# Patient Record
Sex: Female | Born: 1975 | Race: White | Hispanic: No | Marital: Single | Smoking: Current every day smoker
Health system: Southern US, Community
[De-identification: ages and names within clinical notes are randomized; demographics above are authoritative.]

## PROBLEM LIST (undated history)

## (undated) ENCOUNTER — Emergency Department (HOSPITAL_COMMUNITY): Payer: 59

## (undated) DIAGNOSIS — E079 Disorder of thyroid, unspecified: Secondary | ICD-10-CM

## (undated) DIAGNOSIS — B009 Herpesviral infection, unspecified: Secondary | ICD-10-CM

## (undated) DIAGNOSIS — E039 Hypothyroidism, unspecified: Secondary | ICD-10-CM

## (undated) DIAGNOSIS — F319 Bipolar disorder, unspecified: Secondary | ICD-10-CM

## (undated) DIAGNOSIS — F32A Depression, unspecified: Secondary | ICD-10-CM

## (undated) DIAGNOSIS — Z87891 Personal history of nicotine dependence: Secondary | ICD-10-CM

## (undated) DIAGNOSIS — F329 Major depressive disorder, single episode, unspecified: Secondary | ICD-10-CM

## (undated) DIAGNOSIS — F410 Panic disorder [episodic paroxysmal anxiety] without agoraphobia: Secondary | ICD-10-CM

## (undated) HISTORY — PX: OVARIAN CYST SURGERY: SHX726

## (undated) HISTORY — PX: ADENOIDECTOMY: SUR15

## (undated) HISTORY — PX: TONSILLECTOMY: SUR1361

## (undated) HISTORY — DX: Herpesviral infection, unspecified: B00.9

---

## 1997-10-19 ENCOUNTER — Other Ambulatory Visit: Admission: RE | Admit: 1997-10-19 | Discharge: 1997-10-19 | Payer: Self-pay | Admitting: Gynecology

## 1998-01-08 ENCOUNTER — Encounter: Payer: Self-pay | Admitting: Emergency Medicine

## 1998-01-08 ENCOUNTER — Emergency Department (HOSPITAL_COMMUNITY): Admission: EM | Admit: 1998-01-08 | Discharge: 1998-01-08 | Payer: Self-pay | Admitting: Emergency Medicine

## 1998-06-04 ENCOUNTER — Encounter: Admission: RE | Admit: 1998-06-04 | Discharge: 1998-06-04 | Payer: Self-pay | Admitting: Family Medicine

## 1998-09-15 ENCOUNTER — Emergency Department (HOSPITAL_COMMUNITY): Admission: EM | Admit: 1998-09-15 | Discharge: 1998-09-15 | Payer: Self-pay | Admitting: Emergency Medicine

## 1998-11-07 ENCOUNTER — Other Ambulatory Visit: Admission: RE | Admit: 1998-11-07 | Discharge: 1998-11-07 | Payer: Self-pay | Admitting: Gynecology

## 1999-02-22 ENCOUNTER — Encounter: Admission: RE | Admit: 1999-02-22 | Discharge: 1999-02-22 | Payer: Self-pay | Admitting: Family Medicine

## 1999-04-02 ENCOUNTER — Encounter: Admission: RE | Admit: 1999-04-02 | Discharge: 1999-04-02 | Payer: Self-pay | Admitting: Sports Medicine

## 1999-04-09 ENCOUNTER — Encounter: Admission: RE | Admit: 1999-04-09 | Discharge: 1999-04-09 | Payer: Self-pay | Admitting: Sports Medicine

## 1999-04-22 ENCOUNTER — Encounter: Admission: RE | Admit: 1999-04-22 | Discharge: 1999-04-22 | Payer: Self-pay | Admitting: Family Medicine

## 2000-03-11 ENCOUNTER — Encounter: Admission: RE | Admit: 2000-03-11 | Discharge: 2000-03-11 | Payer: Self-pay | Admitting: Family Medicine

## 2000-09-04 ENCOUNTER — Encounter: Admission: RE | Admit: 2000-09-04 | Discharge: 2000-09-04 | Payer: Self-pay | Admitting: Family Medicine

## 2001-04-09 ENCOUNTER — Encounter: Admission: RE | Admit: 2001-04-09 | Discharge: 2001-04-09 | Payer: Self-pay | Admitting: Family Medicine

## 2001-05-06 ENCOUNTER — Encounter: Admission: RE | Admit: 2001-05-06 | Discharge: 2001-05-06 | Payer: Self-pay | Admitting: Family Medicine

## 2001-05-14 ENCOUNTER — Ambulatory Visit (HOSPITAL_COMMUNITY): Admission: RE | Admit: 2001-05-14 | Discharge: 2001-05-14 | Payer: Self-pay | Admitting: Family Medicine

## 2001-05-27 ENCOUNTER — Encounter: Admission: RE | Admit: 2001-05-27 | Discharge: 2001-05-27 | Payer: Self-pay | Admitting: Family Medicine

## 2001-06-08 ENCOUNTER — Encounter: Admission: RE | Admit: 2001-06-08 | Discharge: 2001-06-08 | Payer: Self-pay | Admitting: Sports Medicine

## 2001-06-08 ENCOUNTER — Emergency Department (HOSPITAL_COMMUNITY): Admission: EM | Admit: 2001-06-08 | Discharge: 2001-06-08 | Payer: Self-pay | Admitting: Emergency Medicine

## 2001-06-28 ENCOUNTER — Inpatient Hospital Stay (HOSPITAL_COMMUNITY): Admission: AD | Admit: 2001-06-28 | Discharge: 2001-06-28 | Payer: Self-pay | Admitting: *Deleted

## 2001-06-29 ENCOUNTER — Ambulatory Visit (HOSPITAL_COMMUNITY): Admission: RE | Admit: 2001-06-29 | Discharge: 2001-06-29 | Payer: Self-pay | Admitting: Sports Medicine

## 2001-07-27 ENCOUNTER — Encounter: Admission: RE | Admit: 2001-07-27 | Discharge: 2001-07-27 | Payer: Self-pay | Admitting: Family Medicine

## 2001-08-11 ENCOUNTER — Encounter: Admission: RE | Admit: 2001-08-11 | Discharge: 2001-08-11 | Payer: Self-pay | Admitting: Sports Medicine

## 2001-08-18 ENCOUNTER — Encounter: Admission: RE | Admit: 2001-08-18 | Discharge: 2001-08-18 | Payer: Self-pay | Admitting: Family Medicine

## 2001-09-01 ENCOUNTER — Encounter: Admission: RE | Admit: 2001-09-01 | Discharge: 2001-09-01 | Payer: Self-pay | Admitting: Family Medicine

## 2001-09-17 ENCOUNTER — Encounter: Admission: RE | Admit: 2001-09-17 | Discharge: 2001-09-17 | Payer: Self-pay | Admitting: Family Medicine

## 2001-09-29 ENCOUNTER — Inpatient Hospital Stay (HOSPITAL_COMMUNITY): Admission: AD | Admit: 2001-09-29 | Discharge: 2001-09-29 | Payer: Self-pay | Admitting: Obstetrics and Gynecology

## 2001-09-29 ENCOUNTER — Encounter: Admission: RE | Admit: 2001-09-29 | Discharge: 2001-09-29 | Payer: Self-pay | Admitting: Family Medicine

## 2001-10-14 ENCOUNTER — Encounter: Admission: RE | Admit: 2001-10-14 | Discharge: 2001-10-14 | Payer: Self-pay | Admitting: Family Medicine

## 2001-10-19 ENCOUNTER — Encounter: Admission: RE | Admit: 2001-10-19 | Discharge: 2001-10-19 | Payer: Self-pay | Admitting: Family Medicine

## 2001-10-29 ENCOUNTER — Encounter: Admission: RE | Admit: 2001-10-29 | Discharge: 2001-10-29 | Payer: Self-pay | Admitting: Family Medicine

## 2001-11-08 ENCOUNTER — Encounter: Admission: RE | Admit: 2001-11-08 | Discharge: 2001-11-08 | Payer: Self-pay | Admitting: Family Medicine

## 2001-11-18 ENCOUNTER — Inpatient Hospital Stay (HOSPITAL_COMMUNITY): Admission: AD | Admit: 2001-11-18 | Discharge: 2001-11-18 | Payer: Self-pay | Admitting: *Deleted

## 2001-11-18 ENCOUNTER — Encounter: Admission: RE | Admit: 2001-11-18 | Discharge: 2001-11-18 | Payer: Self-pay | Admitting: Sports Medicine

## 2001-11-22 ENCOUNTER — Inpatient Hospital Stay (HOSPITAL_COMMUNITY): Admission: RE | Admit: 2001-11-22 | Discharge: 2001-11-24 | Payer: Self-pay | Admitting: *Deleted

## 2001-11-29 ENCOUNTER — Encounter: Admission: RE | Admit: 2001-11-29 | Discharge: 2001-11-29 | Payer: Self-pay | Admitting: Sports Medicine

## 2001-12-28 ENCOUNTER — Encounter: Admission: RE | Admit: 2001-12-28 | Discharge: 2001-12-28 | Payer: Self-pay | Admitting: Family Medicine

## 2002-04-04 ENCOUNTER — Encounter: Admission: RE | Admit: 2002-04-04 | Discharge: 2002-04-04 | Payer: Self-pay | Admitting: Family Medicine

## 2002-04-08 ENCOUNTER — Encounter: Payer: Self-pay | Admitting: Sports Medicine

## 2002-04-08 ENCOUNTER — Ambulatory Visit (HOSPITAL_COMMUNITY): Admission: RE | Admit: 2002-04-08 | Discharge: 2002-04-08 | Payer: Self-pay | Admitting: Sports Medicine

## 2002-04-26 ENCOUNTER — Encounter: Admission: RE | Admit: 2002-04-26 | Discharge: 2002-04-26 | Payer: Self-pay | Admitting: Obstetrics and Gynecology

## 2002-04-28 ENCOUNTER — Ambulatory Visit (HOSPITAL_COMMUNITY): Admission: RE | Admit: 2002-04-28 | Discharge: 2002-04-28 | Payer: Self-pay | Admitting: *Deleted

## 2002-06-02 ENCOUNTER — Encounter (INDEPENDENT_AMBULATORY_CARE_PROVIDER_SITE_OTHER): Payer: Self-pay | Admitting: *Deleted

## 2002-06-02 ENCOUNTER — Inpatient Hospital Stay (HOSPITAL_COMMUNITY): Admission: RE | Admit: 2002-06-02 | Discharge: 2002-06-04 | Payer: Self-pay | Admitting: Obstetrics and Gynecology

## 2002-06-06 ENCOUNTER — Inpatient Hospital Stay (HOSPITAL_COMMUNITY): Admission: AD | Admit: 2002-06-06 | Discharge: 2002-06-06 | Payer: Self-pay | Admitting: Obstetrics and Gynecology

## 2002-07-07 ENCOUNTER — Encounter: Admission: RE | Admit: 2002-07-07 | Discharge: 2002-07-07 | Payer: Self-pay | Admitting: Obstetrics and Gynecology

## 2003-05-04 ENCOUNTER — Encounter: Admission: RE | Admit: 2003-05-04 | Discharge: 2003-05-04 | Payer: Self-pay | Admitting: Sports Medicine

## 2003-05-15 ENCOUNTER — Ambulatory Visit (HOSPITAL_COMMUNITY): Admission: RE | Admit: 2003-05-15 | Discharge: 2003-05-15 | Payer: Self-pay | Admitting: *Deleted

## 2003-05-15 ENCOUNTER — Ambulatory Visit (HOSPITAL_BASED_OUTPATIENT_CLINIC_OR_DEPARTMENT_OTHER): Admission: RE | Admit: 2003-05-15 | Discharge: 2003-05-15 | Payer: Self-pay | Admitting: *Deleted

## 2003-05-15 ENCOUNTER — Encounter (INDEPENDENT_AMBULATORY_CARE_PROVIDER_SITE_OTHER): Payer: Self-pay | Admitting: *Deleted

## 2003-05-19 ENCOUNTER — Emergency Department (HOSPITAL_COMMUNITY): Admission: EM | Admit: 2003-05-19 | Discharge: 2003-05-20 | Payer: Self-pay

## 2003-05-23 ENCOUNTER — Emergency Department (HOSPITAL_COMMUNITY): Admission: EM | Admit: 2003-05-23 | Discharge: 2003-05-24 | Payer: Self-pay | Admitting: Emergency Medicine

## 2003-05-24 ENCOUNTER — Encounter: Admission: RE | Admit: 2003-05-24 | Discharge: 2003-05-24 | Payer: Self-pay | Admitting: Family Medicine

## 2003-06-12 ENCOUNTER — Encounter: Admission: RE | Admit: 2003-06-12 | Discharge: 2003-06-12 | Payer: Self-pay | Admitting: Family Medicine

## 2003-06-14 ENCOUNTER — Encounter: Admission: RE | Admit: 2003-06-14 | Discharge: 2003-06-14 | Payer: Self-pay | Admitting: Sports Medicine

## 2003-07-06 ENCOUNTER — Encounter: Admission: RE | Admit: 2003-07-06 | Discharge: 2003-07-06 | Payer: Self-pay | Admitting: Family Medicine

## 2003-07-31 ENCOUNTER — Encounter: Admission: RE | Admit: 2003-07-31 | Discharge: 2003-07-31 | Payer: Self-pay | Admitting: Sports Medicine

## 2003-08-02 ENCOUNTER — Encounter: Admission: RE | Admit: 2003-08-02 | Discharge: 2003-08-02 | Payer: Self-pay | Admitting: Sports Medicine

## 2003-08-16 ENCOUNTER — Encounter: Admission: RE | Admit: 2003-08-16 | Discharge: 2003-08-16 | Payer: Self-pay | Admitting: Family Medicine

## 2003-09-19 ENCOUNTER — Encounter: Admission: RE | Admit: 2003-09-19 | Discharge: 2003-09-19 | Payer: Self-pay | Admitting: Family Medicine

## 2004-02-08 ENCOUNTER — Ambulatory Visit: Payer: Self-pay | Admitting: Family Medicine

## 2004-07-03 ENCOUNTER — Emergency Department (HOSPITAL_COMMUNITY): Admission: EM | Admit: 2004-07-03 | Discharge: 2004-07-03 | Payer: Self-pay | Admitting: Emergency Medicine

## 2004-07-04 ENCOUNTER — Ambulatory Visit (HOSPITAL_COMMUNITY): Admission: RE | Admit: 2004-07-04 | Discharge: 2004-07-04 | Payer: Self-pay | Admitting: Emergency Medicine

## 2004-07-09 ENCOUNTER — Encounter (INDEPENDENT_AMBULATORY_CARE_PROVIDER_SITE_OTHER): Payer: Self-pay | Admitting: *Deleted

## 2004-07-09 ENCOUNTER — Ambulatory Visit: Payer: Self-pay | Admitting: Family Medicine

## 2004-07-09 LAB — CONVERTED CEMR LAB

## 2004-07-18 ENCOUNTER — Ambulatory Visit: Payer: Self-pay | Admitting: Obstetrics & Gynecology

## 2004-08-02 ENCOUNTER — Ambulatory Visit: Payer: Self-pay | Admitting: Sports Medicine

## 2005-02-07 ENCOUNTER — Ambulatory Visit: Payer: Self-pay | Admitting: Family Medicine

## 2005-04-14 ENCOUNTER — Ambulatory Visit: Payer: Self-pay | Admitting: Sports Medicine

## 2005-10-15 ENCOUNTER — Ambulatory Visit: Payer: Self-pay | Admitting: Family Medicine

## 2005-10-21 ENCOUNTER — Encounter: Admission: RE | Admit: 2005-10-21 | Discharge: 2005-10-21 | Payer: Self-pay | Admitting: Sports Medicine

## 2005-10-22 ENCOUNTER — Ambulatory Visit: Payer: Self-pay | Admitting: Family Medicine

## 2005-11-06 ENCOUNTER — Ambulatory Visit: Payer: Self-pay | Admitting: Obstetrics & Gynecology

## 2005-11-06 ENCOUNTER — Encounter (INDEPENDENT_AMBULATORY_CARE_PROVIDER_SITE_OTHER): Payer: Self-pay | Admitting: Obstetrics & Gynecology

## 2005-11-22 ENCOUNTER — Emergency Department (HOSPITAL_COMMUNITY): Admission: EM | Admit: 2005-11-22 | Discharge: 2005-11-22 | Payer: Self-pay | Admitting: Emergency Medicine

## 2005-11-28 ENCOUNTER — Emergency Department (HOSPITAL_COMMUNITY): Admission: EM | Admit: 2005-11-28 | Discharge: 2005-11-28 | Payer: Self-pay | Admitting: Emergency Medicine

## 2006-04-02 DIAGNOSIS — J309 Allergic rhinitis, unspecified: Secondary | ICD-10-CM | POA: Insufficient documentation

## 2006-04-02 DIAGNOSIS — F17200 Nicotine dependence, unspecified, uncomplicated: Secondary | ICD-10-CM | POA: Insufficient documentation

## 2006-04-02 DIAGNOSIS — F319 Bipolar disorder, unspecified: Secondary | ICD-10-CM | POA: Insufficient documentation

## 2006-04-02 DIAGNOSIS — F312 Bipolar disorder, current episode manic severe with psychotic features: Secondary | ICD-10-CM | POA: Diagnosis present

## 2006-04-02 DIAGNOSIS — K219 Gastro-esophageal reflux disease without esophagitis: Secondary | ICD-10-CM | POA: Insufficient documentation

## 2006-04-02 DIAGNOSIS — N83209 Unspecified ovarian cyst, unspecified side: Secondary | ICD-10-CM | POA: Insufficient documentation

## 2006-04-02 DIAGNOSIS — F410 Panic disorder [episodic paroxysmal anxiety] without agoraphobia: Secondary | ICD-10-CM | POA: Insufficient documentation

## 2006-04-02 HISTORY — DX: Nicotine dependence, unspecified, uncomplicated: F17.200

## 2006-04-03 ENCOUNTER — Encounter (INDEPENDENT_AMBULATORY_CARE_PROVIDER_SITE_OTHER): Payer: Self-pay | Admitting: *Deleted

## 2006-08-05 ENCOUNTER — Encounter: Payer: Self-pay | Admitting: *Deleted

## 2006-11-11 ENCOUNTER — Encounter (INDEPENDENT_AMBULATORY_CARE_PROVIDER_SITE_OTHER): Payer: Self-pay | Admitting: *Deleted

## 2006-11-13 ENCOUNTER — Ambulatory Visit: Payer: Self-pay | Admitting: Family Medicine

## 2006-11-14 ENCOUNTER — Telehealth (INDEPENDENT_AMBULATORY_CARE_PROVIDER_SITE_OTHER): Payer: Self-pay | Admitting: Family Medicine

## 2006-12-25 ENCOUNTER — Emergency Department (HOSPITAL_COMMUNITY): Admission: EM | Admit: 2006-12-25 | Discharge: 2006-12-25 | Payer: Self-pay | Admitting: Family Medicine

## 2006-12-30 ENCOUNTER — Encounter (INDEPENDENT_AMBULATORY_CARE_PROVIDER_SITE_OTHER): Payer: Self-pay | Admitting: Family Medicine

## 2006-12-30 ENCOUNTER — Ambulatory Visit: Payer: Self-pay | Admitting: Family Medicine

## 2006-12-30 LAB — CONVERTED CEMR LAB
Chlamydia, DNA Probe: NEGATIVE
GC Probe Amp, Genital: NEGATIVE
Whiff Test: NEGATIVE

## 2007-01-04 ENCOUNTER — Emergency Department (HOSPITAL_COMMUNITY): Admission: EM | Admit: 2007-01-04 | Discharge: 2007-01-04 | Payer: Self-pay | Admitting: Family Medicine

## 2007-01-07 ENCOUNTER — Encounter (INDEPENDENT_AMBULATORY_CARE_PROVIDER_SITE_OTHER): Payer: Self-pay | Admitting: Family Medicine

## 2007-01-07 LAB — CONVERTED CEMR LAB: Pap Smear: NEGATIVE

## 2007-11-23 ENCOUNTER — Ambulatory Visit: Payer: Self-pay | Admitting: Family Medicine

## 2007-11-26 ENCOUNTER — Telehealth (INDEPENDENT_AMBULATORY_CARE_PROVIDER_SITE_OTHER): Payer: Self-pay | Admitting: *Deleted

## 2007-12-17 ENCOUNTER — Inpatient Hospital Stay (HOSPITAL_COMMUNITY): Admission: AD | Admit: 2007-12-17 | Discharge: 2007-12-22 | Payer: Self-pay | Admitting: Psychiatry

## 2007-12-17 ENCOUNTER — Ambulatory Visit: Payer: Self-pay | Admitting: Psychiatry

## 2008-01-11 ENCOUNTER — Emergency Department (HOSPITAL_COMMUNITY): Admission: EM | Admit: 2008-01-11 | Discharge: 2008-01-11 | Payer: Self-pay | Admitting: Emergency Medicine

## 2008-10-17 ENCOUNTER — Ambulatory Visit: Payer: Self-pay | Admitting: Family Medicine

## 2008-10-17 ENCOUNTER — Encounter: Payer: Self-pay | Admitting: Sports Medicine

## 2008-10-17 ENCOUNTER — Other Ambulatory Visit: Admission: RE | Admit: 2008-10-17 | Discharge: 2008-10-17 | Payer: Self-pay | Admitting: Sports Medicine

## 2008-10-17 LAB — CONVERTED CEMR LAB
ALT: 26 units/L (ref 0–35)
AST: 18 units/L (ref 0–37)
Albumin: 4.5 g/dL (ref 3.5–5.2)
Alkaline Phosphatase: 54 units/L (ref 39–117)
Amylase: 43 U/L (ref 0–105)
BUN: 9 mg/dL (ref 6–23)
Basophils Absolute: 0 10*3/uL (ref 0.0–0.1)
Basophils Relative: 0 % (ref 0–1)
Bilirubin Urine: NEGATIVE
Blood in Urine, dipstick: NEGATIVE
CO2: 21 meq/L (ref 19–32)
Calcium: 9.3 mg/dL (ref 8.4–10.5)
Chlamydia, DNA Probe: NEGATIVE
Chloride: 107 meq/L (ref 96–112)
Creatinine, Ser: 1 mg/dL (ref 0.40–1.20)
Eosinophils Absolute: 0.3 10*3/uL (ref 0.0–0.7)
Eosinophils Relative: 3 % (ref 0–5)
GC Probe Amp, Genital: NEGATIVE
Glucose, Bld: 83 mg/dL (ref 70–99)
Glucose, Urine, Semiquant: NEGATIVE
HCT: 39.3 % (ref 36.0–46.0)
Hemoglobin: 13.1 g/dL (ref 12.0–15.0)
Lipase: 25 U/L (ref 0–75)
Lymphocytes Relative: 41 % (ref 12–46)
Lymphs Abs: 4.2 10*3/uL — ABNORMAL HIGH (ref 0.7–4.0)
MCHC: 33.3 g/dL (ref 30.0–36.0)
MCV: 84.5 fL (ref 78.0–100.0)
Monocytes Absolute: 0.8 K/uL (ref 0.1–1.0)
Monocytes Relative: 8 % (ref 3–12)
Neutro Abs: 4.9 K/uL (ref 1.7–7.7)
Neutrophils Relative %: 48 % (ref 43–77)
Nitrite: NEGATIVE
Platelets: 302 10*3/uL (ref 150–400)
Potassium: 3.9 meq/L (ref 3.5–5.3)
Protein, U semiquant: 100
RBC: 4.65 M/uL (ref 3.87–5.11)
RDW: 12.8 % (ref 11.5–15.5)
Sodium: 142 meq/L (ref 135–145)
Specific Gravity, Urine: >=1.03
Total Bilirubin: 0.4 mg/dL (ref 0.3–1.2)
Total Protein: 7.5 g/dL (ref 6.0–8.3)
Urobilinogen, UA: 0.2
WBC: 10.2 10*3/uL (ref 4.0–10.5)
Whiff Test: NEGATIVE
pH: 6

## 2008-10-26 ENCOUNTER — Telehealth: Payer: Self-pay | Admitting: Sports Medicine

## 2008-11-21 ENCOUNTER — Ambulatory Visit: Payer: Self-pay | Admitting: Family Medicine

## 2008-12-06 ENCOUNTER — Emergency Department (HOSPITAL_COMMUNITY): Admission: EM | Admit: 2008-12-06 | Discharge: 2008-12-06 | Payer: Self-pay | Admitting: Family Medicine

## 2009-03-13 ENCOUNTER — Telehealth: Payer: Self-pay | Admitting: Sports Medicine

## 2009-05-31 ENCOUNTER — Ambulatory Visit: Payer: Self-pay | Admitting: Family Medicine

## 2009-11-29 ENCOUNTER — Ambulatory Visit: Payer: Self-pay | Admitting: Family Medicine

## 2009-11-29 ENCOUNTER — Other Ambulatory Visit: Admission: RE | Admit: 2009-11-29 | Discharge: 2009-11-29 | Payer: Self-pay | Admitting: Family Medicine

## 2009-11-29 DIAGNOSIS — D2330 Other benign neoplasm of skin of unspecified part of face: Secondary | ICD-10-CM | POA: Insufficient documentation

## 2009-11-29 LAB — CONVERTED CEMR LAB: Pap Smear: NEGATIVE

## 2009-12-10 ENCOUNTER — Encounter: Payer: Self-pay | Admitting: Sports Medicine

## 2009-12-17 ENCOUNTER — Ambulatory Visit: Payer: Self-pay | Admitting: Family Medicine

## 2009-12-17 ENCOUNTER — Encounter: Payer: Self-pay | Admitting: Sports Medicine

## 2009-12-17 LAB — CONVERTED CEMR LAB: Glucose, Bld: 82 mg/dL (ref 70–99)

## 2009-12-31 ENCOUNTER — Ambulatory Visit: Payer: Self-pay | Admitting: Family Medicine

## 2010-01-08 ENCOUNTER — Ambulatory Visit: Payer: Self-pay | Admitting: Family Medicine

## 2010-02-10 ENCOUNTER — Emergency Department (HOSPITAL_COMMUNITY)
Admission: EM | Admit: 2010-02-10 | Discharge: 2010-02-10 | Payer: Medicare Other | Source: Home / Self Care | Admitting: Emergency Medicine

## 2010-02-10 ENCOUNTER — Emergency Department (HOSPITAL_COMMUNITY)
Admission: EM | Admit: 2010-02-10 | Discharge: 2010-02-10 | Disposition: A | Discharge status: Other Acute Inpatient Hospital | Payer: Self-pay | Source: Home / Self Care | Admitting: Family Medicine

## 2010-02-18 LAB — COMPREHENSIVE METABOLIC PANEL
ALT: 15 U/L (ref 0–35)
AST: 16 U/L (ref 0–37)
Albumin: 3.7 g/dL (ref 3.5–5.2)
Alkaline Phosphatase: 51 U/L (ref 39–117)
BUN: 7 mg/dL (ref 6–23)
CO2: 24 mEq/L (ref 19–32)
Calcium: 8.6 mg/dL (ref 8.4–10.5)
Chloride: 104 mEq/L (ref 96–112)
Creatinine, Ser: 0.8 mg/dL (ref 0.4–1.2)
GFR calc Af Amer: >60 mL/min (ref >60)
GFR calc non Af Amer: >60 mL/min (ref >60)
Glucose, Bld: 85 mg/dL (ref 70–99)
Potassium: 3.7 mEq/L (ref 3.5–5.1)
Sodium: 136 mEq/L (ref 135–145)
Total Bilirubin: 0.4 mg/dL (ref 0.3–1.2)
Total Protein: 7 g/dL (ref 6.0–8.3)

## 2010-02-18 LAB — POCT URINALYSIS DIPSTICK
Bilirubin Urine: NEGATIVE
Nitrite: NEGATIVE
Protein, ur: NEGATIVE mg/dL
Specific Gravity, Urine: >=1.03 (ref 1.005–1.030)
Urine Glucose, Fasting: NEGATIVE mg/dL
Urobilinogen, UA: 0.2 mg/dL (ref 0.0–1.0)
pH: 5.5 (ref 5.0–8.0)

## 2010-02-18 LAB — DIFFERENTIAL
Basophils Absolute: 0 10*3/uL (ref 0.0–0.1)
Basophils Relative: 0 % (ref 0–1)
Eosinophils Absolute: 0.3 10*3/uL (ref 0.0–0.7)
Eosinophils Relative: 4 % (ref 0–5)
Lymphocytes Relative: 37 % (ref 12–46)
Lymphs Abs: 3.6 10*3/uL (ref 0.7–4.0)
Monocytes Absolute: 0.5 10*3/uL (ref 0.1–1.0)
Monocytes Relative: 5 % (ref 3–12)
Neutro Abs: 5.2 10*3/uL (ref 1.7–7.7)
Neutrophils Relative %: 54 % (ref 43–77)

## 2010-02-18 LAB — GC/CHLAMYDIA PROBE AMP, GENITAL
Chlamydia, DNA Probe: NEGATIVE
GC Probe Amp, Genital: NEGATIVE

## 2010-02-18 LAB — CBC
HCT: 37.3 % (ref 36.0–46.0)
Hemoglobin: 12.3 g/dL (ref 12.0–15.0)
MCH: 28.4 pg (ref 26.0–34.0)
MCHC: 33 g/dL (ref 30.0–36.0)
MCV: 86.1 fL (ref 78.0–100.0)
Platelets: 304 10*3/uL (ref 150–400)
RBC: 4.33 MIL/uL (ref 3.87–5.11)
RDW: 12.4 % (ref 11.5–15.5)
WBC: 9.7 10*3/uL (ref 4.0–10.5)

## 2010-02-18 LAB — LIPASE, BLOOD: Lipase: 25 U/L (ref 11–59)

## 2010-02-18 LAB — WET PREP, GENITAL
Clue Cells Wet Prep HPF POC: NONE SEEN
Trich, Wet Prep: NONE SEEN
Yeast Wet Prep HPF POC: NONE SEEN

## 2010-02-18 LAB — POCT PREGNANCY, URINE: Preg Test, Ur: NEGATIVE

## 2010-02-24 ENCOUNTER — Encounter: Payer: Self-pay | Admitting: *Deleted

## 2010-03-01 ENCOUNTER — Encounter: Payer: Self-pay | Admitting: Sports Medicine

## 2010-03-01 ENCOUNTER — Ambulatory Visit: Admission: RE | Admit: 2010-03-01 | Discharge: 2010-03-01 | Payer: Self-pay | Source: Home / Self Care

## 2010-03-01 LAB — CONVERTED CEMR LAB
Bilirubin Urine: NEGATIVE
Glucose, Urine, Semiquant: NEGATIVE
Ketones, urine, test strip: NEGATIVE
Nitrite: NEGATIVE
Protein, U semiquant: NEGATIVE
Specific Gravity, Urine: 1.025
Urobilinogen, UA: 0.2
WBC Urine, dipstick: NEGATIVE
pH: 6

## 2010-03-02 ENCOUNTER — Encounter: Payer: Self-pay | Admitting: Sports Medicine

## 2010-03-04 ENCOUNTER — Encounter
Admission: RE | Admit: 2010-03-04 | Discharge: 2010-03-04 | Payer: Self-pay | Source: Home / Self Care | Attending: Family Medicine | Admitting: Family Medicine

## 2010-03-05 NOTE — Assessment & Plan Note (Signed)
Summary: allergic rhinitis   Vital Signs:  Patient profile:   35 year old female Height:      64.6 inches Weight:      212.9 pounds BMI:     36.00 Temp:     98.3 degrees F oral Pulse rate:   92 / minute BP sitting:   144 / 82  (left arm) Cuff size:   large  Vitals Entered By: Gladstone Pih (May 31, 2009 1:39 PM) CC: C/O fever,diarrhea X 4 days Is Patient Diabetic? No Pain Assessment Patient in pain? no        Primary Care Provider:  Rodney Langton MD  CC:  C/O fever and diarrhea X 4 days.  History of Present Illness: 35yo F c/o fevers  Fever: x 4 days.  Highest temp 100.5.  Associated symptoms include seasonal allergy symptoms (e.g. itchy and watery eyes, sneezing and rhinorrhea).    Habits & Providers  Alcohol-Tobacco-Diet     Tobacco Status: current     Tobacco Counseling: to quit use of tobacco products     Cigarette Packs/Day: 0.5  Current Medications (verified): 1)  Caltrate 600+d Plus 600-400 Mg-Unit Tabs (Calcium Carbonate-Vit D-Min) .... Take 1 Tablet By Mouth Twice A Day  Allergies (verified): 1)  Penicillin  Review of Systems       No present N/V/diarrhea or rashes  Physical Exam  General:  VS Reviewed. Obese, non illl appearing, NAD.  Head:  no sinus ttp Eyes:  no injected conjuntiva or dischage Nose:  no discharge Mouth:  Oral mucosa and oropharynx without lesions or exudates.  Teeth in good repair. Neck:  supple, full ROM, no goiter or mass  Lungs:  Normal respiratory effort, chest expands symmetrically. Lungs are clear to auscultation, no crackles or wheezes. Heart:  Normal rate and regular rhythm. S1 and S2 normal without gallop, murmur, click, rub or other extra sounds. Skin:  no rashes or lesions nl color and turgor Psych:  pt very anxious and tangential during our concersation perseverating on other matters   Impression & Recommendations:  Problem # 1:  RHINITIS, ALLERGIC (ICD-477.9) Assessment Deteriorated  Pt afebrile  today and non ill appearing. Hx and exam more c/w seasonal allergies.  She is not currently on any medications. Advised to try OTC zyrtec and f/u with Dr. Karie Schwalbe as needed.  Orders: [facility]- Est Level  3 (16109)  Problem # 2:  BIPOLAR DISORDER (ICD-296.7) Assessment: Comment Only She has a very strange demeanor and affect.  She was not able to recall all the medications she is taking.  Her meds are being prescribed by a psychiatrist which she could not recall as well.  She has trouble focusing but does not seem manic at this time. Advised to f/u with Dr. Karie Schwalbe and bring all of her medications.  Complete Medication List: 1)  Caltrate 600+d Plus 600-400 Mg-unit Tabs (Calcium carbonate-vit d-min) .... Take 1 tablet by mouth twice a day  Patient Instructions: 1)  Go pick up some over the counter zyrtec and take it daily and set up an appt with Dr. Karie Schwalbe in the next few weeks and bring all of your medications.

## 2010-03-05 NOTE — Progress Notes (Signed)
Summary: triage  Phone Note Call from Patient Call back at Home Phone [phone]   Caller: Patient Summary of Call: Pt has really bad cough and coughing up stuff.  Asking to see if she can get some cough medicine called in.  Pt uses [facility] on [address]. Initial call taken by: Clydell Hakim,  March 13, 2009 2:26 PM  Follow-up for Phone Call        states she cannot get here as she has no ride. told her md would have to see here before rx meds. she can get a ride after we close & will go to UC Follow-up by: Golden Circle RN,  March 13, 2009 2:38 PM  Additional Follow-up for Phone Call Additional follow up Details #1::        Noted, to [facility] Additional Follow-up by: Rodney Langton MD,  March 13, 2009 3:06 PM

## 2010-03-05 NOTE — Assessment & Plan Note (Signed)
 Summary: cpe/pap,tcb   Vital Signs:  Patient profile:   35 year old female Height:      64.6 inches Weight:      230 pounds BMI:     38.89 Temp:     98.1 degrees F oral Pulse rate:   66 / minute BP sitting:   109 / 74  (right arm)  Vitals Entered By: Arland Morel (October 17, 2008 4:21 PM) CC: cpe/pap  ?uti Is Patient Diabetic? No Pain Assessment Patient in pain? yes     Location: lower back Intensity: 10 Type: throbbing   Primary Care Provider:  DEBBY PETTIES MD  CC:  cpe/pap  ?uti.  History of Present Illness: Veronica Perez here for CPE/PAP also with c/o dysuria  Dysuria:  several days now, associated with dysuria, frequency, urgency, minimal clear vaginal discharge.  She does complain of pelvic pain and nausea.  No fevers/chills, flank pain, diarrhea, constipation.  Thinks she may be close to menstruation.  Pt states recently separated from husband who she found to be cheating on her.  Very distraught.  No SI/HI.  Habits & Providers  Alcohol-Tobacco-Diet     Tobacco Status: current     Cigarette Packs/Day: 0.5  Past History:  Past Medical History: Last updated: 11/13/2006 07/2003 resolved L ovarian complex cyst,  possible, Bilateral large ovarian cysts 3/04 - s/p resection,  hydrosalpinix, new 2cm multiseptat.cyst on R ovary, Regular menses (5 days),  G1P1001 NSVD female infant 10/03,  GC/Chlam neg 6/06,  hospitalized [facility] for MANIA schizoaffective disorder  Past Surgical History: Last updated: 11/13/2006 and lower uterine segment - 06/19/2003, and small calcification junction cervix - 06/19/2003,  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 ETOH on weekends. Occasional THC. No other drugs. 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 No apparent alopecia or balding. Eyes:  No corneal or conjunctival inflammation noted. EOMI. Perrla. Ears:  External ear exam shows no significant lesions or deformities.   Nose:  External nasal examination shows no deformity or inflammation. Mouth:  Oral mucosa and oropharynx without lesions or exudates.  Teeth in good repair. Neck:  No deformities, masses, or tenderness noted. Chest Wall:  No deformities, masses, or tenderness noted. Breasts:  No mass, nodules, thickening, tenderness, bulging, retraction, inflamation, nipple discharge or skin changes noted.   Lungs:  Normal respiratory effort, chest expands symmetrically. Lungs are clear to auscultation, no crackles or wheezes. Heart:  Normal rate and regular rhythm. S1 and S2 normal without gallop, murmur, click, rub or other extra sounds. Abdomen:  Bowel sounds positive,abdomen soft and without masses, organomegaly or hernias noted.  TTP lower quadrants, no rebound. Genitalia:  Normal introitus for age, several genital warts noted, no vaginal discharge, mucosa pink and moist, no vaginal or cervical lesions, no vaginal  atrophy, no friaility or hemorrhage, normal uterus size and position.  Tender suprapubic, also tender in LLQ/RLQ.  Extremities:  No clubbing, cyanosis, edema, or deformity noted with normal full range of motion of all joints.   Skin:  Intact without suspicious  lesions or rashes Cervical Nodes:  No lymphadenopathy noted   Impression & Recommendations:  Problem # 1:  DYSURIA (ICD-788.1) Assessment New UA suggestive of UTI.  Will tx with 7d course keflex .  Awaiting UCx.  Had only a drop left to send for Cx.  Her updated medication list for this problem includes:    Keflex  500 Mg Caps (Cephalexin ) ..... One tab by mouth two times a day x 7 days  Orders: Urinalysis-[facility] (00000) CBC w/Diff-[facility] ([id number]) [facility]- Est Level  3 (00786) Urine Culture-[facility] ([id number])  Problem # 2:  NAUSEA (ICD-787.02) Assessment: New Dysuria also associated with nausea.  With large amt bili in urine will need to also check LFTs, amylase, lipase to r/o other intraabdominal pathology.   Orders: Comp Met-[facility] [id number]) CBC w/Diff-[facility] [phone]) Amylase-[facility] [phone]) Lipase-[facility] [id number]) [facility]- Est Level  3 (00786)  Problem # 3:  PELVIC PAIN 625.9 (ICD-625.9) Assessment: Deteriorated Seems she has had this before but no diagnosis.  Dysuria with pelvic pain also associated with nausea.  With large amt bili in urine will need to also check LFTs, amylase, lipase to r/o other intraabdominal pathology.  If all negative and still with pain after bx and TOC UA then will consider US  pelvis.  Orders: Comp Met-[facility] [id number]) CBC w/Diff-[facility] [id number]) Amylase-[facility] [id number]) Lipase-[facility] [id number]) [facility]- Est Level  3 (00786)  Complete Medication List: 1)  Caltrate 600+d Plus 600-400 Mg-unit Tabs (Calcium carbonate-vit d-min) .... Take 1 tablet by mouth twice a day 2)  Hydrocortisone  2.5 % Oint (Hydrocortisone ) .... Apply to affected area on hand 3 times a day 3)  Keflex  500 Mg Caps (Cephalexin ) .... One tab by mouth two times a day x 7 days  Other Orders: GC/Chlamydia-[facility] ([id number]) [facility] Pap Smear-[facility] ([id number])  Patient Instructions: 1)  Great to see you today, 2)  I will check some labwork. 3)  Some of the tests will take a few days  to come back. 4)  In the meantime, it looks like you have a UTI so I will put you on a course of antibiotics. 5)  I want you to come back to see me if you haven't had any improvement in 2 weeks, or if you start to have fevers/chills/vomiting/side pain. 6)  -Dr. ONEIDA. Prescriptions: KEFLEX  500 MG CAPS (CEPHALEXIN ) One tab by mouth two times a day x 7 days  #14 x 0   Entered and Authorized by:   Debby Petties MD   Signed by:   Debby Petties MD on 10/17/2008   Method used:   Electronically to        [facility]  [address] [facility] [id number] (retail)       [address]       [place], [place]  [address]       Ph: [phone] or [phone]       Fax: [phone]   RxID:   [id number]     Laboratory Results   Urine Tests  Date/Time Received: October 17, 2008 4:24 PM  Date/Time Reported: October 17, 2008 5:44 PM   Routine Urinalysis   Color: yellow Appearance: QNS to determine clarity Glucose: negative   (Normal Range: Negative) Bilirubin:  moderate;  reflex to  ictotest = negative   (Normal Range: Negative) Ketone: small (15)   (Normal Range: Negative) Spec. Gravity: >=1.030   (Normal Range: 1.003-1.035) Blood: negative   (Normal Range: Negative) pH: 6.0   (Normal Range: 5.0-8.0) Protein: 100   (Normal Range: Negative) Urobilinogen: 0.2   (Normal Range: 0-1) Nitrite: negative   (Normal Range: Negative) Leukocyte Esterace: small   (Normal Range: Negative)    Comments: QNS for microscopic,  urine cultured ...............test performed by......SABRABonnie A. Jordan, MT (ASCP)  Date/Time Received: October 17, 2008 4:24 PM  Date/Time Reported: October 17, 2008 5:42 PM   [place]et [place] Source: vag WBC/hpf: >20 Bacteria/hpf: 2+  Rods Clue cells/hpf: none  Negative whiff Yeast/hpf: none Trichomonas/hpf: none Comments: RBC's present ...............test performed by......SABRABonnie A. Jordan, MT (ASCP)

## 2010-03-05 NOTE — Miscellaneous (Signed)
  Clinical Lists Changes  Problems: Removed problem of SCREENING FOR MALIGNANT NEOPLASM OF THE CERVIX (ICD-V76.2) Removed problem of NEED PROPHYLACTIC VACCINATION&INOCULATION FLU (ICD-V04.81) Removed problem of ROUTINE GYNECOLOGICAL EXAMINATION (ICD-V72.31) Removed problem of VAGINAL DISCHARGE (ICD-623.5) Removed problem of SCREENING FOR MALIGNANT NEOPLASM OF THE CERVIX (ICD-V76.2) Removed problem of PELVIC PAIN 625.9 (ICD-625.9) Removed problem of MENSTRUATION, PAINFUL (ICD-625.3) Removed problem of DSORD BIPOLAR I SNGL MANIC EPISODE, UNSPC (ICD-296.00)

## 2010-03-05 NOTE — Assessment & Plan Note (Signed)
Summary: cpe,df   Vital Signs:  Patient profile:   35 year old female Height:      64.6 inches Weight:      222.5 pounds BMI:     37.62 Temp:     98.3 degrees F oral Pulse rate:   74 / minute BP sitting:   123 / 82  (left arm) Cuff size:   regular  Vitals Entered By: Garen Grams LPN (November 29, 2009 4:08 PM) CC: CPE Is Patient Diabetic? No Pain Assessment Patient in pain? no        Primary Care Bobak Oguinn:  Rodney Langton MD  CC:  CPE.  History of Present Illness: Here to CPE and PAP.  Noted mole on face she would like removed.  Habits & Providers  Alcohol-Tobacco-Diet     Tobacco Status: current     Tobacco Counseling: to quit use of tobacco products     Cigarette Packs/Day: 0.5  Current Medications (verified): 1)  Caltrate 600+d Plus 600-400 Mg-Unit Tabs (Calcium Carbonate-Vit D-Min) .... Take 1 Tablet By Mouth Twice A Day  Allergies (verified): 1)  Penicillin  Review of Systems       See HPI  Physical Exam  General:  Well-developed,well-nourished,in no acute distress; alert,appropriate and cooperative throughout examination Head:  Normocephalic and atraumatic without obvious abnormalities. No apparent alopecia or balding. Eyes:  No corneal or conjunctival inflammation noted. EOMI. Perrl Ears:  External ear exam shows no significant lesions or deformities.  Otoscopic examination reveals clear canals, tympanic membranes are intact bilaterally without bulging, retraction, inflammation or discharge. Hearing is grossly normal bilaterally. Nose:  External nasal examination shows no deformity or inflammation. Nasal mucosa are pink and moist without lesions or exudates. Mouth:  Oral mucosa and oropharynx without lesions or exudates.  Teeth in good repair. Neck:  No deformities, masses, or tenderness noted. Lungs:  Normal respiratory effort, chest expands symmetrically. Lungs are clear to auscultation, no crackles or wheezes. Heart:  Normal rate and regular  rhythm. S1 and S2 normal without gallop, murmur, click, rub or other extra sounds. Abdomen:  Bowel sounds positive,abdomen soft and non-tender without masses, organomegaly or hernias noted. Genitalia:  Normal introitus for age, no external lesions, no vaginal discharge, mucosa pink and moist, no vaginal or cervical lesions, no vaginal atrophy, no friaility or hemorrhage, normal uterus size and position, no adnexal masses or tenderness.  Starting menstruation. Msk:  No deformity or scoliosis noted of thoracic or lumbar spine.   Pulses:  R and L carotid,radial,femoral,dorsalis pedis and posterior tibial pulses are full and equal bilaterally Extremities:  No clubbing, cyanosis, edema, or deformity noted with normal full range of motion of all joints.   Neurologic:  No cranial nerve deficits noted. Station and gait are normal. Plantar reflexes are down-going bilaterally. DTRs are symmetrical throughout. Sensory, motor and coordinative functions appear intact. Skin:  0.5 cm nodule present on L cheek just lateral to nasolabial fold.   Impression & Recommendations:  Problem # 1:  ROUTINE GYNECOLOGICAL EXAMINATION (ICD-V72.31) Assessment Unchanged Normal exam. PAP done.  Orders: [facility] - Est  18-39 yrs (51884)  Problem # 2:  NEED PROPHYLACTIC VACCINATION&INOCULATION FLU (ICD-V04.81) Assessment: Unchanged Flu shot given.  Orders: [facility] - Est  18-39 yrs (16606)  Problem # 3:  SCREENING FOR MALIGNANT NEOPLASM OF THE CERVIX (ICD-V76.2) Assessment: Unchanged Pap done.  Orders: [facility] - Est  18-39 yrs (30160) Pap Smear-[facility] ([id number])  Problem # 4:  BENIGN NEOPLASM SKIN OTHER&UNSPEC PARTS FACE (ICD-216.3) Assessment: New Can remove  mole at next visit with shave and hyfrecator.  Complete Medication List: 1)  Caltrate 600+d Plus 600-400 Mg-unit Tabs (Calcium carbonate-vit d-min) .... Take 1 tablet by mouth twice a day  Other Orders: Flu Vaccine 56yrs + (60454) Admin 1st Vaccine  (09811)  Patient Instructions: 1)  Good to see you, 2)  Come back to discuss removal of moles on face. 3)  -Dr. Karie Schwalbe.   Orders Added: 1)  [facility] - Est  18-39 yrs [99395] 2)  Pap Smear-[facility] [[id number]] 3)  Flu Vaccine 3yrs + [90658] 4)  Admin 1st Vaccine [90471]   Immunizations Administered:  Influenza Vaccine # 1:    Vaccine Type: Fluvax 3+    Site: right deltoid    Mfr: GlaxoSmithKline    Dose: 0.5 ml    Route: IM    Given by: Garen Grams LPN    Exp. Date: 07/31/2010    Lot #: [id number]    VIS given: 08/28/09 version given November 29, 2009.  Flu Vaccine Consent Questions:    Do you have a history of severe allergic reactions to this vaccine? no    Any prior history of allergic reactions to egg and/or gelatin? no    Do you have a sensitivity to the preservative Thimersol? no    Do you have a past history of Guillan-Barre Syndrome? no    Do you currently have an acute febrile illness? no    Have you ever had a severe reaction to latex? no    Vaccine information given and explained to patient? yes    Are you currently pregnant? no   Immunizations Administered:  Influenza Vaccine # 1:    Vaccine Type: Fluvax 3+    Site: right deltoid    Mfr: GlaxoSmithKline    Dose: 0.5 ml    Route: IM    Given by: Garen Grams LPN    Exp. Date: 07/31/2010    Lot #: [id number]    VIS given: 08/28/09 version given November 29, 2009.

## 2010-03-05 NOTE — Progress Notes (Signed)
 Summary: test results  Phone Note Call from Patient Call back at [phone]   Caller: Patient Summary of Call: is wanting test results Initial call taken by: Karna Seminole,  October 26, 2008 4:00 PM  Follow-up for Phone Call        All results were negative including GC/Chlam, CBC, CMET, amylase, lipase, PAP, Urine culture showed UTI.  Finish course of Keflex .  Come back to see me 2 weeks after last visit if still symptomatic otherwise can RTC in a year. Follow-up by: Debby Petties MD,  October 26, 2008 5:33 PM     Appended Document: test results her number has been d/c. called other number & left message with her dad to call me back  Appended Document: test results told her what md notes said about results of tests. states her uti is better & she took all the med

## 2010-03-05 NOTE — Assessment & Plan Note (Signed)
Summary: F/U  KH   Vital Signs:  Patient profile:   35 year old female Weight:      215 pounds Temp:     98 degrees F oral Pulse rate:   98 / minute Pulse rhythm:   regular BP sitting:   131 / 86  (left arm) Cuff size:   large  Vitals Entered By: Loralee Pacas CMA (January 08, 2010 9:09 AM)  Primary Care Hartwell Vandiver:  Rodney Langton MD   History of Present Illness: Pt here to fu removal of mole from face.  She is pleased with the result and has not had any pain.  Scab came off but otherwise no erythema, no pain, no drainage.  Current Medications (verified): 1)  Caltrate 600+d Plus 600-400 Mg-Unit Tabs (Calcium Carbonate-Vit D-Min) .... Take 1 Tablet By Mouth Twice A Day  Allergies (verified): 1)  Penicillin  Review of Systems       See HPI  Physical Exam  General:  Well-developed,well-nourished,in no acute distress; alert,appropriate and cooperative throughout examination Skin:  Area where mole removed healing well, mildly hyperemic but no warmth, induration, drainage.  Excellent cosmetic result.   Impression & Recommendations:  Problem # 1:  BENIGN NEOPLASM SKIN OTHER&UNSPEC PARTS FACE (ICD-216.3) Assessment Improved  Excellent cosmesis, pt very satisfied. Pt to RTC as needed.  Orders: [facility]- Est Level  3 (99213)  Complete Medication List: 1)  Caltrate 600+d Plus 600-400 Mg-unit Tabs (Calcium carbonate-vit d-min) .... Take 1 tablet by mouth twice a day   Orders Added: 1)  [facility]- Est Level  3 [11914]

## 2010-03-05 NOTE — Assessment & Plan Note (Signed)
Summary: MOLE REMOVAL/KH   Vital Signs:  Patient profile:   35 year old female Height:      64.6 inches Weight:      226 pounds BMI:     38.21 Temp:     98.6 degrees F oral Pulse rate:   73 / minute BP sitting:   118 / 76  (right arm) Cuff size:   large  Vitals Entered By: Jimmy Footman, CMA (December 17, 2009 11:13 AM) CC: mole removal/ face Is Patient Diabetic? No Pain Assessment Patient in pain? no        Primary Care Knight Oelkers:  Rodney Langton MD  CC:  mole removal/ face.  History of Present Illness: 35 yo female comes in fot removal of mole on face.  Also would like to have blood glucose checked. Endorses fatigue.  Habits & Providers  Alcohol-Tobacco-Diet     Tobacco Status: current  Current Medications (verified): 1)  Caltrate 600+d Plus 600-400 Mg-Unit Tabs (Calcium Carbonate-Vit D-Min) .... Take 1 Tablet By Mouth Twice A Day  Allergies (verified): 1)  Penicillin  Review of Systems       See HPI  Physical Exam  General:  Well-developed,well-nourished,in no acute distress; alert,appropriate and cooperative throughout examination Additional Exam:  Procedure: removal of mole on left cheek, just lateral to nasolabial fold. Consent obtained Timeout conducted. 1cc of lidocaine with epi infiltrated under mole. Adequate anesthesia ensured. Area cleaned with alcohol.  Under magnification with loupes, forceps used to lift lesion and #11 blade used to shave off lesion at skin level. Hyfrecator used on setting 4 to cauterize wound and smooth edges.  Hemostasis achieved Antibiotic ointment applied and bandaid placed . Red flags advised.   Impression & Recommendations:  Problem # 1:  BENIGN NEOPLASM SKIN OTHER&UNSPEC PARTS FACE (ICD-216.3) Assessment Improved Removed. lesion. Smoothed edges with hyfrecator. Pt to RTC 1 week to recheck wound.  Orders: Cryotherapy/Destruction benign or premalignant lesion (1st lesion)  (17000)  Problem # 2:  FATIGUE  (ICD-780.79) Assessment: New Checking CBG as requested by pt.  Orders: [facility]- Est Level  3 (99213) Glucose-[facility] ([id number])  Complete Medication List: 1)  Caltrate 600+d Plus 600-400 Mg-unit Tabs (Calcium carbonate-vit d-min) .... Take 1 tablet by mouth twice a day   Orders Added: 1)  Cryotherapy/Destruction benign or premalignant lesion (1st lesion)  [17000] 2)  [facility]- Est Level  3 [99213] 3)  Glucose-[facility] [[id number]]  Appended Document: MOLE REMOVAL/KH    Clinical Lists Changes  Orders: Added new Service order of Cryo/Destruc ben lesion, not skin tag or vascular; <14 lesions (17110) - Signed

## 2010-03-05 NOTE — Assessment & Plan Note (Signed)
Summary: fu remov/mj   Vitals Entered By: Loralee Pacas CMA (December 31, 2009 8:56 AM)  Primary Care Provider:  Rodney Langton MD   History of Present Illness: Pt here to fu removal of mole from face.  She is pleased with the result and has not had any pain.  She would like for me to remove some more.  Current Medications (verified): 1)  Caltrate 600+d Plus 600-400 Mg-Unit Tabs (Calcium Carbonate-Vit D-Min) .... Take 1 Tablet By Mouth Twice A Day  Allergies (verified): 1)  Penicillin  Review of Systems       See HPI  Physical Exam  General:  Well-developed,well-nourished,in no acute distress; alert,appropriate and cooperative throughout examination Skin:  0.5 cm nodule, flatter than previous visit,  present on L cheek just lateral to nasolabial fold. Additional Exam:  Procedure: repear removal of remnants of mole on left cheek, just lateral to nasolabial fold. Verbal consent obtained Timeout conducted. 1cc of lidocaine with epi infiltrated under mole. Adequate anesthesia ensured. Area cleaned with alcohol.  Under magnification with loupes, forceps used to lift lesion and #11 blade used to shave off lesion at skin level. Hyfrecator used on setting 4 to cauterize wound and smooth edges.  Hemostasis achieved Antibiotic ointment applied and bandaid placed . Red flags advised.   Impression & Recommendations:  Problem # 1:  BENIGN NEOPLASM SKIN OTHER&UNSPEC PARTS FACE (ICD-216.3) Assessment Improved Re-excised remnants of lesion. Smoothed edges with hyfrecator. Pt to RTC 1 week to recheck wound.  Complete Medication List: 1)  Caltrate 600+d Plus 600-400 Mg-unit Tabs (Calcium carbonate-vit d-min) .... Take 1 tablet by mouth twice a day

## 2010-03-05 NOTE — Assessment & Plan Note (Signed)
 Summary: cough, shortness of breath/ACM   Vital Signs:  Patient Profile:   35 Years Old Female Height:     64 inches Weight:      203 pounds O2 Sat:      98 % O2 treatment:    Room Air Temp:     98.1 degrees F Pulse rate:   91 / minute BP sitting:   116 / 77  Vitals Entered By: HARLENE CARTE CMA (November 23, 2007 2:15 PM)                  PCP:  DEBBY PETTIES MD  Chief Complaint:  cough and SOB.  History of Present Illness: 3 day history of non-productive cough, SOB, runny nose, sore throat, pleuritic pain in ribs, temperature to 100 deg F at home.  Also has some body aches, and muscle pains.  No HA, sinus pressure, N/V/D/C, dizziness, lightheadedness, syncope.     Past Medical History:    Reviewed history from 11/13/2006 and no changes required:       07/2003 resolved L ovarian complex cyst,        possible, Bilateral large ovarian cysts 3/04 - s/p resection,  hydrosalpinix, new 2cm multiseptat.cyst on R ovary, Regular menses (5 days),        G1P1001 NSVD female infant 10/03,        GC/Chlam neg 6/06,        hospitalized 2000 [facility] for MANIA       schizoaffective disorder  Past Surgical History:    Reviewed history from 11/13/2006 and no changes required:       and lower uterine segment - 06/19/2003, and small calcification junction cervix - 06/19/2003,        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 ETOH on weekends. Occasional THC. No  other drugs. 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 No apparent alopecia or balding. Eyes:     No corneal or conjunctival inflammation noted. EOMI. Perrla. Ears:     External ear exam shows no significant lesions or deformities.  Nose:     External nasal examination shows no deformity or inflammation. Nasal mucosa are pink and moist without lesions or exudates. Mouth:     Oral mucosa and oropharynx without lesions or exudates.  Teeth in good repair. Neck:     No deformities, masses,  or tenderness noted. Chest Wall:     TTP bilateral costal margins Lungs:     Normal respiratory effort, chest expands symmetrically. Lungs are clear to auscultation, no crackles or wheezes. Heart:     Normal rate and regular rhythm. S1 and S2 normal without gallop, murmur, click, rub or other extra sounds. Cervical Nodes:     No lymphadenopathy noted    Impression & Recommendations:  Problem # 1:  VIRAL URI (ICD-465.9) Symptoms consistent with Influenza-like illness.  Beyond 3 day window so no Tamiflu, will treat symptomatically with antitussives, antihistamines, anti-inflammatories for what appears to be costochondritis.  Pt to follow up as needed.  Her updated medication list for this problem includes:    Day-time Cold/flu Relief 10-5-325 Mg/15ml Liqd (Dm-phenylephrine-acetaminophen ) .[name]... Take as directed    All Day Pain Relief 220 Mg Tabs (Naproxen sodium) ..... One tab by mouth daily for rib pain  Orders: [facility]- Est Level  3 (99213)   Complete Medication List: 1)   Caltrate 600+d Plus 600-400 Mg-unit Tabs (Calcium carbonate-vit d-min) .... Take 1 tablet by mouth twice a day 2)  Hydrocortisone  2.5 % Oint (Hydrocortisone ) .... Apply to affected area on hand 3 times a day 3)  Day-time Cold/flu Relief 10-5-325 Mg/15ml Liqd (Dm-phenylephrine-acetaminophen ) .... Take as directed 4)  All Day Pain Relief 220 Mg Tabs (Naproxen sodium) .... One tab by mouth daily for rib pain   Patient Instructions: 1)  Good to see you again, take the meds as prescribed, it seems as though you have a viral upper respiratory infection.  This should clear up in 2 weeks.  Be sure to drink plenty of fluids! 2)  Come back in 2 weeks if there is no improvement in your symptoms, otherwise I will see you in 6 months. 3)  -Dr. ONEIDA.   Prescriptions: DAY-TIME COLD/FLU RELIEF 10-5-325 MG/15ML LIQD (DM-PHENYLEPHRINE-ACETAMINOPHEN ) take as directed  #31 x 6   Entered and Authorized by:   DEBBY PETTIES MD   Signed by:   DEBBY PETTIES MD on 11/23/2007   Method used:   Electronically to        [facility]  [address]* (retail)       [address]       [place], [place]  [address]       Ph: [phone]       Fax: [phone]   RxID:   [id number] ALL DAY PAIN RELIEF 220 MG TABS (NAPROXEN SODIUM) one tab by mouth daily for rib pain  #31 x 6   Entered and Authorized by:   DEBBY PETTIES MD   Signed by:   DEBBY PETTIES MD on 11/23/2007   Method used:   Electronically to        [facility]  [address] (retail)       [address]       [place], [place]  [address]       Ph: [phone]       Fax: [phone]   RxID:   [id number]  ]

## 2010-03-05 NOTE — Miscellaneous (Signed)
Summary: Procedures Consent  Procedures Consent   Imported By: De Nurse 12/21/2009 16:25:07  _____________________________________________________________________  External Attachment:    Type:   Image     Comment:   External Document

## 2010-03-07 NOTE — Letter (Signed)
Summary: Handout Printed  Printed Handout:  - Interstitial Cystitis

## 2010-03-07 NOTE — Assessment & Plan Note (Addendum)
Summary: SUPRAPUBIC PAIN/[facility]   Vital Signs:  Patient profile:   35 year old female Height:      64.6 inches Weight:      216 pounds Temp:     99.4 degrees F oral Pulse rate:   92 / minute Pulse rhythm:   regular BP sitting:   139 / 77  (left arm) Cuff size:   regular  Vitals Entered By: Loralee Pacas CMA (March 01, 2010 10:27 AM) CC: back pain Is Patient Diabetic? No Pain Assessment Patient in pain? yes     Location: back Intensity: 5 Type: numbness, tingling Onset of pain  Constant Comments feels like her kidneys are enlarged. she gets relief sometimes when she lifts up the loose skin that sits on her bladder. pt went to UC on 1/8 and was given an rx for pain but has not gotten it filled because she said its only temporary and she wants the pain to go away   Primary Care Provider:  Rodney Langton MD  CC:  back pain.  History of Present Illness: 35 yo female with pain.  Suprapubic pain:  Pt says she has been dealing with this for decades, pain subrapubic, present 24/7, worsened on the 8th of jan so went to ED.  CBC, BMET, Lipase, GC/chlam, all neg.  UA with high sp grav and small blood.  Pain also in R CVA, no radiation.  She gets sudden urges to void, frequency, inability to fully empty bladder.  No association with periods.  + nausea, no V/D/C.  She does have some incontinence with sneezing.  This causes her great distress however workups with other physicians have centered around her ovarian cysts and gynecologic imaging.  No fevers/chills.  Habits & Providers  Alcohol-Tobacco-Diet     Tobacco Status: current     Tobacco Counseling: to quit use of tobacco products     Cigarette Packs/Day: 0.5  Current Medications (verified): 1)  Caltrate 600+d Plus 600-400 Mg-Unit Tabs (Calcium Carbonate-Vit D-Min) .... Take 1 Tablet By Mouth Twice A Day 2)  Phenazopyridine Hcl 200 Mg Tabs (Phenazopyridine Hcl) .... One Tab By Mouth Two Times A Day X 3d  Allergies  (verified): 1)  Penicillin  Review of Systems       See HPI  Physical Exam  General:  Well-developed,well-nourished,in no acute distress; alert,appropriate and cooperative throughout examination Abdomen:  TTP suprapubic.  Mild R CVAT. Otherwise soft, no masses, good BS.   Impression & Recommendations:  Problem # 1:  SUPRAPUBIC PAIN (ICD-789.09) Assessment New Checking UA, UCx. Blood suggestive of nephrolith, no imaging done for this in the past, checking KUB and Renal US with post void residuals as she cannot completely empty bladder. Urge incontinence could be at play here with sudden urges. I am also concerned about interstitial cystitis with severe pain after voiding today for her UA.   Will give phenazopyridine today. If initial w/u neg can refer to urology for urodynamic studies to assess to dysfunctional voiding and possible cystoscopy to assess for punctate hemorrhages associate with interstitial cystisis.  Orders: Urinalysis-[facility] (00000) Urine Culture-[facility] ([id number]) Diagnostic X-Ray/Fluoroscopy (Diagnostic X-Ray/Flu) [facility]- Est  Level 4 (09811) Ultrasound (Ultrasound)  Complete Medication List: 1)  Caltrate 600+d Plus 600-400 Mg-unit Tabs (Calcium carbonate-vit d-min) .... Take 1 tablet by mouth twice a day 2)  Phenazopyridine Hcl 200 Mg Tabs (Phenazopyridine hcl) .... One tab by mouth two times a day x 3d  Patient Instructions: 1)  Checking urine, kidney ultrasound, xray of abdomen. 2)  Try phenazopyridine for now to numb up the urinary tract. 3)  If all workup negative, we can have you see a urologist to assess for interstitial cystitis. 4)  -Dr. Karie Schwalbe. Prescriptions: PHENAZOPYRIDINE HCL 200 MG TABS (PHENAZOPYRIDINE HCL) One tab by mouth two times a day x 3d  #6 x 0   Entered and Authorized by:   Rodney Langton MD   Signed by:   Rodney Langton MD on 03/01/2010   Method used:   Electronically to        [facility] (retail)       [address]       [place], [place]  [id number]       Ph: [phone]       Fax: [phone]   RxID:   [id number]    Orders Added: 1)  Urinalysis-[facility] [00000] 2)  Urine Culture-[facility] [[id number]] 3)  Diagnostic X-Ray/Fluoroscopy [Diagnostic X-Ray/Flu] 4)  [facility]- Est  Level 4 [36644] 5)  Ultrasound [Ultrasound]    Laboratory Results   Urine Tests  Date/Time Received: March 01, 2010 10:55 AM  Date/Time Reported: March 01, 2010 11:21 AM   Routine Urinalysis   Color: yellow Appearance: Clear Glucose: negative   (Normal Range: Negative) Bilirubin: negative   (Normal Range: Negative) Ketone: negative   (Normal Range: Negative) Spec. Gravity: 1.025   (Normal Range: 1.003-1.035) Blood: trace-intact   (Normal Range: Negative) pH: 6.0   (Normal Range: 5.0-8.0) Protein: negative   (Normal Range: Negative) Urobilinogen: 0.2   (Normal Range: 0-1) Nitrite: negative   (Normal Range: Negative) Leukocyte Esterace: negative   (Normal Range: Negative)  Urine Microscopic WBC/HPF: rare RBC/HPF: 0-3 Bacteria/HPF: 1+ Mucous/HPF: 1+ Epithelial/HPF: 1-5    Comments: urine sent for culture ...............test performed by......Marland KitchenBonnie A. [place], MLS (ASCP)cm     Appended Document: SUPRAPUBIC PAIN/[facility] appt made for 02.16.2012 @ 300pm with [facility].Loralee Pacas CMA  March 05, 2010 10:16 AM    Clinical Lists Changes  Orders: Added new Referral order of Urology Referral (Urology) - Signed

## 2010-06-18 NOTE — Discharge Summary (Signed)
Veronica Perez, POLLINO NO.:  [id number]   MEDICAL RECORD NO.:  [id number]          PATIENT TYPE:  IPS   LOCATION:  0407                          FACILITY:  [facility]   PHYSICIAN:  Anselm Jungling, MD  DATE OF BIRTH:  [birth year]   DATE OF ADMISSION:  12/17/2007  DATE OF DISCHARGE:  12/22/2007                               DISCHARGE SUMMARY   IDENTIFYING DATA AND REASON FOR ADMISSION:  This was an inpatient  psychiatric admission for Veronica Perez, a 35 year old female who was  admitted on a voluntary basis after presenting agitated and angry with  her husband.  She accused her husband of trying to get rid of me.   She came to [place] as a patient of Dr. Betti Cruz,  a psychiatrist in our  community.  This was her first ever behavioral health center admission.  She came to [place] on a regimen of Klonopin and Cymbalta.  Please refer to  the admission note for further details pertaining to the symptoms,  circumstances and history that led to her hospitalization.  She was  given an initial Axis I diagnosis of rule out psychotic disorder NOS.   MEDICAL AND LABORATORY:  The patient was in good health without any  active or chronic medical problems.  She was medically and physically  assessed by the psychiatric nurse practitioner.  There were no  significant medical issues.   HOSPITAL COURSE:  The patient was initially evaluated by Dr. Lolly Mustache.  The patient presented as a well-nourished, normally-developed adult  female who did not appear to be showing any clear signs of psychosis,  although her thought processes appeared to be mildly disorganized, and  overall her appearance was disheveled.   The patient was initially seen by the undersigned on the fourth hospital  day.  On that date I met with the patient, reviewed the chart, and  discussed her status with the nurse practitioner.  I found her in her  bed during the morning therapy group, doubled in pain in a fetal  position.  She  indicated that she was having a lot of pain coming from  her bladder.  She was too uncomfortable to be able to engage in  discussion about other matters, including the issues that brought her to  the hospital.   The patient gave [place] permission to contact her husband, and a family  session occurred with the husband on the final hospital day.  Husband  was supportive of the patient and was agreeable to her return home.  Aftercare arrangements were discussed as follows.   AFTERCARE:  The patient was to follow up with Dr. Betti Cruz with an  appointment on January 19, 2008 at 4:30 p.m., and with Binnie Kand on  December 27, 2007 at 1 p.m.   DISCHARGE MEDICATIONS:  Klonopin 0.5 mg b.i.d.   DISCHARGE DIAGNOSES:  AXIS I:  Adjustment disorder with depressed mood.  AXIS II:  Deferred.  AXIS III:  No acute or chronic illnesses.  AXIS IV:  Stressors severe.  AXIS V:  Global assessment of functioning on discharge 55.  Anselm Jungling, MD  Electronically Signed     SPB/MEDQ  D:  12/30/2007  T:  12/30/2007  Job:  [id number]

## 2010-06-21 NOTE — Group Therapy Note (Signed)
[name], Perez NO.:  [id number]   MEDICAL RECORD NO.:  [id number]          PATIENT TYPE:  WOC   LOCATION:  [facility]                   FACILITY:  [facility]   PHYSICIAN:  Elsie Lincoln, MD      DATE OF BIRTH:  1975/12/02   DATE OF SERVICE:  07/18/2004                                    CLINIC NOTE   REASON FOR VISIT:  The patient is a 35 year old female who presents to me  from the [facility] for recurrent ovarian cyst. She had  undergone exploratory laparotomy by Dr. Argentina Donovan on June 02, 2002 and  had bilateral dermoid cysts removed via exploratory laparotomy. She had an  uncomplicated postoperative course and the pathology did come back benign  dermoids. Since then the patient has had several transvaginal ultrasounds  showing mostly hemorrhagic cysts. They go from ovary to ovary so this is not  the same persistent cyst. The last ultrasound was done July 04, 2004 which  showed a 4.3 cm complex ovarian cyst. I explained to the patient that it is  now important to keep her from ovulating. We had a long discussion about  birth control pills versus Depo and she chooses Depo. She understands she  will have irregular bleeding for 3-6 months and also possibility of a 10-  pound weight gain.   PAST MEDICAL HISTORY:  Question bipolar disorder, although the patient  thinks she does not have bipolar disorder but is only an anxiety disorder.  Also has a history of recurrent UTIs when she was pregnant.   PAST SURGICAL HISTORY:  Tonsillectomy, adenoidectomy, wisdom tooth  extraction, and the exploratory laparotomy for dermoids as described above.   GYNECOLOGICAL HISTORY:  NSVD x1. No history of sexually-transmitted  diseases. No history of abnormal Pap smears. Uses condoms for birth control.   SOCIAL HISTORY:  Smokes 10 cigarettes a day, has occasional drink, and  denies drug use.   REVIEW OF SYMPTOMS:  No chest pain, no shortness of breath. No nausea,  vomiting, diarrhea, dizziness, or change in urinary habits. The patient has  bloating at the time of her period.   PHYSICAL EXAMINATION:  VITAL SIGNS:  Temperature 99.3, pulse 66, blood  pressure 126/78, weight 201.9, height 5 feet 3.5 inches.  GENERAL: Well-nourished, well-developed, no apparent distress.  ABDOMEN:  Soft, obese, nontender. Incision with a small amount of thickening  at the Pfannenstiel scar but well healed.  PELVIC:  Genitalia:  Tanner V. Vagina:  Pink, no lesions. Uterus small,  normal size, nontender. Lower quadrant diffusely tender, left greater than  right.   ASSESSMENT AND PLAN:  A 35 year old female with recurrent ovarian cysts.   1.  We will keep the patient from ovulating with Depo-PRovera. See the above      dictation for further notes on counseling.  2.  Repeat ultrasound in 6 weeks to make sure the hemorrhagic cyst has      resolved.  3.  Return to clinic in 8 weeks to see how she is doing.  4.  The patient needs to be on calcium.  KL/MEDQ  D:  07/18/2004  T:  07/18/2004  Job:  [id number]

## 2010-06-21 NOTE — Op Note (Signed)
NAME:  Veronica Perez, Veronica Perez                        ACCOUNT NO.:  [id number]   MEDICAL RECORD NO.:  [id number]                   PATIENT TYPE:  INP   LOCATION:  9311                                 [facility]   PHYSICIAN:  Phil D. Okey Dupre, M.D.                  DATE OF BIRTH:  1975/09/30   DATE OF PROCEDURE:  06/02/2002  DATE OF DISCHARGE:                                 OPERATIVE REPORT   PREOPERATIVE DIAGNOSIS:  Bilateral benign teratomas.   POSTOPERATIVE DIAGNOSIS:  Bilateral benign teratomas.   PROCEDURES:  1. Exploratory laparotomy.  2. Bilateral ovarian cystectomy.   SURGEON:  Javier Glazier. Okey Dupre, M.D.   ASSISTANT:  Debbrah Alar, M.D.   ESTIMATED BLOOD LOSS:  200 mL.   OPERATIVE FINDINGS:  On entering the peritoneal cavity, attention was  directed to the pelvis.  The uterus was of normal size, shape, and  consistency, with one tiny little fundal 1 cm subserosal leiomyoma uteri.  The right ovary measured about 6 x 5 x 5 cm and was markedly irregular with  multiple clear, translucent areas that appeared to be dermoid cysts.  The  left ovary was about 4.5 cm in diameter, it was more regular in  configuration but also gave the appearance of ovarian cysts.  The right  ovary was done first, followed by the left.  A circumferential incision was  made very shallowly around the entire circumference of the ovary, and the  outer shell was separated by sharp dissection from the intertumor, thus  leaving the cortex, and the cysts were removed with this method.  This was  done with both ovaries, which were closed then with multiple layers within  the ovarian cavity so that there were no external sutures from the base up  with continuous running 3-0 Vicryl sutures on an atraumatic needle.  Once we  reached the opening part, the bivalve portion of the ovary was closed with  in-and-out baseball stitch at the top of the ovary.  Both areas were  observed for bleeding.  Several small bleeders  were controlled with hot  cautery.  No others were noted.  There was a small bleeder in the area of  the rectus muscle on the left, which was coagulated with hot cautery.  Once  we were certain there was no further bleeding, the area was irrigated, the  fascia closed from either incision meeting in the midpoint with a continuous  running 0 Vicryl on an atraumatic needle.  Subcutaneous approximation was  carried out with 2-0 plain catgut suture, and skin edge approximation with  skin staple.  A sterile dressing was applied.  The Foley catheter was  draining clear amber urine at the end of the procedure.  The abdomen had  been entered through a Pfannenstiel incision situated 3 cm above the  symphysis pubis and extending for a total length of 14 cm and was  opened by  layers.  On entering the peritoneal cavity, the above was found.  Tape,  instrument, sponge, and needle count were reported as correct.  Both ovarian  cysts were sent for frozen section and before closure of the peritoneal  cavity, report of benign teratoma was received.                                                Phil D. Okey Dupre, M.D.   PDR/MEDQ  D:  06/02/2002  T:  06/02/2002  Job:  [id number]

## 2010-06-21 NOTE — H&P (Signed)
NAME:  Veronica Perez, Veronica Perez                        ACCOUNT NO.:  [id number]   MEDICAL RECORD NO.:  [id number]                   PATIENT TYPE:  INP   LOCATION:  NA                                   [facility]   PHYSICIAN:  Phil D. Okey Dupre, M.D.                  DATE OF BIRTH:  1975/04/21   DATE OF ADMISSION:  06/02/2002  DATE OF DISCHARGE:                                HISTORY & PHYSICAL   CHIEF COMPLAINT:  She was told she has cysts on the ovary.   HISTORY OF PRESENT ILLNESS:  This is a 35 year old white femaleemale gravida 1  para 1-0-0-1 who delivered a baby six months ago by normal vaginal delivery,  who is seen because of sharp pains in her abdomen in February.  She was sent  for a sonogram which found bilateral adnexal masses consistent with ovarian  dermoids.  The only other symptoms of significance is the patient complains  of left adnexal pain when she is in the left lateral position.  The patient  is being admitted for exploratory laparotomy and ovarian cystectomy with  possible oophorectomy.   ALLERGIES:  PENICILLIN.   MEDICATIONS:  Only prenatal vitamins.   PAST MEDICAL HISTORY:  She has never had any surgery, never had any serious  illnesses, delivered one baby.  She has had a long history of sore throats  and ear infections since childhood.   SOCIAL HISTORY:  She does not smoke or drink, or take illicit drugs.   FAMILY HISTORY:  Her father has high blood pressure, narcolepsy, and  diabetes.  She has a sister with high blood pressure and narcolepsy.  Her  mother has insomnia.   REVIEW OF SYSTEMS:  The patient has frequent ear infections and tonsillitis.  She denies any chest pains, any problem with the urinary tract or bowel.  She denies muscular problems and headaches.   PHYSICAL EXAMINATION:  VITAL SIGNS:  Temperature 98.6, pulse 68 per minute,  respirations 16 per minute, blood pressure 110/70.  GENERAL:  This is a well-developed, slightly obese white female in no  acute  distress.  HEENT:  Normal.  No nodules or masses in the neck.  The thyroid is  symmetrical and normal.  LUNGS:  Clear to auscultation and percussion.  HEART:  No murmur, normal sinus rhythm.  BREASTS:  Symmetrical with no dominant masses and no nipple discharge.  ABDOMEN:  Soft, flat, nontender.  No masses, no organomegaly.  PELVIC:  The external genitalia is normal.  The introitus is marital.  BUS  is within normal limits.  The anterior wall of the vagina shows a first  degree cystocele, well rugated.  Cervix is well palpated and the uterus is  normal size, shape, and consistency.  The ovarian cysts could not be  outlined because of the habitus of the patient, but the one on the right  gave the impression it  was just above the uterus by pressing there, which  seemed to move the uterus down.  EXTREMITIES:  Deep tendon reflexes within normal limits, no edema, and no  varicosities.  SKIN:  Normal turgor and pallor.   IMPRESSION:  Bilateral ovarian masses, probable dermoid cysts.   PLAN:  Exploratory laparotomy, ovarian cystectomy, possible oophorectomy.  The patient has been appraised of the possibility of what could be done if  these are not purely benign cysts.  We have also talked about the  possibility of complications including injury to bowel, urinary tract,  hemorrhage, infection, and anesthetic complications.  We have decided with  the patient to do a bowel prep and she is using the GoLYTELY with  neomycin/erythromycin base preparation.                                               Phil D. Okey Dupre, M.D.    PDR/MEDQ  D:  05/30/2002  T:  05/30/2002  Job:  [id number]

## 2010-06-21 NOTE — Discharge Summary (Signed)
   NAME:  Veronica Perez, Veronica Perez                        ACCOUNT NO.:  [id number]   MEDICAL RECORD NO.:  [id number]                   PATIENT TYPE:  INP   LOCATION:  9311                                 FACILITY:  [facility]   PHYSICIAN:  Phil D. Rose, M.D.                  DATE OF BIRTH:  01-Jan-1976   DATE OF ADMISSION:  06/02/2002  DATE OF DISCHARGE:  06/04/2002                                 DISCHARGE SUMMARY   HOSPITAL COURSE:  The patient is a 35 year old white female with bilateral  ovarian cysts, underwent bilateral ovarian cystectomy on the day of  admission, had a completely unremarkable postoperative course, and was  discharged in the a.m. of postoperative day #2 to return in five days for a  staple removal.  All laboratory testing was normal within her visit except  for pathology which was bilateral cystic benign teratomas.  The patient was  given discharge instructions as to follow-up, activity, and diet.   DISPOSITION:  She was discharged on Percocet and will be seen in five days  for staple removal.                                               Phil D. Okey Dupre, M.D.    PDR/MEDQ  D:  07/01/2002  T:  07/01/2002  Job:  [id number]

## 2010-06-21 NOTE — Group Therapy Note (Signed)
Veronica Perez, WINKLES NO.:  [id number]   MEDICAL RECORD NO.:  [id number]          PATIENT TYPE:  WOC   LOCATION:  [facility]                   FACILITY:  [facility]   PHYSICIAN:  Dorthula Perfect, MD     DATE OF BIRTH:  03-20-1975   DATE OF SERVICE:  11/06/2005                                    CLINIC NOTE   A 35 year old white female, para 1, returned for yearly exam. In 2004 she  had laparotomy with removal of bilateral dermoid cysts. She has tried Depo-  Provera but it caused her to gain an awful lot of weight. She has been on  Yaz to control her periods but has developed some skin side effects from the  Yaz. She stopped Yaz yesterday. Apparently she had an ultrasound at [facility] a few weeks ago but she does not know the result as far as the  ovaries.   She is continuing to try to lose weight. However she is only eating 1 meal  per day and drinks hardly anything. She is not physically active.   PAST MEDICAL AND SURGICAL HISTORY:  See her record from last year.   PHYSICAL EXAMINATION:  VITAL SIGNS:  Blood pressure 124/79. Weight 199  pounds. Heart is 5 feet 3 inches.  BREASTS:  Exam is normal.  ABDOMEN:  Soft and obese. She has a large panniculus which hangs down over a  well-healed transverse scar. This large panniculus apparently gives her a  lot of discomfort when she tries to be physically active.  PELVIC:  External genitalia and BUS glands are normal. Vaginal vault was  normal. Cervix is epithelialized. Uterus is of normal size and shape and is  nontender. The adnexal areas are negative, nontender and ovaries are not  felt.   DIAGNOSES:  1. Normal gynecological exam.  2. Exogenous obesity.   DISPOSITION:  1. Pap smear.  2. Patient has been asked to contact Family Practice and have them send [place]      a copy of the ultrasound so we can see what it says about her ovaries.  3. I have discussed in some detail what she should be doing to lose weight    including eating more meals, eating the right foods and being much more      physically active.           ______________________________  Dorthula Perfect, MD     ER/MEDQ  D:  11/06/2005  T:  11/07/2005  Job:  [id number]

## 2010-06-21 NOTE — Op Note (Signed)
NAME:  Veronica Perez, Veronica Perez                        ACCOUNT NO.:  [id number]   MEDICAL RECORD NO.:  [id number]                   PATIENT TYPE:  AMB   LOCATION:  [facility]SC                                  FACILITY:  [facility]   PHYSICIAN:  Kathy Breach, M.D.                   DATE OF BIRTH:  14-May-1975   DATE OF PROCEDURE:  05/15/2003  DATE OF DISCHARGE:                                 OPERATIVE REPORT   PREOPERATIVE DIAGNOSIS:  Chronic tonsillitis.   POSTOPERATIVE DIAGNOSIS:  Chronic tonsillitis.   OPERATION PERFORMED:  Adenotonsillectomy.   SURGEON:  Kathy Breach, M.D.   ANESTHESIA:  General orotracheal anesthesia.   DESCRIPTION OF PROCEDURE:  Under general orotracheal anesthesia, a Crowe-  Davis mouth gag inserted and the patient put in [place] position.  Inspection  of the oral cavity revealed a normal-appearing soft palate, hard palate  intact to palpation.  Tonsils 1 to 2+ enlarged, deeply cryptic in appearance  of chronic tonsillitis that were nonpulsatile on palpation.  Nasopharyngeal  mirror examination revealed moderate midline adenoid tissue to be present.  Left tonsil was grasped at the superior pole and removed by electrical  dissection maintaining complete hemostasis with electrocautery. The right  tonsil was removed in similar fashion.  The red rubber catheter was then  passed through the left nasal chamber and used to elevate the soft palate.  Under mirror visualization with suction cautery, the adenoid tissue was  completely ablated by coagulation.  Hemostasis was complete with  electrocautery.  Blood loss for the procedure was less than 10 mL.  The  patient tolerated the procedure well and was taken to the recovery room in  stable general condition.                                               Kathy Breach, M.D.    Venia Minks  D:  05/15/2003  T:  05/15/2003  Job:  [id number]

## 2010-11-05 LAB — URINALYSIS, ROUTINE W REFLEX MICROSCOPIC
Bilirubin Urine: NEGATIVE
Leukocytes, UA: NEGATIVE
Nitrite: NEGATIVE
Specific Gravity, Urine: 1.026
Urobilinogen, UA: 1
pH: 7

## 2010-11-05 LAB — DRUGS OF ABUSE SCREEN W/O ALC, ROUTINE URINE
Amphetamine Screen, Ur: NEGATIVE
Benzodiazepines.: NEGATIVE
Cocaine Metabolites: NEGATIVE
Phencyclidine (PCP): NEGATIVE
Propoxyphene: NEGATIVE

## 2010-11-05 LAB — COMPREHENSIVE METABOLIC PANEL
ALT: 15
AST: 11
Alkaline Phosphatase: 45
CO2: 26
Calcium: 8.4
Chloride: 109
GFR calc Af Amer: >60
GFR calc non Af Amer: >60
Glucose, Bld: 89
Potassium: 3.2 — ABNORMAL LOW
Sodium: 141

## 2010-11-05 LAB — CBC
Hemoglobin: 12
MCHC: 34
RBC: 4.11
WBC: 9.5

## 2010-11-05 LAB — URINE MICROSCOPIC-ADD ON

## 2010-11-08 LAB — DIFFERENTIAL
Basophils Relative: 1 % (ref 0–1)
Eosinophils Absolute: 0.2 10*3/uL (ref 0.0–0.7)
Eosinophils Relative: 1 % (ref 0–5)
Lymphs Abs: 2 10*3/uL (ref 0.7–4.0)
Monocytes Relative: 5 % (ref 3–12)

## 2010-11-08 LAB — URINE MICROSCOPIC-ADD ON

## 2010-11-08 LAB — URINALYSIS, ROUTINE W REFLEX MICROSCOPIC
Bilirubin Urine: NEGATIVE
Glucose, UA: NEGATIVE mg/dL
Hgb urine dipstick: NEGATIVE
Ketones, ur: 15 mg/dL — AB
Protein, ur: NEGATIVE mg/dL
Urobilinogen, UA: 0.2 mg/dL (ref 0.0–1.0)

## 2010-11-08 LAB — CBC
HCT: 38.5 % (ref 36.0–46.0)
MCHC: 34.3 g/dL (ref 30.0–36.0)
MCV: 85.6 fL (ref 78.0–100.0)
RBC: 4.49 MIL/uL (ref 3.87–5.11)
WBC: 11.6 10*3/uL — ABNORMAL HIGH (ref 4.0–10.5)

## 2010-11-08 LAB — PREGNANCY, URINE: Preg Test, Ur: NEGATIVE

## 2010-11-08 LAB — RAPID URINE DRUG SCREEN, HOSP PERFORMED
Barbiturates: NOT DETECTED
Opiates: NOT DETECTED
Tetrahydrocannabinol: NOT DETECTED

## 2010-11-08 LAB — BASIC METABOLIC PANEL
BUN: 6 mg/dL (ref 6–23)
CO2: 26 mEq/L (ref 19–32)
Chloride: 107 mEq/L (ref 96–112)
Creatinine, Ser: 0.74 mg/dL (ref 0.4–1.2)
Potassium: 3.5 mEq/L (ref 3.5–5.1)

## 2010-11-08 LAB — ETHANOL: Alcohol, Ethyl (B): <5 mg/dL (ref 0–10)

## 2010-12-30 ENCOUNTER — Ambulatory Visit: Payer: Self-pay | Admitting: Family Medicine

## 2011-08-13 ENCOUNTER — Emergency Department (INDEPENDENT_AMBULATORY_CARE_PROVIDER_SITE_OTHER): Payer: 59

## 2011-08-13 ENCOUNTER — Emergency Department (INDEPENDENT_AMBULATORY_CARE_PROVIDER_SITE_OTHER)
Admission: EM | Admit: 2011-08-13 | Discharge: 2011-08-13 | Disposition: A | Discharge status: Home / Self Care | Payer: 59 | Source: Home / Self Care | Attending: Family Medicine | Admitting: Family Medicine

## 2011-08-13 ENCOUNTER — Encounter (HOSPITAL_COMMUNITY): Payer: Self-pay | Admitting: Emergency Medicine

## 2011-08-13 DIAGNOSIS — M62838 Other muscle spasm: Secondary | ICD-10-CM

## 2011-08-13 DIAGNOSIS — F411 Generalized anxiety disorder: Secondary | ICD-10-CM

## 2011-08-13 DIAGNOSIS — F419 Anxiety disorder, unspecified: Secondary | ICD-10-CM

## 2011-08-13 HISTORY — DX: Major depressive disorder, single episode, unspecified: F32.9

## 2011-08-13 HISTORY — DX: Depression, unspecified: F32.A

## 2011-08-13 HISTORY — DX: Bipolar disorder, unspecified: F31.9

## 2011-08-13 HISTORY — DX: Panic disorder (episodic paroxysmal anxiety): F41.0

## 2011-08-13 MED ORDER — CYCLOBENZAPRINE HCL 10 MG PO TABS: 10.0000 mg | ORAL_TABLET | Freq: Two times a day (BID) | ORAL | Status: AC | PRN

## 2011-08-13 NOTE — ED Notes (Signed)
Reports onset August 09 2011.  Reports neck pain and knot at base of neck.  Reports pulling sensation and pressure.  Reports squeezing neck relieves pressure.  Reports dizziness, room spinning.  Reports unable to focus.  Reports similar episode 11/20/2010.  Saw a physician, but no diagnosis

## 2011-08-13 NOTE — ED Notes (Signed)
Spoke to patient's mother privately.  Reports patient has a psychiatrist -dr reddy.  Mother is poor historian.  Reports anxiety and bipolar.
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
## 2011-08-13 NOTE — ED Notes (Signed)
Patient not ready for discharge.  Patient going to xray with stasha, radiology/phlebotomy.

## 2011-08-13 NOTE — ED Notes (Signed)
Patient in bathroom
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
## 2011-08-21 ENCOUNTER — Telehealth (HOSPITAL_COMMUNITY): Payer: Self-pay | Admitting: *Deleted

## 2011-08-21 NOTE — ED Notes (Signed)
Pt. Called on VM and said she was returning a call from the [facility]. I did not call pt. I asked Clyda Hurdle and she did not call the pt. I called back and told her I did not know who called her. She said she had called wanting her xray results. I told her they would have been back while she was here. I verified DOB and read her the radiology report. She said the muscle relaxer was helping her so there was definitely some muscle tightness there from tension and stress. Vassie Moselle 08/21/2011

## 2011-09-03 ENCOUNTER — Encounter: Payer: Self-pay | Admitting: Family Medicine

## 2011-09-11 ENCOUNTER — Encounter: Payer: Self-pay | Admitting: Family Medicine

## 2012-04-15 ENCOUNTER — Encounter: Payer: 59 | Admitting: Family Medicine

## 2012-04-17 ENCOUNTER — Encounter (HOSPITAL_COMMUNITY): Payer: Self-pay | Admitting: Emergency Medicine

## 2012-04-17 ENCOUNTER — Emergency Department (HOSPITAL_COMMUNITY)
Admission: EM | Admit: 2012-04-17 | Discharge: 2012-04-18 | Disposition: A | Discharge status: Home / Self Care | Payer: PRIVATE HEALTH INSURANCE | Attending: Emergency Medicine | Admitting: Emergency Medicine

## 2012-04-17 DIAGNOSIS — F410 Panic disorder [episodic paroxysmal anxiety] without agoraphobia: Secondary | ICD-10-CM | POA: Insufficient documentation

## 2012-04-17 DIAGNOSIS — F411 Generalized anxiety disorder: Secondary | ICD-10-CM | POA: Insufficient documentation

## 2012-04-17 DIAGNOSIS — Z87891 Personal history of nicotine dependence: Secondary | ICD-10-CM | POA: Insufficient documentation

## 2012-04-17 DIAGNOSIS — R002 Palpitations: Secondary | ICD-10-CM | POA: Insufficient documentation

## 2012-04-17 DIAGNOSIS — F319 Bipolar disorder, unspecified: Secondary | ICD-10-CM | POA: Insufficient documentation

## 2012-04-17 DIAGNOSIS — F3289 Other specified depressive episodes: Secondary | ICD-10-CM | POA: Insufficient documentation

## 2012-04-17 DIAGNOSIS — F329 Major depressive disorder, single episode, unspecified: Secondary | ICD-10-CM | POA: Insufficient documentation

## 2012-04-17 DIAGNOSIS — F419 Anxiety disorder, unspecified: Secondary | ICD-10-CM

## 2012-04-17 NOTE — ED Notes (Signed)
Pt states she has a hx of anxiety but today she is feeling more "strange" than usual. Pt is c/o pressure and pain in her face and head.

## 2012-04-18 NOTE — ED Notes (Signed)
Patient is alert and oriented x3.  She was given DC instructions and follow up visit instructions.  Patient gave verbal understanding. She was DC ambulatory under her own power to home.  V/S stable.  He was not showing any signs of distress on DC 

## 2012-04-18 NOTE — ED Provider Notes (Signed)
History     CSN: [id number]  Arrival date & time 04/17/12  2335   First MD Initiated Contact with Patient 04/17/12 2359      Chief Complaint  Patient presents with  . Anxiety     The history is provided by the patient.   patient reports developing a sense of palpitations it occurs intermittently.  She also gets a sense of anxiety and pain in her head.  This evening she felt strange and that she came to the emergency department.  She denies chest pain shortness of breath at this time.  The palpitations have resolved.  She has a long-standing history of panic attacks and anxiety.  She's talked to her psychiatrist about these symptoms and no medication changes have been made.  This evening she had some pressure in her face and her head.  None of these symptoms are exertional.  Nothing changes or improves her symptoms.  Past Medical History  Diagnosis Date  . Depression   . Bipolar disorder   . Panic attacks     Past Surgical History  Procedure Laterality Date  . Ovarian cyst surgery    . Tonsillectomy    . Adenoidectomy      No family history on file.  History  Substance Use Topics  . Smoking status: Former Smoker    Quit date: 01/18/2012  . Smokeless tobacco: Never Used  . Alcohol Use: No    OB History   Grav Para Term Preterm Abortions TAB SAB Ect Mult Living                  Review of Systems  All other systems reviewed and are negative.    Allergies  Penicillins  Home Medications   Current Outpatient Rx  Name  Route  Sig  Dispense  Refill  . clonazePAM (KLONOPIN) 0.5 MG tablet   Oral   Take 0.5 mg by mouth 3 (three) times daily. scheduled         . pentosan polysulfate (ELMIRON) 100 MG capsule   Oral   Take 100 mg by mouth daily.            BP 130/78  Pulse 85  Temp(Src) 98.6 F (37 C) (Oral)  Resp 14  SpO2 100%  LMP 04/02/2012  Physical Exam  Nursing note and vitals reviewed. Constitutional: She is oriented to person, place, and  time. She appears well-developed and well-nourished. No distress.  HENT:  Head: Normocephalic and atraumatic.  Eyes: EOM are normal.  Neck: Normal range of motion.  Cardiovascular: Normal rate, regular rhythm and normal heart sounds.   Pulmonary/Chest: Effort normal and breath sounds normal.  Abdominal: Soft. She exhibits no distension. There is no tenderness.  Musculoskeletal: Normal range of motion.  Neurological: She is alert and oriented to person, place, and time.  Skin: Skin is warm and dry.  Psychiatric: She has a normal mood and affect. Judgment normal.    ED Course  Procedures (including critical care time)   Date: 04/18/2012  Rate: 86  Rhythm: normal sinus rhythm  QRS Axis: normal  Intervals: normal  ST/T Wave abnormalities: normal  Conduction Disutrbances: none  Narrative Interpretation:   Old EKG Reviewed: no prior ecg     Labs Reviewed - No data to display No results found.   1. Palpitations   2. Anxiety       MDM  There's been anxiety issue.  She is having some palpitations though.  Cardiology followup for possible outpatient  Holter.  She'll followup with her psychiatrist.        Lyanne Co, MD 04/18/12 [phone]

## 2012-04-18 NOTE — ED Notes (Signed)
Patient is alert and oriented x3.  She is complaining of palpitations with pain  In her face with head aches.  EKG is normal.  Patient states that she does have a  History of anxiety.  She denies any pain or nausea currently

## 2012-04-27 ENCOUNTER — Encounter: Payer: 59 | Admitting: Family Medicine

## 2012-06-04 ENCOUNTER — Ambulatory Visit (INDEPENDENT_AMBULATORY_CARE_PROVIDER_SITE_OTHER): Payer: 59 | Admitting: Family Medicine

## 2012-06-04 ENCOUNTER — Encounter: Payer: Self-pay | Admitting: Family Medicine

## 2012-06-04 ENCOUNTER — Ambulatory Visit: Payer: 59

## 2012-06-04 VITALS — BP 140/103 | HR 98 | Temp 97.7°F | Ht 64.5 in | Wt 192.7 lb

## 2012-06-04 DIAGNOSIS — M542 Cervicalgia: Secondary | ICD-10-CM | POA: Insufficient documentation

## 2012-06-04 MED ORDER — CYCLOBENZAPRINE HCL 10 MG PO TABS: 10.0000 mg | ORAL_TABLET | Freq: Two times a day (BID) | ORAL | Status: DC | PRN
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
## 2012-06-04 NOTE — Patient Instructions (Signed)
Please try taking the muscle relaxer I sent in one or two times per day for your neck. If you change your mind about an injection in your neck, let me know. If you change your mind about talking with our social worker about housing options, let me know. Plan on coming back to see me in 2-3 weeks.

## 2012-06-04 NOTE — Assessment & Plan Note (Signed)
MSK pain with stress component.  Review of x-ray from last year shows no significant bony abnormality.  Discussed the role of pharmacotherapy and manual therapy with patient.  Offered trigger point injections and referral to social work for help with housing/stress/safety.  Pt not interested in either and elects to try muscle relaxer.

## 2012-06-07 ENCOUNTER — Telehealth: Payer: Self-pay | Admitting: *Deleted

## 2012-06-07 NOTE — Telephone Encounter (Signed)
Prior authorization request received from [employer] and [id number] called for approval - will fax form Jermani Pund, Harold Hedge, RN

## 2012-06-11 NOTE — Telephone Encounter (Signed)
Prior authorization received for cyclobenzaprine from [facility]. -called [facility] to inform. PA good until 02/02/13 Wyatt Haste, RN-BSN

## 2012-06-25 ENCOUNTER — Ambulatory Visit (INDEPENDENT_AMBULATORY_CARE_PROVIDER_SITE_OTHER): Payer: 59 | Admitting: Family Medicine

## 2012-06-25 ENCOUNTER — Encounter: Payer: Self-pay | Admitting: Family Medicine

## 2012-06-25 VITALS — BP 122/72 | HR 79 | Temp 99.6°F | Ht 64.5 in | Wt 191.0 lb

## 2012-06-25 DIAGNOSIS — M542 Cervicalgia: Secondary | ICD-10-CM

## 2012-06-25 MED ORDER — PROPRANOLOL HCL ER 60 MG PO CP24: 60.0000 mg | ORAL_CAPSULE | Freq: Every day | ORAL | Status: DC

## 2012-06-25 NOTE — Patient Instructions (Signed)
Start taking propranolol every day (once per day) to see if it helps with your headaches. Please continue your neck exercises and your muscle relaxer as needed.
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
## 2012-07-08 NOTE — Assessment & Plan Note (Signed)
Cont muscle relaxer.  Discussed, again, with patient that this is likely stress related and that anything she can do to reduece stress would be helpful.  Discussed, again, how home situation is likely playing into this.  Pt remains resistant to any changes in home/living situation.

## 2012-07-26 ENCOUNTER — Ambulatory Visit (INDEPENDENT_AMBULATORY_CARE_PROVIDER_SITE_OTHER): Payer: 59 | Admitting: Family Medicine

## 2012-07-26 ENCOUNTER — Encounter: Payer: Self-pay | Admitting: Family Medicine

## 2012-07-26 VITALS — BP 126/80 | HR 89 | Temp 99.1°F | Ht 64.5 in | Wt 194.0 lb

## 2012-07-26 DIAGNOSIS — M542 Cervicalgia: Secondary | ICD-10-CM

## 2012-07-26 DIAGNOSIS — IMO0002 Reserved for concepts with insufficient information to code with codable children: Secondary | ICD-10-CM

## 2012-07-26 DIAGNOSIS — T7491XA Unspecified adult maltreatment, confirmed, initial encounter: Secondary | ICD-10-CM

## 2012-07-26 MED ORDER — CYCLOBENZAPRINE HCL 10 MG PO TABS: 10.0000 mg | ORAL_TABLET | Freq: Two times a day (BID) | ORAL | Status: DC | PRN
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
## 2012-07-26 NOTE — Patient Instructions (Signed)
It was good to see you today! I have sent in some refills on your flexeril. I would recommend that you get in to see your new doctor in a month or two.

## 2012-07-31 DIAGNOSIS — IMO0002 Reserved for concepts with insufficient information to code with codable children: Secondary | ICD-10-CM | POA: Insufficient documentation

## 2012-07-31 NOTE — Assessment & Plan Note (Signed)
Cont flexeril PRN, neck ROM exercises
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
## 2012-10-29 ENCOUNTER — Encounter (HOSPITAL_COMMUNITY): Payer: Self-pay | Admitting: Emergency Medicine

## 2012-10-29 ENCOUNTER — Emergency Department (INDEPENDENT_AMBULATORY_CARE_PROVIDER_SITE_OTHER)
Admission: EM | Admit: 2012-10-29 | Discharge: 2012-10-29 | Disposition: A | Discharge status: Home / Self Care | Payer: 59 | Source: Home / Self Care | Attending: Family Medicine | Admitting: Family Medicine

## 2012-10-29 ENCOUNTER — Ambulatory Visit (INDEPENDENT_AMBULATORY_CARE_PROVIDER_SITE_OTHER): Payer: 59 | Admitting: Family Medicine

## 2012-10-29 ENCOUNTER — Encounter: Payer: Self-pay | Admitting: Family Medicine

## 2012-10-29 ENCOUNTER — Encounter: Payer: Self-pay | Admitting: Clinical

## 2012-10-29 VITALS — BP 96/69 | HR 107 | Temp 98.4°F | Wt 181.0 lb

## 2012-10-29 DIAGNOSIS — M542 Cervicalgia: Secondary | ICD-10-CM

## 2012-10-29 DIAGNOSIS — T7491XA Unspecified adult maltreatment, confirmed, initial encounter: Secondary | ICD-10-CM

## 2012-10-29 DIAGNOSIS — IMO0002 Reserved for concepts with insufficient information to code with codable children: Secondary | ICD-10-CM

## 2012-10-29 MED ORDER — CYCLOBENZAPRINE HCL 10 MG PO TABS: 10.0000 mg | ORAL_TABLET | Freq: Three times a day (TID) | ORAL | Status: DC | PRN

## 2012-10-29 MED ORDER — IBUPROFEN 600 MG PO TABS: 600.0000 mg | ORAL_TABLET | Freq: Three times a day (TID) | ORAL | Status: DC | PRN
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
## 2012-10-29 NOTE — Patient Instructions (Addendum)
It was nice seeing you today.  Social work will help provide you with resources to get out of your situation.  I have refilled Flexeril and given Ibuprofen for you neck pain.   Follow up in 3 months.

## 2012-10-29 NOTE — Assessment & Plan Note (Signed)
Patient treated with OMT today.  I performed suboccipital and cervical paraspinal soft tissue OMT today. Patient tolerated well and had significant reduction in her pain. Flexeril and Ibuprofen given for pain.
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
## 2012-10-29 NOTE — ED Provider Notes (Signed)
Medical screening examination/treatment/procedure(s) were performed by resident physician or non-physician practitioner and as supervising physician I was immediately available for consultation/collaboration.   Barkley Bruns MD.   Linna Hoff, MD 10/29/12 [id number]
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
## 2012-10-29 NOTE — ED Provider Notes (Signed)
CSN: [id number]     Arrival date & time 10/29/12  1241 History   First MD Initiated Contact with Patient 10/29/12 1255     Chief Complaint  Patient presents with  . Neck Pain   (Consider location/radiation/quality/duration/timing/severity/associated sxs/prior Treatment) HPI Comments: 37 year old female in acute emotional distress presents complaining of exacerbation of her chronic back and neck pain. She has pain that shoots from her mid back up to her neck. The area is very tight, feels swollen, it is tender to touch. She also feels a pressure in her forehead. This pain began in October of 2012. It is only slightly worse than usual today. She also is requesting that we make sure she is not having many seizures or mini strokes. When asked why she thinks this could be having, she replies "I don't know you tell me, you're the doctor" and cannot answer the question any better than that. She denies any recent injury. She does have increased life stresses.  Patient is a 37 y.o. female presenting with neck pain.  Neck Pain Associated symptoms: headaches   Associated symptoms: no chest pain, no fever and no weakness     Past Medical History  Diagnosis Date  . Depression   . Bipolar disorder   . Panic attacks    Past Surgical History  Procedure Laterality Date  . Ovarian cyst surgery    . Tonsillectomy    . Adenoidectomy     No family history on file. History  Substance Use Topics  . Smoking status: Former Smoker    Quit date: 01/18/2012  . Smokeless tobacco: Never Used  . Alcohol Use: No   OB History   Grav Para Term Preterm Abortions TAB SAB Ect Mult Living                 Review of Systems  Constitutional: Negative for fever and chills.  HENT: Positive for neck pain.   Eyes: Negative for visual disturbance.  Respiratory: Negative for cough and shortness of breath.   Cardiovascular: Negative for chest pain, palpitations and leg swelling.  Gastrointestinal: Negative for  nausea, vomiting and abdominal pain.  Endocrine: Negative for polydipsia and polyuria.  Genitourinary: Negative for dysuria, urgency and frequency.  Musculoskeletal: Negative for myalgias and arthralgias.  Skin: Negative for rash.  Neurological: Positive for tremors and headaches. Negative for dizziness, weakness and light-headedness.    Allergies  Penicillins  Home Medications   Current Outpatient Rx  Name  Route  Sig  Dispense  Refill  . clonazePAM (KLONOPIN) 0.5 MG tablet   Oral   Take 0.5 mg by mouth 4 (four) times daily.         . cyclobenzaprine (FLEXERIL) 10 MG tablet   Oral   Take 1 tablet (10 mg total) by mouth 2 (two) times daily as needed for muscle spasms.   60 tablet   1    BP 135/69  Pulse 90  Temp(Src) 98.5 F (36.9 C) (Oral)  Resp 18  SpO2 99%  LMP 10/29/2012 Physical Exam  Nursing note and vitals reviewed. Constitutional: She is oriented to person, place, and time. Vital signs are normal. She appears well-developed and well-nourished. She appears distressed.  HENT:  Head: Atraumatic.  Eyes: EOM are normal. Pupils are equal, round, and reactive to light.  Cardiovascular: Normal rate, regular rhythm and normal heart sounds.  Exam reveals no gallop and no friction rub.   No murmur heard. Pulmonary/Chest: Effort normal and breath sounds normal. No respiratory distress.  She has no wheezes. She has no rales.  Abdominal: Soft. There is no tenderness.  Musculoskeletal:       Cervical back: She exhibits tenderness and bony tenderness. She exhibits normal range of motion, no swelling, no edema, no deformity and no spasm.  Neurological: She is alert and oriented to person, place, and time. She has normal strength.  Skin: Skin is warm and dry. She is not diaphoretic.  Psychiatric: Judgment normal. Her mood appears anxious. She is agitated. She exhibits a depressed mood.    ED Course  Procedures (including critical care time) Labs Review Labs Reviewed - No  data to display Imaging Review No results found.  MDM   1. Neck pain, musculoskeletal    With family practice office on the phone, they can see her at 2:30 today. Patient is agreeable with this plan    Graylon Good, PA-C 10/29/12 1356

## 2012-12-18 ENCOUNTER — Encounter (HOSPITAL_COMMUNITY): Payer: Self-pay | Admitting: Emergency Medicine

## 2012-12-18 ENCOUNTER — Emergency Department (HOSPITAL_COMMUNITY)
Admission: EM | Admit: 2012-12-18 | Discharge: 2012-12-21 | Disposition: A | Discharge status: Home / Self Care | Payer: PRIVATE HEALTH INSURANCE | Attending: Emergency Medicine | Admitting: Emergency Medicine

## 2012-12-18 ENCOUNTER — Emergency Department (HOSPITAL_COMMUNITY): Payer: PRIVATE HEALTH INSURANCE

## 2012-12-18 DIAGNOSIS — F410 Panic disorder [episodic paroxysmal anxiety] without agoraphobia: Secondary | ICD-10-CM | POA: Diagnosis present

## 2012-12-18 DIAGNOSIS — F319 Bipolar disorder, unspecified: Secondary | ICD-10-CM | POA: Diagnosis present

## 2012-12-18 DIAGNOSIS — F329 Major depressive disorder, single episode, unspecified: Secondary | ICD-10-CM

## 2012-12-18 DIAGNOSIS — J309 Allergic rhinitis, unspecified: Secondary | ICD-10-CM

## 2012-12-18 DIAGNOSIS — F3289 Other specified depressive episodes: Secondary | ICD-10-CM | POA: Insufficient documentation

## 2012-12-18 DIAGNOSIS — F3110 Bipolar disorder, current episode manic without psychotic features, unspecified: Secondary | ICD-10-CM | POA: Diagnosis present

## 2012-12-18 DIAGNOSIS — F309 Manic episode, unspecified: Secondary | ICD-10-CM

## 2012-12-18 DIAGNOSIS — N390 Urinary tract infection, site not specified: Secondary | ICD-10-CM

## 2012-12-18 DIAGNOSIS — F32A Depression, unspecified: Secondary | ICD-10-CM

## 2012-12-18 DIAGNOSIS — K219 Gastro-esophageal reflux disease without esophagitis: Secondary | ICD-10-CM

## 2012-12-18 LAB — BASIC METABOLIC PANEL
BUN: 9 mg/dL (ref 6–23)
CO2: 21 mEq/L (ref 19–32)
Chloride: 102 mEq/L (ref 96–112)
Creatinine, Ser: 0.75 mg/dL (ref 0.50–1.10)
Glucose, Bld: 112 mg/dL — ABNORMAL HIGH (ref 70–99)
Potassium: 4 mEq/L (ref 3.5–5.1)

## 2012-12-18 LAB — CBC
HCT: 37.7 % (ref 36.0–46.0)
MCV: 82.3 fL (ref 78.0–100.0)
RBC: 4.58 MIL/uL (ref 3.87–5.11)
RDW: 12.3 % (ref 11.5–15.5)
WBC: 13.3 10*3/uL — ABNORMAL HIGH (ref 4.0–10.5)

## 2012-12-18 LAB — RAPID URINE DRUG SCREEN, HOSP PERFORMED
Barbiturates: NOT DETECTED
Tetrahydrocannabinol: NOT DETECTED

## 2012-12-18 LAB — HEPATIC FUNCTION PANEL
ALT: 17 U/L (ref 0–35)
Alkaline Phosphatase: 51 U/L (ref 39–117)
Bilirubin, Direct: <0.2 mg/dL (ref 0.0–0.3)
Total Bilirubin: 0.5 mg/dL (ref 0.3–1.2)

## 2012-12-18 LAB — URINALYSIS, ROUTINE W REFLEX MICROSCOPIC
Hgb urine dipstick: NEGATIVE
Protein, ur: NEGATIVE mg/dL
Urobilinogen, UA: 0.2 mg/dL (ref 0.0–1.0)

## 2012-12-18 LAB — SALICYLATE LEVEL: Salicylate Lvl: 2 mg/dL — ABNORMAL LOW (ref 2.8–20.0)

## 2012-12-18 LAB — URINE MICROSCOPIC-ADD ON

## 2012-12-18 MED ORDER — LORAZEPAM 2 MG/ML IJ SOLN
1.0000 mg | Freq: Once | INTRAMUSCULAR | Status: AC
  Administered 2012-12-18: 1 mg via INTRAMUSCULAR
  Filled 2012-12-18: qty 1

## 2012-12-18 MED ORDER — NICOTINE 21 MG/24HR TD PT24
21.0000 mg | MEDICATED_PATCH | Freq: Once | TRANSDERMAL | Status: AC
  Administered 2012-12-19: 21 mg via TRANSDERMAL
  Filled 2012-12-18: qty 1

## 2012-12-18 MED ORDER — STERILE WATER FOR INJECTION IJ SOLN
INTRAMUSCULAR | Status: AC
  Administered 2012-12-18: 1 mL
  Filled 2012-12-18: qty 10

## 2012-12-18 MED ORDER — ZIPRASIDONE MESYLATE 20 MG IM SOLR
10.0000 mg | Freq: Once | INTRAMUSCULAR | Status: AC
  Administered 2012-12-18: 10 mg via INTRAMUSCULAR
  Filled 2012-12-18: qty 20

## 2012-12-18 NOTE — ED Provider Notes (Signed)
CSN: [id number]     Arrival date & time 12/18/12  1903 History   First MD Initiated Contact with Patient 12/18/12 1908     No chief complaint on file.  (Consider location/radiation/quality/duration/timing/severity/associated sxs/prior Treatment) Patient is a 37 y.o. female presenting with mental health disorder. The history is provided by the patient and a parent. The history is limited by the absence of a caregiver and the condition of the patient.  Mental Health Problem Presenting symptoms: agitation, disorganized speech and disorganized thought process   Patient accompanied by:  Family member Degree of incapacity (severity):  Severe Onset quality:  Sudden Duration:  1 week Timing:  Constant Progression:  Worsening Chronicity:  Recurrent Context: stressful life event     Past Medical History  Diagnosis Date  . Depression   . Bipolar disorder   . Panic attacks    Past Surgical History  Procedure Laterality Date  . Ovarian cyst surgery    . Tonsillectomy    . Adenoidectomy     No family history on file. History  Substance Use Topics  . Smoking status: Former Smoker    Quit date: 01/18/2012  . Smokeless tobacco: Never Used  . Alcohol Use: No   OB History   Grav Para Term Preterm Abortions TAB SAB Ect Mult Living                 Review of Systems  Unable to perform ROS: Psychiatric disorder  Psychiatric/Behavioral: Positive for agitation.    Allergies  Penicillins  Home Medications   Current Outpatient Rx  Name  Route  Sig  Dispense  Refill  . clonazePAM (KLONOPIN) 0.5 MG tablet   Oral   Take 0.5 mg by mouth 4 (four) times daily.         . cyclobenzaprine (FLEXERIL) 10 MG tablet   Oral   Take 1 tablet (10 mg total) by mouth 3 (three) times daily as needed for muscle spasms.   60 tablet   1   . ibuprofen (ADVIL,MOTRIN) 600 MG tablet   Oral   Take 1 tablet (600 mg total) by mouth every 8 (eight) hours as needed for pain.   30 tablet   0    Pulse  143  Temp(Src) 100.8 F (38.2 C) (Oral)  Resp 22  SpO2 96% Physical Exam  Nursing note and vitals reviewed. Constitutional: She is oriented to person, place, and time. She appears well-developed and well-nourished. She appears distressed.  HENT:  Head: Normocephalic and atraumatic.  Eyes: EOM are normal. Pupils are equal, round, and reactive to light.  Neck: Normal range of motion. Neck supple.  Cardiovascular: Normal rate and regular rhythm.  Exam reveals no friction rub.   No murmur heard. Pulmonary/Chest: Effort normal and breath sounds normal. No respiratory distress. She has no wheezes. She has no rales.  Abdominal: Soft. She exhibits no distension. There is no tenderness. There is no rebound.  Musculoskeletal: Normal range of motion. She exhibits no edema.  Neurological: She is oriented to person, place, and time.  Skin: She is not diaphoretic.  Psychiatric: Her affect is inappropriate. Her speech is rapid and/or pressured and tangential. She is agitated and hyperactive. She is not actively hallucinating and not combative. She is inattentive.    ED Course  Procedures (including critical care time) Labs Review Labs Reviewed  CBC - Abnormal; Notable for the following:    WBC 13.3 (*)    MCHC 36.3 (*)    All other components within  normal limits  BASIC METABOLIC PANEL - Abnormal; Notable for the following:    Glucose, Bld 112 (*)    All other components within normal limits  HEPATIC FUNCTION PANEL - Abnormal; Notable for the following:    AST 42 (*)    All other components within normal limits  SALICYLATE LEVEL - Abnormal; Notable for the following:    Salicylate Lvl 2.0 (*)    All other components within normal limits  URINALYSIS, ROUTINE W REFLEX MICROSCOPIC - Abnormal; Notable for the following:    APPearance CLOUDY (*)    Leukocytes, UA SMALL (*)    All other components within normal limits  URINE MICROSCOPIC-ADD ON - Abnormal; Notable for the following:    Squamous  Epithelial / LPF MANY (*)    Bacteria, UA MANY (*)    All other components within normal limits  URINE CULTURE  ACETAMINOPHEN LEVEL  ETHANOL  URINE RAPID DRUG SCREEN (HOSP PERFORMED)   he denies it was a Imaging Review No results found.  EKG Interpretation   None      Level 5 Caveat due to Psychiatric Disorder  MDM   1. Mania   2. Manic episode   3. UTI (lower urinary tract infection)    35F with hx of depression, bipolar disorder presenting with mania. Patient has history of this per the mother. Patient mother reports a lot of stress lately as her she is and her daughter had just one of my structures are making her overwhelmed. The patient is here with tangential thinking. Should not make eye contact at all. She keeps saying "wrote to all the doctors and lawyers". She also keep shaking her hands and to say "my nerves." At one point patient about her hair and began to eat it. Patient given Geodon and Ativan to calm her down. Psych consulted. Labs show UTI. Cipro ordered.  Dagmar Hait, MD 12/19/12 [phone]

## 2012-12-18 NOTE — BH Assessment (Signed)
Received call for tele-assessment. Spoke with Dr. Dagmar Hait who said Pt has been very manic, tangential, nervous and yelling. She was given Geodon. Tele-assessment will be initiated.  Harlin Rain Ria Comment, [facility] Triage Specialist

## 2012-12-18 NOTE — ED Notes (Signed)
Dr. Gwendolyn Grant Involuntary Commits patient

## 2012-12-18 NOTE — ED Notes (Signed)
Ford from [facility] called for tele. Psyche; pt. Not ready r/t manic behavior. Will call back.

## 2012-12-18 NOTE — ED Notes (Signed)
Family aware of plan of care; family went home this evening; pt. Verbally understands.

## 2012-12-18 NOTE — BH Assessment (Signed)
Spoke to [employer] RN to initiate tele-assessment and he said Pt is not ready for assessment. He will speak with Dr. Gwendolyn Grant and called TTS at [phone] when Pt is ready.  Harlin Rain Ria Comment, [facility] Triage Specialist
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
## 2012-12-19 ENCOUNTER — Encounter (HOSPITAL_COMMUNITY): Payer: Self-pay | Admitting: Psychiatry

## 2012-12-19 DIAGNOSIS — F3130 Bipolar disorder, current episode depressed, mild or moderate severity, unspecified: Secondary | ICD-10-CM

## 2012-12-19 DIAGNOSIS — F411 Generalized anxiety disorder: Secondary | ICD-10-CM

## 2012-12-19 DIAGNOSIS — R45851 Suicidal ideations: Secondary | ICD-10-CM

## 2012-12-19 DIAGNOSIS — F3110 Bipolar disorder, current episode manic without psychotic features, unspecified: Secondary | ICD-10-CM | POA: Diagnosis not present

## 2012-12-19 MED ORDER — LORAZEPAM 1 MG PO TABS
1.0000 mg | ORAL_TABLET | Freq: Four times a day (QID) | ORAL | Status: DC | PRN
  Administered 2012-12-19 – 2012-12-20 (×5): 1 mg via ORAL
  Filled 2012-12-19 (×6): qty 1

## 2012-12-19 MED ORDER — ACETAMINOPHEN 325 MG PO TABS
650.0000 mg | ORAL_TABLET | ORAL | Status: DC | PRN
  Administered 2012-12-19 – 2012-12-20 (×2): 650 mg via ORAL
  Filled 2012-12-19 (×2): qty 2

## 2012-12-19 MED ORDER — IBUPROFEN 200 MG PO TABS
600.0000 mg | ORAL_TABLET | Freq: Three times a day (TID) | ORAL | Status: DC | PRN
  Administered 2012-12-20: 600 mg via ORAL
  Filled 2012-12-19 (×2): qty 1

## 2012-12-19 MED ORDER — TRAMADOL HCL 50 MG PO TABS
50.0000 mg | ORAL_TABLET | Freq: Once | ORAL | Status: AC
  Administered 2012-12-19: 50 mg via ORAL
  Filled 2012-12-19: qty 1

## 2012-12-19 MED ORDER — CIPROFLOXACIN HCL 500 MG PO TABS
500.0000 mg | ORAL_TABLET | Freq: Two times a day (BID) | ORAL | Status: DC
  Administered 2012-12-19 – 2012-12-21 (×6): 500 mg via ORAL
  Filled 2012-12-19 (×6): qty 1

## 2012-12-19 NOTE — ED Notes (Addendum)
Pt less agreeable to TTS. Less vocal/verbal. Nods head in agreement to give TTS a try. TTS initiated and in progress. Soft spoken, poor eye contact. Answering questions. Sitter at [employer].

## 2012-12-19 NOTE — Progress Notes (Signed)
Tele-psych scheduled with Nanine Means, NP @1615  per TTS.  Tomi Bamberger, MHT
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
## 2012-12-19 NOTE — ED Notes (Signed)
Patient acting out very anxious and paranoid feels people are out to hurt her.She spoke with her mother via telephone and ask her mother how would it feel to loose another child. Call was ended  PRN Ativan given security notified patient does not want to go back in the room

## 2012-12-19 NOTE — ED Notes (Signed)
dispo to admit noted. Placed at 0048 by previous EDP. Current EDP notified.

## 2012-12-19 NOTE — ED Notes (Signed)
Pt. At desk requesting to use the phone to call her Mother and Father so she can tell let them know which clothes they need to bring tomorrow when they come to get her. Explained to her that phone calls were only allowed during the hours of 0900-2100. I did state that if she was to be discharged home prior to then I would let her call so they could bring her some clothes but otherwise she would have to wait til 0900 to talk to them. I showed her the rules written on the laminated sheet also. She looked tearful but went back to her room.

## 2012-12-19 NOTE — Progress Notes (Addendum)
Received phone call from [facility] at [facility], referral has been reviewed and the MD (Dr. Tiburcio Pea) wants to know what meds pt is being given currently for psychosis before being considered for admission.  Will follow up. 1658- followed up with Cathey from [facility] and faxed psych tele-assessment completed by NP at [facility]. 1850- per Lynden Ang at [facility], awaiting an updated UA/UDS and fax results 11/17 am  Pt denied at [facility] d/t pt and unit acuity per Cassie.  Tomi Bamberger, MHT

## 2012-12-19 NOTE — ED Notes (Signed)
Pt awake watching TV, alert, NAD, calm, interactive, sitter at [facility].

## 2012-12-19 NOTE — Progress Notes (Addendum)
The following facilities have been contacted regarding placement:  [facility]: spoke with Larita Fife, beds available and referral has been faxed [facility]: per Trey Paula, they have beds available and referral has been faxed [facility]: beds available per [facility] and referral has been faxed [facility]: per Olegario Messier they female beds available, referral faxed [facility]: per Belenda Cruise they have 1 female bed available, referral faxed [facility]: spoke with Mckinley Jewel, stated fax referral and MD's would review after rounds today and would call back with update [facility]: per Elita Quick, pt is too acute and they have no acute beds available [facility]: per Judd Lien, asked that referral be faxed and would review  Tomi Bamberger, MHT

## 2012-12-19 NOTE — ED Notes (Signed)
telepsych complete, pt rolled back over and went to sleep, no changes, sitter at [facility].

## 2012-12-19 NOTE — ED Notes (Signed)
Pt verbalized agreement to telepsych interview, sitter at [facility], monitor set up at [facility], (happy meal and snack finished at [facility]), pt rocking back and forth while sitting cross-legged in bed, less vocal.

## 2012-12-19 NOTE — ED Notes (Signed)
Pt. Woke up from sleep crying loudly. Stating she missed her sister. Wished she could see her again. Asked me if I had opened her door. Sitter stated door opened by itself. When questioned further Pt. States she was having a dream about her sister who was deceased. She felt like her sister had come to the hospital to see her there and was upset when she saw the door open by itself.

## 2012-12-19 NOTE — ED Notes (Signed)
[facility] notified of pt agreeable to telepsych.
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
## 2012-12-19 NOTE — ED Notes (Signed)
Sitter at [employer]. Pt observed rocking back and forth, eyes closed, hands over ears, semi-crying. Behavior interupted with the question of: "is there anything i can do for you". Pt alert, interactive, calmer, NAD. Needs did not develop. Pt agreeable to telepsych at this time. Will notify [facility].

## 2012-12-20 DIAGNOSIS — F3110 Bipolar disorder, current episode manic without psychotic features, unspecified: Secondary | ICD-10-CM | POA: Diagnosis not present

## 2012-12-20 LAB — URINE CULTURE

## 2012-12-20 MED ORDER — ZIPRASIDONE MESYLATE 20 MG IM SOLR
10.0000 mg | Freq: Once | INTRAMUSCULAR | Status: AC
  Administered 2012-12-20: 10 mg via INTRAMUSCULAR
  Filled 2012-12-20: qty 20

## 2012-12-20 MED ORDER — STERILE WATER FOR INJECTION IJ SOLN
INTRAMUSCULAR | Status: AC
  Administered 2012-12-20: 10 mL
  Filled 2012-12-20: qty 10

## 2012-12-20 NOTE — Progress Notes (Signed)
Veronica Perez, MHT completed follow up with previous referrals Patient has been declined at [facility] [place] Reg Writer resubmitted referral to [facility] [employer] intake reports not receiving. Additional referrals have been sent to [facility] for review. Due to patients level of acuity while at [facility] request has been to Juanna Cao, RN at [facility] to transfer patient to [facility] ED locked unit. Attending nurse to coordinate transfer.
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
## 2012-12-20 NOTE — ED Notes (Signed)
Pt refusing to have vital signs taken.

## 2012-12-20 NOTE — ED Notes (Signed)
When i did 715 round patient wa sin her room sitting on the bed rocking back and fourth crying hysterically saying they took my blood

## 2012-12-20 NOTE — Progress Notes (Signed)
Underwriter spoke with [facility] about medications taken in the last 24 hrs and labs.  [facility] to callback around 800 to let hospital know if going to accept her into [facility].  Blain Pais, MHT/NS

## 2012-12-20 NOTE — ED Notes (Signed)
Spoke to Pt. About her sister and grief being normal. Talked about how she felt like she had to be strong for everyone and how the family has changed she her sister's death. Discussed the need for her to heal herself first before she can more forward.
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
## 2012-12-20 NOTE — ED Notes (Addendum)
Pt. Awake. She went to bathroom then back to her room. She then came back to desk. This RN asked her if there was anything she needed. She looked tearful. Sitter stated that  she had woke with a startle and asked her if she had a bad dream again. I asked the same question to which she shook her head yes. She was reluctant to return to room. Asked her if changing rooms would help she shrugged her shoulders. Pt. Moved to rm. 21 assisted to bed. Warm blankets applied.

## 2012-12-20 NOTE — ED Provider Notes (Signed)
Pt with increasing agitation.  Staff discussed pt with [facility] psych ED and pt is suitable to be transferred to [facility].  I discussed with Dr. Rubin Payor who is accepting pt to the [facility] psych ED.  Pt is deemed by Dr. Pam Drown to require intpt treatment, but is still waiting for a bed at [facility].    Gavin Pound. Yanixan Mellinger, MD 12/20/12 1416

## 2012-12-20 NOTE — ED Notes (Addendum)
Pt. Assisted back to her room and placed on bed. Pt. Given Geodon IM at this time.

## 2012-12-20 NOTE — ED Notes (Addendum)
Pt. Did not like new room. She went back to room 25. Pt. Still not talking only doing hand motions.

## 2012-12-20 NOTE — Progress Notes (Signed)
Veronica Perez, MHT patient continues to be non compliant with directives, refusing medication, non responsive with nurses request, she does not want a sitter in her room, sitter is outside of door. Patient appears to be psychotic, nurses have notified [employer] and security to heighten watch

## 2012-12-20 NOTE — ED Notes (Signed)
Marcelino Duster, Case Manager transferred a call from [facility] to this RN. Updated them on Pt's plan of care.

## 2012-12-20 NOTE — Progress Notes (Signed)
Underwriter confirmed with Claudine, at [facility] faxed received for UA and UDS for Md to review for placement in the AM.  Blain Pais. MHT/NS

## 2012-12-20 NOTE — ED Notes (Signed)
Pt. Finally admits what upset her earlier tonight was waking up and seeing another Sitter sitting in her room so close to her that she did not know. She states that it really scared her and made her heart pound. Apologized to her again about changing the sitters but explained to her that the one was sitting while the other was eating. Acknowledged her feelings of fear as being a valid emotion for the situation and promised to do better. Explained to her that while we understood why she might be upset others might see her defensive stance towards the sitter who scared her in the wrong context and she might incur negative consequences towards her. She finally admitted she understood. Sitter at the desk switched with another one so she could not see her. Pt lying in bed.

## 2012-12-20 NOTE — ED Notes (Signed)
Nurse from [facility] called to say that they could not accept patient after report was given due to the amt of pt's they already have in psych ED. Bruce spoke with Minerva Areola at behavior health and he stated it was ok for pt to be sent to [facility]. [employer] here to pick pt up.

## 2012-12-20 NOTE — ED Notes (Signed)
Patient vistor is on her way back

## 2012-12-20 NOTE — Progress Notes (Signed)
B.Nishawn Rotan, MHT received report from Stacy at [facility] that patient has been declined due to medical acuity.
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
## 2012-12-20 NOTE — ED Notes (Signed)
Per sitter, pt yelling at her to get out of room. When this RN and Tiffany, RN asked pt what is wrong, pt refusing to answer. Pt offered ativan but refuses to come out from under the sheet. Sitter remains at pts bedside.

## 2012-12-20 NOTE — ED Notes (Signed)
Sitter able to get Pt. To return to bed. Pt. Is now resting quietly.

## 2012-12-20 NOTE — ED Notes (Signed)
Spoke with Pts mother who supplied a current med list, but she states she doesn't think she has taken any psych meds in awhile. She states the pt stopped Klonopin because it was making her manic. I called [facility] pharmacy and they report she has a prescription for Latuda 40mg  QHS but she hasnt picked it up since late July. Her mother doesn't think she ever took it.

## 2012-12-20 NOTE — ED Notes (Signed)
Pt initially refused vital signs. Writer discussed expectations on the unit and the need for data to ensure she gets the care she needs. Pt agreed to have her Vital signs taken without issue.

## 2012-12-20 NOTE — ED Notes (Signed)
Pt yelling at sitter and staff to close her door. When explained that her door could not be closed and the sitter needed to at bedside pt refused to speak to staff. [employer] at bedside speaking with pt about inappropriate behavior.

## 2012-12-21 ENCOUNTER — Encounter (HOSPITAL_COMMUNITY): Payer: Self-pay | Admitting: Registered Nurse

## 2012-12-21 ENCOUNTER — Inpatient Hospital Stay (HOSPITAL_COMMUNITY)
Admission: AD | Admit: 2012-12-21 | Discharge: 2012-12-30 | DRG: 885 | Disposition: A | Discharge status: Home / Self Care | Payer: 59 | Source: Intra-hospital | Attending: Psychiatry | Admitting: Psychiatry

## 2012-12-21 ENCOUNTER — Encounter (HOSPITAL_COMMUNITY): Payer: Self-pay | Admitting: Behavioral Health

## 2012-12-21 DIAGNOSIS — F329 Major depressive disorder, single episode, unspecified: Secondary | ICD-10-CM

## 2012-12-21 DIAGNOSIS — F319 Bipolar disorder, unspecified: Secondary | ICD-10-CM | POA: Diagnosis present

## 2012-12-21 DIAGNOSIS — Z79899 Other long term (current) drug therapy: Secondary | ICD-10-CM

## 2012-12-21 DIAGNOSIS — F410 Panic disorder [episodic paroxysmal anxiety] without agoraphobia: Secondary | ICD-10-CM | POA: Diagnosis present

## 2012-12-21 DIAGNOSIS — F3110 Bipolar disorder, current episode manic without psychotic features, unspecified: Secondary | ICD-10-CM | POA: Diagnosis not present

## 2012-12-21 DIAGNOSIS — F3289 Other specified depressive episodes: Secondary | ICD-10-CM

## 2012-12-21 DIAGNOSIS — F312 Bipolar disorder, current episode manic severe with psychotic features: Secondary | ICD-10-CM | POA: Diagnosis present

## 2012-12-21 MED ORDER — CIPROFLOXACIN HCL 500 MG PO TABS
500.0000 mg | ORAL_TABLET | Freq: Two times a day (BID) | ORAL | Status: AC
  Administered 2012-12-22 – 2012-12-28 (×13): 500 mg via ORAL
  Filled 2012-12-21 (×15): qty 1

## 2012-12-21 MED ORDER — LORAZEPAM 2 MG/ML IJ SOLN
2.0000 mg | Freq: Four times a day (QID) | INTRAMUSCULAR | Status: DC | PRN
  Administered 2012-12-21: 2 mg via INTRAMUSCULAR
  Filled 2012-12-21: qty 1

## 2012-12-21 MED ORDER — ZIPRASIDONE MESYLATE 20 MG IM SOLR
20.0000 mg | Freq: Once | INTRAMUSCULAR | Status: AC
  Administered 2012-12-21: 20 mg via INTRAMUSCULAR
  Filled 2012-12-21: qty 20

## 2012-12-21 MED ORDER — DIPHENHYDRAMINE HCL 50 MG PO CAPS
50.0000 mg | ORAL_CAPSULE | Freq: Once | ORAL | Status: AC
  Administered 2012-12-21: 50 mg via ORAL
  Filled 2012-12-21: qty 1
  Filled 2012-12-21: qty 2

## 2012-12-21 MED ORDER — LORAZEPAM 2 MG/ML IJ SOLN: 2.0000 mg | Freq: Once | INTRAMUSCULAR | Status: AC

## 2012-12-21 MED ORDER — LORAZEPAM 1 MG PO TABS
2.0000 mg | ORAL_TABLET | Freq: Once | ORAL | Status: AC
  Administered 2012-12-21: 2 mg via ORAL
  Filled 2012-12-21: qty 2

## 2012-12-21 MED ORDER — OLANZAPINE 10 MG PO TBDP
10.0000 mg | ORAL_TABLET | Freq: Three times a day (TID) | ORAL | Status: DC | PRN
  Administered 2012-12-21 – 2012-12-23 (×3): 10 mg via ORAL
  Filled 2012-12-21 (×3): qty 1

## 2012-12-21 MED ORDER — OLANZAPINE 10 MG PO TABS
ORAL_TABLET | ORAL | Status: AC
  Filled 2012-12-21: qty 1

## 2012-12-21 MED ORDER — DIPHENHYDRAMINE HCL 50 MG/ML IJ SOLN
50.0000 mg | Freq: Once | INTRAMUSCULAR | Status: AC
  Administered 2012-12-21: 50 mg via INTRAMUSCULAR
  Filled 2012-12-21: qty 1

## 2012-12-21 MED ORDER — DIPHENHYDRAMINE HCL 50 MG/ML IJ SOLN
50.0000 mg | Freq: Once | INTRAMUSCULAR | Status: AC
  Filled 2012-12-21: qty 1

## 2012-12-21 MED ORDER — ALUM & MAG HYDROXIDE-SIMETH 200-200-20 MG/5ML PO SUSP: 30.0000 mL | ORAL | Status: DC | PRN

## 2012-12-21 MED ORDER — MAGNESIUM HYDROXIDE 400 MG/5ML PO SUSP: 30.0000 mL | Freq: Every day | ORAL | Status: DC | PRN

## 2012-12-21 MED ORDER — ACETAMINOPHEN 325 MG PO TABS
650.0000 mg | ORAL_TABLET | Freq: Four times a day (QID) | ORAL | Status: DC | PRN
  Administered 2012-12-21 – 2012-12-27 (×2): 650 mg via ORAL
  Filled 2012-12-21 (×3): qty 2
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
## 2012-12-21 NOTE — Progress Notes (Addendum)
Writer consulted with the Psychiatrist (Dr. Ladona Ridgel) and the NP Lysbeth Penner) regarding the patient meeting criteria for inpatient hospitalization.   Writer informed the ER MD and the nurse working with the patient that she been accepted to [facility] Bed 402-2.  Writer faxed the support paperwork to [facility].  The nurse will arrange transportation.

## 2012-12-21 NOTE — ED Notes (Signed)
Patient had a shower with Marcelino Duster, MHT assistance. Encouragement and support provided and safety maintain.

## 2012-12-21 NOTE — ED Notes (Signed)
Patient sitting in bed rocking, writer when in and ask patient did she need anything for anxiety patient stated yes. Writer went to call Donell Sievert, PA for orders as Clinical research associate was calling PA, patient was seen climbing up counter behind bed. Patient assisted down by MHT's and safety maintain. MHT at patient bedside while writer continue to get medication orders.

## 2012-12-21 NOTE — Tx Team (Signed)
Initial Interdisciplinary Treatment Plan  PATIENT STRENGTHS: (choose at least two) Ability for insight Motivation for treatment/growth  PATIENT STRESSORS: Marital or family conflict Medication change or noncompliance   PROBLEM LIST: Problem List/Patient Goals Date to be addressed Date deferred Reason deferred Estimated date of resolution  Manic  12/21/12     Delusions 12/21/12                                                DISCHARGE CRITERIA:  Ability to meet basic life and health needs Adequate post-discharge living arrangements Improved stabilization in mood, thinking, and/or behavior Verbal commitment to aftercare and medication compliance  PRELIMINARY DISCHARGE PLAN: Attend aftercare/continuing care group Attend PHP/IOP  PATIENT/FAMIILY INVOLVEMENT: This treatment plan has been presented to and reviewed with the patient, Veronica Perez, and/or family member.  The patient and family have been given the opportunity to ask questions and make suggestions.  Skyleen Bentley L 12/21/2012, 3:15 PM

## 2012-12-21 NOTE — Progress Notes (Signed)
Adult Psychoeducational Group Note  Date:  12/21/2012 Time:  8:00 pm  Group Topic/Focus:  Wrap-Up Group:   The focus of this group is to help patients review their daily goal of treatment and discuss progress on daily workbooks.  Participation Level:  Did not attend.    Veronica Perez 12/21/2012, 11:19 PM

## 2012-12-21 NOTE — Consult Note (Signed)
Note reviewed and agreed with  

## 2012-12-21 NOTE — Consult Note (Signed)
Face to face interview/consult with Dr. Ladona Ridgel  Subjective:  Patient mumbling and not pulling the covers off of her face.  Asked patient several times but would not participate in interview.    Patient has been accepted to [facility].    Disposition:  Will continue with current treatment to monitor for safety and stabilization until patient is transfer to [facility].  Kazandra Forstrom B. Bita Cartwright FNP-BC Family Nurse Practitioner, Board Certified

## 2012-12-21 NOTE — Progress Notes (Signed)
D   Pt was angry at around 730 pm and demanding to be discharged  She said people here were trying to poison her and were talking about her   She was loud threatening   She refused to eat and will only eat things prepackaged   She was pacing up and down the hall making derogatory blaming and threatening remarks to staff and other pts    Pt was encouraged to take her medication and at first she refused  But did finally take medications after she was told she would get IM's   A  Administered medications  And monitor for effectiveness  Attempted to offer verbal support and attempted to deescalate and set enforceable limits   Q 15 min checks R   Pt safe and finally around 9pm she began to calm and mostly stayed in her room   She did get dressed in tight pants and a lot of makeup

## 2012-12-22 DIAGNOSIS — F410 Panic disorder [episodic paroxysmal anxiety] without agoraphobia: Secondary | ICD-10-CM

## 2012-12-22 DIAGNOSIS — F3110 Bipolar disorder, current episode manic without psychotic features, unspecified: Principal | ICD-10-CM

## 2012-12-22 MED ORDER — NICOTINE 21 MG/24HR TD PT24
21.0000 mg | MEDICATED_PATCH | Freq: Every day | TRANSDERMAL | Status: DC
  Administered 2012-12-22 – 2012-12-25 (×4): 21 mg via TRANSDERMAL
  Filled 2012-12-22 (×11): qty 1

## 2012-12-22 MED ORDER — IBUPROFEN 200 MG PO TABS
600.0000 mg | ORAL_TABLET | Freq: Four times a day (QID) | ORAL | Status: DC | PRN
  Administered 2012-12-22 – 2012-12-27 (×7): 600 mg via ORAL
  Filled 2012-12-22 (×7): qty 3

## 2012-12-22 MED ORDER — CARBAMAZEPINE ER 200 MG PO TB12
200.0000 mg | ORAL_TABLET | Freq: Two times a day (BID) | ORAL | Status: DC
  Administered 2012-12-22 – 2012-12-30 (×17): 200 mg via ORAL
  Filled 2012-12-22 (×4): qty 1
  Filled 2012-12-22: qty 6
  Filled 2012-12-22 (×2): qty 1
  Filled 2012-12-22: qty 6
  Filled 2012-12-22: qty 1
  Filled 2012-12-22: qty 6
  Filled 2012-12-22 (×7): qty 1
  Filled 2012-12-22: qty 6
  Filled 2012-12-22 (×5): qty 1

## 2012-12-22 MED ORDER — NICOTINE 21 MG/24HR TD PT24
MEDICATED_PATCH | TRANSDERMAL | Status: AC
  Administered 2012-12-22: 21 mg via TRANSDERMAL
  Filled 2012-12-22: qty 1

## 2012-12-22 MED ORDER — CITALOPRAM HYDROBROMIDE 10 MG PO TABS
10.0000 mg | ORAL_TABLET | Freq: Every day | ORAL | Status: DC
  Administered 2012-12-22 – 2012-12-27 (×6): 10 mg via ORAL
  Filled 2012-12-22 (×8): qty 1

## 2012-12-22 MED ORDER — LURASIDONE HCL 40 MG PO TABS
40.0000 mg | ORAL_TABLET | Freq: Every day | ORAL | Status: DC
  Administered 2012-12-22 – 2012-12-27 (×6): 40 mg via ORAL
  Filled 2012-12-22 (×7): qty 1

## 2012-12-22 NOTE — BHH Suicide Risk Assessment (Signed)
Suicide Risk Assessment  Admission Assessment     Nursing information obtained from:  Patient Demographic factors:  Caucasian;Living alone Current Mental Status:  NA Loss Factors:  NA Historical Factors:  Family history of mental illness or substance abuse Risk Reduction Factors:  Sense of responsibility to family;Living with another person, especially a relative  CLINICAL FACTORS:   Severe Anxiety and/or Agitation Panic Attacks Bipolar Disorder:   Mixed State Unstable or Poor Therapeutic Relationship Previous Psychiatric Diagnoses and Treatments  COGNITIVE FEATURES THAT CONTRIBUTE TO RISK:  Closed-mindedness Polarized thinking    SUICIDE RISK:   Minimal: No identifiable suicidal ideation.  Patients presenting with no risk factors but with morbid ruminations; may be classified as minimal risk based on the severity of the depressive symptoms  PLAN OF CARE:1. Admit for crisis management and stabilization. 2. Medication management to reduce current symptoms to base line and improve the     patient's overall level of functioning 3. Treat health problems as indicated. 4. Develop treatment plan to decrease risk of relapse upon discharge and the need for     readmission. 5. Psycho-social education regarding relapse prevention and self care. 6. Health care follow up as needed for medical problems. 7. Restart home medications where appropriate.   I certify that inpatient services furnished can reasonably be expected to improve the patient's condition.  Thedore Mins, MD 12/22/2012, 10:35 AM

## 2012-12-22 NOTE — BHH Group Notes (Signed)
[facility] [employer] Group Therapy  12/22/2012  1:05 PM  Type of Therapy:  [employer] Presentation   Participation Level:  Did not attend.    Summary of Progress/Problems:  [name] from [employer] came to present his recovery story and play the guitar.  Veronica Perez   12/22/2012  1:05 PM

## 2012-12-22 NOTE — Progress Notes (Signed)
Patient ID: Veronica Perez, female   DOB: Jul 24, 1975, 37 y.o.   MRN: [id number] D: patient is dressed in tight, revealing clothing with excessive makeup.  She is irritable, angry and has no insight.  Patient stated, "my dad is coming today and he is taking me home.  I want a copy of all my records.  I want to know the date and time.  I know my rights; I want to see the doctor."  Patient is obsessed with a bruise on her hand.  She resisted taking her cipro, but after explanation of her UTI, she took it.  Patient met with MD and declared, "I not taking any meds.  I get moved from doctor to doctor.  I'm not going to tell you what I'm taking.  I'm being weaned off my meds.  All I need is a cigarette."  Patient spoke with rapid, pressured and disorganized speech.  She denies any SI/HI/AVH.  She paces the hallway glaring at staff.  She is suspicious and guarded.  She remains resistant to medication.  A: continue to monitor patient's behavior and medicate as necessary.  Safety checks completed every 15 minutes per protocol.  R: patient is unwilling and unable to participate in her treatment at this time.
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
## 2012-12-22 NOTE — Progress Notes (Signed)
Adult Psychoeducational Group Note  Date:  12/22/2012 Time:  11:00AM Group Topic/Focus:  Personal Development  Participation Level:  Did Not Attend   Additional Comments: Pt didn't attend group.   Bing Plume D 12/22/2012, 3:29 PM

## 2012-12-23 DIAGNOSIS — F39 Unspecified mood [affective] disorder: Secondary | ICD-10-CM

## 2012-12-23 DIAGNOSIS — F22 Delusional disorders: Secondary | ICD-10-CM

## 2012-12-23 DIAGNOSIS — F29 Unspecified psychosis not due to a substance or known physiological condition: Secondary | ICD-10-CM

## 2012-12-23 LAB — PREGNANCY, URINE: Preg Test, Ur: NEGATIVE

## 2012-12-23 MED ORDER — OLANZAPINE 10 MG PO TBDP
10.0000 mg | ORAL_TABLET | Freq: Three times a day (TID) | ORAL | Status: DC | PRN
  Administered 2012-12-23 – 2012-12-24 (×2): 10 mg via ORAL
  Filled 2012-12-23 (×2): qty 1

## 2012-12-23 MED ORDER — PHENAZOPYRIDINE HCL 200 MG PO TABS
200.0000 mg | ORAL_TABLET | Freq: Three times a day (TID) | ORAL | Status: AC
  Administered 2012-12-23 – 2012-12-26 (×6): 200 mg via ORAL
  Filled 2012-12-23 (×6): qty 1
  Filled 2012-12-23: qty 2
  Filled 2012-12-23 (×3): qty 1

## 2012-12-23 NOTE — Progress Notes (Signed)
Recreation Therapy Notes  Date: 11.19.2014 Time: 9:30am Location: [address] Dayroom  Group Topic: Communication  Goal Area(s) Addresses:  Patient will effectively communicate with peers in group.  Patient will verbalize benefit of healthy communication. Patient will identify communication techniques that made activity effective for group.   Behavioral Response: Did not attend    Jearl Klinefelter, LRT/CTRS  Jearl Klinefelter 12/23/2012 9:35 AM

## 2012-12-23 NOTE — Progress Notes (Signed)
D: Patient denies SI/HI/AVH. Patient rates hopelessness as 3,  depression as 3, and anxiety as 10.  Patient affect is anxious. Mood is anxious.  Pt states, "I feel a lot better.  I've gotten rest and I think my meds are working."  Patient did NOT attend evening group. Patient visible on the milieu. No distress noted. A: Support and encouragement offered. Scheduled medications given to pt. Q 15 min checks continued for patient safety. R: Patient receptive. Patient remains safe on the unit.

## 2012-12-23 NOTE — Progress Notes (Signed)
D:  Pt isolates in room, confused to self when asked this am but responds to name, pt has constant look of bewilderment and anxiety, pt in room fidgety, denies SI/HI/AVH, focused on d/c, c/o bladder pain/pressure after urination  A:  NP notified Emotional support provided, Encouraged pt to continue with treatment plan and attend all group activities, q15 min checks maintained for safety.  R:  Pt is not going to groups, isolative, irritable and labile at times

## 2012-12-23 NOTE — [facility] Group Notes (Signed)
[facility] Group Notes:  (Counselor/Nursing/MHT/Case Management/Adjunct)  12/23/2012 1:15PM  Type of Therapy:  Group Therapy  Participation Level:  Invited.  Chose not to attend      Summary of Progress/Problems: The topic for group was balance in life.  Pt participated in the discussion about when their life was in balance and out of balance and how this feels.  Pt discussed ways to get back in balance and short term goals they can work on to get where they want to be.    Veronica Perez 12/23/2012 1:35 PM

## 2012-12-23 NOTE — [facility] Suicide Risk Assessment (Signed)
[facility] INPATIENT:  Family/Significant Other Suicide Prevention Education  Suicide Prevention Education:  Education Completed; Mother, Ashanti Ratti [phone] has been identified by the patient as the family member/significant other with whom the patient will be residing, and identified as the person(s) who will aid the patient in the event of a mental health crisis (suicidal ideations/suicide attempt).  With written consent from the patient, the family member/significant other has been provided the following suicide prevention education, prior to the and/or following the discharge of the patient.  The suicide prevention education provided includes the following:  Suicide risk factors  Suicide prevention and interventions  National Suicide Hotline telephone number  [facility] assessment telephone number  [facility] Emergency Assistance 911  [facility] and/or Residential Mobile Crisis Unit telephone number  Request made of family/significant other to:  Remove weapons (e.g., guns, rifles, knives), all items previously/currently identified as safety concern.    Remove drugs/medications (over-the-counter, prescriptions, illicit drugs), all items previously/currently identified as a safety concern.  The family member/significant other verbalizes understanding of the suicide prevention education information provided.  The family member/significant other agrees to remove the items of safety concern listed above.  Simona Huh 12/23/2012, 10:49 AM

## 2012-12-23 NOTE — Progress Notes (Signed)
[facility] MD Progress Note  12/23/2012 11:18 AM Veronica Perez  MRN:  [id number] Subjective: "I am  Still feeling anxious and having panic attacks." Objective: Patient reports ongoing anxiety, depression and panic attacks. She continues to have difficulty sleeping, racing thoughts, delusional but appears less agitated today. Patient thought process remains bizarre and disorganized. She is now accepting her medications and has not reported any adverse reactions. She is guarded, withdrawn and isolated herself with minimal interaction with staffs and peer. 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   HENT: Negative.   Eyes: Negative.   Respiratory: Negative.   Cardiovascular: Negative.   Gastrointestinal: Negative.   Genitourinary: Negative.   Musculoskeletal: Negative.   Skin: Negative.   Neurological: Negative.   Endo/Heme/Allergies: Negative.   Psychiatric/Behavioral: Positive for depression and hallucinations. The patient is nervous/anxious and has insomnia.     Blood pressure 123/79, pulse 99, temperature 98.1 F (36.7 C), temperature source Oral, resp. rate 16, height 5\' 4"  (1.626 m), weight 79.379 kg (175 lb).Body mass index is 30.02 kg/(m^2).  General Appearance: Fairly Groomed  Patent attorney::  Minimal  Speech:  Garbled and Pressured  Volume:  Increased  Mood:   Anxious and Irritable  Affect:  Labile  Thought Process:  Disorganized  Orientation:  Full (Time, Place, and Person)  Thought Content:  Delusions  Suicidal Thoughts:  No  Homicidal Thoughts:  No  Memory:  Immediate;   Fair Recent;   Fair Remote;   Fair  Judgement:  Poor  Insight:  Lacking  Psychomotor Activity:  Increased  Concentration:  Poor  Recall:  Poor  Akathisia:  No  Handed:  Right  AIMS (if indicated):     Assets:  Financial Resources/Insurance Physical Health Social Support  Sleep:  Number of Hours: 0   Current Medications: Current Facility-Administered Medications  Medication Dose Route Frequency Provider Last Rate Last Dose  . acetaminophen (TYLENOL) tablet 650 mg  650 mg Oral Q6H PRN Shuvon Rankin, NP   650 mg at 12/21/12 1546  . alum & mag hydroxide-simeth (MAALOX/MYLANTA) 200-200-20 MG/5ML suspension 30 mL  30 mL Oral Q4H PRN Shuvon Rankin, NP      . carbamazepine (TEGRETOL XR) 12 hr tablet 200 mg  200 mg Oral BID Shemika Robbs   200 mg at 12/23/12 6578  . ciprofloxacin (CIPRO) tablet 500 mg  500 mg Oral BID Shuvon Rankin, NP   500 mg at 12/23/12 4696  . citalopram (CELEXA) tablet 10 mg  10 mg Oral Daily Mayco Walrond   10 mg at 12/23/12 2952  . ibuprofen (ADVIL,MOTRIN) tablet 600 mg  600 mg Oral Q6H PRN Kerry Hough, PA-C   600 mg at 12/22/12 2209  . lurasidone (LATUDA) tablet 40 mg  40 mg Oral QHS Amilio Zehnder   40 mg at 12/22/12 2204  . magnesium hydroxide (MILK OF MAGNESIA) suspension 30 mL  30 mL Oral Daily PRN  Shuvon Rankin, NP      . nicotine (NICODERM CQ - dosed in mg/24 hours) patch 21 mg  21 mg Transdermal Daily Tarquin Welcher   21 mg at 12/23/12 4782  . OLANZapine zydis (ZYPREXA) disintegrating tablet 10 mg  10 mg Oral Q8H PRN Kristoph Sattler        Lab Results:  Results for orders placed during the hospital encounter of 12/21/12 (from the past 48 hour(s))  PREGNANCY, URINE     Status: None   Collection Time    12/22/12 10:20 PM       Result Value Range   Preg Test, Ur NEGATIVE  NEGATIVE   Comment:            THE SENSITIVITY OF THIS     METHODOLOGY IS >20 mIU/mL.     Performed at [facility]    Physical Findings: AIMS: Facial and Oral Movements Muscles of Facial Expression: None, normal Lips and Perioral Area: None, normal Jaw: None, normal Tongue: None, normal,Extremity Movements Upper (arms, wrists, hands, fingers): None, normal Lower (legs, knees, ankles, toes): None, normal, Trunk Movements Neck, shoulders, hips: None, normal, Overall Severity Severity of abnormal movements (highest score from questions above): None, normal Incapacitation due to abnormal movements: None, normal Patient's awareness of abnormal movements (rate only patient's report): No Awareness, Dental Status Current problems with teeth and/or dentures?: No Does patient usually wear dentures?: No  CIWA:    COWS:     Treatment Plan Summary: Daily contact with patient to assess and evaluate symptoms and progress in treatment Medication management  Plan:1. Admit for crisis management and stabilization. 2. Medication management to reduce current symptoms to base line and improve the     patient's overall level of functioning 3. Treat health problems as indicated. 4. Develop treatment plan to decrease risk of relapse upon discharge and the need for     readmission. 5. Psycho-social education regarding relapse prevention and self care. 6. Health care follow up as needed for medical problems. 7. Restart home medications where appropriate. 8. Continue current medication regimen.   Medical Decision Making Problem Points:  Established problem, worsening (2), Review of last therapy session (1) and Review of psycho-social stressors (1) Data Points:  Order Aims Assessment (2) Review of medication regiment & side effects (2) Review of new medications or change in dosage (2)  I certify that inpatient services furnished can  reasonably be expected to improve the patient's condition.   Thedore Mins, MD 12/23/2012, 11:18 AM

## 2012-12-23 NOTE — Progress Notes (Signed)
D. Pt in room at this time, still awake and has been in bathroom. Pt did receive hs medications without incident and did go to [employer] group. Pt has complained of burning on urinating. Pt also spoke about not being able to sleep well and also has complained of on-going anxiety. A. Support and encouragement provided, medication education provided. R. Pt verbalized understanding, safety maintained, will continue to monitor.

## 2012-12-24 MED ORDER — OLANZAPINE 5 MG PO TBDP
5.0000 mg | ORAL_TABLET | Freq: Three times a day (TID) | ORAL | Status: DC | PRN
  Administered 2012-12-24 – 2012-12-28 (×6): 5 mg via ORAL
  Filled 2012-12-24 (×6): qty 1

## 2012-12-24 MED ORDER — TRAZODONE HCL 50 MG PO TABS
50.0000 mg | ORAL_TABLET | Freq: Every evening | ORAL | Status: DC | PRN
Start: 2012-12-24 — End: 2012-12-30
  Administered 2012-12-24 – 2012-12-28 (×4): 50 mg via ORAL
  Filled 2012-12-24: qty 3
  Filled 2012-12-24 (×4): qty 1

## 2012-12-24 NOTE — Progress Notes (Signed)
Pt has been in bed sleeping most of the day, pt stated this am that she is feeling somewhat better today than yesterday because she is finally able to get some sleep, denies pain, denies SI/HI/AVH, as compared to yesterday pt is drowsy but is more coherent and able to answer questions in full sentences, not going to groups, still fidgety, flat affect, pt states that her thoughts feel more clear today than yesterday.

## 2012-12-24 NOTE — Progress Notes (Signed)
Recreation Therapy Notes  Date: 11.21.2014 Time: 9:30am Location: [address] Dayroom  Group Topic: Emotional Recognition  Goal Area(s) Addresses:  Patient will effectively express themselves through art.  Behavioral Response: Did not attend.   Charnay Nazario L Allsion [place], LRT/CTRS  Erwin Nishiyama L 12/24/2012 1:24 PM 

## 2012-12-24 NOTE — Progress Notes (Signed)
Patient ID: Veronica Perez, female   DOB: 04/27/1975, 37 y.o.   MRN: [id number] [facility] MD Progress Note  12/24/2012 2:12 PM Veronica Perez  MRN:  [id number] Subjective: Patient has been asleep this morning and was unable to respond to writer's questions.   Objective: Veronica Perez has been displaying manic behaviors over the past few days with very poor sleep and disorganized thinking. Patient per [facility] history received a dose of Zyprexa Zydis at 1 am, which may explain her sedation today in addition to several nights of nearly no sleep. Nursing staff report that her thinking has started to be more coherent and logical. Yesterday the patient had trouble responding to even the most basic questions. This writer attempted to interview the patient twice today but she appeared to be sleeping soundly and did not respond.   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   HENT: Negative.   Eyes: Negative.   Respiratory: Negative.   Cardiovascular: Negative.   Gastrointestinal: Negative.   Genitourinary: Negative.   Musculoskeletal: Negative.   Skin: Negative.   Neurological: Negative.   Endo/Heme/Allergies: Negative.   Psychiatric/Behavioral: Positive for depression and hallucinations. The patient is nervous/anxious and has insomnia.     Blood pressure 134/84, pulse 101, temperature  98.2 F (36.8 C), temperature source Oral, resp. rate 19, height 5\' 4"  (1.626 m), weight 79.379 kg (175 lb).Body mass index is 30.02 kg/(m^2).  General Appearance: Fairly Groomed  Patent attorney::  Minimal  Speech:  Garbled and Pressured  Volume:  Increased  Mood:  Anxious and Irritable  Affect:  Labile  Thought Process:  Disorganized  Orientation:  Full (Time, Place, and Person)  Thought Content:  Delusions  Suicidal Thoughts:  No  Homicidal Thoughts:  No  Memory:  Immediate;   Fair Recent;   Fair Remote;   Fair  Judgement:  Poor  Insight:  Lacking  Psychomotor Activity:  Increased  Concentration:  Poor  Recall:  Poor  Akathisia:  No  Handed:  Right  AIMS (if indicated):     Assets:  Financial Resources/Insurance Physical Health Social Support  Sleep:  Number of Hours: 0   Current Medications: Current Facility-Administered Medications  Medication Dose Route Frequency Provider Last Rate Last Dose  . acetaminophen (TYLENOL) tablet 650 mg  650 mg Oral Q6H PRN Shuvon Rankin, NP   650 mg at 12/21/12 1546  . alum & mag hydroxide-simeth (MAALOX/MYLANTA) 200-200-20 MG/5ML suspension 30 mL  30 mL Oral Q4H PRN Shuvon Rankin, NP      . carbamazepine (TEGRETOL XR) 12 hr tablet 200 mg  200 mg Oral BID Mojeed Akintayo   200 mg at 12/24/12 0817  . ciprofloxacin (CIPRO) tablet 500 mg  500 mg Oral BID Shuvon Rankin, NP   500 mg at 12/24/12 0817  . citalopram (CELEXA) tablet 10 mg  10 mg Oral Daily Mojeed Akintayo   10 mg at 12/24/12 0818  .  ibuprofen (ADVIL,MOTRIN) tablet 600 mg  600 mg Oral Q6H PRN Kerry Hough, PA-C   600 mg at 12/24/12 0059  . lurasidone (LATUDA) tablet 40 mg  40 mg Oral QHS Mojeed Akintayo   40 mg at 12/23/12 2144  . magnesium hydroxide (MILK OF MAGNESIA) suspension 30 mL  30 mL Oral Daily PRN Shuvon Rankin, NP      . nicotine (NICODERM CQ - dosed in mg/24 hours) patch 21 mg  21 mg Transdermal Daily Mojeed Akintayo   21 mg at 12/24/12 0819  . OLANZapine zydis (ZYPREXA)  disintegrating tablet 5 mg  5 mg Oral Q8H PRN Fransisca Kaufmann, NP      . phenazopyridine (PYRIDIUM) tablet 200 mg  200 mg Oral TID WC Fransisca Kaufmann, NP   200 mg at 12/23/12 1719    Lab Results:  Results for orders placed during the hospital encounter of 12/21/12 (from the past 48 hour(s))  PREGNANCY, URINE     Status: None   Collection Time    12/22/12 10:20 PM      Result Value Range   Preg Test, Ur NEGATIVE  NEGATIVE   Comment:            THE SENSITIVITY OF THIS     METHODOLOGY IS >20 mIU/mL.     Performed at [facility]    Physical Findings: AIMS: Facial and Oral Movements Muscles of Facial Expression: None, normal Lips and Perioral Area: None, normal Jaw: None, normal Tongue: None, normal,Extremity Movements Upper (arms, wrists, hands, fingers): None, normal Lower (legs, knees, ankles, toes): None, normal, Trunk Movements Neck, shoulders, hips: None, normal, Overall Severity Severity of abnormal movements (highest score from questions above): None, normal Incapacitation due to abnormal movements: None, normal Patient's awareness of abnormal movements (rate only patient's report): No Awareness, Dental Status Current problems with teeth and/or dentures?: No Does patient usually wear dentures?: No  CIWA:    COWS:     Treatment Plan Summary: Daily contact with patient to assess and evaluate symptoms and progress in treatment Medication management  Plan:1. Continue crisis management and stabilization. 2. Medication management to reduce current symptoms to base line and improve the patient's overall level of functioning. Decrease prn dose of Zyprexa Zydis to 5 mg every eight hours prn due to possible sedation.  3. Treat health problems as indicated. 4. Develop treatment plan to decrease risk of relapse upon discharge and the need for readmission. 5. Psycho-social education regarding relapse prevention and self care. 6. Health care follow up as needed for medical  problems. Patient is currently being treated for a UTI with a course of cipro. Pyridium has been ordered due to complaints of dysuria.  7. Restart home medications where appropriate. 8. Continue current medication regimen.  Medical Decision Making Problem Points:  Established problem, stable/improving (1) and Review of psycho-social stressors (1) Data Points:  Order Aims Assessment (2) Review of medication regiment & side effects (2) Review of new medications or change in dosage (2)  I certify that inpatient services furnished can reasonably be expected to improve the patient's condition.   Fransisca Kaufmann, NP-C 12/24/2012, 2:12 PM

## 2012-12-24 NOTE — [facility] Counselor (Signed)
Adult Comprehensive Assessment  Patient ID: Veronica Perez, female   DOB: [birth year], 37 y.o.   MRN: [id number]  Information Source:    Current Stressors:  Educational / Learning stressors: N/A Employment / Job issues: Yes, Occupational  Family Relationships: Yes, DSS issues with Physicist, medical / Lack of resources (include bankruptcy): Yes, Fixed Income  Housing / Lack of housing: N/A Physical health (include injuries & life threatening diseases): N/A Social relationships: Yes, lack of social relationship except for family  Substance abuse: N/A  Bereavement / Loss: Yes, has not dealt with sister's death   Living/Environment/Situation:  Living Arrangements: Parent Living conditions (as described by patient or guardian): Mom - "It's better than at the hospital."    How long has patient lived in current situation?: 10 years  What is atmosphere in current home: Comfortable  Family History:  Marital status: Single Does patient have children?: Yes How many children?: 1 How is patient's relationship with their children?: Daughter  11 years - "We have the best relationship.  I taught her how to cook and clean.  I cry at night for her.  I want to go home and be with her."    Childhood History:  By whom was/is the patient raised?: Both parents Description of patient's relationship with caregiver when they were a child: Did not want disclose.   Patient's description of current relationship with people who raised him/her: "We have a good relationship.  I miss my mother."   Does patient have siblings?: Yes Number of Siblings: 2 Description of patient's current relationship with siblings: 1 brother and 1 sister - "That's one thing I don't want to talk about is my sister.  She's died." Relationship with brother is fine "We look out for one another."   Did patient suffer any verbal/emotional/physical/sexual abuse as a child?: No Did patient suffer from severe childhood neglect?: No Has  patient ever been sexually abused/assaulted/raped as an adolescent or adult?:  (Did not want to disclose.  Pt became very agitated.  ) Was the patient ever a victim of a crime or a disaster?:  (Did not want to disclose.  ) Witnessed domestic violence?:  (Did not want to disclose) Has patient been effected by domestic violence as an adult?:  (Did not want to disclose.  )  Education:  Highest grade of school patient has completed: "I don't remember."   Currently a student?: No Learning disability?: Yes What learning problems does patient have?: "There's something there, but I'm not too sure."    Employment/Work Situation:   Employment situation: On disability Why is patient on disability: Bipolar and depression disorders  How long has patient been on disability: "I can't remember, it's been so long."   Patient's job has been impacted by current illness: No What is the longest time patient has a held a job?: "I don't remember."   Where was the patient employed at that time?: "I don't remember."  Has patient ever been in the [employer]?: No Has patient ever served in combat?: No  Financial Resources:   Surveyor, quantity resources: [employer];Medicare;Food stamps Does patient have a representative payee or guardian?: Yes Name of representative payee or guardian: Mother   Alcohol/Substance Abuse:   What has been your use of drugs/alcohol within the last 12 months?: Pt was not clear about substance use.  Pt became distracted by talking about rubbing alcohol for her bruises.  Pt did indicated "That medication was bad.  I wish I never have talked it."  Pt did not want to disclose further about the medication.     If attempted suicide, did drugs/alcohol play a role in this?: No Alcohol/Substance Abuse Treatment Hx: Denies past history Has alcohol/substance abuse ever caused legal problems?: No  Social Support System:   Patient's Community Support System: Good Describe Community Support  System: Family  Type of faith/religion: Christian  How does patient's faith help to cope with current illness?: Did not want to disclose   Leisure/Recreation:   Leisure and Hobbies: Being with daughter and family   Strengths/Needs:   What things does the patient do well?: Pt became very tearful "I was fine until I got on this medication."   In what areas does patient struggle / problems for patient: Depression, DSS, "withdrawing" from medication, and missing family   Discharge Plan:   Does patient have access to transportation?: Yes Will patient be returning to same living situation after discharge?: Yes Currently receiving community mental health services: Yes (From Whom) ([facility], Dr. Ellamae Sia ) Does patient have financial barriers related to discharge medications?: No  Summary/Recommendations:   Summary and Recommendations (to be completed by the evaluator): Veronica Perez is a 37 YO Caucasian female who presented as tearful, distracted, irritable, guarded with labile mood.   Pt became uncomfortable with answering some of the questions, "I don't want any of my personal stuff to be in here."  Pt had to get up several times and indicated that she has dry mouth.  She became very tearful when discussing her daugther.  She indicated that DSS came to her house and wanted to take her child away.  She is supported by her mother.  She can benefit from crisis stablization, therapetic milieu, medication managment, and referral for services.    Daryel Gerald B. 12/24/2012

## 2012-12-24 NOTE — [facility] Group Notes (Signed)
[facility] LCSW Aftercare Discharge Planning Group Note   12/24/2012 10:24 AM  Participation Quality:  Invited.  Chose not to attend    [place], Thereasa Distance B

## 2012-12-24 NOTE — Progress Notes (Signed)
Psychoeducational Group Note  Date:  12/24/2012 Time:  2000 Group Topic/Focus:  Wrap-Up Group:   The focus of this group is to help patients review their daily goal of treatment and discuss progress on daily workbooks.  Participation Level: Did Not Attend  Participation Quality:  Not Applicable  Affect:  Not Applicable  Cognitive:  Not Applicable  Insight:  Not Applicable  Engagement in Group: Not Applicable  Additional Comments  PT DID NOT ATTEND GROUP  Aundrea Horace A 12/24/2012, 10:55 PM

## 2012-12-24 NOTE — Progress Notes (Signed)
Patient ID: Veronica Perez, female   DOB: [birth year], 37 y.o.   MRN: [id number] D: patient remains isolative to room.  She elects to stay back from the cafeteria and does not attend groups.  Met with patient in her room and she was tearful and anxious. She has been compliant with her medications.  Patient's behavior has been appropriate.  She denies any SI/HI/AVH.  A: continue to monitor medication management and MD orders.  Safety checks completed every 15 minutes per protocol.  R: patient remains isolative and anxious.

## 2012-12-24 NOTE — Progress Notes (Signed)
Patient ID: Veronica Perez, female   DOB: 03/04/1975, 37 y.o.   MRN: [id number] D: MHT observed patient remaining in bathroom for extended period of time.  When this nurse went to check on her, patient was  in shower with part of her clothing off.   She was holding her stomach and crying.  Directed patient out of the bathroom and asked if she was having any pain.  Patient would not answer and started crying. Patient waited until staff left and proceeded to go back to the bathroom and had to be directed back to the room.  She was visibly agitated and zyprexa 5 mg was admin.  Patient kept holding her stomach and stated, "my bladder hurts."  Attempted to give tylenol for pain and she refused.  Patient has been exhibiting this behavior since her mother left at 32. Patient is completely isolative to room.

## 2012-12-24 NOTE — [facility] Group Notes (Signed)
[facility] LCSW Group Therapy  12/24/2012  1:05 PM  Type of Therapy:  Group therapy  Did not attend    Summary of Progress/Problems:  Chaplain was here to lead a group on themes of hope and courage.  Daryel Gerald B 12/24/2012 1:42 PM

## 2012-12-25 NOTE — Progress Notes (Signed)
Patient ID: MARCHETTA NAVRATIL, female   DOB: 1975/08/01, 37 y.o.   MRN: [id number] Psychoeducational Group Note  Date:  12/25/2012 Time:0930am  Group Topic/Focus:  Identifying Needs:   The focus of this group is to help patients identify their personal needs that have been historically problematic and identify healthy behaviors to address their needs.  Participation Level:  Did Not Attend  Participation Quality:    Affect: Cognitive: Insight:   Engagement in Group:  Additional Comments:  Inventory and Psychoeducational group   Valente David 12/25/2012,9:51 AM

## 2012-12-25 NOTE — Progress Notes (Signed)
Patient ID: Veronica Perez, female   DOB: 1975-03-16, 37 y.o.   MRN: [id number] D. The patient is paranoid and delusional. She believes that her dinner was poisoned and through it into her garbage can. She then attempted to dump out in the hall. Pacing back and forth in an agitated state. Attempts to sit in the dark in her shower with fingers in her ears. A. Assessed and made a 1:1 for safety. Needed a lot of coaxing to take her medication. Verbal support provided.  R. 1:1 maintained for safety.

## 2012-12-25 NOTE — Progress Notes (Signed)
Patient ID: Veronica Perez, female   DOB: October 30, 1975, 37 y.o.   MRN: [id number] D. The patient was found sitting on the toilet with her eyes closed.  A. She was awakened and assisted to her bed. Assessed for pain. R. No c/o pain offered. Q15 minute checks maintained for safety.

## 2012-12-25 NOTE — [facility] Group Notes (Signed)
[facility] Group Notes: (Clinical Social Work)   12/25/2012      Type of Therapy:  Group Therapy   Participation Level:  Did Not Attend    Anarie Kalish Grossman-Orr, LCSW 12/25/2012, 1:06 PM     

## 2012-12-25 NOTE — Progress Notes (Signed)
Patient ID: Veronica Perez, female   DOB: 06-01-1975, 37 y.o.   MRN: [id number] [facility] MD Progress Note  12/25/2012 12:45 PM Veronica Perez  MRN:  [id number] Subjective: "Lying in bed. Did not wanted to talk much. Feels down. Objective: Patient reports ongoig depressed mood and withdrawn. Endorses anxiety and still trying to isolate herself. No manic symptoms. has minimal interaction with staffs and peer. 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   HENT: Negative.   Eyes: Negative.   Respiratory: Negative.   Cardiovascular: Negative.   Gastrointestinal: Negative.   Genitourinary: Negative.   Musculoskeletal: Negative.   Skin: Negative.   Neurological: Negative.   Endo/Heme/Allergies: Negative.   Psychiatric/Behavioral: Positive for depression and hallucinations. The patient is nervous/anxious and has insomnia.     Blood pressure 134/84, pulse 101, temperature 98.2 F (36.8 C), temperature source Oral, resp. rate 19, height 5\' 4"  (1.626 m), weight 79.379 kg (175 lb).Body mass index is 30.02 kg/(m^2).  General Appearance: Fairly Groomed  Patent attorney::  Minimal  Speech:  Garbled and Pressured  Volume:  Increased  Mood:  Anxious and Irritable  Affect:  Labile  Thought Process:  Disorganized  Orientation:  Full (Time, Place, and Person)  Thought Content:  Delusions   Suicidal Thoughts:  No  Homicidal Thoughts:  No  Memory:  Immediate;   Fair Recent;   Fair Remote;   Fair  Judgement:  Poor  Insight:  Lacking  Psychomotor Activity:  Increased  Concentration:  Poor  Recall:  Poor  Akathisia:  No  Handed:  Right  AIMS (if indicated):     Assets:  Financial Resources/Insurance Physical Health Social Support  Sleep:  Number of Hours: 2.75   Current Medications: Current Facility-Administered Medications  Medication Dose Route Frequency Provider Last Rate Last Dose  . acetaminophen (TYLENOL) tablet 650 mg  650 mg Oral Q6H PRN Shuvon Rankin, NP   650 mg at 12/21/12 1546  . alum & mag hydroxide-simeth (MAALOX/MYLANTA) 200-200-20 MG/5ML suspension 30 mL  30 mL Oral Q4H PRN Shuvon Rankin, NP      . carbamazepine (TEGRETOL XR) 12 hr tablet 200 mg  200 mg Oral BID Mojeed Akintayo   200 mg at 12/25/12 0826  . ciprofloxacin (CIPRO) tablet 500 mg  500 mg Oral BID Shuvon Rankin, NP   500 mg at 12/25/12 0826  . citalopram (CELEXA) tablet 10 mg  10 mg Oral Daily Mojeed Akintayo   10 mg at 12/25/12 0826  . ibuprofen (ADVIL,MOTRIN) tablet 600 mg  600 mg Oral Q6H PRN Kerry Hough, PA-C   600 mg at 12/24/12 0059  . lurasidone (LATUDA) tablet 40 mg  40 mg Oral QHS Mojeed Akintayo   40 mg at 12/24/12 1944  . magnesium hydroxide (MILK OF MAGNESIA) suspension 30 mL  30 mL Oral Daily PRN Shuvon Rankin, NP      . nicotine (NICODERM CQ - dosed  in mg/24 hours) patch 21 mg  21 mg Transdermal Daily Mojeed Akintayo   21 mg at 12/25/12 0826  . OLANZapine zydis (ZYPREXA) disintegrating tablet 5 mg  5 mg Oral Q8H PRN Fransisca Kaufmann, NP   5 mg at 12/24/12 2038  . phenazopyridine (PYRIDIUM) tablet 200 mg  200 mg Oral TID WC Fransisca Kaufmann, NP   200 mg at 12/24/12 1649  . traZODone (DESYREL) tablet 50 mg  50 mg Oral QHS PRN Kristeen Mans, NP   50 mg at 12/24/12 2230    Lab Results:  No results found for this or any previous visit (from the past 48 hour(s)).  Physical  Findings: AIMS: Facial and Oral Movements Muscles of Facial Expression: None, normal Lips and Perioral Area: None, normal Jaw: None, normal Tongue: None, normal,Extremity Movements Upper (arms, wrists, hands, fingers): None, normal Lower (legs, knees, ankles, toes): None, normal, Trunk Movements Neck, shoulders, hips: None, normal, Overall Severity Severity of abnormal movements (highest score from questions above): None, normal Incapacitation due to abnormal movements: None, normal Patient's awareness of abnormal movements (rate only patient's report): No Awareness, Dental Status Current problems with teeth and/or dentures?: No Does patient usually wear dentures?: No  CIWA:    COWS:     Treatment Plan Summary: Daily contact with patient to assess and evaluate symptoms and progress in treatment Medication management  Plan:1. Admit for crisis management and stabilization. 2. Medication management to reduce current symptoms to base line and improve the     patient's overall level of functioning 3. Treat health problems as indicated. 4. Develop treatment plan to decrease risk of relapse upon discharge and the need for     readmission. 5. Psycho-social education regarding relapse prevention and self care. 6. Health care follow up as needed for medical problems. 7. Restart home medications where appropriate. 8. Continue current medication regimen. May consider increase celexa by tomorrow if still withdrawn.   Medical Decision Making Problem Points:  Established problem, worsening (2), Review of last therapy session (1) and Review of psycho-social stressors (1) Data Points:  Order Aims Assessment (2) Review of medication regiment & side effects (2) Review of new medications or change in dosage (2)  I certify that inpatient services furnished can reasonably be expected to improve the patient's condition.   Thresa Ross, MD 12/25/2012, 12:45 PM

## 2012-12-25 NOTE — Progress Notes (Signed)
Patient ID: Veronica Perez, female   DOB: [birth year], 37 y.o.   MRN: [id number] 12-25-12 nursing shift note: D: pt has not gone to any groups, is not visible in the milieu and is taking her med's with a great deal of encouragement and assistance. Pt seems to be responding to internal stimuli. She has some homemade ear plugs made from paper in her ears. A: RN continues to encourage and praise this patient. RN is giving her medications in her room. She also got a Zyprexa prn at 1547 for agitation/anxiety. R: this pt is not opening her eyes when interacting with the staff. RN will monitor and Q 15 min ck's continue. Staff will continue to encourage her to participate in the milieu.

## 2012-12-26 MED ORDER — LORAZEPAM 1 MG PO TABS
1.0000 mg | ORAL_TABLET | Freq: Three times a day (TID) | ORAL | Status: DC | PRN
  Administered 2012-12-27 – 2012-12-28 (×2): 1 mg via ORAL
  Filled 2012-12-26 (×2): qty 1

## 2012-12-26 MED ORDER — ENSURE COMPLETE PO LIQD
237.0000 mL | Freq: Two times a day (BID) | ORAL | Status: DC
  Administered 2012-12-26 – 2012-12-30 (×7): 237 mL via ORAL

## 2012-12-26 NOTE — Progress Notes (Signed)
Patient ID: Veronica Perez, female   DOB: Sep 05, 1975, 37 y.o.   MRN: [id number] [facility] MD Progress Note  12/26/2012 10:20 AM Veronica Perez  MRN:  [id number] Subjective: "Lying in bed. Did get up and talked after convincing. Says now wants to go home. Endorsed paranoia. Says food was poison. Got somewhat agitated i dont know who did it, but it was " poisoned"  Objective: Patient reports ongoig depressed mood and withdrawn. Endorses anxiety and still trying to isolate herself. No manic symptoms. has minimal interaction with staffs and peer. 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   HENT: Negative.   Eyes: Negative.   Respiratory: Negative.   Cardiovascular: Negative.   Gastrointestinal: Negative.   Genitourinary: Negative.   Musculoskeletal: Negative.   Skin: Negative.   Neurological: Negative.   Endo/Heme/Allergies: Negative.   Psychiatric/Behavioral: Positive for depression and hallucinations. The patient is nervous/anxious and has insomnia.     Blood pressure 142/82, pulse 79, temperature 97.3 F (36.3 C), temperature source Oral, resp. rate 16, height 5\' 4"  (1.626 m), weight 79.379 kg (175 lb).Body mass index is 30.02 kg/(m^2).  General Appearance: Fairly Groomed  Patent attorney::  Minimal  Speech:  Garbled and Pressured  Volume:  Increased  Mood:  Anxious and  Irritable  Affect:  Labile  Thought Process:  Disorganized  Orientation:  Full (Time, Place, and Person)  Thought Content:  Delusions  Suicidal Thoughts:  No  Homicidal Thoughts:  No  Memory:  Immediate;   Fair Recent;   Fair Remote;   Fair  Judgement:  Poor  Insight:  Lacking  Psychomotor Activity:  Increased  Concentration:  Poor  Recall:  Poor  Akathisia:  No  Handed:  Right  AIMS (if indicated):     Assets:  Financial Resources/Insurance Physical Health Social Support  Sleep:  Number of Hours: 4.75   Current Medications: Current Facility-Administered Medications  Medication Dose Route Frequency Provider Last Rate Last Dose  . acetaminophen (TYLENOL) tablet 650 mg  650 mg Oral Q6H PRN Shuvon Rankin, NP   650 mg at 12/21/12 1546  . alum & mag hydroxide-simeth (MAALOX/MYLANTA) 200-200-20 MG/5ML suspension 30 mL  30 mL Oral Q4H PRN Shuvon Rankin, NP      . carbamazepine (TEGRETOL XR) 12 hr tablet 200 mg  200 mg Oral BID Mojeed Akintayo   200 mg at 12/26/12 0757  . ciprofloxacin (CIPRO) tablet 500 mg  500 mg Oral BID Shuvon Rankin, NP   500 mg at 12/26/12 0757  . citalopram (CELEXA) tablet 10 mg  10 mg Oral Daily Mojeed Akintayo   10 mg at 12/26/12 0757  . feeding supplement (ENSURE COMPLETE) (ENSURE COMPLETE) liquid 237 mL  237 mL Oral BID BM Thresa Ross, MD      . ibuprofen (ADVIL,MOTRIN) tablet 600 mg  600 mg Oral Q6H PRN Kerry Hough, PA-C   600 mg at 12/26/12 0757  .  lurasidone (LATUDA) tablet 40 mg  40 mg Oral QHS Mojeed Akintayo   40 mg at 12/25/12 1955  . magnesium hydroxide (MILK OF MAGNESIA) suspension 30 mL  30 mL Oral Daily PRN Shuvon Rankin, NP      . nicotine (NICODERM CQ - dosed in mg/24 hours) patch 21 mg  21 mg Transdermal Daily Mojeed Akintayo   21 mg at 12/25/12 0826  . OLANZapine zydis (ZYPREXA) disintegrating tablet 5 mg  5 mg Oral Q8H PRN Fransisca Kaufmann, NP   5 mg at 12/26/12 0757  . phenazopyridine (PYRIDIUM) tablet 200 mg  200 mg Oral TID WC Fransisca Kaufmann,  NP   200 mg at 12/26/12 1610  . traZODone (DESYREL) tablet 50 mg  50 mg Oral QHS PRN Kristeen Mans, NP   50 mg at 12/24/12 2230    Lab Results:  No results found for this or any previous visit (from the past 48 hour(s)).  Physical Findings: AIMS: Facial and Oral Movements Muscles of Facial Expression: None, normal Lips and Perioral Area: None, normal Jaw: None, normal Tongue: None, normal,Extremity Movements Upper (arms, wrists, hands, fingers): None, normal Lower (legs, knees, ankles, toes): None, normal, Trunk Movements Neck, shoulders, hips: None, normal, Overall Severity Severity of abnormal movements (highest score from questions above): None, normal Incapacitation due to abnormal movements: None, normal Patient's awareness of abnormal movements (rate only patient's report): No Awareness, Dental Status Current problems with teeth and/or dentures?: No Does patient usually wear dentures?: No  CIWA:    COWS:     Treatment Plan Summary: Daily contact with patient to assess and evaluate symptoms and progress in treatment Medication management. Continue olanzapine prn and latuda medications for psychosis. Will  Consider ativan prn for agitation.  Plan:1. Admit for crisis management and stabilization. 2. Medication management to reduce current symptoms to base line and improve the     patient's overall level of functioning 3. Treat health problems as indicated. 4. Develop treatment plan to decrease risk of relapse upon discharge and the need for     readmission. 5. Psycho-social education regarding relapse prevention and self care. 6. Health care follow up as needed for medical problems. 7. Restart home medications where appropriate. 8. Continue current medication regimen. May consider increase celexa by tomorrow if still withdrawn.   Medical Decision Making Problem Points:  Established problem, worsening (2), Review of last therapy session (1) and Review of psycho-social  stressors (1) Data Points:  Order Aims Assessment (2) Review of medication regiment & side effects (2) Review of new medications or change in dosage (2)  I certify that inpatient services furnished can reasonably be expected to improve the patient's condition.   Thresa Ross, MD 12/26/2012, 10:20 AM

## 2012-12-26 NOTE — Progress Notes (Signed)
Patient ID: Veronica Perez, female   DOB: 19-Feb-1975, 37 y.o.   MRN: [id number] 1:1 Nursing Note: The patient is more talkative and interacting appropriately this evening. Her eye contact is better and her thoughts are relevant and logical. She is still complaining of neck and knee pain resulting from sleeping on the hospital mattress and thin pillow. Continues to have bladder discomfort and feels the need to frequently urinate. Encouraged to increase fluids. She came out of her room this evening to get a snack and to take her medications at the medication window. She did not attend evening group. Still experiencing some paranoia around others. 1:1 maintained for safety. Will continue to monitor.

## 2012-12-26 NOTE — Progress Notes (Signed)
Patient ID: Veronica Perez, female   DOB: 1975/07/14, 37 y.o.   MRN: [id number] 1:1 Nursing Note: The patient is resting on her mattress that she placed on the floor. Her sleep is very restless and she awakens frequently. States that she is having bad dreams. Responds to reassurance. 1:1 maintained for safety. Will continue to monitor.

## 2012-12-26 NOTE — Progress Notes (Signed)
Patient ID: Veronica Perez, female   DOB: 1975/02/27, 37 y.o.   MRN: [id number] 1:1 Nursing Note: The patient is resting in bed with eyes closed. No distress noted. 1:1 maintained for safety. Will continue to monitor.

## 2012-12-26 NOTE — Progress Notes (Signed)
Adult Psychoeducational Group Note  Date:  12/26/2012 Time:  8:00 pm  Group Topic/Focus:  Wrap-Up Group:   The focus of this group is to help patients review their daily goal of treatment and discuss progress on daily workbooks.  Participation Level:  Did Not Attend  Modena Nunnery 12/26/2012, 10:30 PM

## 2012-12-26 NOTE — Progress Notes (Signed)
Patient ID: Veronica Perez, female   DOB: 05-Apr-1975, 37 y.o.   MRN: [id number] Psychoeducational Group Note  Date:  12/26/2012 Time:  0930am  Group Topic/Focus:  Making Healthy Choices:   The focus of this group is to help patients identify negative/unhealthy choices they were using prior to admission and identify positive/healthier coping strategies to replace them upon discharge.  Participation Level:  Did Not Attend  Participation Quality:    Affect: Cognitive:  Insight: Engagement in Group:  Additional Comments:  Inventory and Psychoeducational group   Valente David 12/26/2012,10:21 AM

## 2012-12-26 NOTE — [facility] Group Notes (Signed)
[facility] Group Notes: (Clinical Social Work)   12/26/2012      Type of Therapy:  Group Therapy   Participation Level:  Did Not Attend    Tracen Mahler Grossman-Orr, LCSW 12/26/2012, 12:37 PM     

## 2012-12-26 NOTE — Progress Notes (Signed)
Patient ID: CAITLYNN JU, female   DOB: 05-Mar-1975, 37 y.o.   MRN: [id number] 12-26-12 @ 1800 nursing 1:1 note: D: Patient remains acutely psychotic, in am patient had toilet paper stuffed into ears and responding to internal stimuli. She remains with disorganized thought process, and requires frequent redirection. Pt states '' I know that they were trying to poison me. The food has been tampered with. '' She remains moderately confused. A. Support and encouragement provided. Discussed above information with MD. Prn and scheduled medications given. R. Currently patient affect brighter, hygiene improved. Pt allowed to go to dining hall this evening to obtain meal with 1.1 staff to rtn to her room to eat due to anxiety . Pt remains 1/1 for safety. Pt is safe, Will continue to monitor Q 15 minutes for safety.

## 2012-12-26 NOTE — Progress Notes (Signed)
Patient ID: Veronica Perez, female   DOB: 02/22/1975, 37 y.o.   MRN: [id number] 12-26-2012 @ 1400 nursing 1:1 note: D: pt has slept from approximately 1200 noon till 1430 and didn't not eat her lunch. She has remained in her room and did go to the BR x1. A: after she woke up she wanted something to eat, so staff heated up her lunch and gave it to her in her room. R: on her inventory sheet she wrote: slept poor, appetite improving, energy hyper and attention improving. She did not address her depression and hopelessness. W/d symptoms have been agitation and cravings, but she has a negative uds. She states she is having suicidal thoughts, but is able to contract for safety. Physical problems have been blurred vision and pain. The 1:1 continues.

## 2012-12-26 NOTE — Progress Notes (Signed)
Patient ID: Veronica Perez, female   DOB: September 18, 1975, 37 y.o.   MRN: [id number] 12-26-12 @1000  nursing 1:1 note: D: this patient remains confused and stated she is actively suicidal. She continues to have auditory hallucinations and is wearing ear plugs to "drown them out".  She ate 100 % of her breakfast, is taking in fluids (750 cc) this am, urinated x3 and wouldn't take to the MD. A: Staff continues to encourage and support her. An order was obtained for nutritional supplements due to erratic eating. She has a very poor memory and the mht assigned to her helps her with her recall. R: she is receptive to the RN and the 1:1 order was renewed and remains. At times she can have poor boundaries and she was able to contract for safety verbally. She will also go into the BR and stay with her ears covered due to the auditory hallucinations.

## 2012-12-27 MED ORDER — CITALOPRAM HYDROBROMIDE 20 MG PO TABS
20.0000 mg | ORAL_TABLET | Freq: Every day | ORAL | Status: DC
  Administered 2012-12-28: 20 mg via ORAL
  Filled 2012-12-27 (×2): qty 1

## 2012-12-27 NOTE — Progress Notes (Signed)
Patient ID: Veronica Perez, female   DOB: 1975-02-29, 37 y.o.   MRN: [id number] Patient remains isolative to room.  Patient did come out of room at 1700 to take her medications.  She refuses to go to group and participate.  She is currently visiting with her mother in her room.  Patient remains guarded and suspicious.  Continue 1:1 observation for redirection as needed.

## 2012-12-27 NOTE — Progress Notes (Signed)
Patient ID: Veronica Perez, female   DOB: [birth year], 37 y.o.   MRN: [id number] 1:1 Nursing Note: The patient did not sleep well at all last night. She would dose for a few minutes and get up to go to the bathroom. Stated the mattress was causing her pain. Pleaded to be discharge today because she could not bare another sleepless night. 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 She is afraid her daughter will get behind in her studies if she is not there to teach her. Attended to her ADL's and put on make-up and combed her hair. 1:1 maintained for safety. Will continue to monitor.

## 2012-12-27 NOTE — Progress Notes (Signed)
Patient ID: Veronica Perez, female   DOB: 1975-11-14, 37 y.o.   MRN: [id number] 1:1 Nursing Note: The patient is still awake in her room. She is very anxious and tearful. Stated she wants to go home because she can't sleep on the mattress. She has her menses and is up frequently to the bathroom. C/o menstrual cramps and was provided with heat packs and a cup of tea. Medicated for anxiety and menstrual cramps. Verbal support provided. Mattress moved to the floor as per her request. Encouraged to try to sleep. 1:1 maintained for safety. Will continue to monitor.

## 2012-12-27 NOTE — Progress Notes (Signed)
Nutrition Brief Note  Patient identified on the Malnutrition Screening Tool (MST) Report.  Wt Readings from Last 10 Encounters:  12/21/12 175 lb (79.379 kg)  10/29/12 181 lb (82.101 kg)  07/26/12 194 lb (87.998 kg)  06/25/12 191 lb (86.637 kg)  06/04/12 192 lb 11.2 oz (87.408 kg)  03/01/10 216 lb (97.977 kg)  01/08/10 215 lb (97.523 kg)  12/17/09 226 lb (102.513 kg)  05/31/09 212 lb 14.4 oz (96.571 kg)  10/17/08 230 lb (104.327 kg)   Body mass index is 30.02 kg/(m^2). Patient meets criteria for class I obesity based on current BMI.   Discussed intake PTA with patient and compared to intake presently.  Discussed changes in intake, if any, and encouraged adequate intake of meals and snacks. Met with pt who reports her appetite was down for 1 month. Unsure of any changes in weight. Noted earlier in admission pt thought food was poisoned however RN reports pt eating well today. Ate 100% of her lunch and is interested in getting Ensure.    Current diet order is regular and pt is also offered choice of unit snacks mid-morning and mid-afternoon.  Pt is eating as desired.   Labs and medications reviewed. AST elevated.   Nutrition Dx:  Unintended wt change r/t suboptimal oral intake/poor appetite AEB pt report  Interventions:   Discussed the importance of nutrition and encouraged intake of food and beverages.     Supplements: continue Ensure Complete BID  No additional nutrition interventions warranted at this time. If nutrition issues arise, please consult RD.   Levon Hedger MS, RD, LDN [phone] Pager [phone] After Hours Pager

## 2012-12-27 NOTE — Progress Notes (Signed)
Recreation Therapy Notes  Date: 11.24.2014 Time: 9:30AM Location: [address] Dayroom   Group Topic: Wellness  Goal Area(s) Addresses:  Patient will define components of whole wellness. Patient will verbalize benefit of whole wellness.  Behavioral Response: Did not attend.   Marykay Lex Warda Mcqueary, LRT/CTRS  Garrick Midgley L 12/27/2012 1:32 PM

## 2012-12-27 NOTE — [facility] Group Notes (Signed)
[facility] LCSW Aftercare Discharge Planning Group Note   12/27/2012 10:48 AM  Participation Quality:  Did not attend    [place]

## 2012-12-27 NOTE — Progress Notes (Signed)
Patient ID: Veronica Perez, female   DOB: 09-09-1975, 37 y.o.   MRN: [id number] D: Patient continues to be isolative to room.  She refuses to go to group stating, "my clothes are not appropriate.  My mom brought the wrong things."  Patient refuses to go to the cafeteria for meals.  She is compliant with her medications.  She remains on 1:1 observation for redirection. A: continue to monitor patient's behavior and redirect as necessary.  R: patient is cooperative.

## 2012-12-27 NOTE — Progress Notes (Signed)
Patient ID: Veronica Perez, female   DOB: 1975-07-15, 37 y.o.   MRN: [id number]  [facility] MD Progress Note  12/27/2012 11:53 AM Veronica Perez  MRN:  [id number] Subjective:  Patient would not respond to writer's questions after multiple attempts were made to interact with her in patient's room.   Objective:  Patient observed lying in bed with the covers over her head late in the morning. She did not respond to questions but after a while sat up in bed. Patient did not make any eye contact with Clinical research associate. 1:1 staff member reports that patient had been talking clearly in the morning with her. Over the weekend patient was placed on 1:1 due to bizarre behavior. Also expressed a great deal of paranoia accusing staff of poisoning her food. She continues to sleep poorly and to isolate in her room. Appears very withdrawn and depressed.   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   HENT: Negative.   Eyes: Negative.   Respiratory: Negative.   Cardiovascular: Negative.   Gastrointestinal: Negative.   Genitourinary: Negative.   Musculoskeletal: Negative.   Skin: Negative.   Neurological: Negative.   Endo/Heme/Allergies: Negative.   Psychiatric/Behavioral: Positive for depression and hallucinations. The patient is nervous/anxious and has insomnia.     Blood pressure 112/74, pulse 69,  temperature 97.8 F (36.6 C), temperature source Oral, resp. rate 16, height 5\' 4"  (1.626 m), weight 79.379 kg (175 lb).Body mass index is 30.02 kg/(m^2).  General Appearance: Fairly Groomed  Patent attorney::  None  Speech:  Clear and Coherent-per report of staff  Volume:  Unable to assess today   Mood:  Anxious and Irritable  Affect:  Labile  Thought Process:  Disorganized  Orientation:  Full (Time, Place, and Person)  Thought Content:  Delusions, Paranoia  Suicidal Thoughts:  No  Homicidal Thoughts:  No  Memory:  Immediate;   Fair Recent;   Fair Remote;   Fair  Judgement:  Poor  Insight:  Lacking  Psychomotor Activity:  Decreased  Concentration:  Poor  Recall:  Poor  Akathisia:  No  Handed:  Right  AIMS (if indicated):     Assets:  Financial Resources/Insurance Physical Health Social Support  Sleep:  Number of Hours: 2   Current Medications: Current Facility-Administered Medications  Medication Dose Route Frequency Provider Last Rate Last Dose  . acetaminophen (TYLENOL) tablet 650 mg  650 mg Oral Q6H PRN Shuvon Rankin, NP   650 mg at 12/27/12 0056  . alum & mag hydroxide-simeth (MAALOX/MYLANTA) 200-200-20 MG/5ML suspension 30 mL  30 mL Oral Q4H PRN Shuvon Rankin, NP      . carbamazepine (TEGRETOL XR) 12 hr tablet 200 mg  200 mg Oral BID Mojeed Akintayo   200 mg at 12/27/12 0746  . ciprofloxacin (CIPRO) tablet 500 mg  500 mg Oral BID Shuvon Rankin, NP   500 mg at 12/27/12 0745  . citalopram (CELEXA)  tablet 10 mg  10 mg Oral Daily Mojeed Akintayo   10 mg at 12/27/12 0746  . feeding supplement (ENSURE COMPLETE) (ENSURE COMPLETE) liquid 237 mL  237 mL Oral BID BM Thresa Ross, MD   237 mL at 12/26/12 1603  . ibuprofen (ADVIL,MOTRIN) tablet 600 mg  600 mg Oral Q6H PRN Kerry Hough, PA-C   600 mg at 12/26/12 2050  . LORazepam (ATIVAN) tablet 1 mg  1 mg Oral Q8H PRN Thresa Ross, MD   1 mg at 12/27/12 0056  . lurasidone (LATUDA) tablet 40 mg  40 mg Oral QHS Mojeed Akintayo   40  mg at 12/26/12 2052  . magnesium hydroxide (MILK OF MAGNESIA) suspension 30 mL  30 mL Oral Daily PRN Shuvon Rankin, NP      . nicotine (NICODERM CQ - dosed in mg/24 hours) patch 21 mg  21 mg Transdermal Daily Mojeed Akintayo   21 mg at 12/25/12 0826  . OLANZapine zydis (ZYPREXA) disintegrating tablet 5 mg  5 mg Oral Q8H PRN Fransisca Kaufmann, NP   5 mg at 12/26/12 0757  . traZODone (DESYREL) tablet 50 mg  50 mg Oral QHS PRN Kristeen Mans, NP   50 mg at 12/26/12 2052    Lab Results:  No results found for this or any previous visit (from the past 48 hour(s)).  Physical Findings: AIMS: Facial and Oral Movements Muscles of Facial Expression: None, normal Lips and Perioral Area: None, normal Jaw: None, normal Tongue: None, normal,Extremity Movements Upper (arms, wrists, hands, fingers): None, normal Lower (legs, knees, ankles, toes): None, normal, Trunk Movements Neck, shoulders, hips: None, normal, Overall Severity Severity of abnormal movements (highest score from questions above): None, normal Incapacitation due to abnormal movements: None, normal Patient's awareness of abnormal movements (rate only patient's report): No Awareness, Dental Status Current problems with teeth and/or dentures?: No Does patient usually wear dentures?: No  CIWA:    COWS:     Treatment Plan Summary: Daily contact with patient to assess and evaluate symptoms and progress in treatment Medication management.  Plan:1. Continue crisis management and stabilization. 2. Medication management to reduce current symptoms to base line and improve the patient's overall level of functioning. Increase Celexa to 20 mg for ongoing depressive symptoms.  3. Treat health problems as indicated. 4. Develop treatment plan to decrease risk of relapse upon discharge and the need for readmission. 5. Psycho-social education regarding relapse prevention and self care. 6. Health care follow up as needed for medical problems. 7. Continue  1:1 due to bizarre and disorganized behaviors.   Medical Decision Making Problem Points:  Established problem, worsening (2), Review of last therapy session (1) and Review of psycho-social stressors (1) Data Points:  Order Aims Assessment (2) Review of medication regiment & side effects (2) Review of new medications or change in dosage (2)  I certify that inpatient services furnished can reasonably be expected to improve the patient's condition.   Fransisca Kaufmann, NP-C 12/27/2012, 11:53 AM

## 2012-12-27 NOTE — [facility] Group Notes (Signed)
[facility] LCSW Group Therapy  12/27/2012 1:15 pm  Type of Therapy: Process Group Therapy  Participation Level:  Did not attend    Summary of Progress/Problems: Today's group addressed the issue of overcoming obstacles.  Patients were asked to identify their biggest obstacle post d/c that stands in the way of their on-going success, and then problem solve as to how to manage this.  Daryel Gerald B 12/27/2012   4:15 PM

## 2012-12-27 NOTE — Progress Notes (Signed)
Seen and agreed. Abdulloh Ullom, MD 

## 2012-12-27 NOTE — Progress Notes (Signed)
Patient ID: Veronica Perez, female   DOB: 17-Jan-1976, 37 y.o.   MRN: [id number] D: Patient met with nurse this morning and stated, "I want to be discharged today.  I have to go home to my daughter.  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  My mother is going to be here at 1:00 and I'm leaving."  "I can go to my other doctor to get my medications."  Patient informed that she would probably not be discharged today as she was put on 1:1 observation over the weekend.  Patient stated that she could not attend groups because she did not have the right clothes.  She stated her mother brought her "all black shirts and black sweat pants."  She also stated, "my dad needs an assessment.  All he does is watch TV and tell me to take my meds."  Patient has her mattress on the floor, as she states she cannot sleep on the bed due to back pain.  Patient denies any SI/HI/AVH. A: Patient was encouraged to go to group and participate.  Educated on the importance of compliance with her medications. Continue to monitor medication management and MD orders.  R: Patient will remain on 1:1 observation due to constant redirection over the weekend. Monitor visits with mom due patient tends to escalate when mother leave.

## 2012-12-27 NOTE — Tx Team (Signed)
  Interdisciplinary Treatment Plan Update   Date Reviewed:  12/27/2012  Time Reviewed:  11:38 AM  Progress in Treatment:   Attending groups: No Participating in groups: No Taking medication as prescribed: Yes  Tolerating medication: Yes Family/Significant other contact made: Yes  Patient understands diagnosis: Yes  Discussing patient identified problems/goals with staff: Yes Medical problems stabilized or resolved: Yes Denies suicidal/homicidal ideation: No  But contracts for safety Patient has not harmed self or others: Yes  For review of initial/current patient goals, please see plan of care.  Estimated Length of Stay:  4-5 days  Reason for Continuation of Hospitalization: Depression Hallucinations Suicidal ideation Other; describe Agitation  New Problems/Goals identified:  N/A  Discharge Plan or Barriers:   return home, follow up outpt  Additional Comments:  Isolating in room, not attending groups.  Placed on 1:1 over the weekend due to increased agitation, on-going SI.  Grabbed mother in an attempt to keep her here during visit.  Attendees:  Signature: Thedore Mins, MD 12/27/2012 11:38 AM   Signature: Richelle Ito, LCSW 12/27/2012 11:38 AM  Signature: Fransisca Kaufmann, NP 12/27/2012 11:38 AM  Signature: Joslyn Devon, RN 12/27/2012 11:38 AM  Signature: Liborio Nixon, RN 12/27/2012 11:38 AM  Signature:  12/27/2012 11:38 AM  Signature:   12/27/2012 11:38 AM  Signature:    Signature:    Signature:    Signature:    Signature:    Signature:      Scribe for Treatment Team:   Richelle Ito, LCSW  12/27/2012 11:38 AM

## 2012-12-28 DIAGNOSIS — F3164 Bipolar disorder, current episode mixed, severe, with psychotic features: Secondary | ICD-10-CM

## 2012-12-28 MED ORDER — LURASIDONE HCL 40 MG PO TABS
40.0000 mg | ORAL_TABLET | Freq: Every day | ORAL | Status: DC
  Administered 2012-12-28 – 2012-12-29 (×2): 40 mg via ORAL
  Filled 2012-12-28: qty 1
  Filled 2012-12-28: qty 3
  Filled 2012-12-28 (×2): qty 1
  Filled 2012-12-28: qty 3

## 2012-12-28 MED ORDER — CITALOPRAM HYDROBROMIDE 20 MG PO TABS
20.0000 mg | ORAL_TABLET | Freq: Every day | ORAL | Status: DC
  Administered 2012-12-28 – 2012-12-29 (×2): 20 mg via ORAL
  Filled 2012-12-28 (×3): qty 1
  Filled 2012-12-28 (×2): qty 3

## 2012-12-28 NOTE — Progress Notes (Signed)
Patient ID: Veronica Perez, female   DOB: Jun 23, 1975, 37 y.o.   MRN: [id number] D: Patient remains isolative to room.  She got up for morning medications and returned to room.  She promptly went to sleep.  Patient informed staff that she is sleeping poorly at night.  MD is making adjustments to her medications.  Patient is sleeping on/off during the day and not sleeping at night.  She appears lethargic and slow.  Her mood is sad and depressed.  She denies any SI/HI/AVH.  A: continue to monitor medication management and MD orders.  Monitor patient's behavior and redirect as necessary.  R: patient to remain 1:1 due redirection and psychosis.

## 2012-12-28 NOTE — Progress Notes (Signed)
Seen and agreed. Faatima Tench, MD 

## 2012-12-28 NOTE — Progress Notes (Signed)
Pt remains at risk for psychotic behaviors and is on a 1:1 for redirection and safety   She remains safe at present

## 2012-12-28 NOTE — [facility] Group Notes (Signed)
[facility] LCSW Group Therapy  12/28/2012 , 1:27 PM   Type of Therapy:  Group Therapy  Participation Level:  Did not attend    Summary of Progress/Problems: Today's group focused on the term Diagnosis.  Participants were asked to define the term, and then pronounce whether it is a negative, positive or neutral term.  Daryel Gerald B 12/28/2012 , 1:27 PM

## 2012-12-28 NOTE — Progress Notes (Signed)
Patient ID: Veronica Perez, female   DOB: 1975-05-10, 37 y.o.   MRN: [id number] D: patient has been sleeping on and off today.  Patient asked MHT to call nurse into room.  Patient inquired as to when she will be discharged.  She was informed that it was the doctor's decision.  Informed patient that MD had been by in the morning to speak with her, but she wouldn't respond.  Patient states she does not remember talking with MD or seeing him in the room.  Informed patient that she needs to attend group, participate in her treatment, go to meals in the cafeteria and interact with others.  Patient asked for two makeup items out of her locker which were retrieved.  A: continue to monitor patient's behavior.  Remain 1:1 for redirection.

## 2012-12-28 NOTE — Progress Notes (Signed)
Patient ID: Veronica Perez, female   DOB: [birth year], 37 y.o.   MRN: [id number] Patient has been visible on the milieu.  She is in the day room playing cards with her other patients.  She has attended to her grooming.  She applied some makeup that was retrieved from her locker.  Patient went to eat in the cafeteria for dinner.  She has been pleasant and appropriate.

## 2012-12-28 NOTE — Progress Notes (Signed)
Veronica Perez, female   DOB: Aug 30, 1975, 37 y.o.   MRN: [id number]  [facility] MD Progress Note  12/28/2012 10:17 AM Veronica Perez  MRN:  [id number] Subjective: " I am still feeling tired and depressed."  Objective: Veronica behavior remains bizarre and her thought process is disorganized. Veronica remains delusional, guarded, withdrawn, isolating herself from others and showing minimal interaction with staffs and peers. She is partially compliant with her medications and often refused medications for fear of being poisoned. Veronica has refused to attend therapy groups and other unit activities. She stay awake most of the night pacing the hall way and sleeps all day in her room. Diagnosis:   DSM5: Schizophrenia Disorders:  Delusional Disorder (297.1) Obsessive-Compulsive Disorders:  anxiety Trauma-Stressor Disorders:  unkbown Substance/Addictive Disorders:   Depressive Disorders:  Disruptive Mood Dysregulation Disorder (296.99)  Axis I: Bipolar 1 disorder recent episode mixed with psychosis           Panic disorder without agoraphobia by history  ADL's:  Intact  Sleep: fair  Appetite:  Fair  Suicidal Ideation:  Plan:  denies  Intent:  denies Means:  denies Homicidal Ideation:  Plan:  denies Intent:  denies Means:  denies AEB (as evidenced by):  Psychiatric Specialty Exam: Review of Systems  Constitutional: Negative.   HENT: Negative.   Eyes: Negative.   Respiratory: Negative.   Cardiovascular: Negative.   Gastrointestinal: Negative.   Genitourinary: Negative.   Musculoskeletal: Negative.   Skin: Negative.   Neurological: Negative.   Endo/Heme/Allergies: Negative.   Psychiatric/Behavioral: Positive for depression and hallucinations. The Veronica is nervous/anxious and has insomnia.     Blood pressure 116/80, pulse 87, temperature 97.4 F (36.3 C), temperature source Oral, resp. rate 18, height 5\' 4"  (1.626 m), weight 79.379 kg (175 lb).Body mass index is 30.02  kg/(m^2).  General Appearance: Fairly Groomed  Patent attorney::  None  Speech:  Clear and Coherent-per report of staff  Volume:  Unable to assess today   Mood:  Anxious and Irritable  Affect:  Labile  Thought Process:  Disorganized  Orientation:  Full (Time, Place, and Person)  Thought Content:  Delusions, Paranoia  Suicidal Thoughts:  No  Homicidal Thoughts:  No  Memory:  Immediate;   Fair Recent;   Fair Remote;   Fair  Judgement:  Poor  Insight:  Lacking  Psychomotor Activity:  Decreased  Concentration:  Poor  Recall:  Poor  Akathisia:  No  Handed:  Right  AIMS (if indicated):     Assets:  Financial Resources/Insurance Physical Health Social Support  Sleep:  Number of Hours: 4.5   Current Medications: Current Facility-Administered Medications  Medication Dose Route Frequency Provider Last Rate Last Dose  . acetaminophen (TYLENOL) tablet 650 mg  650 mg Oral Q6H PRN Shuvon Rankin, NP   650 mg at 12/27/12 0056  . alum & mag hydroxide-simeth (MAALOX/MYLANTA) 200-200-20 MG/5ML suspension 30 mL  30 mL Oral Q4H PRN Shuvon Rankin, NP      . carbamazepine (TEGRETOL XR) 12 hr tablet 200 mg  200 mg Oral BID Stayce Delancy   200 mg at 12/28/12 0732  . ciprofloxacin (CIPRO) tablet 500 mg  500 mg Oral BID Shuvon Rankin, NP   500 mg at 12/28/12 0732  . citalopram (CELEXA) tablet 20 mg  20 mg Oral QPC supper Devika Dragovich      . feeding supplement (ENSURE COMPLETE) (ENSURE COMPLETE) liquid 237 mL  237 mL Oral BID BM Thresa Ross, MD   237  mL at 12/28/12 1006  . ibuprofen (ADVIL,MOTRIN) tablet 600 mg  600 mg Oral Q6H PRN Kerry Hough, PA-C   600 mg at 12/27/12 2143  . lurasidone (LATUDA) tablet 40 mg  40 mg Oral QPC supper Romari Gasparro      . magnesium hydroxide (MILK OF MAGNESIA) suspension 30 mL  30 mL Oral Daily PRN Shuvon Rankin, NP      . nicotine (NICODERM CQ - dosed in mg/24 hours) patch 21 mg  21 mg Transdermal Daily Susannah Carbin   21 mg at 12/25/12 0826  . OLANZapine  zydis (ZYPREXA) disintegrating tablet 5 mg  5 mg Oral Q8H PRN Fransisca Kaufmann, NP   5 mg at 12/28/12 0050  . traZODone (DESYREL) tablet 50 mg  50 mg Oral QHS PRN Colum Colt   50 mg at 12/27/12 2143    Lab Results:  No results found for this or any previous visit (from the past 48 hour(s)).  Physical Findings: AIMS: Facial and Oral Movements Muscles of Facial Expression: None, normal Lips and Perioral Area: None, normal Jaw: None, normal Tongue: None, normal,Extremity Movements Upper (arms, wrists, hands, fingers): None, normal Lower (legs, knees, ankles, toes): None, normal, Trunk Movements Neck, shoulders, hips: None, normal, Overall Severity Severity of abnormal movements (highest score from questions above): None, normal Incapacitation due to abnormal movements: None, normal Veronica's awareness of abnormal movements (rate only Veronica's report): No Awareness, Dental Status Current problems with teeth and/or dentures?: No Does Veronica usually wear dentures?: No  CIWA:    COWS:     Treatment Plan Summary: Daily contact with Veronica to assess and evaluate symptoms and progress in treatment Medication management.  Plan:1. Continue crisis management and stabilization. 2. Medication management to reduce current symptoms to base line and improve the Veronica's overall level of functioning.  Change  Celexa to 20 mg po after supper, Latuda 40mg  po daily after supper and continue  Tegretol XR to 200mg  po BID for mood lability.  3. Treat health problems as indicated. 4. Develop treatment plan to decrease risk of relapse upon discharge and the need for readmission. 5. Psycho-social education regarding relapse prevention and self care. 6. Health care follow up as needed for medical problems. 7. Continue 1:1 due to bizarre and disorganized behaviors.   Medical Decision Making Problem Points:  Established problem, slight improvement (1), Review of last therapy session (1) and Review of  psycho-social stressors (1) Data Points:  Order Aims Assessment (2) Review of medication regiment & side effects (2) Review of new medications or change in dosage (2)  I certify that inpatient services furnished can reasonably be expected to improve the Veronica's condition.   Thedore Mins, MD 12/28/2012, 10:17 AM

## 2012-12-28 NOTE — Progress Notes (Signed)
D   Pt tends to isolate to her room   She came to the medication window to get medications and was very polite and thankful  But she made poor eye contact and was very soft spoken and timid    She is guarded and remains disorganized with her thoughts    A   Pt is on a 1:1 for psychotic behaviors   Verbal support given  Medications administered and effectiveness monitored  Q 15 min checks R   Pt safe at present

## 2012-12-28 NOTE — Progress Notes (Signed)
Pt got up around 0050 and said she could not sleep   She received medication and is resting in bed with eyes closed  She remains on a 1:1 for behavioral and psychotic symptoms   She is presently safe

## 2012-12-28 NOTE — [facility] Group Notes (Signed)
Adult Psychoeducational Group Note  Date:  12/28/2012 Time:  8:52 PM  Group Topic/Focus:  Wrap-Up Group:   The focus of this group is to help patients review their daily goal of treatment and discuss progress on daily workbooks.  Participation Level:  Active  Participation Quality:  Appropriate  Affect:  Appropriate  Cognitive:  Appropriate  Insight: Appropriate  Engagement in Group:  Engaged  Modes of Intervention:  Discussion  Additional Comments:  Kenniyah stated her day was awesome.  She was able to go to the gym and play volleyball.  She also stated she ate a lot.   Caroll Rancher A 12/28/2012, 8:52 PM

## 2012-12-28 NOTE — Progress Notes (Signed)
D   Pt is more social and is going to groups   She said she wants to be discharged so she can see her daughter and that when she first came in she would not have been able to control her emotions and reaction to not being able to see her daughter  She said she feels more in control of her emotions now   She is pleasant and smiles A   Verbal support given   Medications administered and effectiveness monitored   Q 15 min checks   Pt on 1:1  R   Pt is safe at present

## 2012-12-29 MED ORDER — TRAZODONE HCL 50 MG PO TABS
50.0000 mg | ORAL_TABLET | Freq: Every evening | ORAL | Status: DC | PRN
  Filled 2012-12-29: qty 7

## 2012-12-29 MED ORDER — CITALOPRAM HYDROBROMIDE 20 MG PO TABS
20.0000 mg | ORAL_TABLET | Freq: Every day | ORAL | Status: DC
  Filled 2012-12-29: qty 7

## 2012-12-29 MED ORDER — LURASIDONE HCL 40 MG PO TABS
40.0000 mg | ORAL_TABLET | Freq: Every day | ORAL | Status: DC
  Filled 2012-12-29: qty 7

## 2012-12-29 NOTE — Progress Notes (Signed)
Pt has been asleep in her bed  No distress noted   She remains with 1:1 sitter for potential psychotic behaviors  Pt is presently safe

## 2012-12-29 NOTE — Progress Notes (Signed)
[facility] Post 1:1 Observation Documentation  For the first (8) hours following discontinuation of 1:1 precautions, a progress note entry by nursing staff should be documented at least every 2 hours, reflecting the patient's behavior, condition, mood, and conversation.  Use the progress notes for additional entries.  Time 1:1 discontinued:  1100  Patient's Behavior:  Patient's behavior has been appropriate.  She has been interacting well with her peers on the milieu.  She is pleasant to staff.  Patient will eat dinner in the cafeteria this evening.  Patient's Condition:  Patient's physical condition is stable.  Her mental status has improved.  She is less agitated.  Patient's Conversation: Patient is conversing well with staff and her peers on the unit.     Cranford Mon 12/29/2012, 5:03 PM

## 2012-12-29 NOTE — Progress Notes (Signed)
Seen and agreed. Alverna Fawley, MD 

## 2012-12-29 NOTE — Tx Team (Signed)
  Interdisciplinary Treatment Plan Update   Date Reviewed:  12/29/2012  Time Reviewed:  11:48 AM  Progress in Treatment:   Attending groups: Yes Participating in groups: Yes Taking medication as prescribed: Yes  Tolerating medication: Yes Family/Significant other contact made: Yes  Patient understands diagnosis: Yes  Discussing patient identified problems/goals with staff: Yes Medical problems stabilized or resolved: Yes Denies suicidal/homicidal ideation: Yes Patient has not harmed self or others: Yes  For review of initial/current patient goals, please see plan of care.  Estimated Length of Stay:  Likely d/c tomorrow  Reason for Continuation of Hospitalization:   New Problems/Goals identified:  N/A  Discharge Plan or Barriers:   return home, follow up outpt  Additional Comments:  Attendees:  Signature: Thedore Mins, MD 12/29/2012 11:48 AM   Signature: Richelle Ito, LCSW 12/29/2012 11:48 AM  Signature: Fransisca Kaufmann, NP 12/29/2012 11:48 AM  Signature: Joslyn Devon, RN 12/29/2012 11:48 AM  Signature: Liborio Nixon, RN 12/29/2012 11:48 AM  Signature:  12/29/2012 11:48 AM  Signature:   12/29/2012 11:48 AM  Signature:    Signature:    Signature:    Signature:    Signature:    Signature:      Scribe for Treatment Team:   Richelle Ito, LCSW  12/29/2012 11:48 AM

## 2012-12-29 NOTE — [facility] Group Notes (Signed)
[facility] LCSW Aftercare Discharge Planning Group Note   12/29/2012 9:33 AM  Participation Quality:  Minimal  Mood/Affect:  Irritable  Depression Rating:  5 "because I want to go home."  Anxiety Rating:  3 "because I want to go home"  Thoughts of Suicide:  No Will you contract for safety?   NA  Current AVH:  No  Plan for Discharge/Comments:  Initially refused to come to group, but also stated she wants to get out of here, so I advised her to attend all groups, and she came.  Grumpy, irritable.  Asked when she would be discharged.  Told her she needed to talk to Dr A.  "He's not my Dr.  He doesn't know me.  My Dr is Dr Betti Cruz!"  Was agreeable to sign release for [facility].  Transportation Means:  family  Supports: family  [place], Baldo Daub

## 2012-12-29 NOTE — Progress Notes (Signed)
[facility] Adult Case Management Discharge Plan :  Will you be returning to the same living situation after discharge: Yes,  home' At discharge, do you have transportation home?:Yes,  mother Do you have the ability to pay for your medications:Yes,  insurance  Release of information consent forms completed and in the chart;  Patient's signature needed at discharge.  Patient to Follow up at: Follow-up Information   Follow up with [facility] On 01/06/2013. (Thursday at 2:00 with Dr Betti Cruz)    Contact information:   [address] [place]  [336] [phone]      Patient denies SI/HI:   Yes,  yes    Safety Planning and Suicide Prevention discussed:  Yes,  yes  Ida Rogue 12/29/2012, 11:51 AM

## 2012-12-29 NOTE — Progress Notes (Signed)
Pt is calm and cooperative   She was allowed to go to the cafeteria with her 1:1 to see if she is able to handle the stimulation   She slept through the night and did not require medication in the middle of the night to help her go back to sleep   Pt on 1:1  And is safe presently

## 2012-12-29 NOTE — Progress Notes (Signed)
Patient ID: Veronica Perez, female   DOB: 01-16-1976, 37 y.o.   MRN: [id number]  [facility] MD Progress Note  12/29/2012 10:56 AM Veronica Perez  MRN:  [id number] Subjective: Patient states "I am doing fine. I don't know why the Doctor is calling my mother telling her I am sleeping all day. I don't like people playing games." Patient immediately became more pleasant when informed her discharge date is tomorrow and began to compliment writer's clipboard.   Objective:  Patient observed interacting with others on the unit. She is responding to questions today but reports not remembering the last two days when she refused to speak to Provider's stating "I must have been sleepy. My medications are better now." Patient is much improved but remains with labile and irritable mood. She is focused on going home and displays limited insight into her mental illness stating "I just need to take care of my daughter. I'm fine myself."   Diagnosis:   DSM5: Schizophrenia Disorders:  Delusional Disorder (297.1) Obsessive-Compulsive Disorders:  anxiety Trauma-Stressor Disorders:  unkbown Substance/Addictive Disorders:   Depressive Disorders:  Disruptive Mood Dysregulation Disorder (296.99)  Axis I: Bipolar 1 disorder recent episode mixed with psychosis           Panic disorder without agoraphobia by history  ADL's:  Intact  Sleep: Fair  Appetite:  Fair  Suicidal Ideation:  Plan:  denies  Intent:  denies Means:  denies Homicidal Ideation:  Plan:  denies Intent:  denies Means:  denies AEB (as evidenced by):  Psychiatric Specialty Exam: Review of Systems  Constitutional: Negative.   HENT: Negative.   Eyes: Negative.   Respiratory: Negative.   Cardiovascular: Negative.   Gastrointestinal: Negative.   Genitourinary: Negative.   Musculoskeletal: Negative.   Skin: Negative.   Neurological: Negative.   Endo/Heme/Allergies: Negative.   Psychiatric/Behavioral: Positive for depression and  hallucinations. The patient is nervous/anxious and has insomnia.     Blood pressure 131/79, pulse 80, temperature 98.2 F (36.8 C), temperature source Oral, resp. rate 20, height 5\' 4"  (1.626 m), weight 79.379 kg (175 lb).Body mass index is 30.02 kg/(m^2).  General Appearance: Well Groomed  Patent attorney::  None  Speech:  Clear and Coherent  Volume:  Normal  Mood:  Anxious and Irritable  Affect:  Labile  Thought Process:  Circumstantial  Orientation:  Full (Time, Place, and Person)  Thought Content:  Rumination  Suicidal Thoughts:  No  Homicidal Thoughts:  No  Memory:  Immediate;   Fair Recent;   Fair Remote;   Fair  Judgement:  Poor  Insight:  Lacking  Psychomotor Activity:  Increased  Concentration:  Poor  Recall:  Poor  Akathisia:  No  Handed:  Right  AIMS (if indicated):     Assets:  Financial Resources/Insurance Physical Health Social Support  Sleep:  Number of Hours: 4.5   Current Medications: Current Facility-Administered Medications  Medication Dose Route Frequency Provider Last Rate Last Dose  . acetaminophen (TYLENOL) tablet 650 mg  650 mg Oral Q6H PRN Shuvon Rankin, NP   650 mg at 12/27/12 0056  . alum & mag hydroxide-simeth (MAALOX/MYLANTA) 200-200-20 MG/5ML suspension 30 mL  30 mL Oral Q4H PRN Shuvon Rankin, NP      . carbamazepine (TEGRETOL XR) 12 hr tablet 200 mg  200 mg Oral BID Mojeed Akintayo   200 mg at 12/29/12 0735  . citalopram (CELEXA) tablet 20 mg  20 mg Oral QPC supper Mojeed Akintayo   20 mg at 12/28/12 1659  .  feeding supplement (ENSURE COMPLETE) (ENSURE COMPLETE) liquid 237 mL  237 mL Oral BID BM Thresa Ross, MD   237 mL at 12/28/12 1531  . ibuprofen (ADVIL,MOTRIN) tablet 600 mg  600 mg Oral Q6H PRN Kerry Hough, PA-C   600 mg at 12/27/12 2143  . lurasidone (LATUDA) tablet 40 mg  40 mg Oral QPC supper Mojeed Akintayo   40 mg at 12/28/12 1659  . magnesium hydroxide (MILK OF MAGNESIA) suspension 30 mL  30 mL Oral Daily PRN Shuvon Rankin, NP       . nicotine (NICODERM CQ - dosed in mg/24 hours) patch 21 mg  21 mg Transdermal Daily Mojeed Akintayo   21 mg at 12/25/12 0826  . OLANZapine zydis (ZYPREXA) disintegrating tablet 5 mg  5 mg Oral Q8H PRN Fransisca Kaufmann, NP   5 mg at 12/28/12 2111  . traZODone (DESYREL) tablet 50 mg  50 mg Oral QHS PRN Mojeed Akintayo   50 mg at 12/28/12 2111    Lab Results:  No results found for this or any previous visit (from the past 48 hour(s)).  Physical Findings: AIMS: Facial and Oral Movements Muscles of Facial Expression: None, normal Lips and Perioral Area: None, normal Jaw: None, normal Tongue: None, normal,Extremity Movements Upper (arms, wrists, hands, fingers): None, normal Lower (legs, knees, ankles, toes): None, normal, Trunk Movements Neck, shoulders, hips: None, normal, Overall Severity Severity of abnormal movements (highest score from questions above): None, normal Incapacitation due to abnormal movements: None, normal Patient's awareness of abnormal movements (rate only patient's report): No Awareness, Dental Status Current problems with teeth and/or dentures?: No Does patient usually wear dentures?: No  CIWA:    COWS:     Treatment Plan Summary: Daily contact with patient to assess and evaluate symptoms and progress in treatment Medication management.  Plan:1. Continue crisis management and stabilization. 2. Medication management to reduce current symptoms to base line and improve the patient's overall level of functioning. Continue Celexa to 20 mg po after supper, Latuda 40mg  po daily after supper and continue  Tegretol XR to 200mg  po BID for mood lability.  3. Tegretol level in am of 12/30/12.  4. Develop treatment plan to decrease risk of relapse upon discharge and the need for readmission. 5. Psycho-social education regarding relapse prevention and self care. 6. Health care follow up as needed for medical problems. 7. Discontinue 1:1. Anticipate d/c tomorrow.   Medical  Decision Making Problem Points:  Established problem, slight improvement (1), Review of last therapy session (1) and Review of psycho-social stressors (1) Data Points:  Review of medication regiment & side effects (2) Review of new medications or change in dosage (2)  I certify that inpatient services furnished can reasonably be expected to improve the patient's condition.   Fransisca Kaufmann, NP-C 12/29/2012, 10:56 AM

## 2012-12-29 NOTE — Progress Notes (Signed)
[facility] Post 1:1 Observation Documentation  For the first (8) hours following discontinuation of 1:1 precautions, a progress note entry by nursing staff should be documented at least every 2 hours, reflecting the patient's behavior, condition, mood, and conversation.  Use the progress notes for additional entries.  Time 1:1 discontinued:  1100   Patient's Behavior: Patient's behavior is appropriate.  She went to cafeteria for lunch and was observed interacting well with her peers.   Patient's Condition:  Patient's physical condition is stable.  Her mood is stable; affect brighter.  Patient's Conversation:  Patient requested review of medications.  Reviewed medications with patient and indications of same.    Cranford Mon 12/29/2012, 4:29 PM

## 2012-12-29 NOTE — Progress Notes (Signed)
[facility] Post 1:1 Observation Documentation For the first (8) hours following discontinuation of 1:1 precautions, a progress note entry by nursing staff should be documented at least every 2 hours, reflecting the patient's behavior, condition, mood, and conversation.  Use the progress notes for additional entries.  Time 1:1 discontinued:  1100    Patient's Behavior:  Patient's behavior is appropriate.  She is visible in the milieu.  She is attending groups and participating.  Patient's Condition: Patient's physical condition is stable.  Her mood is stable.   Patient's Conversation:  Patient asked about her discharge.  She states she is happy about going home and seeing her daughter tomorrow.  Cranford Mon 12/29/2012, 4:27 PM

## 2012-12-29 NOTE — Progress Notes (Addendum)
Patient ID: Veronica Perez, female   DOB: 1975-07-18, 37 y.o.   MRN: [id number] D: patient remains 1:1 due to lability and redirection.  Patient presents with irritable, angry mood this morning.  She is demanding to be released today.  When she was praised for her visibility on the unit yesterday, she replied, "yeah, I'm having to do things I don't wanna do!"  "Tomorrow is Thanksgiving. I need to be with my daughter. She also stated, "I have my own doctor that I go to.  This man don't know me!"  Patient demanded to speak with the doctor this morning.  Explained to patient that the doctor or NP would see her and she could express her concerns. NP spoke with patient and informed her that her discharge will be tomorrow.  A: encouraged patient to continue to be visible on the milieu and attend groups.  Monitor medication management and review indications of each to patient.  Safety checks completed every 15 minutes.  1:1 observation discontinued due patient's appropriate behavior.  R: patient receptive to staff.

## 2012-12-29 NOTE — [facility] Group Notes (Signed)
[facility] [employer] Group Therapy  12/29/2012 , 2:56 PM    Type of Therapy:  M[employer] Presentation  Participation Level:  Active  Participation Quality:  Attentive  Affect:  Blunted  Cognitive:  Oriented  Insight:  Limited  Engagement in Therapy:  Engaged  Modes of Intervention:  Discussion, Education and Socialization  Summary of Progress/Problems:  [name] from [employer] came to present his recovery story and play the guitar.  Veronica Perez willingly attended group.  Her mood was good throughout.  Laughing and interacting with others.  Daryel Gerald B 12/29/2012 , 2:56 PM

## 2012-12-29 NOTE — Progress Notes (Signed)
Adult Psychoeducational Group Note  Date:  12/29/2012 Time:  9:33 PM  Group Topic/Focus:  Wrap-Up Group:   The focus of this group is to help patients review their daily goal of treatment and discuss progress on daily workbooks.  Participation Level:  Minimal  Participation Quality:  Appropriate  Affect:  Appropriate  Cognitive:  Lacking  Insight: Limited  Engagement in Group:  Engaged  Modes of Intervention:  Support  Additional Comments:  Patient attended and participated in group tonight. She reports having a good day today. She socialized and talked with peers, she went down for her meals (which was good), she took her medications and attended her groups. She advised that she missed her daughter and looking forward to spending time with her.  Lita Mains [facility] 12/29/2012, 9:33 PM

## 2012-12-29 NOTE — Tx Team (Signed)
  Interdisciplinary Treatment Plan Update   Date Reviewed:  12/29/2012  Time Reviewed:  3:50 PM  Progress in Treatment:   Attending groups: Yes Participating in groups: Yes Taking medication as prescribed: Yes  Tolerating medication: Yes Family/Significant other contact made: Yes  Patient understands diagnosis: Yes  Discussing patient identified problems/goals with staff: Yes Medical problems stabilized or resolved: Yes Denies suicidal/homicidal ideation: Yes Patient has not harmed self or others: Yes  For review of initial/current patient goals, please see plan of care.  Estimated Length of Stay:  Likely d/c tomorrow  Reason for Continuation of Hospitalization:   New Problems/Goals identified:  N/A  Discharge Plan or Barriers:   return home, follow up outpt  Additional Comments:  Attendees:  Signature: Thedore Mins, MD 12/29/2012 3:50 PM   Signature: Richelle Ito, LCSW 12/29/2012 3:50 PM  Signature: Fransisca Kaufmann, NP 12/29/2012 3:50 PM  Signature: Joslyn Devon, RN 12/29/2012 3:50 PM  Signature: Liborio Nixon, RN 12/29/2012 3:50 PM  Signature:  12/29/2012 3:50 PM  Signature:   12/29/2012 3:50 PM  Signature:    Signature:    Signature:    Signature:    Signature:    Signature:      Scribe for Treatment Team:   Richelle Ito, LCSW  12/29/2012 3:50 PM

## 2012-12-29 NOTE — Progress Notes (Signed)
[facility] Post 1:1 Observation Documentation  For the first (8) hours following discontinuation of 1:1 precautions, a progress note entry by nursing staff should be documented at least every 2 hours, reflecting the patient's behavior, condition, mood, and conversation.  Use the progress notes for additional entries.  Time 1:1 discontinued: 1100    Patient's Behavior:  Patient's behavior has been appropriate.  She is observed in the day room interacting with her peers.  Her mother is visiting and they are discussing their plans tomorrow for Thanksgiving.  Patient's Condition:  Patient's physical condition is stable.  Her mood is cheerful.  Patient's Conversation:  Patient thanked the staff for all their assistance.  She is very happy about seeing her daughter tomorrow.  Cranford Mon 12/29/2012, 6:33 PM

## 2012-12-29 NOTE — Progress Notes (Signed)
Recreation Therapy Notes  Date: 11.26.2014 Time: 9:30pm Location: [address] Dayroom   Group Topic: Coping Skills  Goal Area(s) Addresses:  Patient will complete grateful mandala. Patient will identify benefit of identifying things he/she is grateful for.    Behavioral Response: Appropriate, Engaged.   Intervention: Mandala  Activity: I am Grateful Mandala. Patient was asked to identify things they are grateful for to fit in various categories such as Happiness, Laughter; Mind, Body, Spirit; Knowledge, Education  Education: Pharmacologist, Discharge Planning  Education Outcome: Needs additional education.    Clinical Observations/Feedback: Patient actively participated in group activity, coloring worksheet and identifying the one thing she is grateful for is her admission so she does not have to be around her family.    Veronica Perez Veronica Perez, LRT/CTRS  Aimar Borghi L 12/29/2012 1:02 PM

## 2012-12-29 NOTE — Progress Notes (Signed)
[facility] Post 1:1 Observation Documentation  For the first (8) hours following discontinuation of 1:1 precautions, a progress note entry by nursing staff should be documented at least every 2 hours, reflecting the patient's behavior, condition, mood, and conversation.  Use the progress notes for additional entries.  Time 1:1 discontinued:  1100            Patient's Behavior:  Patient's behavior is appropriate.  She was observed in the day room conversing with others.     Patient's Condition:  Patient's physical condition is stable; her mood has improved from this morning.    Patient's Conversation:  Patient is interacting well with staff and others.  Asked nurse about discharge tomorrow.  The process of discharge was reviewed with patient.  Cranford Mon 12/29/2012, 5:01 PM

## 2012-12-30 LAB — CARBAMAZEPINE LEVEL, TOTAL: Carbamazepine Lvl: 6.5 ug/mL (ref 4.0–12.0)

## 2012-12-30 MED ORDER — CITALOPRAM HYDROBROMIDE 20 MG PO TABS: 20.0000 mg | ORAL_TABLET | Freq: Every day | ORAL | Status: DC

## 2012-12-30 MED ORDER — ADULT MULTIVITAMIN W/MINERALS CH: 1.0000 | ORAL_TABLET | Freq: Every day | ORAL | Status: DC

## 2012-12-30 MED ORDER — CARBAMAZEPINE ER 200 MG PO TB12: 200.0000 mg | ORAL_TABLET | Freq: Two times a day (BID) | ORAL | Status: DC

## 2012-12-30 MED ORDER — LURASIDONE HCL 40 MG PO TABS: 40.0000 mg | ORAL_TABLET | Freq: Every day | ORAL | Status: DC

## 2012-12-30 MED ORDER — PENTOSAN POLYSULFATE SODIUM 100 MG PO CAPS: 200.0000 mg | ORAL_CAPSULE | Freq: Two times a day (BID) | ORAL | Status: DC

## 2012-12-30 MED ORDER — TRAZODONE HCL 50 MG PO TABS: 50.0000 mg | ORAL_TABLET | Freq: Every evening | ORAL | Status: DC | PRN

## 2012-12-30 NOTE — [facility] Suicide Risk Assessment (Signed)
Suicide Risk Assessment  Discharge Assessment     Demographic Factors:  Caucasian, Low socioeconomic status, Unemployed and female  Mental Status Per Nursing Assessment::   On Admission:  NA  Current Mental Status by Physician: patient denies suicidal ideation, intent or plan  Loss Factors: Financial problems/change in socioeconomic status  Historical Factors: patient denies suicidal ideation, intent or plan  Risk Reduction Factors:   Sense of responsibility to family, Living with another person, especially a relative and Positive social support  Continued Clinical Symptoms:  Resolving mood symptoms  Cognitive Features That Contribute To Risk:  Closed-mindedness Polarized thinking    Suicide Risk:  Minimal: No identifiable suicidal ideation.  Patients presenting with no risk factors but with morbid ruminations; may be classified as minimal risk based on the severity of the depressive symptoms  Discharge Diagnoses:   AXIS I:  Bipolar I disorder, most recent episode (or current) manic  AXIS II:  Deferred AXIS III:   Past Medical History  Diagnosis Date  . Depression   . Bipolar disorder   . Panic attacks    AXIS IV:  other psychosocial or environmental problems and problems with access to health care services AXIS V:  61-70 mild symptoms  Plan Of Care/Follow-up recommendations:  Activity:  as tolerated Diet:  healthy Tests:  Tegretol Level: 6.5 Other:  patient to keep her after care appointment  Is patient on multiple antipsychotic therapies at discharge:  No   Has Patient had three or more failed trials of antipsychotic monotherapy by history:  No  Recommended Plan for Multiple Antipsychotic Therapies: NA  Thedore Mins, MD 12/30/2012, 8:58 AM

## 2012-12-30 NOTE — Discharge Summary (Signed)
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   HENT: Negative.   Eyes: Negative.   Respiratory: Negative.   Cardiovascular: Negative.   Gastrointestinal: Negative.   Genitourinary: Negative.   Musculoskeletal: Negative.   Skin: Negative.   Neurological: Negative.   Endo/Heme/Allergies: Negative.   Psychiatric/Behavioral: Negative for depression, suicidal ideas, hallucinations, memory loss and substance abuse. The patient is nervous/anxious. The patient does not have insomnia.    DSM5:  AXIS I: Bipolar I disorder, most recent episode (or current) manic  AXIS II: Deferred  AXIS III:  Past Medical History   Diagnosis  Date   .  Depression    .  Bipolar disorder    .  Panic attacks     AXIS IV: other psychosocial or environmental problems and problems with access to health care services  AXIS V: 61-70 mild symptoms  Level of Care:  OP  Hospital Course:   Veronica Perez is a 37 year old female who presented to the [facility] acutely agitated and manic. The patient was made Involuntary due to bizarre and aggressive behaviors. Per the notes in EPIC patient demonstrated angry and defiant behaviors which resulted in her receiving IM medications. Her parents have noticed bizarre behaviors at home but the patient has no insight into her symptoms. Patient stated today "I had to get off klonopin because it was making me more manic. I don't remember what happened but my father brought me here. He just  tries to control me. I have so much energy, it's like that of an athlete. I smoke to deal with my anxiety." The patient is a poor historian and frequently derails during the conversation. She is more obsessed with showing this Clinical research associate a bruise on her elbow and then blankly stares ahead. When asked about past suicide attempts the patient states "I went to a concert one time and something happened." She was unable to provide further details. The patient's mood has been very labile since arriving on the unit. During meeting with MD this morning the patient refused to answer many of his questions stating "I have already told a Doctor that and I don't repeat myself." Patient has been off any psychiatric medications for an unknown period of time.          Veronica Perez was admitted to the adult unit where she was evaluated and her symptoms were identified. Medication management was discussed and implemented. Patient was started on Tegretol XR for improved mood stability, Celexa for depression and Latuda for psychosis. Patient was placed on 1:1 observation due to bizarre behaviors such as getting in the shower and not responding to staff requests to come out. There were two days in a row when the patient would not speak to Provider but then requested to go home through nursing staff. When asked about her behaviors the patient became defensive stating that "I don't like people playing games with me" and she appeared to be acting paranoid. She was encouraged to participate in unit programming. Medical problems  were identified and treated appropriately. Home medication was restarted as needed. Veronica Perez was evaluated each day by a clinical provider to ascertain the patient's response to treatment.  Improvement was noted by the patient's report of decreasing symptoms, improved sleep and appetite, affect, medication tolerance, behavior, and participation in unit programming.  Veronica Perez was asked each day to complete a self  inventory noting mood, mental status, pain, new symptoms, anxiety and concerns.         She responded well to medication and being in a therapeutic and supportive environment. Positive and appropriate behavior was noted and the patient was motivated for recovery.  Veronica Perez worked closely with the treatment team and case manager to develop a discharge plan with appropriate goals. Coping skills, problem solving as well as relaxation therapies were also part of the unit programming.         By the day of discharge Veronica Perez was in much improved condition than upon admission.  Symptoms were reported as significantly decreased or resolved completely.  The patient denied SI/HI and voiced no AVH. She was motivated to continue taking medication with a goal of continued improvement in mental health.          Veronica Perez was discharged home with a plan to follow up as noted below.  Consults:  None  Significant Diagnostic Studies:  labs: Admission labs completed   Discharge Vitals:   Blood pressure 131/79, pulse 80, temperature 98.2 F (36.8 C), temperature source Oral, resp. rate 20, height 5\' 4"  (1.626 m), weight 79.379 kg (175 lb). Body mass index is 30.02 kg/(m^2). Lab Results:   Results for orders placed during the hospital encounter of 12/21/12 (from the past 72 hour(s))  CARBAMAZEPINE LEVEL, TOTAL     Status: None   Collection Time    12/30/12  6:25 AM      Result Value Range   Carbamazepine Lvl 6.5  4.0 - 12.0 ug/mL   Comment: Performed at [facility]    Physical Findings: AIMS: Facial and Oral Movements Muscles of Facial Expression: None, normal Lips and Perioral Area: None, normal Jaw: None, normal Tongue: None, normal,Extremity Movements Upper (arms, wrists, hands, fingers): None, normal Lower (legs, knees, ankles, toes): None, normal, Trunk Movements Neck, shoulders, hips: None, normal, Overall Severity Severity of abnormal movements (highest score from  questions above): None, normal Incapacitation due to abnormal movements: None, normal Patient's awareness of abnormal movements (rate only patient's report): No Awareness, Dental Status Current problems with teeth and/or dentures?: No Does patient usually wear dentures?: No  CIWA:    COWS:     Psychiatric Specialty Exam: See Psychiatric Specialty Exam and Suicide Risk Assessment completed by Attending Physician prior to discharge.  Discharge destination:  Home  Is patient on multiple antipsychotic therapies at discharge:  No   Has Patient had three or more failed trials of antipsychotic monotherapy by history:  No  Recommended Plan for Multiple Antipsychotic Therapies: NA     Medication List    STOP taking these medications       clonazePAM 0.5 MG tablet  Commonly known as:  KLONOPIN     influenza vac split quadrivalent PF 0.5 ML injection  Commonly known as:  FLUARIX      TAKE these medications     Indication   acetaminophen 500 MG tablet  Commonly known as:  TYLENOL  Take 500 mg by mouth every 6 (six) hours as needed for moderate pain.  carbamazepine 200 MG 12 hr tablet  Commonly known as:  TEGRETOL XR  Take 1 tablet (200 mg total) by mouth 2 (two) times daily.   Indication:  Manic-Depression     citalopram 20 MG tablet  Commonly known as:  CELEXA  Take 1 tablet (20 mg total) by mouth daily after supper.   Indication:  Depression, Panic Disorder     cyclobenzaprine 10 MG tablet  Commonly known as:  FLEXERIL  Take 1 tablet (10 mg total) by mouth 3 (three) times daily as needed for muscle spasms.      ibuprofen 600 MG tablet  Commonly known as:  ADVIL,MOTRIN  Take 1 tablet (600 mg total) by mouth every 8 (eight) hours as needed for pain.      lurasidone 40 MG Tabs tablet  Commonly known as:  LATUDA  Take 1 tablet (40 mg total) by mouth daily after supper.   Indication:  Depressive Phase of Manic-Depression     multivitamin with minerals Tabs tablet   Take 1 tablet by mouth daily.   Indication:  Vitamin Supplementation     pentosan polysulfate 100 MG capsule  Commonly known as:  ELMIRON  Take 2 capsules (200 mg total) by mouth 2 (two) times daily.   Indication:  Bladder Wall Inflammation     traZODone 50 MG tablet  Commonly known as:  DESYREL  Take 1 tablet (50 mg total) by mouth at bedtime as needed for sleep.   Indication:  Trouble Sleeping           Follow-up Information   Follow up with [facility] On 01/06/2013. (Thursday at 2:00 with Dr Betti Cruz)    Contact information:   [address] [place]  [phone]      Follow-up recommendations:  Activity: as tolerated  Diet: healthy  Tests: Tegretol Level: 6.5  Other: patient to keep her after care appointment  Comments:   Take all your medications as prescribed by your mental healthcare provider.  Report any adverse effects and or reactions from your medicines to your outpatient provider promptly.  Patient is instructed and cautioned to not engage in alcohol and or illegal drug use while on prescription medicines.  In the event of worsening symptoms, patient is instructed to call the crisis hotline, 911 and or go to the nearest ED for appropriate evaluation and treatment of symptoms.  Follow-up with your primary care provider for your other medical issues, concerns and or health care needs.   Total Discharge Time:  Greater than 30 minutes.  SignedFransisca Kaufmann NP-C 12/30/2012, 9:18 AM

## 2012-12-30 NOTE — Progress Notes (Signed)
Patient ID: Veronica Perez, female   DOB: 1976-01-01, 37 y.o.   MRN: [id number] D. Resting in bed with eyes closed. No distress note. A. Q 15 minute checks maintained for safety. R. The patient is safe. Will continue to monitor.

## 2012-12-30 NOTE — Progress Notes (Signed)
Pt discharged per MD orders; pt currently denies SI/HI and auditory/visual hallucinations; pt was given education by RN regarding follow-up appointments and medications and pt denied any questions or concerns about these instructions; pt was then escorted to search room to retrieve her belongings by RN before being discharged to hospital lobby. 

## 2013-01-03 NOTE — Discharge Summary (Signed)
Seen and agreed. Denajah Farias, MD 

## 2013-01-04 NOTE — Progress Notes (Signed)
Patient Discharge Instructions:  After Visit Summary (AVS):   Faxed to:  01/04/13 Discharge Summary Note:   Faxed to:  01/04/13 Psychiatric Admission Assessment Note:   Faxed to:  01/04/13 Suicide Risk Assessment - Discharge Assessment:   Faxed to:  01/04/13 Faxed/Sent to the Next Level Care provider:  01/04/13 Faxed to [facility] @ [phone]  Jerelene Redden, 01/04/2013, 3:28 PM

## 2013-07-13 ENCOUNTER — Telehealth: Payer: Self-pay | Admitting: Family Medicine

## 2013-07-27 NOTE — Telephone Encounter (Signed)
Called to schedule IAWV. When pt returns call please schedule appt for 60 minutes.

## 2015-11-27 ENCOUNTER — Ambulatory Visit (HOSPITAL_COMMUNITY)
Admission: EM | Admit: 2015-11-27 | Discharge: 2015-11-27 | Disposition: A | Discharge status: Home / Self Care | Payer: Medicare Other | Attending: Emergency Medicine | Admitting: Emergency Medicine

## 2015-11-27 ENCOUNTER — Encounter (HOSPITAL_COMMUNITY): Payer: Self-pay | Admitting: Family Medicine

## 2015-11-27 DIAGNOSIS — L309 Dermatitis, unspecified: Secondary | ICD-10-CM

## 2015-11-27 MED ORDER — CLOBETASOL PROP EMOLLIENT BASE 0.05 % EX CREA: TOPICAL_CREAM | CUTANEOUS | 0 refills | Status: DC

## 2015-11-27 NOTE — Discharge Instructions (Signed)
Use the cream as directed. Follow with your doctor as needed

## 2015-11-27 NOTE — ED Provider Notes (Signed)
CSN: [id number]     Arrival date & time 11/27/15  1941 History   None    Chief Complaint  Patient presents with  . Rash   (Consider location/radiation/quality/duration/timing/severity/associated sxs/prior Treatment) 40 year old female states she has had dry scaly hands since August. She has a history of eczema or hand or arm otitis. This is a recurrence.      Past Medical History:  Diagnosis Date  . Bipolar disorder ([place])   . Depression   . Panic attacks    Past Surgical History:  Procedure Laterality Date  . ADENOIDECTOMY    . OVARIAN CYST SURGERY    . TONSILLECTOMY     History reviewed. No pertinent family history. Social History  Substance Use Topics  . Smoking status: Former Smoker    Quit date: 01/18/2012  . Smokeless tobacco: Never Used  . Alcohol use No   OB History    No data available     Review of Systems  Constitutional: Negative.   Respiratory: Negative.   Cardiovascular: Positive for palpitations. Negative for chest pain and leg swelling.  Gastrointestinal: Negative.   Musculoskeletal: Negative.   Skin:       As per history of present illness  Neurological: Negative.   All other systems reviewed and are negative.   Allergies  Penicillins  Home Medications   Prior to Admission medications   Medication Sig Start Date End Date Taking? Authorizing Provider  acetaminophen (TYLENOL) 500 MG tablet Take 500 mg by mouth every 6 (six) hours as needed for moderate pain.    Historical Provider, MD  carbamazepine (TEGRETOL XR) 200 MG 12 hr tablet Take 1 tablet (200 mg total) by mouth 2 (two) times daily. 12/30/12   [name], NP  citalopram (CELEXA) 20 MG tablet Take 1 tablet (20 mg total) by mouth daily after supper. 12/30/12   [name], NP  Clobetasol Prop Emollient Base 0.05 % emollient cream Apply to hands twice a day for hand rash 11/27/15   Janne Napoleon, NP  cyclobenzaprine (FLEXERIL) 10 MG tablet Take 1 tablet (10 mg total) by mouth 3  (three) times daily as needed for muscle spasms. 10/29/12   Coral Spikes, DO  ibuprofen (ADVIL,MOTRIN) 600 MG tablet Take 1 tablet (600 mg total) by mouth every 8 (eight) hours as needed for pain. 10/29/12   Coral Spikes, DO  lurasidone (LATUDA) 40 MG TABS tablet Take 1 tablet (40 mg total) by mouth daily after supper. 12/30/12   [name], NP  Multiple Vitamin (MULTIVITAMIN WITH MINERALS) TABS tablet Take 1 tablet by mouth daily. 12/30/12   [name], NP  pentosan polysulfate (ELMIRON) 100 MG capsule Take 2 capsules (200 mg total) by mouth 2 (two) times daily. 12/30/12   [name], NP  traZODone (DESYREL) 50 MG tablet Take 1 tablet (50 mg total) by mouth at bedtime as needed for sleep. 12/30/12   [name], NP   Meds Ordered and Administered this Visit  Medications - No data to display  BP 137/66 (BP Location: Left Arm)   Pulse 76   Temp 98.5 F (36.9 C) (Oral)   Resp 16   SpO2 98%  No data found.   Physical Exam  Constitutional: She appears well-developed and well-nourished. No distress.  HENT:  Head: Normocephalic and atraumatic.  Neck: Normal range of motion.  Cardiovascular: Normal rate, regular rhythm, normal heart sounds and intact distal pulses.   Pulmonary/Chest: Effort normal and breath sounds  normal.  Neurological: She is alert.  Skin: Skin is warm and dry.  Bilateral hand eczematoid rash, mildly erythemic, scaly rough. No signs of infection. Primarily to the palms.  Psychiatric: Her affect is labile. Her speech is rapid and/or pressured.  Nursing note and vitals reviewed.   Urgent Care Course   Clinical Course    Procedures (including critical care time)  Labs Review Labs Reviewed - No data to display  Imaging Review No results found.   Visual Acuity Review  Right Eye Distance:   Left Eye Distance:   Bilateral Distance:    Right Eye Near:   Left Eye Near:    Bilateral Near:         MDM   1. Hand dermatitis    Use the cream as  directed. Follow with your doctor as needed Meds ordered this encounter  Medications  . Clobetasol Prop Emollient Base 0.05 % emollient cream    Sig: Apply to hands twice a day for hand rash    Dispense:  30 g    Refill:  0    Order Specific Question:   Supervising Provider    Answer:   Billy Fischer [[id number]]       Janne Napoleon, NP 11/27/15 2050

## 2015-11-27 NOTE — ED Triage Notes (Signed)
Pt here for chronic rash to hands and body that flares up when she is stressed. sts she has an appointment with dermatologist in November. Pt speaking very fast and having rapid thoughts and flight of ideas

## 2016-05-27 ENCOUNTER — Other Ambulatory Visit (INDEPENDENT_AMBULATORY_CARE_PROVIDER_SITE_OTHER): Payer: Medicare Other

## 2016-05-27 ENCOUNTER — Ambulatory Visit (INDEPENDENT_AMBULATORY_CARE_PROVIDER_SITE_OTHER): Payer: Medicare Other | Admitting: Nurse Practitioner

## 2016-05-27 ENCOUNTER — Encounter: Payer: Self-pay | Admitting: Nurse Practitioner

## 2016-05-27 VITALS — BP 132/80 | HR 89 | Temp 98.1°F | Ht 64.0 in | Wt 194.0 lb

## 2016-05-27 DIAGNOSIS — R002 Palpitations: Secondary | ICD-10-CM

## 2016-05-27 DIAGNOSIS — R7989 Other specified abnormal findings of blood chemistry: Secondary | ICD-10-CM

## 2016-05-27 DIAGNOSIS — R946 Abnormal results of thyroid function studies: Secondary | ICD-10-CM

## 2016-05-27 LAB — CBC WITH DIFFERENTIAL/PLATELET
Basophils Absolute: 0.1 10*3/uL (ref 0.0–0.1)
Basophils Relative: 0.6 % (ref 0.0–3.0)
EOS ABS: 0.1 10*3/uL (ref 0.0–0.7)
Eosinophils Relative: 1 % (ref 0.0–5.0)
HCT: 40.7 % (ref 36.0–46.0)
Hemoglobin: 13.7 g/dL (ref 12.0–15.0)
LYMPHS ABS: 3 10*3/uL (ref 0.7–4.0)
Lymphocytes Relative: 32.4 % (ref 12.0–46.0)
MCHC: 33.7 g/dL (ref 30.0–36.0)
MCV: 77.4 fl — ABNORMAL LOW (ref 78.0–100.0)
MONO ABS: 0.6 10*3/uL (ref 0.1–1.0)
Monocytes Relative: 6.7 % (ref 3.0–12.0)
NEUTROS PCT: 59.3 % (ref 43.0–77.0)
Neutro Abs: 5.5 10*3/uL (ref 1.4–7.7)
Platelets: 327 10*3/uL (ref 150.0–400.0)
RBC: 5.26 Mil/uL — ABNORMAL HIGH (ref 3.87–5.11)
RDW: 13.2 % (ref 11.5–15.5)
WBC: 9.3 10*3/uL (ref 4.0–10.5)

## 2016-05-27 LAB — COMPREHENSIVE METABOLIC PANEL
ALBUMIN: 3.9 g/dL (ref 3.5–5.2)
ALT: 20 U/L (ref 0–35)
AST: 15 U/L (ref 0–37)
Alkaline Phosphatase: 60 U/L (ref 39–117)
BUN: 13 mg/dL (ref 6–23)
CO2: 23 meq/L (ref 19–32)
Calcium: 9.5 mg/dL (ref 8.4–10.5)
Chloride: 108 mEq/L (ref 96–112)
Creatinine, Ser: 0.51 mg/dL (ref 0.40–1.20)
GFR: 142.33 mL/min (ref >60.00)
GLUCOSE: 103 mg/dL — AB (ref 70–99)
POTASSIUM: 4.5 meq/L (ref 3.5–5.1)
SODIUM: 138 meq/L (ref 135–145)
Total Bilirubin: 0.3 mg/dL (ref 0.2–1.2)
Total Protein: 7.3 g/dL (ref 6.0–8.3)

## 2016-05-27 LAB — TSH
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
## 2016-05-27 NOTE — Patient Instructions (Signed)
Go to basement for blood draw. Sign medical release to get records from previous pcp. Will order holter monitor if labs are normal.  Palpitations A palpitation is the feeling that your heartbeat is irregular or is faster than normal. It may feel like your heart is fluttering or skipping a beat. Palpitations are usually not a serious problem. They may be caused by many things, including smoking, caffeine, alcohol, stress, and certain medicines. Although most causes of palpitations are not serious, palpitations can be a sign of a serious medical problem. In some cases, you may need further medical evaluation. Follow these instructions at home: Pay attention to any changes in your symptoms. Take these actions to help with your condition:  Avoid the following:  Caffeinated coffee, tea, soft drinks, diet pills, and energy drinks.  Chocolate.  Alcohol.  Do not use any tobacco products, such as cigarettes, chewing tobacco, and e-cigarettes. If you need help quitting, ask your health care provider.  Try to reduce your stress and anxiety. Things that can help you relax include:  Yoga.  Meditation.  Physical activity, such as swimming, jogging, or walking.  Biofeedback. This is a method that helps you learn to use your mind to control things in your body, such as your heartbeats.  Get plenty of rest and sleep.  Take over-the-counter and prescription medicines only as told by your health care provider.  Keep all follow-up visits as told by your health care provider. This is important. Contact a health care provider if:  You continue to have a fast or irregular heartbeat after 24 hours.  Your palpitations occur more often. Get help right away if:  You have chest pain or shortness of breath.  You have a severe headache.  You feel dizzy or you faint. This information is not intended to replace advice given to you by your health care provider. Make sure you discuss any questions you  have with your health care provider. Document Released: 01/18/2000 Document Revised: 06/25/2015 Document Reviewed: 10/05/2014 Elsevier Interactive Patient Education  2017 [employer].

## 2016-05-27 NOTE — Progress Notes (Signed)
Pre visit review using our clinic review tool, if applicable. No additional management support is needed unless otherwise documented below in the visit note. 

## 2016-05-30 ENCOUNTER — Telehealth: Payer: Self-pay | Admitting: Nurse Practitioner

## 2016-05-30 DIAGNOSIS — R7989 Other specified abnormal findings of blood chemistry: Secondary | ICD-10-CM | POA: Insufficient documentation

## 2016-05-30 DIAGNOSIS — R002 Palpitations: Secondary | ICD-10-CM | POA: Insufficient documentation

## 2016-05-30 HISTORY — DX: Other specified abnormal findings of blood chemistry: R79.89

## 2016-05-30 NOTE — Telephone Encounter (Signed)
-----   Message from Flossie Buffy, NP sent at 05/30/2016  9:11 AM EDT ----- Please have patient return to lab for repeat thyroid panel with TSH.  Thank you.  ----- Message ----- From: Shawnie Pons, LPN Sent: 05/16/8206  10:18 AM To: Flossie Buffy, NP  Hey I added on the thyroid profile for this pt as request, but the lab said they didn't draw her blood in the yellow tube therefore they can not do add on to this (pt has to come in and get her blood draw again).   Please advise.

## 2016-05-30 NOTE — Telephone Encounter (Signed)
Left vm asking to see if pt can come in and get the lab work done again due to add on thyroid profile. Waiting for pt to call back.

## 2016-06-02 NOTE — Telephone Encounter (Signed)
Pt verbalized to understand to come back to get lab work done.

## 2016-06-10 ENCOUNTER — Other Ambulatory Visit: Payer: Medicare Other

## 2016-06-10 DIAGNOSIS — R002 Palpitations: Secondary | ICD-10-CM

## 2016-06-10 DIAGNOSIS — E0590 Thyrotoxicosis, unspecified without thyrotoxic crisis or storm: Secondary | ICD-10-CM

## 2016-06-11 LAB — THYROID PANEL WITH TSH
Free Thyroxine Index: 5.9 — ABNORMAL HIGH (ref 1.4–3.8)
T3 Uptake: 37 % — ABNORMAL HIGH (ref 22–35)
T4, Total: 16 ug/dL — ABNORMAL HIGH (ref 4.5–12.0)
TSH: 0.01 mIU/L — ABNORMAL LOW

## 2016-06-12 MED ORDER — METHIMAZOLE 10 MG PO TABS: 10.0000 mg | ORAL_TABLET | Freq: Two times a day (BID) | ORAL | 0 refills | Status: DC

## 2016-07-15 ENCOUNTER — Ambulatory Visit: Payer: Medicare Other | Admitting: Nurse Practitioner

## 2016-07-25 ENCOUNTER — Ambulatory Visit (INDEPENDENT_AMBULATORY_CARE_PROVIDER_SITE_OTHER): Payer: Medicare Other | Admitting: Nurse Practitioner

## 2016-07-25 ENCOUNTER — Encounter: Payer: Self-pay | Admitting: Nurse Practitioner

## 2016-07-25 ENCOUNTER — Other Ambulatory Visit (INDEPENDENT_AMBULATORY_CARE_PROVIDER_SITE_OTHER): Payer: Medicare Other

## 2016-07-25 VITALS — BP 132/74 | HR 92 | Temp 98.0°F | Ht 64.0 in | Wt 191.0 lb

## 2016-07-25 DIAGNOSIS — E0590 Thyrotoxicosis, unspecified without thyrotoxic crisis or storm: Secondary | ICD-10-CM | POA: Diagnosis not present

## 2016-07-25 LAB — TSH

## 2016-07-25 LAB — T3, FREE: T3, Free: 9.5 pg/mL — ABNORMAL HIGH (ref 2.3–4.2)

## 2016-07-25 LAB — T4, FREE: Free T4: 2.22 ng/dL — ABNORMAL HIGH (ref 0.60–1.60)

## 2016-07-25 NOTE — Progress Notes (Signed)
   Subjective:  Patient ID: Veronica Perez, female    DOB: 1975-12-20  Age: 41 y.o. MRN: [id number]  CC: Follow-up (1 mo fu--diet changed/thyroid med consult--side effect/tdap?)   HPI  Hyperthyroidism: Did not take medication as prescribed. Persistent palpitation and anxiety. No weight loss.  Outpatient Medications Prior to Visit  Medication Sig Dispense Refill  . acetaminophen (TYLENOL) 500 MG tablet Take 500 mg by mouth every 6 (six) hours as needed for moderate pain.    . methimazole (TAPAZOLE) 10 MG tablet Take 1 tablet (10 mg total) by mouth 2 (two) times daily. (Patient not taking: Reported on 07/25/2016) 60 tablet 0   No facility-administered medications prior to visit.     ROS Review of Systems  Constitutional: Negative for diaphoresis, malaise/fatigue and weight loss.  Respiratory: Negative for shortness of breath.   Cardiovascular: Negative for chest pain, palpitations and leg swelling.  Musculoskeletal: Negative for falls, joint pain and myalgias.  Skin: Negative.   Neurological: Negative for dizziness, sensory change, loss of consciousness and weakness.  Psychiatric/Behavioral: Negative for depression and suicidal ideas. The patient is nervous/anxious. The patient does not have insomnia.      Objective:  BP 132/74   Pulse 92   Temp 98 F (36.7 C)   Ht 5\' 4"  (1.626 m)   Wt 191 lb (86.6 kg)   SpO2 99%   BMI 32.79 kg/m   BP Readings from Last 3 Encounters:  07/25/16 132/74  05/27/16 132/80  11/27/15 137/66    Wt Readings from Last 3 Encounters:  07/25/16 191 lb (86.6 kg)  05/27/16 194 lb (88 kg)  10/29/12 181 lb (82.1 kg)    Physical Exam  Constitutional: She is oriented to person, place, and time. No distress.  Neck: Normal range of motion. Neck supple. No thyromegaly present.  Cardiovascular: Normal rate, regular rhythm and normal heart sounds.   Pulmonary/Chest: Effort normal and breath sounds normal.  Lymphadenopathy:    She has no cervical  adenopathy.  Neurological: She is alert and oriented to person, place, and time.  Skin: Skin is warm and dry.  Psychiatric:  She is manic (rapid speech, changing topics, stating all her neighbors are harassing her).  Vitals reviewed.   Lab Results  Component Value Date   WBC 9.3 05/27/2016   HGB 13.7 05/27/2016   HCT 40.7 05/27/2016   PLT 327.0 05/27/2016   GLUCOSE 103 (H) 05/27/2016   ALT 20 05/27/2016   AST 15 05/27/2016   NA 138 05/27/2016   K 4.5 05/27/2016   CL 108 05/27/2016   CREATININE 0.51 05/27/2016   BUN 13 05/27/2016   CO2 23 05/27/2016   TSH <0.01 (L) 07/25/2016    No results found.  Assessment & Plan:   Veronica Perez was seen today for follow-up.  Diagnoses and all orders for this visit:  Hyperthyroidism -     US THYROID; Future -     Ambulatory referral to Endocrinology -     Thyroid Stimulating Immunoglobulin; Future -     TSH; Future -     T4, free; Future -     T3, free; Future   I am having Ms. Veronica Perez maintain her acetaminophen and methimazole.  No orders of the defined types were placed in this encounter.   Follow-up: Return in about 4 weeks (around 08/22/2016) for hyperthyroidism.  Wilfred Lacy, NP

## 2016-07-25 NOTE — Patient Instructions (Addendum)
Please take methimazole as prescribed. Go to basement for blood draw. You will be contacted to schedule appt with endocrinology and for thyroid US.  Hyperthyroidism Hyperthyroidism is when the thyroid is too active (overactive). Your thyroid is a large gland that is located in your neck. The thyroid helps to control how your body uses food (metabolism). When your thyroid is overactive, it produces too much of a hormone called thyroxine. What are the causes? Causes of hyperthyroidism may include:  Graves disease. This is when your immune system attacks the thyroid gland. This is the most common cause.  Inflammation of the thyroid gland.  Tumor in the thyroid gland or somewhere else.  Excessive use of thyroid medicines, including: ? Prescription thyroid supplement. ? Herbal supplements that mimic thyroid hormones.  Solid or fluid-filled lumps within your thyroid gland (thyroid nodules).  Excessive ingestion of iodine.  What increases the risk?  Being female.  Having a family history of thyroid conditions. What are the signs or symptoms? Signs and symptoms of hyperthyroidism may include:  Nervousness.  Inability to tolerate heat.  Unexplained weight loss.  Diarrhea.  Change in the texture of hair or skin.  Heart skipping beats or making extra beats.  Rapid heart rate.  Loss of menstruation.  Shaky hands.  Fatigue.  Restlessness.  Increased appetite.  Sleep problems.  Enlarged thyroid gland or nodules.  How is this diagnosed? Diagnosis of hyperthyroidism may include:  Medical history and physical exam.  Blood tests.  Ultrasound tests.  How is this treated? Treatment may include:  Medicines to control your thyroid.  Surgery to remove your thyroid.  Radiation therapy.  Follow these instructions at home:  Take medicines only as directed by your health care provider.  Do not use any tobacco products, including cigarettes, chewing tobacco, or  electronic cigarettes. If you need help quitting, ask your health care provider.  Do not exercise or do physical activity until your health care provider approves.  Keep all follow-up appointments as directed by your health care provider. This is important. Contact a health care provider if:  Your symptoms do not get better with treatment.  You have fever.  You are taking thyroid replacement medicine and you: ? Have depression. ? Feel mentally and physically slow. ? Have weight gain. Get help right away if:  You have decreased alertness or a change in your awareness.  You have abdominal pain.  You feel dizzy.  You have a rapid heartbeat.  You have an irregular heartbeat. This information is not intended to replace advice given to you by your health care provider. Make sure you discuss any questions you have with your health care provider. Document Released: 01/20/2005 Document Revised: 06/21/2015 Document Reviewed: 06/07/2013 Elsevier Interactive Patient Education  2017 [employer].

## 2016-07-28 LAB — THYROID STIMULATING IMMUNOGLOBULIN: TSI: 451 % baseline — ABNORMAL HIGH (ref <140)

## 2016-08-08 ENCOUNTER — Other Ambulatory Visit: Payer: Self-pay

## 2016-08-12 ENCOUNTER — Encounter (HOSPITAL_COMMUNITY): Payer: Self-pay

## 2016-08-12 ENCOUNTER — Emergency Department (HOSPITAL_COMMUNITY)
Admission: EM | Admit: 2016-08-12 | Discharge: 2016-08-14 | Disposition: A | Discharge status: Other Acute Inpatient Hospital | Payer: Medicare Other | Attending: Emergency Medicine | Admitting: Emergency Medicine

## 2016-08-12 DIAGNOSIS — F3110 Bipolar disorder, current episode manic without psychotic features, unspecified: Secondary | ICD-10-CM | POA: Diagnosis not present

## 2016-08-12 DIAGNOSIS — R1013 Epigastric pain: Secondary | ICD-10-CM | POA: Diagnosis not present

## 2016-08-12 DIAGNOSIS — F22 Delusional disorders: Secondary | ICD-10-CM | POA: Insufficient documentation

## 2016-08-12 DIAGNOSIS — H9203 Otalgia, bilateral: Secondary | ICD-10-CM | POA: Diagnosis not present

## 2016-08-12 DIAGNOSIS — G4700 Insomnia, unspecified: Secondary | ICD-10-CM | POA: Diagnosis not present

## 2016-08-12 DIAGNOSIS — R443 Hallucinations, unspecified: Secondary | ICD-10-CM | POA: Diagnosis not present

## 2016-08-12 DIAGNOSIS — Z79899 Other long term (current) drug therapy: Secondary | ICD-10-CM | POA: Diagnosis not present

## 2016-08-12 DIAGNOSIS — Z87891 Personal history of nicotine dependence: Secondary | ICD-10-CM | POA: Insufficient documentation

## 2016-08-12 DIAGNOSIS — E0590 Thyrotoxicosis, unspecified without thyrotoxic crisis or storm: Secondary | ICD-10-CM | POA: Insufficient documentation

## 2016-08-12 DIAGNOSIS — Z046 Encounter for general psychiatric examination, requested by authority: Secondary | ICD-10-CM | POA: Diagnosis not present

## 2016-08-12 DIAGNOSIS — F319 Bipolar disorder, unspecified: Secondary | ICD-10-CM | POA: Diagnosis present

## 2016-08-12 DIAGNOSIS — F312 Bipolar disorder, current episode manic severe with psychotic features: Secondary | ICD-10-CM | POA: Diagnosis present

## 2016-08-12 LAB — RAPID URINE DRUG SCREEN, HOSP PERFORMED
Amphetamines: NOT DETECTED
BARBITURATES: NOT DETECTED
Benzodiazepines: NOT DETECTED
COCAINE: NOT DETECTED
Opiates: NOT DETECTED
Tetrahydrocannabinol: NOT DETECTED

## 2016-08-12 LAB — CBC
HEMATOCRIT: 37.8 % (ref 36.0–46.0)
Hemoglobin: 13.3 g/dL (ref 12.0–15.0)
MCH: 26.9 pg (ref 26.0–34.0)
MCHC: 35.2 g/dL (ref 30.0–36.0)
MCV: 76.5 fL — ABNORMAL LOW (ref 78.0–100.0)
PLATELETS: 350 10*3/uL (ref 150–400)
RBC: 4.94 MIL/uL (ref 3.87–5.11)
RDW: 13 % (ref 11.5–15.5)
WBC: 13.4 10*3/uL — AB (ref 4.0–10.5)

## 2016-08-12 LAB — COMPREHENSIVE METABOLIC PANEL
ALK PHOS: 78 U/L (ref 38–126)
ALT: 22 U/L (ref 14–54)
ANION GAP: 12 (ref 5–15)
AST: 22 U/L (ref 15–41)
Albumin: 4.2 g/dL (ref 3.5–5.0)
BILIRUBIN TOTAL: 0.4 mg/dL (ref 0.3–1.2)
BUN: 10 mg/dL (ref 6–20)
CALCIUM: 9.5 mg/dL (ref 8.9–10.3)
CO2: 23 mmol/L (ref 22–32)
Chloride: 103 mmol/L (ref 101–111)
Creatinine, Ser: 0.64 mg/dL (ref 0.44–1.00)
GLUCOSE: 131 mg/dL — AB (ref 65–99)
POTASSIUM: 3.8 mmol/L (ref 3.5–5.1)
Sodium: 138 mmol/L (ref 135–145)
TOTAL PROTEIN: 7.5 g/dL (ref 6.5–8.1)

## 2016-08-12 LAB — SALICYLATE LEVEL

## 2016-08-12 LAB — I-STAT BETA HCG BLOOD, ED (MC, WL, AP ONLY)

## 2016-08-12 LAB — ACETAMINOPHEN LEVEL

## 2016-08-12 LAB — ETHANOL

## 2016-08-12 MED ORDER — RISPERIDONE 0.5 MG PO TABS
0.5000 mg | ORAL_TABLET | Freq: Two times a day (BID) | ORAL | Status: DC
  Administered 2016-08-13 – 2016-08-14 (×2): 0.5 mg via ORAL
  Filled 2016-08-12 (×3): qty 1

## 2016-08-12 MED ORDER — ZIPRASIDONE MESYLATE 20 MG IM SOLR
20.0000 mg | INTRAMUSCULAR | Status: AC
  Administered 2016-08-12: 20 mg via INTRAMUSCULAR
  Filled 2016-08-12: qty 20

## 2016-08-12 MED ORDER — ONDANSETRON HCL 4 MG PO TABS: 4.0000 mg | ORAL_TABLET | Freq: Three times a day (TID) | ORAL | Status: DC | PRN

## 2016-08-12 MED ORDER — LORAZEPAM 2 MG/ML IJ SOLN
2.0000 mg | INTRAMUSCULAR | Status: AC
Start: 2016-08-12 — End: 2016-08-12
  Administered 2016-08-12: 2 mg via INTRAMUSCULAR
  Filled 2016-08-12: qty 1

## 2016-08-12 MED ORDER — LORAZEPAM 2 MG/ML IJ SOLN
2.0000 mg | INTRAMUSCULAR | Status: AC
  Administered 2016-08-12: 2 mg via INTRAMUSCULAR
  Filled 2016-08-12: qty 1

## 2016-08-12 MED ORDER — DIPHENHYDRAMINE HCL 50 MG/ML IJ SOLN
50.0000 mg | INTRAMUSCULAR | Status: AC
  Administered 2016-08-12: 50 mg via INTRAMUSCULAR
  Filled 2016-08-12: qty 1

## 2016-08-12 MED ORDER — STERILE WATER FOR INJECTION IJ SOLN
INTRAMUSCULAR | Status: AC
  Administered 2016-08-12: 2 mL
  Filled 2016-08-12: qty 10

## 2016-08-12 MED ORDER — METHIMAZOLE 10 MG PO TABS
10.0000 mg | ORAL_TABLET | Freq: Two times a day (BID) | ORAL | Status: DC
  Administered 2016-08-12 – 2016-08-14 (×4): 10 mg via ORAL
  Filled 2016-08-12 (×6): qty 1

## 2016-08-12 MED ORDER — ACETAMINOPHEN 325 MG PO TABS: 650.0000 mg | ORAL_TABLET | ORAL | Status: DC | PRN

## 2016-08-12 MED ORDER — LAMOTRIGINE 25 MG PO TABS
25.0000 mg | ORAL_TABLET | Freq: Every day | ORAL | Status: DC
  Administered 2016-08-14: 25 mg via ORAL
  Filled 2016-08-12 (×2): qty 1

## 2016-08-12 MED ORDER — ALUM & MAG HYDROXIDE-SIMETH 200-200-20 MG/5ML PO SUSP: 30.0000 mL | Freq: Four times a day (QID) | ORAL | Status: DC | PRN
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
## 2016-08-12 NOTE — ED Notes (Signed)
Patient continually dancing in the hallways, cursing, labile and with paranoia. Patient cursing her family members loudly on the phone and needing frequent redirection. Patient then standing out in the hallway loudly stating "My fucking family is trying to kill me! Those motherfuckers want my kid dead too!". Patient instructed to lower her voice and to stop using profanity. Redirection unsuccessful thus far. Frederico Hamman, [place] notified of behaviors; new orders received.
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
## 2016-08-12 NOTE — ED Notes (Signed)
This Probation officer attempted to convince patient to take medications without having to have officers or security in her room. Patient refusing to get injections after several attempts. Security called for assistance; medication given without incident. No physical distress noted.

## 2016-08-12 NOTE — ED Notes (Signed)
Pt took a shower and she is now walking around the unit, dancing, laughing, and her hair is hanging down over her face.

## 2016-08-12 NOTE — ED Notes (Signed)
Pt made aware that urine sample is needed. Pt states she will work with [place] but she hasn't had much to drink and requested more water. Pt provided with water. Staff will attempt to collect urine specimen shortly.

## 2016-08-12 NOTE — ED Triage Notes (Signed)
Patient arrives by [facility] under IVC for SI, hallucinating.

## 2016-08-12 NOTE — ED Notes (Signed)
Pt asleep during vitals. RN notified that patient was not woken up. Vitals will be taken when patient wakes up.

## 2016-08-12 NOTE — Progress Notes (Signed)
08/12/16 1338:  LRT went to pt room to offer activities, pt was sleep.  Victorino Sparrow, LRT/CTRS

## 2016-08-12 NOTE — ED Notes (Signed)
Bed: MM38 Expected date:  Expected time:  Means of arrival:  Comments: Combative patient

## 2016-08-12 NOTE — ED Notes (Signed)
Pt has been sleeping today except to go to the bathroom.

## 2016-08-12 NOTE — ED Notes (Signed)
Introduced self to patient. Pt oriented to unit expectations.  Assessed pt for:  A) Anxiety &/or agitation: Pt slept most of the day, but woke up this afternoon. She is anxious, crying, blaming her family for being here, for hating her, for "beating me up for 40 years", for favoring her brother, for kicking her out. She cries about this over and over. She will not redirect from this topic to talk about anything else with this Probation officer.   S) Safety: Safety maintained with q-15-minute checks and hourly rounds by staff.  A) ADLs: Pt able to perform ADLs independently.  P) Pick-Up (room cleanliness): Pt's room clean and free of clutter.

## 2016-08-12 NOTE — ED Notes (Signed)
Patient very disorganized and paranoid about police officers and security guards stating "I don't want them around me or to see me! People are out to kill me and my family wants me dead! No officers, I can't. I want to date a cop, but not one that wants me dead!" Patient then twirling around her room chanting to herself and saying "The television tells my family to kill me! Those mother fuckers! They just tell me to take pills and eat shit!". Patient continues to repeatedly refer to police officers and appears to be paranoid of others. Patient needing frequent redirection due to yelling on the unit, and requires reassurance of her safety.  No signs of distress noted. Patient given sandwich and water on request.

## 2016-08-12 NOTE — ED Provider Notes (Addendum)
[facility] DEPT Provider Note: Veronica Spurling, MD, FACEP  CSN: [id number] MRN: [id number] ARRIVAL: 08/12/16 at 0001 ROOM: WA11/WA11   CHIEF COMPLAINT  IVC   HISTORY OF PRESENT ILLNESS  Veronica Perez is a 41 y.o. female with a history of bipolar disorder and hyperthyroidism noncompliant with her methimazole. She was involuntarily committed by a family member because she is reportedly hallucinating, stating her family is trying to kill her and start her. She has not been sleeping. She was combative with transporting officers prior to arrival.  The patient is repeatedly stating that her family (parents and brother) is trying to kill her, to start her, is keeping her held prisoner, will not let her get off food stamps. She is complaining of epigastric pain from lack of food as well as bilateral ear pain. She denies SI or HI. She knows she is at [facility]. She knows the years 2018 but states she does not know the day of the week or date because "I'm a f---ing prisoner".   Past Medical History:  Diagnosis Date  . Bipolar disorder ([place])   . Depression   . Panic attacks     Past Surgical History:  Procedure Laterality Date  . ADENOIDECTOMY    . OVARIAN CYST SURGERY    . TONSILLECTOMY      Family History  Problem Relation Age of Onset  . Diabetes Father   . Heart disease Father   . Cancer Maternal Grandmother        breast cancer    Social History  Substance Use Topics  . Smoking status: Former Smoker    Quit date: 01/18/2012  . Smokeless tobacco: Never Used  . Alcohol use No    Prior to Admission medications   Medication Sig Start Date End Date Taking? Authorizing Provider  acetaminophen (TYLENOL) 500 MG tablet Take 1,000 mg by mouth every 6 (six) hours as needed for mild pain, moderate pain, fever or headache.    Yes [provider]  [name] (EUCRISA) 2 % OINT Apply 1 application topically 2 (two) times daily.   Yes [provider]    methimazole (TAPAZOLE) 10 MG tablet Take 1 tablet (10 mg total) by mouth 2 (two) times daily. 06/12/16  Yes Nche, Charlene Brooke, NP    Allergies Penicillins   REVIEW OF SYSTEMS  Negative except as noted here or in the History of Present Illness.   PHYSICAL EXAMINATION  Initial Vital Signs Blood pressure (!) 149/117, pulse (!) 116, temperature 99.4 F (37.4 C), temperature source Oral, resp. rate (!) 22, height 5\' 4"  (1.626 m), weight 87.1 kg (192 lb), SpO2 98 %.  Examination General: Well-developed, well-nourished female in no acute distress; appearance consistent with age of record HENT: normocephalic; atraumatic; TMs normal Eyes: pupils equal, round and reactive to light; extraocular muscles intact Neck: supple Heart: regular rate and rhythm Lungs: clear to auscultation bilaterally Abdomen: soft; nondistended; nontender; no masses or hepatosplenomegaly; bowel sounds present Extremities: No deformity; full range of motion; pulses normal Neurologic: Awake, alert and oriented x 2; motor function intact in all extremities and symmetric; no facial droop Skin: Warm and dry Psychiatric: Pressured speech; persistently shouting about her family trying to kill her, keeping her prisoner, and keeping her from succeeding in life; frequent use of obscenities   RESULTS  Summary of this visit's results, reviewed by myself:   EKG Interpretation  Date/Time:    Ventricular Rate:    PR Interval:    QRS Duration:  QT Interval:    QTC Calculation:   R Axis:     Text Interpretation:        Laboratory Studies: Results for orders placed or performed during the hospital encounter of 08/12/16 (from the past 24 hour(s))  Comprehensive metabolic panel     Status: Abnormal   Collection Time: 08/12/16 12:27 AM  Result Value Ref Range   Sodium 138 135 - 145 mmol/L   Potassium 3.8 3.5 - 5.1 mmol/L   Chloride 103 101 - 111 mmol/L   CO2 23 22 - 32 mmol/L   Glucose, Bld 131 (H) 65 - 99 mg/dL    BUN 10 6 - 20 mg/dL   Creatinine, Ser 0.64 0.44 - 1.00 mg/dL   Calcium 9.5 8.9 - 10.3 mg/dL   Total Protein 7.5 6.5 - 8.1 g/dL   Albumin 4.2 3.5 - 5.0 g/dL   AST 22 15 - 41 U/L   ALT 22 14 - 54 U/L   Alkaline Phosphatase 78 38 - 126 U/L   Total Bilirubin 0.4 0.3 - 1.2 mg/dL   GFR calc non Af Amer >60 >60 mL/min   GFR calc Af Amer >60 >60 mL/min   Anion gap 12 5 - 15  Ethanol     Status: None   Collection Time: 08/12/16 12:27 AM  Result Value Ref Range   Alcohol, Ethyl (B) <5 <5 mg/dL  Salicylate level     Status: None   Collection Time: 08/12/16 12:27 AM  Result Value Ref Range   Salicylate Lvl <6.7 2.8 - 30.0 mg/dL  Acetaminophen level     Status: Abnormal   Collection Time: 08/12/16 12:27 AM  Result Value Ref Range   Acetaminophen (Tylenol), Serum <10 (L) 10 - 30 ug/mL  cbc     Status: Abnormal   Collection Time: 08/12/16 12:27 AM  Result Value Ref Range   WBC 13.4 (H) 4.0 - 10.5 K/uL   RBC 4.94 3.87 - 5.11 MIL/uL   Hemoglobin 13.3 12.0 - 15.0 g/dL   HCT 37.8 36.0 - 46.0 %   MCV 76.5 (L) 78.0 - 100.0 fL   MCH 26.9 26.0 - 34.0 pg   MCHC 35.2 30.0 - 36.0 g/dL   RDW 13.0 11.5 - 15.5 %   Platelets 350 150 - 400 K/uL  I-Stat beta hCG blood, ED     Status: None   Collection Time: 08/12/16 12:34 AM  Result Value Ref Range   I-stat hCG, quantitative <5.0 <5 mIU/mL   Comment 3           Imaging Studies: No results found.  ED COURSE  Nursing notes and initial vitals signs, including pulse oximetry, reviewed.  Vitals:   08/12/16 0009 08/12/16 0010 08/12/16 0139  BP: (!) 149/117  125/75  Pulse: (!) 116  97  Resp: (!) 22  15  Temp: 99.4 F (37.4 C)    TempSrc: Oral    SpO2: 98%  100%  Weight:  87.1 kg (192 lb)   Height:  5\' 4"  (1.626 m)     PROCEDURES    ED DIAGNOSES     ICD-10-CM   1. Delusional disorder ([place]) F22   2. Hyperthyroidism E05.90        Rhiann Boucher, Jenny Reichmann, MD 08/12/16 0140    Shanon Rosser, MD 08/12/16 [phone]

## 2016-08-12 NOTE — ED Notes (Signed)
Patient is sleeping at this time.

## 2016-08-12 NOTE — ED Notes (Signed)
Patient up and dancing in the halls laughing one minute, then cursing about her family the next. Patient angry about her brother and states "That motherfucker put me in here so he could get the house in the will. He wants me dead". Patient steadily raising her voice and needing frequent redirection. Patient states "I ain't gonna take any more medicine tonight!".  Patient with no signs of distress noted. Pt denies suicidal or homicidal ideations, but continues to display paranoid thoughts and behaviors.

## 2016-08-12 NOTE — ED Notes (Signed)
Bed: TAV69 Expected date:  Expected time:  Means of arrival:  Comments: Hold for bed 11

## 2016-08-13 DIAGNOSIS — G4700 Insomnia, unspecified: Secondary | ICD-10-CM

## 2016-08-13 DIAGNOSIS — R443 Hallucinations, unspecified: Secondary | ICD-10-CM | POA: Diagnosis not present

## 2016-08-13 DIAGNOSIS — Z87891 Personal history of nicotine dependence: Secondary | ICD-10-CM | POA: Diagnosis not present

## 2016-08-13 DIAGNOSIS — F3110 Bipolar disorder, current episode manic without psychotic features, unspecified: Secondary | ICD-10-CM | POA: Diagnosis not present

## 2016-08-13 DIAGNOSIS — F22 Delusional disorders: Secondary | ICD-10-CM | POA: Diagnosis not present

## 2016-08-13 MED ORDER — HALOPERIDOL LACTATE 5 MG/ML IJ SOLN
10.0000 mg | Freq: Once | INTRAMUSCULAR | Status: AC
  Administered 2016-08-13: 10 mg via INTRAMUSCULAR
  Filled 2016-08-13: qty 2

## 2016-08-13 MED ORDER — DIPHENHYDRAMINE HCL 50 MG/ML IJ SOLN
50.0000 mg | Freq: Once | INTRAMUSCULAR | Status: AC
Start: 2016-08-13 — End: 2016-08-13
  Administered 2016-08-13: 50 mg via INTRAMUSCULAR
  Filled 2016-08-13: qty 1

## 2016-08-13 MED ORDER — LORAZEPAM 2 MG/ML IJ SOLN
2.0000 mg | Freq: Once | INTRAMUSCULAR | Status: AC
  Administered 2016-08-13: 2 mg via INTRAMUSCULAR
  Filled 2016-08-13: qty 1

## 2016-08-13 NOTE — ED Notes (Signed)
Patient up to the bathroom. Pt with no inappropriate behaviors noted. Pt assisted back to bed without incident. No signs of distress noted.

## 2016-08-13 NOTE — ED Notes (Addendum)
Pt refused psych medications because she does not want her mind to be clouded. She said, "I have to be able to protect my daughter or they will kill her."  She continues to blame others for her situation and will not entertain the discussion that she is obsessive, repetitive and blaming others. She dances in the hallway, but redirects to her room.

## 2016-08-13 NOTE — Consult Note (Signed)
[facility] Consult   Reason for Consult:  Psychiatric evaluation Referring Physician:  EDP Patient Identification: [name] MRN:  [id number] Principal Diagnosis: Bipolar I disorder, most recent episode (or current) manic ([place]) Diagnosis:   Patient Active Problem List   Diagnosis Date Noted  . Bipolar I disorder, most recent episode (or current) manic ([place]) [F31.10] 12/22/2012    Priority: High  . 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 [F41.0] 12/22/2012    Priority: High  . Low TSH level [R94.6] 05/30/2016  . Heart palpitations [R00.2] 05/30/2016  . BIPOLAR DISORDER [F31.9] 04/02/2006  . PANIC ATTACKS [F41.0] 04/02/2006  . TOBACCO DEPENDENCE [F17.200] 04/02/2006  . RHINITIS, ALLERGIC [J30.9] 04/02/2006  . GASTROESOPHAGEAL REFLUX, NO ESOPHAGITIS [K21.9] 04/02/2006    Total Time spent with patient: 45 minutes  Subjective:   Veronica Perez is a 41 y.o. female patient admitted with worsening delusions and non-compliant with medications.  HPI:  Patient with history of Bipolar disorder and psychosis who was IVC'd by her family due to worsening psychosis and suicidal ideations.. Patient behavior and thought process is very disorganized, she has a fixed delusions that her family and people in general are out to get her. Patient states; " People are out to kill me and my family wants me dead! I want to protect my daughter, my family are going to kill her.'' Patient is easily agitated, defiant, oppositional, belligerent and difficult to re-direct.   Past Psychiatric History: as above  Risk to Self: Suicidal Ideation: No Suicidal Intent: No Is patient at risk for suicide?: No Suicidal Plan?: No Access to Means: No What has been your use of drugs/alcohol within the last 12 months?: Denies use  How many times?:  (unknown) Other Self Harm Risks:  (delusions) Triggers for Past Attempts: None known Intentional Self Injurious Behavior: None Risk  to Others: Homicidal Ideation: No Thoughts of Harm to Others: No Current Homicidal Intent: No Current Homicidal Plan: No Access to Homicidal Means: No Identified Victim: none History of harm to others?: No Assessment of Violence: None Noted Violent Behavior Description: no Does patient have access to weapons?: No Criminal Charges Pending?: No Does patient have a court date: No Prior Inpatient Therapy: Prior Inpatient Therapy: Yes Prior Therapy Dates: multiple Prior Therapy Facilty/Provider(s): muliple Prior Outpatient Therapy: Prior Outpatient Therapy: Yes Prior Therapy Dates: ongoing Prior Therapy Facilty/Provider(s): Dr. Reece Levy  Reason for Treatment: delusions Does patient have an ACCT team?: No Does patient have Intensive In-House Services?  : No Does patient have [facility] services? : No Does patient have P4CC services?: No  Past Medical History:  Past Medical History:  Diagnosis Date  . Bipolar disorder ([place])   . Depression   . Panic attacks     Past Surgical History:  Procedure Laterality Date  . ADENOIDECTOMY    . OVARIAN CYST SURGERY    . TONSILLECTOMY     Family History:  Family History  Problem Relation Age of Onset  . Diabetes Father   . Heart disease Father   . Cancer Maternal Grandmother        breast cancer   Family Psychiatric  History:  Social History:  History  Alcohol Use No     History  Drug Use No    Social History   Social History  . Marital status: Single    Spouse name: N/A  . Number of children: N/A  . Years of education: N/A   Social History Main Topics  . Smoking status:  Former Smoker    Quit date: 01/18/2012  . Smokeless tobacco: Never Used  . Alcohol use No  . Drug use: No  . Sexual activity: Not Asked   Other Topics Concern  . None   Social History Narrative  . None   Additional Social History:    Allergies:   Allergies  Allergen Reactions  . Penicillins Hives, Itching, Nausea And Vomiting and Other (See  Comments)    Has patient had a PCN reaction causing immediate rash, facial/tongue/throat swelling, SOB or lightheadedness with hypotension: No Has patient had a PCN reaction causing severe rash involving mucus membranes or skin necrosis: No Has patient had a PCN reaction that required hospitalization: No Has patient had a PCN reaction occurring within the last 10 years: No If all of the above answers are "NO", then may proceed with Cephalosporin use.    Labs:  Results for orders placed or performed during the hospital encounter of 08/12/16 (from the past 48 hour(s))  Comprehensive metabolic panel     Status: Abnormal   Collection Time: 08/12/16 12:27 AM  Result Value Ref Range   Sodium 138 135 - 145 mmol/L   Potassium 3.8 3.5 - 5.1 mmol/L   Chloride 103 101 - 111 mmol/L   CO2 23 22 - 32 mmol/L   Glucose, Bld 131 (H) 65 - 99 mg/dL   BUN 10 6 - 20 mg/dL   Creatinine, Ser 0.64 0.44 - 1.00 mg/dL   Calcium 9.5 8.9 - 10.3 mg/dL   Total Protein 7.5 6.5 - 8.1 g/dL   Albumin 4.2 3.5 - 5.0 g/dL   AST 22 15 - 41 U/L   ALT 22 14 - 54 U/L   Alkaline Phosphatase 78 38 - 126 U/L   Total Bilirubin 0.4 0.3 - 1.2 mg/dL   GFR calc non Af Amer >60 >60 mL/min   GFR calc Af Amer >60 >60 mL/min    Comment: (NOTE) The eGFR has been calculated using the CKD EPI equation. This calculation has not been validated in all clinical situations. eGFR's persistently <60 mL/min signify possible Chronic Kidney Disease.    Anion gap 12 5 - 15  Ethanol     Status: None   Collection Time: 08/12/16 12:27 AM  Result Value Ref Range   Alcohol, Ethyl (B) <5 <5 mg/dL    Comment:        LOWEST DETECTABLE LIMIT FOR SERUM ALCOHOL IS 5 mg/dL FOR MEDICAL PURPOSES ONLY   Salicylate level     Status: None   Collection Time: 08/12/16 12:27 AM  Result Value Ref Range   Salicylate Lvl <3.5 2.8 - 30.0 mg/dL  Acetaminophen level     Status: Abnormal   Collection Time: 08/12/16 12:27 AM  Result Value Ref Range    Acetaminophen (Tylenol), Serum <10 (L) 10 - 30 ug/mL    Comment:        THERAPEUTIC CONCENTRATIONS VARY SIGNIFICANTLY. A RANGE OF 10-30 ug/mL MAY BE AN EFFECTIVE CONCENTRATION FOR MANY PATIENTS. HOWEVER, SOME ARE BEST TREATED AT CONCENTRATIONS OUTSIDE THIS RANGE. ACETAMINOPHEN CONCENTRATIONS >150 ug/mL AT 4 HOURS AFTER INGESTION AND >50 ug/mL AT 12 HOURS AFTER INGESTION ARE OFTEN ASSOCIATED WITH TOXIC REACTIONS.   cbc     Status: Abnormal   Collection Time: 08/12/16 12:27 AM  Result Value Ref Range   WBC 13.4 (H) 4.0 - 10.5 K/uL   RBC 4.94 3.87 - 5.11 MIL/uL   Hemoglobin 13.3 12.0 - 15.0 g/dL   HCT 37.8 36.0 -  46.0 %   MCV 76.5 (L) 78.0 - 100.0 fL   MCH 26.9 26.0 - 34.0 pg   MCHC 35.2 30.0 - 36.0 g/dL   RDW 13.0 11.5 - 15.5 %   Platelets 350 150 - 400 K/uL  I-Stat beta hCG blood, ED     Status: None   Collection Time: 08/12/16 12:34 AM  Result Value Ref Range   I-stat hCG, quantitative <5.0 <5 mIU/mL   Comment 3            Comment:   GEST. AGE      CONC.  (mIU/mL)   <=1 WEEK        5 - 50     2 WEEKS       50 - 500     3 WEEKS       100 - 10,000     4 WEEKS     1,000 - 30,000        FEMALE AND NON-PREGNANT FEMALE:     LESS THAN 5 mIU/mL   Rapid urine drug screen (hospital performed)     Status: None   Collection Time: 08/12/16  9:40 AM  Result Value Ref Range   Opiates NONE DETECTED NONE DETECTED   Cocaine NONE DETECTED NONE DETECTED   Benzodiazepines NONE DETECTED NONE DETECTED   Amphetamines NONE DETECTED NONE DETECTED   Tetrahydrocannabinol NONE DETECTED NONE DETECTED   Barbiturates NONE DETECTED NONE DETECTED    Comment:        DRUG SCREEN FOR MEDICAL PURPOSES ONLY.  IF CONFIRMATION IS NEEDED FOR ANY PURPOSE, NOTIFY LAB WITHIN 5 DAYS.        LOWEST DETECTABLE LIMITS FOR URINE DRUG SCREEN Drug Class       Cutoff (ng/mL) Amphetamine      1000 Barbiturate      200 Benzodiazepine   916 Tricyclics       384 Opiates          300 Cocaine          300 THC               50     Current Facility-Administered Medications  Medication Dose Route Frequency Provider Last Rate Last Dose  . acetaminophen (TYLENOL) tablet 650 mg  650 mg Oral Q4H PRN Molpus, John, MD      . alum & mag hydroxide-simeth (MAALOX/MYLANTA) 200-200-20 MG/5ML suspension 30 mL  30 mL Oral Q6H PRN Molpus, John, MD      . diphenhydrAMINE (BENADRYL) injection 50 mg  50 mg Intramuscular Once Kacelyn Rowzee, MD      . haloperidol lactate (HALDOL) injection 10 mg  10 mg Intramuscular Once Tranesha Lessner, MD      . lamoTRIgine (LAMICTAL) tablet 25 mg  25 mg Oral Daily Corena Pilgrim, MD   Stopped at 08/12/16 1212  . LORazepam (ATIVAN) injection 2 mg  2 mg Intramuscular Once Deshannon Seide, MD      . methimazole (TAPAZOLE) tablet 10 mg  10 mg Oral BID Molpus, John, MD   10 mg at 08/13/16 0910  . ondansetron (ZOFRAN) tablet 4 mg  4 mg Oral Q8H PRN Molpus, John, MD      . risperiDONE (RISPERDAL) tablet 0.5 mg  0.5 mg Oral BID Corena Pilgrim, MD   Stopped at 08/12/16 1211   Current Outpatient Prescriptions  Medication Sig Dispense Refill  . acetaminophen (TYLENOL) 500 MG tablet Take 1,000 mg by mouth every 6 (six) hours as needed for mild  pain, moderate pain, fever or headache.     Stasia Cavalier (EUCRISA) 2 % OINT Apply 1 application topically 2 (two) times daily.    . methimazole (TAPAZOLE) 10 MG tablet Take 1 tablet (10 mg total) by mouth 2 (two) times daily. 60 tablet 0    Musculoskeletal: Strength & Muscle Tone: within normal limits Gait & Station: normal Patient leans: N/A  Psychiatric Specialty Exam: Physical Exam  Psychiatric: Her affect is angry and labile. Her speech is rapid and/or pressured. She is agitated, aggressive and actively hallucinating. Thought content is paranoid and delusional. Cognition and memory are normal. She expresses impulsivity.    Review of Systems  Constitutional: Negative.   HENT: Negative.   Eyes: Negative.   Respiratory: Negative.    Cardiovascular: Negative.   Gastrointestinal: Negative.   Genitourinary: Negative.   Musculoskeletal: Negative.   Skin: Negative.   Neurological: Negative.   Endo/Heme/Allergies: Negative.   Psychiatric/Behavioral: Positive for hallucinations. The patient is nervous/anxious and has insomnia.     Blood pressure 133/67, pulse 88, temperature 98.6 F (37 C), temperature source Oral, resp. rate 15, height 5' 4"  (1.626 m), weight 87.1 kg (192 lb), SpO2 99 %.Body mass index is 32.96 kg/m.  General Appearance: Bizarre  Eye Contact:  Minimal  Speech:  Pressured  Volume:  Increased  Mood:  Angry and Irritable  Affect:  Labile  Thought Process:  Disorganized  Orientation:  Full (Time, Place, and Person)  Thought Content:  Illogical and Delusions  Suicidal Thoughts:  No  Homicidal Thoughts:  No  Memory:  Immediate;   Fair Recent;   Fair Remote;   Fair  Judgement:  Poor  Insight:  Lacking  Psychomotor Activity:  Increased  Concentration:  Concentration: Fair and Attention Span: Fair  Recall:  [employer] of Knowledge:  Fair  Language:  Good  Akathisia:  No  Handed:  Right  AIMS (if indicated):     Assets:  Communication Skills Social Support  ADL's:  Intact  Cognition:  WNL  Sleep:   poor     Treatment Plan Summary: Daily contact with patient to assess and evaluate symptoms and progress in treatment and Medication management Start Risperdal 0.5 mg bid for delusions and Lamictal 25 mg daily for agitated mood  Disposition: Recommend psychiatric Inpatient admission when medically cleared.  Corena Pilgrim, MD 08/13/2016 11:06 AM

## 2016-08-13 NOTE — Progress Notes (Signed)
08/13/16 1400:  LRT went to pt room to offer activities, pt was sleep.  Victorino Sparrow, LRT/CTRS

## 2016-08-13 NOTE — ED Notes (Addendum)
Greeted pt and assessed pt for:  A) Anxiety &/or agitation: Pt woke up early and is anxious and tearful. She continues to ruminate about her family relations, "My  Dad keeps throwing me in the mental hospital so that my brother can get the house."  She has shown attention to her hygiene and appearance. She used paper towels to curl her hair last night and her hair is curled and clean, but she continues to wear it covering her face. She is not spinning around and cursing.   S) Safety: Safety maintained with q-15-minute checks and hourly rounds by staff.  A) ADLs: Pt able to perform ADLs independently.  P) Pick-Up (room cleanliness): Pt's room clean and free of clutter.

## 2016-08-13 NOTE — [facility] Assessment (Signed)
[facility] Assessment Progress Note  Per Mojeed Akintayo, MD, this pt requires psychiatric hospitalization at this time.  Pt presents under IVC initiated by her mother and upheld by Dr Akintayo.  The following facilities have been contacted to seek placement for this pt, with results as noted:  Beds available, information sent, decision pending:  [facility]   At capacity:  [facility]   Devon Kingdon, MA Triage Specialist [phone]     

## 2016-08-13 NOTE — ED Notes (Signed)
Patient cooperative at this time and is compliant with her HS medications. No distress noted. Sandwich and juice provided upon request.

## 2016-08-13 NOTE — ED Notes (Signed)
Pt became hysterical because she is not going home today, yelling, blaming and delusional. Medications were given IM. Pt resisted the medication, but allowed it with security staff holding her hands to steady and reassure her.

## 2016-08-13 NOTE — ED Notes (Signed)
Patient approaching nursing station and immediately began to yell and curse about her brother and parents. Staff redirecting behaviors and setting limits. Patient angrily walked back to her room. Pt with no signs of physical distress noted.

## 2016-08-14 DIAGNOSIS — F3110 Bipolar disorder, current episode manic without psychotic features, unspecified: Secondary | ICD-10-CM

## 2016-08-14 DIAGNOSIS — F22 Delusional disorders: Secondary | ICD-10-CM | POA: Diagnosis not present

## 2016-08-14 DIAGNOSIS — Z87891 Personal history of nicotine dependence: Secondary | ICD-10-CM

## 2016-08-14 NOTE — BH Assessment (Signed)
[facility] Assessment Progress Note  Per [name], MD, this pt requires psychiatric hospitalization at this time.  Pt presents under IVC initiated by her mother, which Corena Pilgrim, MD, has upheld.  At 10:39 Roderic Palau calls from [facility] to report that pt has been accepted to their facility by Dr Dareen Piano to the [facility]  Pt's nurse, Nicoletta Dress, has been notified, and agrees to call report to [phone].  Pt is to be transported via [facility].  Jalene Mullet, [place] Triage Specialist [phone]

## 2016-08-14 NOTE — Consult Note (Signed)
[place] Psychiatry Consult   Reason for Consult:  Psychiatric evaluation Referring Physician:  EDP Patient Identification: Veronica Perez MRN:  [id number] Principal Diagnosis: Bipolar I disorder, most recent episode (or current) manic ([place]) Diagnosis:   Patient Active Problem List   Diagnosis Date Noted  . Bipolar I disorder, most recent episode (or current) manic ([place]) [F31.10] 12/22/2012    Priority: High  . PANIC ATTACKS [F41.0] 04/02/2006    Priority: High  . Low TSH level [R94.6] 05/30/2016  . Heart palpitations [R00.2] 05/30/2016  . 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 TOBACCO DEPENDENCE [F17.200] 04/02/2006  . RHINITIS, ALLERGIC [J30.9] 04/02/2006  . GASTROESOPHAGEAL REFLUX, NO ESOPHAGITIS [K21.9] 04/02/2006    Total Time spent with patient: 30 minutes  Subjective:   Veronica Perez is a 41 y.o. female patient admitted with worsening delusions and non-compliant with medications.  HPI:  Patient with history of Bipolar disorder and psychosis who was IVC'd by her family due to worsening psychosis and suicidal ideations.. Patient behavior and thought process is very disorganized, she has a fixed delusions that her family and people in general are out to get her. Patient states; " People are out to kill me and my family wants me dead! I want to protect my daughter, my family are going to kill her.'' Patient is easily agitated, defiant, oppositional, belligerent and difficult to re-direct.  Today, she has improved and not angry anymore but still has no insight into her bipolar disorder.  Increase in energy, tangential, pressured speech improved.  Sleep is still an issue. Transfer to [facility].  Past Psychiatric History: Bipolar d/o  Risk to Self: Suicidal Ideation: No Suicidal Intent: No Is patient at risk for suicide?: No Suicidal Plan?: No Access to Means: No What has been your use of drugs/alcohol within the last 12  months?: Denies use  How many times?:  (unknown) Other Self Harm Risks:  (delusions) Triggers for Past Attempts: None known Intentional Self Injurious Behavior: None Risk to Others: Homicidal Ideation: No Thoughts of Harm to Others: No Current Homicidal Intent: No Current Homicidal Plan: No Access to Homicidal Means: No Identified Victim: none History of harm to others?: No Assessment of Violence: None Noted Violent Behavior Description: no Does patient have access to weapons?: No Criminal Charges Pending?: No Does patient have a court date: No Prior Inpatient Therapy: Prior Inpatient Therapy: Yes Prior Therapy Dates: multiple Prior Therapy Facilty/Provider(s): muliple Prior Outpatient Therapy: Prior Outpatient Therapy: Yes Prior Therapy Dates: ongoing Prior Therapy Facilty/Provider(s): Dr. Reece Levy  Reason for Treatment: delusions Does patient have an ACCT team?: No Does patient have Intensive In-House Services?  : No Does patient have [facility] services? : No Does patient have P4CC services?: No  Past Medical History:  Past Medical History:  Diagnosis Date  . Bipolar disorder ([place])   . Depression   . Panic attacks     Past Surgical History:  Procedure Laterality Date  . ADENOIDECTOMY    . OVARIAN CYST SURGERY    . TONSILLECTOMY     Family History:  Family History  Problem Relation Age of Onset  . Diabetes Father   . Heart disease Father   . Cancer Maternal Grandmother        breast cancer   Family Psychiatric  History:  Social History:  History  Alcohol Use No     History  Drug Use No    Social History   Social History  . Marital status:  Single    Spouse name: N/A  . Number of children: N/A  . Years of education: N/A   Social History Main Topics  . Smoking status: Former Smoker    Quit date: 01/18/2012  . Smokeless tobacco: Never Used  . Alcohol use No  . Drug use: No  . Sexual activity: Not Asked   Other Topics Concern  . None   Social  History Narrative  . None   Additional Social History:    Allergies:   Allergies  Allergen Reactions  . Penicillins Hives, Itching, Nausea And Vomiting and Other (See Comments)    Has patient had a PCN reaction causing immediate rash, facial/tongue/throat swelling, SOB or lightheadedness with hypotension: No Has patient had a PCN reaction causing severe rash involving mucus membranes or skin necrosis: No Has patient had a PCN reaction that required hospitalization: No Has patient had a PCN reaction occurring within the last 10 years: No If all of the above answers are "NO", then may proceed with Cephalosporin use.    Labs:  No results found for this or any previous visit (from the past 48 hour(s)).  Current Facility-Administered Medications  Medication Dose Route Frequency Provider Last Rate Last Dose  . acetaminophen (TYLENOL) tablet 650 mg  650 mg Oral Q4H PRN Molpus, John, MD      . alum & mag hydroxide-simeth (MAALOX/MYLANTA) 200-200-20 MG/5ML suspension 30 mL  30 mL Oral Q6H PRN Molpus, John, MD      . lamoTRIgine (LAMICTAL) tablet 25 mg  25 mg Oral Daily Akintayo, Mojeed, MD   25 mg at 08/14/16 0957  . methimazole (TAPAZOLE) tablet 10 mg  10 mg Oral BID Molpus, John, MD   10 mg at 08/14/16 0957  . ondansetron (ZOFRAN) tablet 4 mg  4 mg Oral Q8H PRN Molpus, John, MD      . risperiDONE (RISPERDAL) tablet 0.5 mg  0.5 mg Oral BID Darleene Cleaver, Mojeed, MD   0.5 mg at 08/14/16 7824   Current Outpatient Prescriptions  Medication Sig Dispense Refill  . acetaminophen (TYLENOL) 500 MG tablet Take 1,000 mg by mouth every 6 (six) hours as needed for mild pain, moderate pain, fever or headache.     Stasia Cavalier (EUCRISA) 2 % OINT Apply 1 application topically 2 (two) times daily.    . methimazole (TAPAZOLE) 10 MG tablet Take 1 tablet (10 mg total) by mouth 2 (two) times daily. 60 tablet 0    Musculoskeletal: Strength & Muscle Tone: within normal limits Gait & Station: normal Patient  leans: N/A  Psychiatric Specialty Exam: Physical Exam  Constitutional: She is oriented to person, place, and time. She appears well-developed and well-nourished.  Neck: Normal range of motion.  Respiratory: Effort normal.  Musculoskeletal: Normal range of motion.  Neurological: She is alert and oriented to person, place, and time.  Psychiatric: Her affect is labile. Her speech is tangential. She is actively hallucinating. Thought content is paranoid and delusional. Cognition and memory are normal. She expresses impulsivity.    Review of Systems  Constitutional: Negative.   HENT: Negative.   Eyes: Negative.   Respiratory: Negative.   Cardiovascular: Negative.   Gastrointestinal: Negative.   Genitourinary: Negative.   Musculoskeletal: Negative.   Skin: Negative.   Neurological: Negative.   Endo/Heme/Allergies: Negative.   Psychiatric/Behavioral: Positive for hallucinations. The patient is nervous/anxious and has insomnia.     Blood pressure 120/72, pulse 80, temperature 98.1 F (36.7 C), temperature source Oral, resp. rate 18, height 5\' 4"  (1.626  m), weight 87.1 kg (192 lb), SpO2 100 %.Body mass index is 32.96 kg/m.  General Appearance: Bizarre  Eye Contact:  Minimal  Speech:  Pressured  Volume:  Increased  Mood:  Irritable  Affect:  Labile  Thought Process:  Disorganized  Orientation:  Full (Time, Place, and Person)  Thought Content:  Illogical and Delusions  Suicidal Thoughts:  No  Homicidal Thoughts:  No  Memory:  Immediate;   Fair Recent;   Fair Remote;   Fair  Judgement:  Poor  Insight:  Lacking  Psychomotor Activity:  Increased  Concentration:  Concentration: Fair and Attention Span: Fair  Recall:  [employer] of Knowledge:  Fair  Language:  Good  Akathisia:  No  Handed:  Right  AIMS (if indicated):     Assets:  Communication Skills Social Support  ADL's:  Intact  Cognition:  WNL  Sleep:   poor     Treatment Plan Summary: Daily contact with patient to  assess and evaluate symptoms and progress in treatment and Medication management Bipolar affective disorder, mania, severe with psychosis: -Crisis stabilization -Medication management: Continue Lamictal 25 mg daily for bipolar d/o and Risperdal 0.5 mg BID for psychosis -Individual counseling -Transfer to [facility]  Disposition: Recommend psychiatric Inpatient admission when medically cleared.  Waylan Boga, NP 08/14/2016 10:50 AM

## 2016-08-24 ENCOUNTER — Encounter (HOSPITAL_COMMUNITY): Payer: Self-pay | Admitting: Emergency Medicine

## 2016-08-24 DIAGNOSIS — Z87891 Personal history of nicotine dependence: Secondary | ICD-10-CM | POA: Insufficient documentation

## 2016-08-24 DIAGNOSIS — F319 Bipolar disorder, unspecified: Secondary | ICD-10-CM | POA: Insufficient documentation

## 2016-08-24 DIAGNOSIS — F22 Delusional disorders: Secondary | ICD-10-CM | POA: Insufficient documentation

## 2016-08-24 DIAGNOSIS — Z79899 Other long term (current) drug therapy: Secondary | ICD-10-CM | POA: Diagnosis not present

## 2016-08-24 LAB — CBC
HCT: 37.3 % (ref 36.0–46.0)
Hemoglobin: 12.7 g/dL (ref 12.0–15.0)
MCH: 26.4 pg (ref 26.0–34.0)
MCHC: 34 g/dL (ref 30.0–36.0)
MCV: 77.5 fL — AB (ref 78.0–100.0)
PLATELETS: 324 10*3/uL (ref 150–400)
RBC: 4.81 MIL/uL (ref 3.87–5.11)
RDW: 13.4 % (ref 11.5–15.5)
WBC: 11.4 10*3/uL — ABNORMAL HIGH (ref 4.0–10.5)

## 2016-08-24 LAB — I-STAT BETA HCG BLOOD, ED (MC, WL, AP ONLY)

## 2016-08-24 NOTE — ED Notes (Signed)
Patient continues stream of thought conversation with no on in the room.  Pt given [place] sandwich and water after blood draw.

## 2016-08-24 NOTE — ED Triage Notes (Signed)
Pt presents to ED for assessment with Sherriff Deputy voluntarily for assessment of hallucinations, thoughts of paranoia, and home disturbance.  Pt stating that her parents are trying to kill her.  Pt states she does not want to take her pills because "they are trying to put me asleep forever".  Pt denies SI/HI.  Pt has not taken meds since Friday, recently seen at a psychiatric hospital.

## 2016-08-25 ENCOUNTER — Emergency Department (HOSPITAL_COMMUNITY)
Admission: EM | Admit: 2016-08-25 | Discharge: 2016-08-25 | Disposition: A | Discharge status: Other Acute Inpatient Hospital | Payer: Medicare Other | Attending: Emergency Medicine | Admitting: Emergency Medicine

## 2016-08-25 DIAGNOSIS — F22 Delusional disorders: Secondary | ICD-10-CM

## 2016-08-25 LAB — COMPREHENSIVE METABOLIC PANEL
ALBUMIN: 4.1 g/dL (ref 3.5–5.0)
ALT: 25 U/L (ref 14–54)
ANION GAP: 7 (ref 5–15)
AST: 18 U/L (ref 15–41)
Alkaline Phosphatase: 73 U/L (ref 38–126)
BILIRUBIN TOTAL: 0.7 mg/dL (ref 0.3–1.2)
BUN: 6 mg/dL (ref 6–20)
CHLORIDE: 105 mmol/L (ref 101–111)
CO2: 24 mmol/L (ref 22–32)
Calcium: 9.1 mg/dL (ref 8.9–10.3)
Creatinine, Ser: 0.56 mg/dL (ref 0.44–1.00)
GFR calc Af Amer: >60 mL/min (ref >60)
Glucose, Bld: 103 mg/dL — ABNORMAL HIGH (ref 65–99)
POTASSIUM: 3.7 mmol/L (ref 3.5–5.1)
Sodium: 136 mmol/L (ref 135–145)
TOTAL PROTEIN: 7.1 g/dL (ref 6.5–8.1)

## 2016-08-25 LAB — SALICYLATE LEVEL: Salicylate Lvl: <7 mg/dL (ref 2.8–30.0)

## 2016-08-25 LAB — ACETAMINOPHEN LEVEL

## 2016-08-25 LAB — ETHANOL

## 2016-08-25 NOTE — ED Notes (Signed)
ED Provider at bedside. 
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
## 2016-08-25 NOTE — ED Notes (Signed)
Pt given food tray, pt currently eating with family in room.
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
## 2016-08-25 NOTE — ED Triage Notes (Signed)
IVC paperwork faxed to [facility] 

## 2016-08-25 NOTE — ED Notes (Signed)
TTS in progress 

## 2016-08-25 NOTE — ED Notes (Signed)
IVC paperwork faxed and accepted @ 0132. Originals placed in red folder.

## 2016-08-25 NOTE — ED Triage Notes (Signed)
Report called to [facility] in [place]. Report to [facility].

## 2016-08-25 NOTE — Progress Notes (Signed)
Patient has been accepted to [facility]. Accepting is Dr. Reece Levy. Number for report is [phone]. [place], bed is ready.   IVC paperwork is needed. Fax to [phone].  Luellen Pucker, RN notified.    Radonna Ricker MSW, [place] Disposition [phone]

## 2016-08-25 NOTE — ED Notes (Signed)
Declined W/C at D/C and was escorted to lobby by RN. 

## 2016-08-25 NOTE — Progress Notes (Signed)
Patient meets criteria for inpatient treatment. CSW faxed referrals to the following inpatient facilities for review:  [facility], [facility],  [facility], [facility],  [facility], [facility]      TTS will continue to seek bed placement.  Radonna Ricker MSW, [place] Disposition [phone]

## 2016-08-26 ENCOUNTER — Ambulatory Visit: Payer: Self-pay | Admitting: Nurse Practitioner

## 2016-09-09 ENCOUNTER — Encounter: Payer: Self-pay | Admitting: Nurse Practitioner

## 2016-09-09 ENCOUNTER — Ambulatory Visit (INDEPENDENT_AMBULATORY_CARE_PROVIDER_SITE_OTHER): Payer: Medicare Other | Admitting: Nurse Practitioner

## 2016-09-09 VITALS — BP 154/76 | HR 105 | Ht 64.0 in | Wt 189.0 lb

## 2016-09-09 DIAGNOSIS — F312 Bipolar disorder, current episode manic severe with psychotic features: Secondary | ICD-10-CM

## 2016-09-09 DIAGNOSIS — E0590 Thyrotoxicosis, unspecified without thyrotoxic crisis or storm: Secondary | ICD-10-CM

## 2016-09-09 MED ORDER — METHIMAZOLE 10 MG PO TABS: 10.0000 mg | ORAL_TABLET | Freq: Two times a day (BID) | ORAL | 1 refills | Status: DC

## 2016-09-09 NOTE — Patient Instructions (Addendum)
Call [facility] to schedule thyroid US: [phone]  You will be contacted to schedule appt with endocrinology.  Please maintain appts with psychiatry and take medications as prescribed.
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
## 2016-09-13 ENCOUNTER — Encounter (HOSPITAL_COMMUNITY): Payer: Self-pay | Admitting: Emergency Medicine

## 2016-09-13 ENCOUNTER — Emergency Department (HOSPITAL_COMMUNITY)
Admission: EM | Admit: 2016-09-13 | Discharge: 2016-09-15 | Disposition: A | Discharge status: Other Acute Inpatient Hospital | Payer: Medicare Other | Attending: Emergency Medicine | Admitting: Emergency Medicine

## 2016-09-13 DIAGNOSIS — F319 Bipolar disorder, unspecified: Secondary | ICD-10-CM | POA: Diagnosis present

## 2016-09-13 DIAGNOSIS — F312 Bipolar disorder, current episode manic severe with psychotic features: Secondary | ICD-10-CM | POA: Diagnosis present

## 2016-09-13 DIAGNOSIS — Z79899 Other long term (current) drug therapy: Secondary | ICD-10-CM | POA: Insufficient documentation

## 2016-09-13 DIAGNOSIS — Z87891 Personal history of nicotine dependence: Secondary | ICD-10-CM | POA: Insufficient documentation

## 2016-09-13 DIAGNOSIS — F29 Unspecified psychosis not due to a substance or known physiological condition: Secondary | ICD-10-CM | POA: Insufficient documentation

## 2016-09-13 DIAGNOSIS — Z9114 Patient's other noncompliance with medication regimen: Secondary | ICD-10-CM | POA: Insufficient documentation

## 2016-09-13 DIAGNOSIS — F3110 Bipolar disorder, current episode manic without psychotic features, unspecified: Secondary | ICD-10-CM

## 2016-09-13 DIAGNOSIS — E0590 Thyrotoxicosis, unspecified without thyrotoxic crisis or storm: Secondary | ICD-10-CM | POA: Insufficient documentation

## 2016-09-13 LAB — COMPREHENSIVE METABOLIC PANEL
ALT: 24 U/L (ref 14–54)
ANION GAP: 8 (ref 5–15)
AST: 20 U/L (ref 15–41)
Albumin: 3.9 g/dL (ref 3.5–5.0)
Alkaline Phosphatase: 76 U/L (ref 38–126)
BILIRUBIN TOTAL: 0.6 mg/dL (ref 0.3–1.2)
BUN: 12 mg/dL (ref 6–20)
CO2: 24 mmol/L (ref 22–32)
Calcium: 8.9 mg/dL (ref 8.9–10.3)
Chloride: 105 mmol/L (ref 101–111)
Creatinine, Ser: 0.73 mg/dL (ref 0.44–1.00)
GFR calc Af Amer: >60 mL/min (ref >60)
Glucose, Bld: 93 mg/dL (ref 65–99)
POTASSIUM: 3.6 mmol/L (ref 3.5–5.1)
Sodium: 137 mmol/L (ref 135–145)
TOTAL PROTEIN: 7 g/dL (ref 6.5–8.1)

## 2016-09-13 LAB — CBC WITH DIFFERENTIAL/PLATELET
Basophils Absolute: 0 10*3/uL (ref 0.0–0.1)
Basophils Relative: 0 %
EOS PCT: 0 %
Eosinophils Absolute: 0 10*3/uL (ref 0.0–0.7)
HEMATOCRIT: 36.4 % (ref 36.0–46.0)
Hemoglobin: 12.5 g/dL (ref 12.0–15.0)
LYMPHS ABS: 3.8 10*3/uL (ref 0.7–4.0)
LYMPHS PCT: 32 %
MCH: 26.9 pg (ref 26.0–34.0)
MCHC: 34.3 g/dL (ref 30.0–36.0)
MCV: 78.3 fL (ref 78.0–100.0)
Monocytes Absolute: 0.7 10*3/uL (ref 0.1–1.0)
Monocytes Relative: 6 %
Neutro Abs: 7.4 10*3/uL (ref 1.7–7.7)
Neutrophils Relative %: 62 %
Platelets: 265 10*3/uL (ref 150–400)
RBC: 4.65 MIL/uL (ref 3.87–5.11)
RDW: 13.6 % (ref 11.5–15.5)
WBC: 11.9 10*3/uL — AB (ref 4.0–10.5)

## 2016-09-13 LAB — URINALYSIS, ROUTINE W REFLEX MICROSCOPIC
BILIRUBIN URINE: NEGATIVE
Glucose, UA: NEGATIVE mg/dL
HGB URINE DIPSTICK: NEGATIVE
KETONES UR: 20 mg/dL — AB
NITRITE: NEGATIVE
Protein, ur: NEGATIVE mg/dL
SPECIFIC GRAVITY, URINE: 1.012 (ref 1.005–1.030)
pH: 6 (ref 5.0–8.0)

## 2016-09-13 LAB — RAPID URINE DRUG SCREEN, HOSP PERFORMED
Amphetamines: NOT DETECTED
BENZODIAZEPINES: NOT DETECTED
Barbiturates: NOT DETECTED
Cocaine: NOT DETECTED
Opiates: NOT DETECTED
Tetrahydrocannabinol: NOT DETECTED

## 2016-09-13 LAB — SALICYLATE LEVEL

## 2016-09-13 LAB — I-STAT BETA HCG BLOOD, ED (MC, WL, AP ONLY): I-stat hCG, quantitative: <5 m[IU]/mL (ref <5)

## 2016-09-13 LAB — ETHANOL

## 2016-09-13 LAB — TSH

## 2016-09-13 LAB — ACETAMINOPHEN LEVEL: Acetaminophen (Tylenol), Serum: <10 ug/mL — ABNORMAL LOW (ref 10–30)

## 2016-09-13 MED ORDER — ZIPRASIDONE MESYLATE 20 MG IM SOLR
INTRAMUSCULAR | Status: AC
  Filled 2016-09-13: qty 20

## 2016-09-13 MED ORDER — ALUM & MAG HYDROXIDE-SIMETH 200-200-20 MG/5ML PO SUSP: 30.0000 mL | Freq: Four times a day (QID) | ORAL | Status: DC | PRN

## 2016-09-13 MED ORDER — STERILE WATER FOR INJECTION IJ SOLN
INTRAMUSCULAR | Status: AC
  Administered 2016-09-13: 2 mL
  Filled 2016-09-13: qty 10

## 2016-09-13 MED ORDER — TRAZODONE HCL 50 MG PO TABS: 50.0000 mg | ORAL_TABLET | Freq: Every evening | ORAL | Status: DC | PRN

## 2016-09-13 MED ORDER — ROPINIROLE HCL 1 MG PO TABS
1.0000 mg | ORAL_TABLET | Freq: Every evening | ORAL | Status: DC | PRN
  Filled 2016-09-13: qty 1

## 2016-09-13 MED ORDER — ADULT MULTIVITAMIN W/MINERALS CH
1.0000 | ORAL_TABLET | Freq: Every day | ORAL | Status: DC
Start: 2016-09-14 — End: 2016-09-15
  Filled 2016-09-13: qty 1

## 2016-09-13 MED ORDER — IBUPROFEN 200 MG PO TABS: 600.0000 mg | ORAL_TABLET | Freq: Three times a day (TID) | ORAL | Status: DC | PRN

## 2016-09-13 MED ORDER — LAMOTRIGINE 100 MG PO TABS
100.0000 mg | ORAL_TABLET | Freq: Every day | ORAL | Status: DC
  Filled 2016-09-13: qty 1

## 2016-09-13 MED ORDER — ZIPRASIDONE MESYLATE 20 MG IM SOLR
10.0000 mg | Freq: Once | INTRAMUSCULAR | Status: AC
  Administered 2016-09-13: 10 mg via INTRAMUSCULAR

## 2016-09-13 MED ORDER — METHIMAZOLE 10 MG PO TABS
10.0000 mg | ORAL_TABLET | Freq: Two times a day (BID) | ORAL | Status: DC
  Filled 2016-09-13 (×4): qty 1

## 2016-09-13 MED ORDER — ONDANSETRON HCL 4 MG PO TABS: 4.0000 mg | ORAL_TABLET | Freq: Three times a day (TID) | ORAL | Status: DC | PRN

## 2016-09-13 MED ORDER — NICOTINE 21 MG/24HR TD PT24
21.0000 mg | MEDICATED_PATCH | Freq: Every day | TRANSDERMAL | Status: DC
  Filled 2016-09-13: qty 1

## 2016-09-13 MED ORDER — BENZTROPINE MESYLATE 1 MG PO TABS
2.0000 mg | ORAL_TABLET | Freq: Every day | ORAL | Status: DC
  Filled 2016-09-13: qty 2

## 2016-09-13 MED ORDER — RISPERIDONE 2 MG PO TABS
4.0000 mg | ORAL_TABLET | Freq: Every day | ORAL | Status: DC
  Filled 2016-09-13: qty 2

## 2016-09-13 NOTE — ED Notes (Signed)
Bed: RESA Expected date:  Expected time:  Means of arrival:  Comments: Med clearance
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
## 2016-09-13 NOTE — ED Notes (Signed)
Pt. Transferred to [facility] from ED to room 36 after screening for contraband. Report to include Situation, Background, Assessment and Recommendations from RN. Pt. Oriented to unit including Q15 minute rounds as well as the security cameras for their protection. Patient is alert and oriented, warm and dry in no acute distress. Patient denies SI, HI, and AVH. Pt. Encouraged to let me know if needs arise. 

## 2016-09-14 MED ORDER — DIPHENHYDRAMINE HCL 50 MG/ML IJ SOLN
50.0000 mg | Freq: Once | INTRAMUSCULAR | Status: AC
  Administered 2016-09-14: 50 mg via INTRAMUSCULAR
  Filled 2016-09-14: qty 1

## 2016-09-14 MED ORDER — RISPERIDONE MICROSPHERES 25 MG IM SUSR
25.0000 mg | INTRAMUSCULAR | Status: DC
  Administered 2016-09-14: 25 mg via INTRAMUSCULAR
  Filled 2016-09-14: qty 2

## 2016-09-14 MED ORDER — LORAZEPAM 2 MG/ML IJ SOLN
2.0000 mg | Freq: Once | INTRAMUSCULAR | Status: AC
  Administered 2016-09-14: 2 mg via INTRAMUSCULAR
  Filled 2016-09-14: qty 1

## 2016-09-14 NOTE — ED Notes (Signed)
Hourly rounding reveals patient in hall. Mildly agitated but moderately redirectable . Q15 minute rounds and monitoring via [employer] to continue.

## 2016-09-14 NOTE — ED Notes (Signed)
Pt. Pacing in hall, somewhat agitated, c/o inability to sleep but refused all meds earlier tonight.

## 2016-09-14 NOTE — ED Notes (Signed)
Pt. Up to restroom, irritated with other patients TV noise. Pt. Given ear plugs.

## 2016-09-14 NOTE — ED Notes (Signed)
Hourly rounding reveals patient sleeping in room. No complaints, stable, in no acute distress. Q15 minute rounds and monitoring via Security Cameras to continue. 

## 2016-09-14 NOTE — Progress Notes (Signed)
Per psychiatrist Darleene Cleaver and DNP Reita Cliche, patient meets inpatient criteria. CSW faxed patient's referral to: [facility] Ford, [facility], [place], [place], [place], [place], [place] and [place].   Abundio Miu, [place] [facility]  Clinical Social Worker [phone]

## 2016-09-14 NOTE — ED Notes (Signed)
Veronica Perez refused PO medications this a.m. She said that medications are poison, and that she has studied organic chemistry and she knows. She said she needed to protect her family and then spoke harshly about family members. She paced in front of the nurses' station and demanded to leave. Orders were received for IM Ativan, Benadryl, and Risperdal, which were given with assistance from security and [employer]. Pt paced for more than an hour before lying down and sleeping for a few hours. Pt remains safe with 15-minute checks, camera monitoring, and safety checks in place.

## 2016-09-14 NOTE — ED Notes (Signed)
Report to include Situation, Background, Assessment, and Recommendations received from Karen RN. Patient alert and oriented, warm and dry, in no acute distress. Patient denies SI, HI, AVH and pain. Patient made aware of Q15 minute rounds and security cameras for their safety. Patient instructed to come to me with needs or concerns. 

## 2016-09-14 NOTE — ED Notes (Signed)
Hourly rounding reveals patient in room. No complaints, stable, in no acute distress. Q15 minute rounds and monitoring via Security Cameras to continue. 

## 2016-09-14 NOTE — ED Notes (Signed)
Pt. Pacing in her room. Tearfull, refuses all medications, states "nothing wrong with me" "I don't need fucking medication".

## 2016-09-14 NOTE — ED Notes (Signed)
Hourly rounding reveals patient in hall. No complaints, stable, in no acute distress. Q15 minute rounds and monitoring via [employer] to continue. 

## 2016-09-14 NOTE — ED Notes (Signed)
Pt. Pacing in hall and then sits on floor in front of nurses station.

## 2016-09-14 NOTE — ED Notes (Signed)
Hourly rounding reveals patient in hall. No complaints, stable, in no acute distress. Q15 minute rounds and monitoring via [employer] to continue. Snack and beverage given.

## 2016-09-14 NOTE — ED Notes (Signed)
Pt. Assisted to her room from floor in front of nurses station.

## 2016-09-14 NOTE — ED Notes (Signed)
Pt. In hall pacing and then tapping on nurses station glass. Pt. Responding moderately  to redirection.

## 2016-09-14 NOTE — ED Notes (Signed)
Hourly rounding reveals patient in hall. Somewhat labile, stable, in no acute distress. Q15 minute rounds and monitoring via [employer] to continue.

## 2016-09-15 ENCOUNTER — Encounter (HOSPITAL_COMMUNITY): Payer: Self-pay | Admitting: *Deleted

## 2016-09-15 ENCOUNTER — Inpatient Hospital Stay (HOSPITAL_COMMUNITY)
Admission: AD | Admit: 2016-09-15 | Discharge: 2016-09-27 | DRG: 885 | Disposition: A | Discharge status: Home / Self Care | Payer: Medicare Other | Source: Intra-hospital | Attending: Psychiatry | Admitting: Psychiatry

## 2016-09-15 DIAGNOSIS — F312 Bipolar disorder, current episode manic severe with psychotic features: Principal | ICD-10-CM | POA: Diagnosis present

## 2016-09-15 DIAGNOSIS — F4310 Post-traumatic stress disorder, unspecified: Secondary | ICD-10-CM | POA: Diagnosis present

## 2016-09-15 DIAGNOSIS — Z803 Family history of malignant neoplasm of breast: Secondary | ICD-10-CM | POA: Diagnosis not present

## 2016-09-15 DIAGNOSIS — E0590 Thyrotoxicosis, unspecified without thyrotoxic crisis or storm: Secondary | ICD-10-CM | POA: Diagnosis present

## 2016-09-15 DIAGNOSIS — Z9114 Patient's other noncompliance with medication regimen: Secondary | ICD-10-CM

## 2016-09-15 DIAGNOSIS — F3110 Bipolar disorder, current episode manic without psychotic features, unspecified: Secondary | ICD-10-CM

## 2016-09-15 DIAGNOSIS — G2581 Restless legs syndrome: Secondary | ICD-10-CM | POA: Diagnosis present

## 2016-09-15 DIAGNOSIS — Z62810 Personal history of physical and sexual abuse in childhood: Secondary | ICD-10-CM | POA: Diagnosis not present

## 2016-09-15 DIAGNOSIS — F410 Panic disorder [episodic paroxysmal anxiety] without agoraphobia: Secondary | ICD-10-CM | POA: Diagnosis present

## 2016-09-15 DIAGNOSIS — F419 Anxiety disorder, unspecified: Secondary | ICD-10-CM | POA: Diagnosis not present

## 2016-09-15 DIAGNOSIS — Z8249 Family history of ischemic heart disease and other diseases of the circulatory system: Secondary | ICD-10-CM | POA: Diagnosis not present

## 2016-09-15 DIAGNOSIS — F17200 Nicotine dependence, unspecified, uncomplicated: Secondary | ICD-10-CM | POA: Diagnosis present

## 2016-09-15 DIAGNOSIS — R000 Tachycardia, unspecified: Secondary | ICD-10-CM | POA: Diagnosis present

## 2016-09-15 DIAGNOSIS — F39 Unspecified mood [affective] disorder: Secondary | ICD-10-CM | POA: Diagnosis not present

## 2016-09-15 DIAGNOSIS — R292 Abnormal reflex: Secondary | ICD-10-CM | POA: Diagnosis present

## 2016-09-15 DIAGNOSIS — Z87891 Personal history of nicotine dependence: Secondary | ICD-10-CM

## 2016-09-15 DIAGNOSIS — K219 Gastro-esophageal reflux disease without esophagitis: Secondary | ICD-10-CM | POA: Diagnosis present

## 2016-09-15 DIAGNOSIS — F22 Delusional disorders: Secondary | ICD-10-CM | POA: Diagnosis not present

## 2016-09-15 DIAGNOSIS — Z634 Disappearance and death of family member: Secondary | ICD-10-CM | POA: Diagnosis not present

## 2016-09-15 DIAGNOSIS — G4700 Insomnia, unspecified: Secondary | ICD-10-CM | POA: Diagnosis present

## 2016-09-15 DIAGNOSIS — Z833 Family history of diabetes mellitus: Secondary | ICD-10-CM | POA: Diagnosis not present

## 2016-09-15 DIAGNOSIS — Z818 Family history of other mental and behavioral disorders: Secondary | ICD-10-CM

## 2016-09-15 DIAGNOSIS — I10 Essential (primary) hypertension: Secondary | ICD-10-CM | POA: Diagnosis present

## 2016-09-15 DIAGNOSIS — M79642 Pain in left hand: Secondary | ICD-10-CM

## 2016-09-15 DIAGNOSIS — Z91410 Personal history of adult physical and sexual abuse: Secondary | ICD-10-CM | POA: Diagnosis not present

## 2016-09-15 DIAGNOSIS — E079 Disorder of thyroid, unspecified: Secondary | ICD-10-CM | POA: Diagnosis not present

## 2016-09-15 DIAGNOSIS — F332 Major depressive disorder, recurrent severe without psychotic features: Secondary | ICD-10-CM | POA: Diagnosis present

## 2016-09-15 DIAGNOSIS — F29 Unspecified psychosis not due to a substance or known physiological condition: Secondary | ICD-10-CM | POA: Diagnosis not present

## 2016-09-15 DIAGNOSIS — M79641 Pain in right hand: Secondary | ICD-10-CM

## 2016-09-15 DIAGNOSIS — R451 Restlessness and agitation: Secondary | ICD-10-CM | POA: Diagnosis not present

## 2016-09-15 MED ORDER — ADULT MULTIVITAMIN W/MINERALS CH
1.0000 | ORAL_TABLET | Freq: Every day | ORAL | Status: DC
  Administered 2016-09-16 – 2016-09-27 (×6): 1 via ORAL
  Filled 2016-09-15 (×17): qty 1

## 2016-09-15 MED ORDER — ZIPRASIDONE MESYLATE 20 MG IM SOLR
10.0000 mg | Freq: Once | INTRAMUSCULAR | Status: DC | PRN
Start: 2016-09-15 — End: 2016-09-16

## 2016-09-15 MED ORDER — NICOTINE 21 MG/24HR TD PT24
21.0000 mg | MEDICATED_PATCH | Freq: Every day | TRANSDERMAL | Status: DC
  Filled 2016-09-15 (×10): qty 1

## 2016-09-15 MED ORDER — TRAZODONE HCL 50 MG PO TABS: 50.0000 mg | ORAL_TABLET | Freq: Every evening | ORAL | Status: DC | PRN

## 2016-09-15 MED ORDER — ZIPRASIDONE MESYLATE 20 MG IM SOLR
10.0000 mg | Freq: Once | INTRAMUSCULAR | Status: AC
  Administered 2016-09-15: 10 mg via INTRAMUSCULAR
  Filled 2016-09-15: qty 20

## 2016-09-15 MED ORDER — DIPHENHYDRAMINE HCL 50 MG/ML IJ SOLN
25.0000 mg | Freq: Once | INTRAMUSCULAR | Status: AC | PRN
  Administered 2016-09-16: 25 mg via INTRAMUSCULAR
  Filled 2016-09-15: qty 1

## 2016-09-15 MED ORDER — DIPHENHYDRAMINE HCL 50 MG/ML IJ SOLN: 25.0000 mg | Freq: Once | INTRAMUSCULAR | Status: DC

## 2016-09-15 MED ORDER — METHIMAZOLE 10 MG PO TABS
10.0000 mg | ORAL_TABLET | Freq: Two times a day (BID) | ORAL | Status: DC
  Administered 2016-09-17 – 2016-09-27 (×14): 10 mg via ORAL
  Filled 2016-09-15 (×31): qty 1

## 2016-09-15 MED ORDER — ONDANSETRON HCL 4 MG PO TABS: 4.0000 mg | ORAL_TABLET | Freq: Three times a day (TID) | ORAL | Status: DC | PRN

## 2016-09-15 MED ORDER — DIPHENHYDRAMINE HCL 50 MG/ML IJ SOLN
25.0000 mg | Freq: Once | INTRAMUSCULAR | Status: AC
  Administered 2016-09-15: 25 mg via INTRAMUSCULAR
  Filled 2016-09-15: qty 1

## 2016-09-15 MED ORDER — LORAZEPAM 2 MG/ML IJ SOLN: 1.0000 mg | Freq: Once | INTRAMUSCULAR | Status: DC

## 2016-09-15 MED ORDER — MAGNESIUM HYDROXIDE 400 MG/5ML PO SUSP: 30.0000 mL | Freq: Every day | ORAL | Status: DC | PRN

## 2016-09-15 MED ORDER — LORAZEPAM 2 MG/ML IJ SOLN
1.0000 mg | Freq: Once | INTRAMUSCULAR | Status: AC
  Administered 2016-09-15: 1 mg via INTRAVENOUS
  Filled 2016-09-15: qty 1

## 2016-09-15 MED ORDER — LAMOTRIGINE 100 MG PO TABS
100.0000 mg | ORAL_TABLET | Freq: Every day | ORAL | Status: DC
  Filled 2016-09-15 (×3): qty 1

## 2016-09-15 MED ORDER — ACETAMINOPHEN 325 MG PO TABS
650.0000 mg | ORAL_TABLET | Freq: Four times a day (QID) | ORAL | Status: DC | PRN
  Administered 2016-09-18 – 2016-09-27 (×5): 650 mg via ORAL
  Filled 2016-09-15 (×5): qty 2

## 2016-09-15 MED ORDER — LORAZEPAM 2 MG/ML IJ SOLN
1.0000 mg | Freq: Once | INTRAMUSCULAR | Status: AC | PRN
  Administered 2016-09-16: 1 mg via INTRAMUSCULAR
  Filled 2016-09-15: qty 1

## 2016-09-15 MED ORDER — IBUPROFEN 600 MG PO TABS
600.0000 mg | ORAL_TABLET | Freq: Three times a day (TID) | ORAL | Status: DC | PRN
  Administered 2016-09-22 – 2016-09-26 (×5): 600 mg via ORAL
  Filled 2016-09-15 (×5): qty 1

## 2016-09-15 MED ORDER — BENZTROPINE MESYLATE 0.5 MG PO TABS: 0.5000 mg | ORAL_TABLET | Freq: Every day | ORAL | Status: DC

## 2016-09-15 MED ORDER — RISPERIDONE 2 MG PO TABS
4.0000 mg | ORAL_TABLET | Freq: Every day | ORAL | Status: DC
  Filled 2016-09-15 (×3): qty 2

## 2016-09-15 MED ORDER — ZIPRASIDONE MESYLATE 20 MG IM SOLR: 10.0000 mg | Freq: Once | INTRAMUSCULAR | Status: DC

## 2016-09-15 MED ORDER — ALUM & MAG HYDROXIDE-SIMETH 200-200-20 MG/5ML PO SUSP: 30.0000 mL | Freq: Four times a day (QID) | ORAL | Status: DC | PRN

## 2016-09-15 MED ORDER — BENZTROPINE MESYLATE 0.5 MG PO TABS
0.5000 mg | ORAL_TABLET | Freq: Every day | ORAL | Status: DC
  Filled 2016-09-15 (×2): qty 1

## 2016-09-15 MED ORDER — ROPINIROLE HCL 1 MG PO TABS: 1.0000 mg | ORAL_TABLET | Freq: Every evening | ORAL | Status: DC | PRN

## 2016-09-15 NOTE — BH Assessment (Signed)
[place] Assessment Progress Note  Per Corena Pilgrim, MD, this pt requires psychiatric hospitalization.  Leonia Reader, RN, [facility] has pre-assigned pt to [facility] Rm [id number]; she will call when they are ready to receive pt.  Pt presents under IVC initiated by pt's mother, and upheld by Dr Darleene Cleaver, and IVC documents have been faxed to [facility].  Pt's nurse, Nena Jordan, has been notified, and agrees to call report to [phone].  Pt is to be transported via Event organiser.   Jalene Mullet, [place] Triage Specialist [phone]

## 2016-09-15 NOTE — ED Notes (Signed)
Pt.in hall responding to internal stimuli.
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
## 2016-09-15 NOTE — ED Notes (Signed)
Hourly rounding reveals patient in room. No complaints, stable, in no acute distress. Q15 minute rounds and monitoring via Security Cameras to continue. 
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
## 2016-09-15 NOTE — ED Notes (Signed)
Hourly rounding reveals patient pacing in hall. No complaints, stable, in no acute distress. Q15 minute rounds and monitoring via [employer] to continue.

## 2016-09-15 NOTE — Progress Notes (Signed)
Adult Psychoeducational Group Note  Date:  09/15/2016 Time:  8:35 PM  Group Topic/Focus:  Wrap-Up Group:   The focus of this group is to help patients review their daily goal of treatment and discuss progress on daily workbooks.  Participation Level:  Active  Participation Quality:  Appropriate  Affect:  Appropriate  Cognitive:  Appropriate  Insight: Appropriate  Engagement in Group:  Engaged  Modes of Intervention:  Discussion  Additional Comments:  The patient expressed that she attended group.The patient also said that she rates today a 10  Nash Shearer 09/15/2016, 8:35 PM

## 2016-09-15 NOTE — ED Notes (Signed)
Pt. Pacing in the hall chanting "poison in my veins".

## 2016-09-15 NOTE — ED Notes (Signed)
Hourly rounding reveals patient sleeping in room. No complaints, stable, in no acute distress. Q15 minute rounds and monitoring via Security Cameras to continue. 

## 2016-09-15 NOTE — Progress Notes (Signed)
Received report from [facility].  Reported concerns re: patient's refusal of methimazole and patient's TSH 0.010 to Lindell Spar, NP.  Vitals normal except mildly tachycardic.   Per NP still okay to accept patient to [facility] [id number], [facility] RN contacted and requested to send patient.

## 2016-09-15 NOTE — Progress Notes (Signed)
Pt at this time was very, delusional, intrusive, and loud. Speech is pressured, tangential and disorganized-Pt would not stop talking; states, "I love people and that's make me the richest person in the world; I belong to the red people, I look white but I am a red person; I have setup a meeting with Daisy Floro and I will be reporting everyone here to him." Pt refused all medications; "there is nothing wrong with me; I am a red person, I only take herbs and roots." Support, encouragement, and safe environment provided. 15-minute safety checks continue.

## 2016-09-15 NOTE — Social Work (Signed)
Referred to [facility] Team, is [place] resident.  Lucile Hillmann, LCSW Lead Clinical Social Worker Phone:  [phone]  

## 2016-09-15 NOTE — Tx Team (Signed)
Initial Treatment Plan 09/15/2016 10:48 PM [name] [id number]    PATIENT STRESSORS: Marital or family conflict Medication change or noncompliance Other: Psychosis   PATIENT STRENGTHS: Average or above average intelligence Physical Health Supportive family/friends   PATIENT IDENTIFIED PROBLEMS: Psychosis  "Wait til housing becomes available"  "Cope without drugs"                 DISCHARGE CRITERIA:  Ability to meet basic life and health needs Improved stabilization in mood, thinking, and/or behavior Motivation to continue treatment in a less acute level of care Need for constant or close observation no longer present Verbal commitment to aftercare and medication compliance  PRELIMINARY DISCHARGE PLAN: Outpatient therapy Return to previous living arrangement  PATIENT/FAMILY INVOLVEMENT: This treatment plan has been presented to and reviewed with the patient, Veronica Perez.  The patient and family have been given the opportunity to ask questions and make suggestions.  Dustin Flock, RN 09/15/2016, 10:48 PM

## 2016-09-15 NOTE — ED Notes (Signed)
Pt woke up very irritable .  Pt states her brain is hurting because of the poison we gave her.  She is agitated and refusing any medication.  She is extremely paranoid. She is also extremely tearful.  Pt was reassured of her safety.  15 minute checks and video monitoring in place.

## 2016-09-15 NOTE — ED Notes (Signed)
Pt discharged safely with [facility].  Pt was very reluctant to go and very tearful.  Pt went hesitantly without incident.  All belongings were sent with patient.

## 2016-09-16 ENCOUNTER — Encounter (HOSPITAL_COMMUNITY): Payer: Self-pay | Admitting: Psychiatry

## 2016-09-16 DIAGNOSIS — E079 Disorder of thyroid, unspecified: Secondary | ICD-10-CM

## 2016-09-16 DIAGNOSIS — G4700 Insomnia, unspecified: Secondary | ICD-10-CM

## 2016-09-16 DIAGNOSIS — Z818 Family history of other mental and behavioral disorders: Secondary | ICD-10-CM

## 2016-09-16 DIAGNOSIS — F22 Delusional disorders: Secondary | ICD-10-CM

## 2016-09-16 DIAGNOSIS — F410 Panic disorder [episodic paroxysmal anxiety] without agoraphobia: Secondary | ICD-10-CM | POA: Diagnosis present

## 2016-09-16 DIAGNOSIS — F29 Unspecified psychosis not due to a substance or known physiological condition: Secondary | ICD-10-CM

## 2016-09-16 DIAGNOSIS — F312 Bipolar disorder, current episode manic severe with psychotic features: Secondary | ICD-10-CM | POA: Diagnosis present

## 2016-09-16 DIAGNOSIS — Z634 Disappearance and death of family member: Secondary | ICD-10-CM

## 2016-09-16 DIAGNOSIS — Z62810 Personal history of physical and sexual abuse in childhood: Secondary | ICD-10-CM

## 2016-09-16 DIAGNOSIS — F419 Anxiety disorder, unspecified: Secondary | ICD-10-CM

## 2016-09-16 DIAGNOSIS — F4310 Post-traumatic stress disorder, unspecified: Secondary | ICD-10-CM

## 2016-09-16 MED ORDER — LAMOTRIGINE 25 MG PO TABS
25.0000 mg | ORAL_TABLET | Freq: Every day | ORAL | Status: DC
  Administered 2016-09-22: 25 mg via ORAL
  Filled 2016-09-16 (×10): qty 1

## 2016-09-16 MED ORDER — ROPINIROLE HCL 1 MG PO TABS
1.0000 mg | ORAL_TABLET | Freq: Every day | ORAL | Status: DC
  Administered 2016-09-18 (×2): 1 mg via ORAL
  Filled 2016-09-16 (×11): qty 1

## 2016-09-16 MED ORDER — LORAZEPAM 2 MG/ML IJ SOLN
0.5000 mg | Freq: Four times a day (QID) | INTRAMUSCULAR | Status: DC | PRN
  Administered 2016-09-17: 0.5 mg via INTRAMUSCULAR
  Filled 2016-09-16 (×2): qty 1

## 2016-09-16 MED ORDER — ZIPRASIDONE HCL 20 MG PO CAPS
20.0000 mg | ORAL_CAPSULE | Freq: Two times a day (BID) | ORAL | Status: DC | PRN
  Administered 2016-09-16: 20 mg via ORAL
  Filled 2016-09-16: qty 1

## 2016-09-16 MED ORDER — ZIPRASIDONE MESYLATE 20 MG IM SOLR
10.0000 mg | Freq: Two times a day (BID) | INTRAMUSCULAR | Status: DC | PRN
  Administered 2016-09-17: 10 mg via INTRAMUSCULAR
  Filled 2016-09-16 (×2): qty 20

## 2016-09-16 MED ORDER — BENZTROPINE MESYLATE 0.5 MG PO TABS
0.5000 mg | ORAL_TABLET | Freq: Every day | ORAL | Status: DC
Start: 2016-09-17 — End: 2016-09-27
  Administered 2016-09-18: 0.5 mg via ORAL
  Filled 2016-09-16 (×13): qty 1

## 2016-09-16 MED ORDER — LORAZEPAM 0.5 MG PO TABS
0.5000 mg | ORAL_TABLET | Freq: Four times a day (QID) | ORAL | Status: DC | PRN
  Administered 2016-09-18 – 2016-09-27 (×15): 0.5 mg via ORAL
  Filled 2016-09-16 (×16): qty 1

## 2016-09-16 NOTE — [facility] Suicide Risk Assessment (Signed)
[facility] Admission Suicide Risk Assessment   Nursing information obtained from:  Patient Demographic factors:  Caucasian, Unemployed Current Mental Status:  NA Loss Factors:  NA Historical Factors:  NA Risk Reduction Factors:  Responsible for children under 40 years of age, Living with another person, especially a relative, Positive social support  Total Time spent with patient: 30 minutes Principal Problem: Bipolar disorder, current episode manic severe with psychotic features ([place]) Diagnosis:   Patient Active Problem List   Diagnosis Date Noted  . Bipolar disorder, current episode manic severe with psychotic features ([place]) [F31.2] 09/16/2016  . PTSD (post-traumatic stress disorder) [F43.10] 09/16/2016  . Low TSH level [R94.6] 05/30/2016  . Heart palpitations [R00.2] 05/30/2016  . Bipolar I disorder, most recent episode (or current) manic ([place]) [F31.10] 12/22/2012  . 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 PANIC ATTACKS [F41.0] 04/02/2006  . TOBACCO DEPENDENCE [F17.200] 04/02/2006  . RHINITIS, ALLERGIC [J30.9] 04/02/2006  . GASTROESOPHAGEAL REFLUX, NO ESOPHAGITIS [K21.9] 04/02/2006   Subjective Data:Please see H&P.   Continued Clinical Symptoms:  Alcohol Use Disorder Identification Test Final Score (AUDIT): 0 The "Alcohol Use Disorders Identification Test", Guidelines for Use in Primary Care, Second Edition.  World Pharmacologist [facility]). Score between 0-7:  no or low risk or alcohol related problems. Score between 8-15:  moderate risk of alcohol related problems. Score between 16-19:  high risk of alcohol related problems. Score 20 or above:  warrants further diagnostic evaluation for alcohol dependence and treatment.   CLINICAL FACTORS:   Severe Anxiety and/or Agitation Bipolar Disorder:  MANIA Currently Psychotic Unstable or Poor Therapeutic Relationship Previous Psychiatric Diagnoses and Treatments   Musculoskeletal: Strength  & Muscle Tone: within normal limits Gait & Station: normal Patient leans: N/A  Psychiatric Specialty Exam: Physical Exam  Review of Systems  Psychiatric/Behavioral: Positive for depression and hallucinations. The patient is nervous/anxious.   All other systems reviewed and are negative.   Blood pressure (!) 149/74, pulse 99, resp. rate 20, SpO2 99 %.There is no height or weight on file to calculate BMI.              Please see H&P.                                             COGNITIVE FEATURES THAT CONTRIBUTE TO RISK:  Closed-mindedness, Polarized thinking and Thought constriction (tunnel vision)    SUICIDE RISK:   Moderate:  Frequent suicidal ideation with limited intensity, and duration, some specificity in terms of plans, no associated intent, good self-control, limited dysphoria/symptomatology, some risk factors present, and identifiable protective factors, including available and accessible social support.  PLAN OF CARE: Please see H&P.   I certify that inpatient services furnished can reasonably be expected to improve the patient's condition.   Veronica Patella, MD 09/16/2016, 12:05 PM
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
## 2016-09-16 NOTE — Progress Notes (Signed)
Pt ate in cafeteria; behavior was appropriate at meal time.

## 2016-09-16 NOTE — [facility] Group Notes (Signed)
[place] LCSW Group Therapy  09/16/2016 , 2:05 PM   Type of Therapy:  Group Therapy  Participation Level:  Active  Participation Quality:  Attentive  Affect:  Appropriate  Cognitive:  Alert  Insight:  Improving  Engagement in Therapy:  Engaged  Modes of Intervention:  Discussion, Exploration and Socialization  Summary of Progress/Problems: Today's group focused on the term Diagnosis.  Participants were asked to define the term, and then pronounce whether it is a negative, positive or neutral term. Stayed the entire time, engaged throughout.  Her theme throughout was "people forcing me to take medication." Feels it is not helpful and there is no reason for her to take it.  "Other people lie and fabricate to get me into the hospital.  I'm just trying to be a good mother to my daughter." Stated her family is taking care of her daughter while she is in the hospital.  Tangential, somewhat disorganized, lacking insight.  Roque Lias B 09/16/2016 , 2:05 PM

## 2016-09-16 NOTE — Progress Notes (Signed)
During morning medication administration, pt's behavior paranoia/delusion/distruptive advising other patients not to take their meds throughout morning and  refusing to take meds. RNs redirecting and educating pt on medications. Pt noted she would take meds, she placed meds in mouth held them there and would not swallow. Staff encouraged pt to take meds. Pt sat on floor, noted "I will not take this medication." [phone] staff escorted pt to room, IM Benadryl and Ativan administered to left Ventrogluteal. Pt tolerated well. Pt reassures at 0905, she was resting calmly in bed.
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
## 2016-09-16 NOTE — Social Work (Signed)
[facility] accepted [facility] Team, services to begin at discharge.  Edwyna Shell, LCSW Lead Clinical Social Worker Phone:  [phone]

## 2016-09-17 LAB — LIPID PANEL
CHOL/HDL RATIO: 3.5 ratio
CHOLESTEROL: 134 mg/dL (ref 0–200)
HDL: 38 mg/dL — AB (ref >40)
LDL Cholesterol: 78 mg/dL (ref 0–99)
Triglycerides: 92 mg/dL (ref <150)
VLDL: 18 mg/dL (ref 0–40)

## 2016-09-17 LAB — T4, FREE: FREE T4: 2.17 ng/dL — AB (ref 0.61–1.12)

## 2016-09-17 LAB — TSH: TSH: <0.01 u[IU]/mL — ABNORMAL LOW (ref 0.350–4.500)

## 2016-09-17 MED ORDER — PALIPERIDONE ER 3 MG PO TB24
3.0000 mg | ORAL_TABLET | Freq: Every evening | ORAL | Status: DC
  Administered 2016-09-17 – 2016-09-18 (×2): 3 mg via ORAL
  Filled 2016-09-17 (×5): qty 1

## 2016-09-17 MED ORDER — ZIPRASIDONE HCL 20 MG PO CAPS
20.0000 mg | ORAL_CAPSULE | Freq: Every day | ORAL | Status: DC | PRN
  Administered 2016-09-18 – 2016-09-27 (×3): 20 mg via ORAL
  Filled 2016-09-17 (×5): qty 1

## 2016-09-17 MED ORDER — ZIPRASIDONE MESYLATE 20 MG IM SOLR
10.0000 mg | Freq: Every day | INTRAMUSCULAR | Status: DC | PRN
  Filled 2016-09-17 (×2): qty 20

## 2016-09-17 MED ORDER — ZIPRASIDONE MESYLATE 20 MG IM SOLR
10.0000 mg | Freq: Every evening | INTRAMUSCULAR | Status: DC
  Filled 2016-09-17 (×4): qty 20
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
## 2016-09-17 NOTE — BHH Group Notes (Signed)
[facility] LCSW Group Therapy  09/17/2016 3:00 PM   Type of Therapy:  Group Therapy  Participation Level:  Active  Participation Quality:  Attentive  Affect:  Appropriate  Cognitive:  Appropriate  Insight:  Improving  Engagement in Therapy:  Engaged  Modes of Intervention:  Clarification, Education, Exploration and Socialization  Summary of Progress/Problems: Today's group focused on resilience.  Elesa was willing to attend group.  CWS excused her from group due to irritation and angry outburst.    Roque Lias B 09/17/2016 , 3:00 PM

## 2016-09-17 NOTE — Progress Notes (Signed)
Patient noted to be irritable, agitated, and threatening.  Patient unable to be redirected and refused oral prn medications.  A show of support was initiated and patient took IM prn medications without incident.

## 2016-09-17 NOTE — BHH Counselor (Signed)
Unable to conduct PSA due to continuing agitation, accusations, anger.

## 2016-09-17 NOTE — Tx Team (Signed)
Interdisciplinary Treatment and Diagnostic Plan Update  09/17/2016 Time of Session: 9:06 AM  ABRIEL HATTERY MRN: [id number]  Principal Diagnosis: Bipolar disorder, current episode manic severe with psychotic features [facility])  Secondary Diagnoses: Principal Problem:   Bipolar disorder, current episode manic severe with psychotic features ([place]) Active Problems:   PTSD (post-traumatic stress disorder)   Panic disorder   Current Medications:  Current Facility-Administered Medications  Medication Dose Route Frequency Provider Last Rate Last Dose  . acetaminophen (TYLENOL) tablet 650 mg  650 mg Oral Q6H PRN [name], NP      . alum & mag hydroxide-simeth (MAALOX/MYLANTA) 200-200-20 MG/5ML suspension 30 mL  30 mL Oral Q6H PRN [name], NP      . benztropine (COGENTIN) tablet 0.5 mg  0.5 mg Oral QHS [name], [name], MD      . ibuprofen (ADVIL,MOTRIN) tablet 600 mg  600 mg Oral Q8H PRN [name], NP      . lamoTRIgine (LAMICTAL) tablet 25 mg  25 mg Oral Daily [name], [name], MD      . LORazepam (ATIVAN) tablet 0.5 mg  0.5 mg Oral Q6H PRN [name], MD       Or  . LORazepam (ATIVAN) injection 0.5 mg  0.5 mg Intramuscular Q6H PRN [name], MD   0.5 mg at 09/17/16 0737  . magnesium hydroxide (MILK OF MAGNESIA) suspension 30 mL  30 mL Oral Daily PRN [name], NP      . methimazole (TAPAZOLE) tablet 10 mg  10 mg Oral BID [name], NP      . multivitamin with minerals tablet 1 tablet  1 tablet Oral Daily [name], NP   1 tablet at 09/16/16 0830  . nicotine (NICODERM CQ - dosed in mg/24 hours) patch 21 mg  21 mg Transdermal Daily Lord, Jamison Y, NP      . ondansetron [facility]) tablet 4 mg  4 mg Oral Q8H PRN [name], NP      . risperiDONE (RISPERDAL) tablet 4 mg  4 mg Oral QHS Lord, Jamison Y, NP      . rOPINIRole (REQUIP) tablet 1 mg  1 mg Oral QHS [name], [name], MD      . traZODone (DESYREL) tablet 50 mg  50 mg Oral QHS PRN [name], NP      . ziprasidone (GEODON) capsule 20 mg  20 mg Oral BID PRN [name], MD   20 mg at 09/16/16 1533   Or  . ziprasidone (GEODON) injection 10 mg  10 mg Intramuscular BID PRN [name], MD   10 mg at 09/17/16 1660    PTA Medications: Prescriptions Prior to Admission  Medication Sig Dispense Refill Last Dose  . acetaminophen (TYLENOL) 500 MG tablet Take 1,000 mg by mouth every 6 (six) hours as needed for mild pain, moderate pain, fever or headache.    uknown  . benztropine (COGENTIN) 1 MG tablet TAKE 1 TABLET BY MOUTH EVERYDAY AT BEDTIME  0 uknown  . [name] (EUCRISA) 2 % OINT Apply 1 application topically 2 (two) times daily.   uknown  . lamoTRIgine (LAMICTAL) 100 MG tablet Take 100 mg by mouth daily.  0 uknown  . methimazole (TAPAZOLE) 10 MG tablet Take 1 tablet (10 mg total) by mouth 2 (two) times daily. 60 tablet 1 uknown  . Multiple Vitamin (MULTIVITAMIN WITH MINERALS) TABS tablet Take 1 tablet by mouth daily.   09/13/2016 at Unknown time  .  risperidone (RISPERDAL) 4 MG tablet    uknown  . rOPINIRole (REQUIP) 1 MG tablet TAKE 1 TABLET BY MOUTH EVERYDAY AT BEDTIME  0 uknown  . traZODone (DESYREL) 50 MG tablet Take 50 mg by mouth at bedtime as needed for sleep.   uknown    Patient Stressors: Marital or family conflict Medication change or noncompliance Other: Psychosis  Patient Strengths: Average or above average intelligence Physical Health Supportive family/friends  Treatment Modalities: Medication Management, Group therapy, Case management,  1 to 1 session with clinician, Psychoeducation, Recreational therapy.   Physician Treatment Plan for Primary Diagnosis: Bipolar disorder, current episode manic severe with psychotic features ([place]) Long Term Goal(s): Improvement in symptoms so as ready for discharge  Short Term Goals: Ability to verbalize feelings will improve Ability to demonstrate self-control will improve Compliance with prescribed  medications will improve Ability to verbalize feelings will improve Ability to demonstrate self-control will improve Compliance with prescribed medications will improve  Medication Management: Evaluate patient's response, side effects, and tolerance of medication regimen.  Therapeutic Interventions: 1 to 1 sessions, Unit Group sessions and Medication administration.  Evaluation of Outcomes: Not Met  Physician Treatment Plan for Secondary Diagnosis: Principal Problem:   Bipolar disorder, current episode manic severe with psychotic features ([place]) Active Problems:   PTSD (post-traumatic stress disorder)   Panic disorder   Long Term Goal(s): Improvement in symptoms so as ready for discharge  Short Term Goals: Ability to verbalize feelings will improve Ability to demonstrate self-control will improve Compliance with prescribed medications will improve Ability to verbalize feelings will improve Ability to demonstrate self-control will improve Compliance with prescribed medications will improve  Medication Management: Evaluate patient's response, side effects, and tolerance of medication regimen.  Therapeutic Interventions: 1 to 1 sessions, Unit Group sessions and Medication administration.  Evaluation of Outcomes: Not Met   RN Treatment Plan for Primary Diagnosis: Bipolar disorder, current episode manic severe with psychotic features ([place]) Long Term Goal(s): Knowledge of disease and therapeutic regimen to maintain health will improve  Short Term Goals: Ability to identify and develop effective coping behaviors will improve and Compliance with prescribed medications will improve  Medication Management: RN will administer medications as ordered by provider, will assess and evaluate patient's response and provide education to patient for prescribed medication. RN will report any adverse and/or side effects to prescribing provider.  Therapeutic Interventions: 1 on 1 counseling  sessions, Psychoeducation, Medication administration, Evaluate responses to treatment, Monitor vital signs and CBGs as ordered, Perform/monitor CIWA, COWS, AIMS and Fall Risk screenings as ordered, Perform wound care treatments as ordered.  Evaluation of Outcomes: Not Met   LCSW Treatment Plan for Primary Diagnosis: Bipolar disorder, current episode manic severe with psychotic features ([place]) Long Term Goal(s): Safe transition to appropriate next level of care at discharge, Engage patient in therapeutic group addressing interpersonal concerns.  Short Term Goals: Engage patient in aftercare planning with referrals and resources  Therapeutic Interventions: Assess for all discharge needs, 1 to 1 time with Social worker, Explore available resources and support systems, Assess for adequacy in community support network, Educate family and significant other(s) on suicide prevention, Complete Psychosocial Assessment, Interpersonal group therapy.  Evaluation of Outcomes: Met  Return home, follow up with current outpt provider   Progress in Treatment: Attending groups: No Participating in groups: No Taking medication as prescribed: Yes Toleration medication: Yes, no side effects reported at this time Family/Significant other contact made: Let message for mother Patient understands diagnosis: No  Limited insight Discussing patient  identified problems/goals with staff: Yes Medical problems stabilized or resolved: Yes Denies suicidal/homicidal ideation: Yes Issues/concerns per patient self-inventory: None Other: N/A  New problem(s) identified: Pt was seen yesterday for forced medication order.   New Short Term/Long Term Goal(s): "I'm not crazy. My family keeps putting me in the hospital.  They are the ones who are crazy."   Discharge Plan or Barriers:   Reason for Continuation of Hospitalization:  Mania  Medication stabilization   Estimated Length of Stay: 09/22/16  Attendees: Patient:  Jerica Creegan 09/17/2016  9:06 AM  Physician: [name], MD 09/17/2016  9:06 AM  Nursing: Sena Hitch, RN 09/17/2016  9:06 AM  RN Care Manager: Lars Pinks, RN 09/17/2016  9:06 AM  Social Worker: Ripley Fraise 09/17/2016  9:06 AM  Recreational Therapist: Winfield Cunas 09/17/2016  9:06 AM  Other: Norberto Sorenson 09/17/2016  9:06 AM  Other:  09/17/2016  9:06 AM    Scribe for Treatment Team:  Roque Lias LCSW 09/17/2016 9:06 AM

## 2016-09-17 NOTE — Progress Notes (Signed)
Adult Psychoeducational Group Note  Date:  09/17/2016 Time:  9:19 PM  Group Topic/Focus:  Wrap-Up Group:   The focus of this group is to help patients review their daily goal of treatment and discuss progress on daily workbooks.  Participation Level:  Active  Participation Quality:  Intrusive  Affect:  Blunted  Cognitive:  Delusional  Insight: Lacking  Engagement in Group:  Engaged  Modes of Intervention:  Socialization and Support  Additional Comments:  Patient attended and participated in group tonight. She reports that today she had all kinds of emotions. She control her mood by going to her room. She advised that she is not going to put up with being stick or taking pills being put in her body. The doctor told her she is fine and can leave whenever she is ready.  Salley Scarlet [facility] 09/17/2016, 9:19 PM
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
## 2016-09-18 DIAGNOSIS — F39 Unspecified mood [affective] disorder: Secondary | ICD-10-CM

## 2016-09-18 DIAGNOSIS — Z87891 Personal history of nicotine dependence: Secondary | ICD-10-CM

## 2016-09-18 DIAGNOSIS — F312 Bipolar disorder, current episode manic severe with psychotic features: Principal | ICD-10-CM

## 2016-09-18 DIAGNOSIS — F410 Panic disorder [episodic paroxysmal anxiety] without agoraphobia: Secondary | ICD-10-CM

## 2016-09-18 LAB — HEMOGLOBIN A1C
Hgb A1c MFr Bld: 4.9 % (ref 4.8–5.6)
MEAN PLASMA GLUCOSE: 94 mg/dL

## 2016-09-18 LAB — T3: T3 TOTAL: 198 ng/dL — AB (ref 71–180)

## 2016-09-18 LAB — PROLACTIN: PROLACTIN: 69.3 ng/mL — AB (ref 4.8–23.3)

## 2016-09-18 NOTE — BHH Group Notes (Signed)
[facility] Mental Health Association Group Therapy  09/18/2016 , 1:51 PM    Type of Therapy:  Mental Health Association Presentation  Participation Level:  Active  Participation Quality:  Attentive  Affect:  Blunted  Cognitive:  Oriented  Insight:  Limited  Engagement in Therapy:  Engaged  Modes of Intervention:  Discussion, Education and Socialization  Summary of Progress/Problems:  Veronica Perez from [place] came to present his recovery story and play the guitar.  Veronica Perez was loud and angry in the hall prior to group, yelling about getting injections and being poisoned, and insisted on coming to group and darted into the group room.  CSW was eventually able to convince her to come out of the group room.  Veronica Perez 09/18/2016 , 1:51 PM

## 2016-09-18 NOTE — Progress Notes (Signed)
DAR NOTE: Patient continues to be loud, paranoid, intrusive, and irritable on the unit.  Continues to complain about staff injecting her with poison.  Pacing the hallway and telling other patient not to take their medications.  Staff redirected several times but continued to be verbally aggressive towards staff.  Denies pain, auditory and visual hallucinations.  Maintained on routine safety checks.  Medications given as prescribed by forced order.  Support and encouragement offered as needed.  Patient given prn Geodon and Ativan for severe agitation with decreased effect.

## 2016-09-18 NOTE — Progress Notes (Addendum)
[facility] MD Progress Note  09/18/2016 3:55 PM Veronica Perez  MRN:  [id number] Subjective:  Patient reports " I don't like that RN, I like all of the other ones, but that one is trying to poison me". Continues to express reluctance to take psychiatric medications .   Objective:  I have reviewed case with staff and have met with patient. Patient continues to present with paranoid, persecutory ideations. She ruminates about a specific nurse poisoning her food and being " evil", and states that she thinks said RN has put hand sanitizer in her food in an attempt to poison her. Remains disorganized and tangential at times. Initially covering her eyes during session, and when asked shy she states " because justice is blind ". Requesting discharge soon so she can help a friend who wants to quit smoking via natural herbs . Patient has been able to go to some groups, but milieu participation has been limited by ongoing psychotic symptoms as above . Sleep remains poor .  She denies suicidal ideations.  Of note, TSH is suppressed and T3, T4 are elevated . Currently being managed with Tapazole , which she was prescribed prior to admission but was not taking . Her vitals are stable- pulse 92 , EKG is unremarkable     Principal Problem: Bipolar disorder, current episode manic severe with psychotic features [facility]) Diagnosis:   Patient Active Problem List   Diagnosis Date Noted  . Bipolar disorder, current episode manic severe with psychotic features ([place]) [F31.2] 09/16/2016  . PTSD (post-traumatic stress disorder) [F43.10] 09/16/2016  . Panic disorder [F41.0] 09/16/2016  . Low TSH level [R94.6] 05/30/2016  . Heart palpitations [R00.2] 05/30/2016  . Bipolar I disorder, most recent episode (or current) manic ([place]) [F31.10] 12/22/2012  . 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 PANIC ATTACKS [F41.0] 04/02/2006  . TOBACCO DEPENDENCE [F17.200] 04/02/2006  . RHINITIS,  ALLERGIC [J30.9] 04/02/2006  . GASTROESOPHAGEAL REFLUX, NO ESOPHAGITIS [K21.9] 04/02/2006   Total Time spent with patient: 20 minutes  Past Psychiatric History: Please see H&P and above collateral information from mother.   Past Medical History:  Past Medical History:  Diagnosis Date  . Bipolar disorder ([place])   . Depression   . Panic attacks     Past Surgical History:  Procedure Laterality Date  . ADENOIDECTOMY    . OVARIAN CYST SURGERY    . TONSILLECTOMY     Family History:  Family History  Problem Relation Age of Onset  . Diabetes Father   . Heart disease Father   . Cancer Maternal Grandmother        breast cancer  . Bipolar disorder Mother    Family Psychiatric  History: Please see H&P.  Social History: Please see H&P.  History  Alcohol Use No     History  Drug Use No    Social History   Social History  . Marital status: Single    Spouse name: N/A  . Number of children: N/A  . Years of education: N/A   Social History Main Topics  . Smoking status: Former Smoker    Quit date: 01/18/2012  . Smokeless tobacco: Never Used  . Alcohol use No  . Drug use: No  . Sexual activity: Not Asked   Other Topics Concern  . None   Social History Narrative  . None   Additional Social History:   Sleep: Poor  Appetite:  Fair  Current Medications: Current Facility-Administered Medications  Medication Dose  Route Frequency Provider Last Rate Last Dose  . acetaminophen (TYLENOL) tablet 650 mg  650 mg Oral Q6H PRN [name], NP      . alum & mag hydroxide-simeth (MAALOX/MYLANTA) 200-200-20 MG/5ML suspension 30 mL  30 mL Oral Q6H PRN [name], NP      . benztropine (COGENTIN) tablet 0.5 mg  0.5 mg Oral QHS [name], [name], MD      . ibuprofen (ADVIL,MOTRIN) tablet 600 mg  600 mg Oral Q8H PRN [name], NP      . lamoTRIgine (LAMICTAL) tablet 25 mg  25 mg Oral Daily [name], [name], MD      . LORazepam (ATIVAN) tablet 0.5 mg  0.5 mg Oral Q6H PRN  [name], MD   0.5 mg at 09/18/16 1660   Or  . LORazepam (ATIVAN) injection 0.5 mg  0.5 mg Intramuscular Q6H PRN [name], MD   0.5 mg at 09/17/16 0737  . magnesium hydroxide (MILK OF MAGNESIA) suspension 30 mL  30 mL Oral Daily PRN [name], NP      . methimazole (TAPAZOLE) tablet 10 mg  10 mg Oral BID [name], NP   10 mg at 09/17/16 1711  . multivitamin with minerals tablet 1 tablet  1 tablet Oral Daily [name], NP   1 tablet at 09/16/16 0830  . nicotine (NICODERM CQ - dosed in mg/24 hours) patch 21 mg  21 mg Transdermal Daily Lord, Jamison Y, NP      . ondansetron [facility]) tablet 4 mg  4 mg Oral Q8H PRN [name], NP      . paliperidone (INVEGA) 24 hr tablet 3 mg  3 mg Oral QPM [name], [name], MD   3 mg at 09/17/16 1711   Or  . ziprasidone (GEODON) injection 10 mg  10 mg Intramuscular QPM [name], [name], MD      . rOPINIRole (REQUIP) tablet 1 mg  1 mg Oral QHS [name], [name], MD   1 mg at 09/18/16 0312  . traZODone (DESYREL) tablet 50 mg  50 mg Oral QHS PRN [name], NP      . ziprasidone (GEODON) capsule 20 mg  20 mg Oral Daily PRN [name], MD       Or  . ziprasidone (GEODON) injection 10 mg  10 mg Intramuscular Daily PRN [name], MD        Lab Results:  Results for orders placed or performed during the hospital encounter of 09/15/16 (from the past 48 hour(s))  TSH     Status: Abnormal   Collection Time: 09/17/16  6:35 AM  Result Value Ref Range   TSH <0.010 (L) 0.350 - 4.500 uIU/mL    Comment: Performed by a 3rd Generation assay with a functional sensitivity of <=0.01 uIU/mL. Performed at [facility], [place] [address] [place], [place] [address]   T3     Status: Abnormal   Collection Time: 09/17/16  6:35 AM  Result Value Ref Range   T3, Total 198 (H) 71 - 180 ng/dL    Comment: (NOTE) Performed At: [facility] [place], [place] [id number] Lindon Romp MD  [id number] Performed at [facility], [place] [address], [place], [place] [address]   T4, free     Status: Abnormal   Collection Time: 09/17/16  6:35 AM  Result Value Ref Range   Free T4 2.17 (H) 0.61 - 1.12 ng/dL    Comment: (  NOTE) Biotin ingestion may interfere with free T4 tests. If the results are inconsistent with the TSH level, previous test results, or the clinical presentation, then consider biotin interference. If needed, order repeat testing after stopping biotin. Performed at [facility], [place] [address], [place], [place] [address]   Lipid panel     Status: Abnormal   Collection Time: 09/17/16  6:35 AM  Result Value Ref Range   Cholesterol 134 0 - 200 mg/dL   Triglycerides 92 <150 mg/dL   HDL 38 (L) >40 mg/dL   Total CHOL/HDL Ratio 3.5 RATIO   VLDL 18 0 - 40 mg/dL   LDL Cholesterol 78 0 - 99 mg/dL    Comment:        Total Cholesterol/HDL:CHD Risk Coronary Heart Disease Risk Table                     Men   Women  1/2 Average Risk   3.4   3.3  Average Risk       5.0   4.4  2 X Average Risk   9.6   7.1  3 X Average Risk  23.4   11.0        Use the calculated Patient Ratio above and the CHD Risk Table to determine the patient's CHD Risk.        ATP III CLASSIFICATION (LDL):  <100     mg/dL   Optimal  100-129  mg/dL   Near or Above                    Optimal  130-159  mg/dL   Borderline  160-189  mg/dL   High  >190     mg/dL   Very High Performed at [facility] [address] [place], [place] [address]   Hemoglobin A1c     Status: None   Collection Time: 09/17/16  6:35 AM  Result Value Ref Range   Hgb A1c MFr Bld 4.9 4.8 - 5.6 %    Comment: (NOTE)         Prediabetes: 5.7 - 6.4         Diabetes: >6.4         Glycemic control for adults with diabetes: <7.0    Mean Plasma Glucose 94 mg/dL    Comment: (NOTE) Performed At: [facility] [place], [place] [id number] Lindon Romp MD  [id number] Performed at [facility], [place] [address], [place], [place] [address]   Prolactin     Status: Abnormal   Collection Time: 09/17/16  6:35 AM  Result Value Ref Range   Prolactin 69.3 (H) 4.8 - 23.3 ng/mL    Comment: (NOTE) Performed At: [facility] [place], [place] [id number] Lindon Romp MD [id number] Performed at [facility], [place] [address], [place], [place] [address]     Blood Alcohol level:  Lab Results  Component Value Date   [facility] <5 09/13/2016   ETH <5 56/38/9373    Metabolic Disorder Labs: Lab Results  Component Value Date   HGBA1C 4.9 09/17/2016   MPG 94 09/17/2016   Lab Results  Component Value Date   PROLACTIN 69.3 (H) 09/17/2016   Lab Results  Component Value Date   CHOL 134 09/17/2016   TRIG 92 09/17/2016   HDL 38 (L) 09/17/2016   CHOLHDL 3.5 09/17/2016   VLDL 18 09/17/2016   LDLCALC 78 09/17/2016  Physical Findings: AIMS: Facial and Oral Movements Muscles of Facial Expression: None, normal Lips and Perioral Area: None, normal Jaw: None, normal Tongue: None, normal,Extremity Movements Upper (arms, wrists, hands, fingers): None, normal Lower (legs, knees, ankles, toes): None, normal, Trunk Movements Neck, shoulders, hips: None, normal, Overall Severity Severity of abnormal movements (highest score from questions above): None, normal Incapacitation due to abnormal movements: None, normal Patient's awareness of abnormal movements (rate only patient's report): No Awareness, Dental Status Current problems with teeth and/or dentures?: No Does patient usually wear dentures?: No  CIWA:    COWS:     Musculoskeletal: Strength & Muscle Tone: within normal limits Gait & Station: normal Patient leans: N/A  Psychiatric Specialty Exam: Physical Exam  Nursing note and vitals reviewed.   Review of Systems  Psychiatric/Behavioral: The patient is nervous/anxious.   All  other systems reviewed and are negative. denies chest pain, denies shortness of breath, denies vomiting   Blood pressure 138/82, pulse 92, temperature 97.9 F (36.6 C), resp. rate 18, SpO2 99 %.There is no height or weight on file to calculate BMI.  General Appearance: improved grooming compared to admission  Eye Contact:  Fair  Speech:  Normal Rate and not pressured at this time  Volume:  Normal  Mood:  denies feeling depressed   Affect:  Labile, vaguely irritable   Thought Process:  Disorganized and Descriptions of Associations: Tangential  Orientation:  [name], fully alert and attentive  Thought Content:  paranoid ideations, delusions, not internally preoccupied, denies hallucinations   Suicidal Thoughts:  No denies suicidal or self injurious ideations, denies homicidal or violent ideations  Homicidal Thoughts:  No  Memory:  Immediate;   Fair Recent;   Fair Remote;   Fair  Judgement:  Other:  limited   Insight:  limited   Psychomotor Activity:  Restlessness  Concentration:  Concentration: Fair and Attention Span: Fair  Recall:  [employer] of Knowledge:  Fair  Language:  Fair  Akathisia:  No  Handed:  Right  AIMS (if indicated):     Assets:  Desire for Improvement  ADL's:  Intact  Cognition:  WNL  Sleep:  Number of Hours: 0     Treatment Plan Summary: patient remains disorganized in thought process, delusional, expressing persecutory ideations, labile in affect, with poor sleep. Denies SI or HI .  Denies medication side effects, remains reluctant to take medications, and medication over objection second opinion in chart .  Daily contact with patient to assess and evaluate symptoms and progress in treatment, Medication management and Plan see below  Continue  Invega 3 mg PO /Geodon 10 mg IM if she refuses PO for mood sx, psychosis. Plan to give Invega sustenna IM if she tolerates Invega . Continue Lamictal restarted at 25 mg po daily for mood sx. Continue Requip 1 mg po  qhs for RLS. Trazodone 50 mg po qhs prn for insomnia. Continue Tapazole 10 mg po bid for hyperthyroidism  - have reviewed this with hospitalist consultant who recommends to continue current dose and to follow up with endocrinologist after discharge.  Results for BRYN, SALINE (MRN [id number]) as of 09/17/2016 14:51  Ref. Range 09/17/2016 06:35  TSH Latest Ref Range: 0.350 - 4.500 uIU/mL <0.010 (L)  T4,Free(Direct) Latest Ref Range: 0.61 - 1.12 ng/dL 2.17 (H)        Jenne Campus, MD 09/18/2016, 3:55 PM   Patient ID: Veronica Perez, Veronica Perez   DOB: December 07, 1975, 41 y.o.   MRN:  [id number]  

## 2016-09-19 MED ORDER — ZIPRASIDONE MESYLATE 20 MG IM SOLR
10.0000 mg | Freq: Every evening | INTRAMUSCULAR | Status: DC
  Filled 2016-09-19 (×5): qty 20

## 2016-09-19 MED ORDER — PALIPERIDONE ER 6 MG PO TB24
6.0000 mg | ORAL_TABLET | Freq: Every evening | ORAL | Status: DC
  Administered 2016-09-19 – 2016-09-21 (×3): 6 mg via ORAL
  Filled 2016-09-19 (×6): qty 1

## 2016-09-19 NOTE — BHH Counselor (Signed)
Adult Comprehensive Assessment  Patient ID: Veronica Perez, female   DOB: 1975/12/07, 41 y.o.   MRN: [id number]  Information Source: Patient  Current Stressors:  Educational / Learning stressors: N/A Employment / Job issues: Disability Family Relationships: Lives with family Financial / Lack of resources (include bankruptcy): Yes, Fixed Income  Housing / Lack of housing: Lives with family Physical health (include injuries & life threatening diseases): N/A Social relationships: Yes, lack of social relationship except for family  Substance abuse: N/A  Bereavement / Loss: N/A  Living/Environment/Situation:  Living Arrangements: Parent Living conditions (as described by patient or guardian): Good.  Patient states that she is on the wait list for section 8 housing.   How long has patient lived in current situation?: 14 years  What is atmosphere in current home: Comfortable  Family History:  Marital status: Single Does patient have children?: Yes How many children?: 1 How is patient's relationship with their children?: Daughter 14 years - "We have the best relationship. "  Childhood History:  By whom was/is the patient raised?: Both parents Description of patient's relationship with caregiver when they were a child: Did not want disclose.  Patient's description of current relationship with people who raised him/her: "We have a good relationship. I miss my mother."  Does patient have siblings?: Yes Number of Siblings: 2 Description of patient's current relationship with siblings: 1 brother and 1 sister - "That's one thing I don't want to talk about is my sister. She's died." Relationship with brother is fine "We look out for one another."  Did patient suffer any verbal/emotional/physical/sexual abuse as a child?: No Did patient suffer from severe childhood neglect?: No Has patient ever been sexually abused/assaulted/raped as an adolescent or adult?: (Did not want to  disclose. Pt became very agitated. ) Was the patient ever a victim of a crime or a disaster?: (Did not want to disclose. ) Witnessed domestic violence?: (Did not want to disclose) Has patient been effected by domestic violence as an adult?: (Did not want to disclose. )  Education:  Highest grade of school patient has completed: "I don't remember."  Currently a student?: No Learning disability?: Yes What learning problems does patient have?: "There's something there, but I'm not too sure."   Employment/Work Situation:  Employment situation: On disability Why is patient on disability: Bipolar and depression disorders  How long has patient been on disability: "I can't remember, it's been so long."  Patient's job has been impacted by current illness: No What is the longest time patient has a held a job?: "I don't remember."  Where was the patient employed at that time?: "I don't remember."  Has patient ever been in the [employer]?: No Has patient ever served in combat?: No  Financial Resources:  Museum/gallery curator resources: [employer];Food stamps Does patient have a representative payee or guardian?: Yes Name of representative payee or guardian: Mother   Alcohol/Substance Abuse:  What has been your use of drugs/alcohol within the last 12 months?:UDS negative If attempted suicide, did drugs/alcohol play a role in this?: No Alcohol/Substance Abuse Treatment Hx: Denies past history Has alcohol/substance abuse ever caused legal problems?: No  Social Support System: Patient's Community Support System: Good Describe Community Support System: Family  Type of faith/religion: Darrick Meigs  How does patient's faith help to cope with current illness?: Did not want to disclose   Leisure/Recreation:  Leisure and Hobbies: Being with daughter and family   Strengths/Needs:  What things does the patient do well?: Pt became  very tearful "I was fine until  I got on this medication."  In what areas does patient struggle / problems for patient: Depression, DSS, "withdrawing" from medication, and missing family   Discharge Plan:  Does patient have access to transportation?: Yes Will patient be returning to same living situation after discharge?: Yes Currently receiving community mental health services: No  At this point is saying she does not need to follow up anywhere. Does patient have financial barriers related to discharge medications?: No    Summary/Recommendations:   Summary and Recommendations (to be completed by the evaluator): Veronica Perez is a 41 YO Caucasian female diagnosed with Bipolar disorder, current episode manic severe with psychotic features.  She presents under IVC by her mother, and is exhibiting paranoia, mood lability and lack of insight as she externalizes problems and blames others for her hospitalization.  At d/c, Veronica Perez will return home with her family.  At this point, she is declining follow up as "I am not crazy."  In the meantime, she can benefit from crises stabilization, medication management, therapeutic milieu and referral for services.  Trish Mage. 09/19/2016

## 2016-09-19 NOTE — Progress Notes (Signed)
Spoke with Dr. Parke Poisson in regards to low TSH level. Recommended to resume patient's medication Tapazole and follow up with endocrinology on outpt basis.  Leisa Lenz [facility] [phone]

## 2016-09-19 NOTE — Progress Notes (Signed)
[facility] MD Progress Note  09/19/2016 12:50 PM Veronica Perez  MRN:  [id number] Subjective:  Patient reports "I dont like these meds they rev me up. It burns my neck, and gives me a dry mouth. There is nothing wrong with me. My mom and dad sent me here so they can take my SSI check and food stamps. The food is being poisoned and I refuse to eat it. Continues to express reluctance to take psychiatric medications .   Objective:  I have reviewed case with staff and have met with patient. Patient continues to present with paranoid, persecutory ideations. During the evaluaiton she continues to present with a significant amount of paranoia, lability of mood, and some mania. Despite improvement in her orientation and physical appearance, she continues to ruminate about her food being poisoned and her parents taking advantage of her. Requesting discharge soon so she can help her daughter who is being home schooled in the 9th grade. She states that the staff is bullying her and she doesn't need any meds nor does she need to have forced shots and being attacked. . Patient has been able to go to some groups, but milieu participation has been limited by ongoing psychotic symptoms as above .Sleep remains poor. She denies suicidal ideations.  Of note, TSH is suppressed and T3, T4 are elevated . Currently being managed with Tapazole , which she was prescribed prior to admission but was not taking . Her vitals are stable- pulse 92 , EKG is unremarkable   Principal Problem: Bipolar disorder, current episode manic severe with psychotic features [facility]) Diagnosis:   Patient Active Problem List   Diagnosis Date Noted  . Bipolar disorder, current episode manic severe with psychotic features ([place]) [F31.2] 09/16/2016  . PTSD (post-traumatic stress disorder) [F43.10] 09/16/2016  . Panic disorder [F41.0] 09/16/2016  . Low TSH level [R94.6] 05/30/2016  . Heart palpitations [R00.2] 05/30/2016  . Bipolar I disorder, most recent  episode (or current) manic ([place]) [F31.10] 12/22/2012  . 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 PANIC ATTACKS [F41.0] 04/02/2006  . TOBACCO DEPENDENCE [F17.200] 04/02/2006  . RHINITIS, ALLERGIC [J30.9] 04/02/2006  . GASTROESOPHAGEAL REFLUX, NO ESOPHAGITIS [K21.9] 04/02/2006   Total Time spent with patient: 20 minutes  Past Psychiatric History: Please see H&P and above collateral information from mother.   Past Medical History:  Past Medical History:  Diagnosis Date  . Bipolar disorder ([place])   . Depression   . Panic attacks     Past Surgical History:  Procedure Laterality Date  . ADENOIDECTOMY    . OVARIAN CYST SURGERY    . TONSILLECTOMY     Family History:  Family History  Problem Relation Age of Onset  . Diabetes Father   . Heart disease Father   . Cancer Maternal Grandmother        breast cancer  . Bipolar disorder Mother    Family Psychiatric  History: Please see H&P.  Social History: Please see H&P.  History  Alcohol Use No     History  Drug Use No    Social History   Social History  . Marital status: Single    Spouse name: N/A  . Number of children: N/A  . Years of education: N/A   Social History Main Topics  . Smoking status: Former Smoker    Quit date: 01/18/2012  . Smokeless tobacco: Never Used  . Alcohol use No  . Drug use: No  . Sexual activity: Not  Asked   Other Topics Concern  . None   Social History Narrative  . None   Additional Social History:   Sleep: Poor  Appetite:  Fair  Current Medications: Current Facility-Administered Medications  Medication Dose Route Frequency Provider Last Rate Last Dose  . acetaminophen (TYLENOL) tablet 650 mg  650 mg Oral Q6H PRN [name], NP   650 mg at 09/18/16 2224  . alum & mag hydroxide-simeth (MAALOX/MYLANTA) 200-200-20 MG/5ML suspension 30 mL  30 mL Oral Q6H PRN [name], NP      . benztropine (COGENTIN) tablet 0.5 mg  0.5 mg Oral QHS  [name], [name], MD   0.5 mg at 09/18/16 2223  . ibuprofen (ADVIL,MOTRIN) tablet 600 mg  600 mg Oral Q8H PRN [name], NP      . lamoTRIgine (LAMICTAL) tablet 25 mg  25 mg Oral Daily [name], [name], MD      . LORazepam (ATIVAN) tablet 0.5 mg  0.5 mg Oral Q6H PRN [name], MD   0.5 mg at 09/19/16 0806   Or  . LORazepam (ATIVAN) injection 0.5 mg  0.5 mg Intramuscular Q6H PRN [name], MD   0.5 mg at 09/17/16 0737  . magnesium hydroxide (MILK OF MAGNESIA) suspension 30 mL  30 mL Oral Daily PRN [name], NP      . methimazole (TAPAZOLE) tablet 10 mg  10 mg Oral BID [name], NP   10 mg at 09/17/16 1711  . multivitamin with minerals tablet 1 tablet  1 tablet Oral Daily [name], NP   1 tablet at 09/16/16 0830  . nicotine (NICODERM CQ - dosed in mg/24 hours) patch 21 mg  21 mg Transdermal Daily Lord, Jamison Y, NP      . ondansetron [facility]) tablet 4 mg  4 mg Oral Q8H PRN [name], NP      . paliperidone (INVEGA) 24 hr tablet 3 mg  3 mg Oral QPM [name], [name], MD   3 mg at 09/18/16 1854   Or  . ziprasidone (GEODON) injection 10 mg  10 mg Intramuscular QPM [name], [name], MD      . rOPINIRole (REQUIP) tablet 1 mg  1 mg Oral QHS [name], [name], MD   1 mg at 09/18/16 2223  . traZODone (DESYREL) tablet 50 mg  50 mg Oral QHS PRN [name], NP      . ziprasidone (GEODON) capsule 20 mg  20 mg Oral Daily PRN [name], MD   20 mg at 09/18/16 1328   Or  . ziprasidone (GEODON) injection 10 mg  10 mg Intramuscular Daily PRN [name], MD        Lab Results:  No results found for this or any previous visit (from the past 48 hour(s)).  Blood Alcohol level:  Lab Results  Component Value Date   ETH <5 09/13/2016   ETH <5 85/63/1497    Metabolic Disorder Labs: Lab Results  Component Value Date   HGBA1C 4.9 09/17/2016   MPG 94 09/17/2016   Lab Results  Component Value Date   PROLACTIN 69.3 (H) 09/17/2016   Lab Results   Component Value Date   CHOL 134 09/17/2016   TRIG 92 09/17/2016   HDL 38 (L) 09/17/2016   CHOLHDL 3.5 09/17/2016   VLDL 18 09/17/2016   LDLCALC 78 09/17/2016    Physical Findings: AIMS: Facial and Oral Movements Muscles of Facial Expression: None, normal Lips and Perioral Area: None, normal  Jaw: None, normal Tongue: None, normal,Extremity Movements Upper (arms, wrists, hands, fingers): None, normal Lower (legs, knees, ankles, toes): None, normal, Trunk Movements Neck, shoulders, hips: None, normal, Overall Severity Severity of abnormal movements (highest score from questions above): None, normal Incapacitation due to abnormal movements: None, normal Patient's awareness of abnormal movements (rate only patient's report): No Awareness, Dental Status Current problems with teeth and/or dentures?: No Does patient usually wear dentures?: No  CIWA:    COWS:     Musculoskeletal: Strength & Muscle Tone: within normal limits Gait & Station: normal Patient leans: N/A  Psychiatric Specialty Exam: Physical Exam  Nursing note and vitals reviewed.   Review of Systems  Psychiatric/Behavioral: The patient is nervous/anxious.   All other systems reviewed and are negative. denies chest pain, denies shortness of breath, denies vomiting   Blood pressure 129/84, pulse 98, temperature 98.7 F (37.1 C), temperature source Oral, resp. rate 16, SpO2 99 %.There is no height or weight on file to calculate BMI.  General Appearance: improved grooming compared to admission  Eye Contact:  Fair  Speech:  Normal Rate and not pressured at this time  Volume:  Increased  Mood:  denies feeling depressed   Affect:  Labile, vaguely irritable   Thought Process:  Irrelevant, Linear and Descriptions of Associations: Circumstantial  Orientation:  [name], fully alert and attentive  Thought Content:  Delusions, Ideas of Reference:   Paranoia and paranoid ideations, not internally preoccupied, denies  hallucinations   Suicidal Thoughts:  No denies suicidal or self injurious ideations, denies homicidal or violent ideations  Homicidal Thoughts:  No  Memory:  Immediate;   Fair Recent;   Fair Remote;   Fair  Judgement:  Other:  limited   Insight:  limited   Psychomotor Activity:  Restlessness  Concentration:  Concentration: Fair and Attention Span: Fair  Recall:  [employer] of Knowledge:  Fair  Language:  Fair  Akathisia:  No  Handed:  Right  AIMS (if indicated):     Assets:  Desire for Improvement  ADL's:  Intact  Cognition:  WNL  Sleep:  Number of Hours: 4.25     Treatment Plan Summary: patient remains disorganized in thought process, delusional, expressing persecutory ideations, labile in affect, with poor sleep. Denies SI or HI .  Denies medication side effects, remains reluctant to take medications, and medication over objection second opinion in chart .   Daily contact with patient to assess and evaluate symptoms and progress in treatment, Medication management and Plan see below  Increase Invega 6 mg PO /Geodon 10 mg IM if she refuses PO for mood sx, psychosis. Plan to give Invega sustenna IM if she tolerates Invega. Continue Lamictal restarted at 25 mg po daily for mood sx. Continue Requip 1 mg po qhs for RLS. Trazodone 50 mg po qhs prn for insomnia. Continue Tapazole 10 mg po bid for hyperthyroidism  - have reviewed this with hospitalist consultant who recommends to continue current dose and to follow up with endocrinologist after discharge.  Results for JANIENE, AARONS (MRN [id number]) as of 09/17/2016 14:51  Ref. Range 09/17/2016 06:35  TSH Latest Ref Range: 0.350 - 4.500 uIU/mL <0.010 (L)  T4,Free(Direct) Latest Ref Range: 0.61 - 1.12 ng/dL 2.17 (H)     Nanci Pina, FNP 09/19/2016, 12:50 PM

## 2016-09-19 NOTE — BHH Suicide Risk Assessment (Signed)
[place] INPATIENT:  Family/Significant Other Suicide Prevention Education  Suicide Prevention Education:  Education Completed; No one has been identified by the patient as the family member/significant other with whom the patient will be residing, and identified as the person(s) who will aid the patient in the event of a mental health crisis (suicidal ideations/suicide attempt).  With written consent from the patient, the family member/significant other has been provided the following suicide prevention education, prior to the and/or following the discharge of the patient.  The suicide prevention education provided includes the following:  Suicide risk factors  Suicide prevention and interventions  National Suicide Hotline telephone number  [facility] assessment telephone number  [facility] Assistance [place] and/or Residential Mobile Crisis Unit telephone number  Request made of family/significant other to:  Remove weapons (e.g., guns, rifles, knives), all items previously/currently identified as safety concern.    Remove drugs/medications (over-the-counter, prescriptions, illicit drugs), all items previously/currently identified as a safety concern.  The family member/significant other verbalizes understanding of the suicide prevention education information provided.  The family member/significant other agrees to remove the items of safety concern listed above. The patient did not endorse SI at the time of admission, nor did the patient c/o SI during the stay here.  SPE not required.   Trish Mage 09/19/2016, 4:36 PM

## 2016-09-19 NOTE — Plan of Care (Signed)
Problem: Safety: Goal: Periods of time without injury will increase Outcome: Progressing Pt has not harmed self or others tonight.  She denies SI/HI and verbally contracts for safety.    

## 2016-09-19 NOTE — Progress Notes (Addendum)
D: Pt presents with labile affect and mood.  She is loud, paranoid, intrusive, irritable, and demanding.  She states "I can't sleep because of all these meds they're giving me."  Pt is loud while talking on the phone, using profanity towards people on the phone and staff.  She states "I feel like a prisoner in the hospital."  Denies SI/HI, hallucinations, and reports pain from headache of 4/10.  She did not attend evening group.  She is disruptive to milieu.  Pt makes racially focused statements such as "I'm so glad I have a white nurse."    A: Introduced self to pt.  Actively listened to pt and offered support and encouragement.  Medications administered per order.  PRN medication administered for pain and anxiety.  Heat packs and cold packs provided for pain.  Limits set with pt.  Q15 minute safety checks maintained.  R: Pt is hesitant to take medications but was compliant with medications. She verbally contracts for safety.  Will continue to monitor and assess for safety.

## 2016-09-19 NOTE — Progress Notes (Signed)
Patient ID: Veronica Perez, female   DOB: 01-12-1976, 41 y.o.   MRN: [id number] Took pm medications without any objections. Much more pleasant this pm than in the am. She apologized for her behavior is the am, stated it wasn't her, it was the medication making her burn and its too strong for her and she did refuse her thyroid medications this am and pm because she maintained it was too strong. Calmer, not cussing. Talking about what she wants for the future:more children, her own apartment and a man that was good to her. She said she has to take care of her parents, her brother and her 62 yo daughter because they are all ill and disabled except her daughter.

## 2016-09-19 NOTE — Progress Notes (Signed)
Patient ID: Veronica Perez, female   DOB: [birth year], 41 y.o.   MRN: [id number]  D: Patient up pacing in hallway demanding to be discharged. Pt reports she have rights and is held against her will. Pt reports she will be "going to court to fight this injustice". Pt has been verbally aggressive to staff and excessive loud on the phone. Pt has refused scheduled medication. Pt attended evening wrap up group.   A: Support and encouragement offered as needed to express needs.  R: Patient is safe and cooperative on unit. Will continue to monitor  for safety and stability.

## 2016-09-19 NOTE — BHH Group Notes (Signed)
[facility] LCSW Group Therapy  09/19/2016  1:05 PM  Type of Therapy:  Group therapy  Participation Level:  Active  Participation Quality:  Attentive  Affect:  Flat  Cognitive:  Oriented  Insight:  Limited  Engagement in Therapy:  Limited  Modes of Intervention:  Discussion, Socialization  Summary of Progress/Problems:  Chaplain was here to lead a group on themes of hope and courage. "We have salamnders that live in the back yard.  You can pick 'em up and love on 'em, and then put 'em down again."  Talked extensively, on topic, about all her little "critters" at home.  "The outdoors give me hope, and so do  God's critters."  Was able to sit quietly without inter.rupting, was attentive and on topic  Trish Mage 09/19/2016 1:42 PM

## 2016-09-19 NOTE — Progress Notes (Signed)
Patient ID: Veronica Perez, female   DOB: 10-18-1975, 41 y.o.   MRN: [id number] D-Self inventory completed and her stated goal for today is to note take any "dope to sleep becuase in causes insomnia." She is irritable and verbally aggressive with staff and when she uses the phone. She rates herself zeros on how she is feeling out of 10. She is hyperverbal and housekeeper reported to staff she had written on the walls of her bedroom. When staff checked she defended her writing because is she died here she wanted evidence. She discounted her actions, no insight and continued lying in her bed. A-Support offered. Monitored for safety. Refused all medications this am, with discussion she agreed to take her prn Ativan .5 mg with a lot of argument, stating it made her have insomnia and asking writer how much money I was making off of her and the" medications I was forcing down her throat".  R-Ativan has very little impact on her behavior and how talkative she is. Asked NP to review her medications for effectiveness. Needs frequent firm redirection.

## 2016-09-20 NOTE — Progress Notes (Signed)
[facility] Note: Patient presents with irritable affect and mood.  Continues to be delusional, paranoid and guarded.  Patient complain of burning sensation to throat, hot and cold chills, chest discomfort, tachycardia, hyperreflexia and tremors.  Thyroid medication refused after several attempts and encouragement.  MD and RNP aware of symptoms and medication refusal.  Patient is hyper religious and preoccupied with getting discharge.

## 2016-09-20 NOTE — BHH Group Notes (Signed)
LCSW Group Therapy Note  Date/Time:  09/20/2016   11:15PM-12:00PM  Type of Therapy and Topic:  Group Therapy:  Unhealthy/Healthy Coping Skills  Participation Level:  Did Not Attend   Description of Group:  The focus of this group was to discuss some of the prevalent life issues that patients experience, and to identify and write on the white board a group-generated list of unhealthy coping and healthy coping techniques to deal with each issue.    Therapeutic Goals: 1. Patient will be able to distinguish between healthy and unhealthy coping skills 2. Patient will identify and describe 2 life issues they experience 3. Patient will identify one positive coping strategy for each life issue they experience 4. Patient will respond empathetically to peers' statements regarding life issues they experience  Summary of Patient Progress: N/A  Therapeutic Modalities Cognitive Behavioral Therapy Motivational Interviewing  Selmer Dominion, LCSW

## 2016-09-20 NOTE — ED Triage Notes (Signed)
Pt sent over from [facility]: Spoke with Joann BH AC, Pt started c/o palpitations, shakiness, increased agitation and tingling in hands. Pt tearful and anxious, unable to get information from patient. Per chart, pt was hospitalized at [facility] on 8/15, has not been taking thyroid medications since before admission. Pt is also IVC per chart

## 2016-09-20 NOTE — Progress Notes (Signed)
[facility] MD Progress Note  09/20/2016 2:50 PM Veronica Perez  MRN:  [id number] Subjective:  Patient reports "I dont want to take my medicine. I have two bottles at home so just let me go home and take my medicine.   Per nursing:  Patient up pacing in hallway demanding to be discharged. Pt reports she have rights and is held against her will. Pt reports she will be "going to court to fight this injustice". Pt has been verbally aggressive to staff and excessive loud on the phone. Pt has refused scheduled medication. Pt attended evening wrap up group.    Objective:  I have reviewed case with staff and have met with patient. Patient continues to present with paranoid, persecutory ideations. At this time it appears that patient is regressing due to paranoia and hyperthyoid symptoms. Upon observation she is observed rocking, tremulous, LLE psychomotor agitation. With much encouragement and reassurance she continued to refuse her methimazole at this time, with increase in tremors and hyperreflexia.  During the evaluaiton she continues to present with a significant amount of paranoia, lability of mood, and some mania. She continues to ruminate about discharging. Unable to properly assess her mental health at this time due to tangentiality and mania.  Patient has been able to go to some groups, but milieu participation has been limited by ongoing psychotic symptoms as above .Sleep remains poor. She denies suicidal ideations.   Of note, TSH is suppressed and T3, T4 are elevated . Currently being managed with Tapazole , which she was prescribed prior to admission but was not taking. EKG is unremarkable   Principal Problem: Bipolar disorder, current episode manic severe with psychotic features [facility]) Diagnosis:   Patient Active Problem List   Diagnosis Date Noted  . Bipolar disorder, current episode manic severe with psychotic features ([place]) [F31.2] 09/16/2016  . PTSD (post-traumatic stress disorder) [F43.10]  09/16/2016  . Panic disorder [F41.0] 09/16/2016  . Low TSH level [R94.6] 05/30/2016  . Heart palpitations [R00.2] 05/30/2016  . Bipolar I disorder, most recent episode (or current) manic ([place]) [F31.10] 12/22/2012  . 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 PANIC ATTACKS [F41.0] 04/02/2006  . TOBACCO DEPENDENCE [F17.200] 04/02/2006  . RHINITIS, ALLERGIC [J30.9] 04/02/2006  . GASTROESOPHAGEAL REFLUX, NO ESOPHAGITIS [K21.9] 04/02/2006   Total Time spent with patient: 20 minutes  Past Psychiatric History: Please see H&P and above collateral information from mother.   Past Medical History:  Past Medical History:  Diagnosis Date  . Bipolar disorder ([place])   . Depression   . Panic attacks     Past Surgical History:  Procedure Laterality Date  . ADENOIDECTOMY    . OVARIAN CYST SURGERY    . TONSILLECTOMY     Family History:  Family History  Problem Relation Age of Onset  . Diabetes Father   . Heart disease Father   . Cancer Maternal Grandmother        breast cancer  . Bipolar disorder Mother    Family Psychiatric  History: Please see H&P.  Social History: Please see H&P.  History  Alcohol Use No     History  Drug Use No    Social History   Social History  . Marital status: Single    Spouse name: N/A  . Number of children: N/A  . Years of education: N/A   Social History Main Topics  . Smoking status: Former Smoker    Quit date: 01/18/2012  . Smokeless tobacco: Never  Used  . Alcohol use No  . Drug use: No  . Sexual activity: Not Asked   Other Topics Concern  . None   Social History Narrative  . None   Additional Social History:   Sleep: Poor  Appetite:  Fair  Current Medications: Current Facility-Administered Medications  Medication Dose Route Frequency Provider Last Rate Last Dose  . acetaminophen (TYLENOL) tablet 650 mg  650 mg Oral Q6H PRN [name], NP   650 mg at 09/19/16 2320  . alum & mag  hydroxide-simeth (MAALOX/MYLANTA) 200-200-20 MG/5ML suspension 30 mL  30 mL Oral Q6H PRN [name], NP      . benztropine (COGENTIN) tablet 0.5 mg  0.5 mg Oral QHS [name], [name], MD   0.5 mg at 09/18/16 2223  . ibuprofen (ADVIL,MOTRIN) tablet 600 mg  600 mg Oral Q8H PRN [name], NP      . lamoTRIgine (LAMICTAL) tablet 25 mg  25 mg Oral Daily [name], [name], MD      . LORazepam (ATIVAN) tablet 0.5 mg  0.5 mg Oral Q6H PRN [name], MD   0.5 mg at 09/20/16 0045   Or  . LORazepam (ATIVAN) injection 0.5 mg  0.5 mg Intramuscular Q6H PRN [name], MD   0.5 mg at 09/17/16 0737  . magnesium hydroxide (MILK OF MAGNESIA) suspension 30 mL  30 mL Oral Daily PRN [name], NP      . methimazole (TAPAZOLE) tablet 10 mg  10 mg Oral BID [name], NP   10 mg at 09/17/16 1711  . multivitamin with minerals tablet 1 tablet  1 tablet Oral Daily [name], NP   1 tablet at 09/16/16 0830  . nicotine (NICODERM CQ - dosed in mg/24 hours) patch 21 mg  21 mg Transdermal Daily Lord, Jamison Y, NP      . ondansetron [facility]) tablet 4 mg  4 mg Oral Q8H PRN [name], NP      . paliperidone (INVEGA) 24 hr tablet 6 mg  6 mg Oral QPM Nanci Pina, FNP   6 mg at 09/19/16 1817   Or  . ziprasidone (GEODON) injection 10 mg  10 mg Intramuscular QPM Starkes, Ebone Alcivar S, FNP      . rOPINIRole (REQUIP) tablet 1 mg  1 mg Oral QHS [name], [name], MD   1 mg at 09/18/16 2223  . traZODone (DESYREL) tablet 50 mg  50 mg Oral QHS PRN [name], NP      . ziprasidone (GEODON) capsule 20 mg  20 mg Oral Daily PRN [name], MD   20 mg at 09/18/16 1328   Or  . ziprasidone (GEODON) injection 10 mg  10 mg Intramuscular Daily PRN [name], MD        Lab Results:  No results found for this or any previous visit (from the past 48 hour(s)).  Blood Alcohol level:  Lab Results  Component Value Date   ETH <5 09/13/2016   ETH <5 65/04/5463    Metabolic Disorder Labs: Lab  Results  Component Value Date   HGBA1C 4.9 09/17/2016   MPG 94 09/17/2016   Lab Results  Component Value Date   PROLACTIN 69.3 (H) 09/17/2016   Lab Results  Component Value Date   CHOL 134 09/17/2016   TRIG 92 09/17/2016   HDL 38 (L) 09/17/2016   CHOLHDL 3.5 09/17/2016   VLDL 18 09/17/2016   LDLCALC 78 09/17/2016    Physical  Findings: AIMS: Facial and Oral Movements Muscles of Facial Expression: None, normal Lips and Perioral Area: None, normal Jaw: None, normal Tongue: None, normal,Extremity Movements Upper (arms, wrists, hands, fingers): None, normal Lower (legs, knees, ankles, toes): None, normal, Trunk Movements Neck, shoulders, hips: None, normal, Overall Severity Severity of abnormal movements (highest score from questions above): None, normal Incapacitation due to abnormal movements: None, normal Patient's awareness of abnormal movements (rate only patient's report): No Awareness, Dental Status Current problems with teeth and/or dentures?: No Does patient usually wear dentures?: No  CIWA:    COWS:     Musculoskeletal: Strength & Muscle Tone: within normal limits Gait & Station: normal Patient leans: N/A  Psychiatric Specialty Exam: Physical Exam  Nursing note and vitals reviewed.   Review of Systems  Psychiatric/Behavioral: The patient is nervous/anxious.   All other systems reviewed and are negative. denies chest pain, denies shortness of breath, denies vomiting   Blood pressure 129/84, pulse 98, temperature 98.7 F (37.1 C), temperature source Oral, resp. rate 16, SpO2 99 %.There is no height or weight on file to calculate BMI.  General Appearance: improved grooming compared to admission  Eye Contact:  Fair  Speech:  Normal Rate and Pressured  Volume:  Increased  Mood:  Dysphoric and Irritable  Affect:  Labile, vaguely irritable   Thought Process:  Irrelevant, Linear and Descriptions of Associations: Circumstantial  Orientation:  [name], fully  alert and attentive  Thought Content:  Delusions, Ideas of Reference:   Paranoia and paranoid ideations, not internally preoccupied, denies hallucinations   Suicidal Thoughts:  No denies suicidal or self injurious ideations, denies homicidal or violent ideations  Homicidal Thoughts:  No  Memory:  Immediate;   Fair Recent;   Fair Remote;   Fair  Judgement:  Other:  limited   Insight:  limited   Psychomotor Activity:  Increased, Restlessness and Tremor  Concentration:  Concentration: Fair and Attention Span: Fair  Recall:  [employer] of Knowledge:  Fair  Language:  Fair  Akathisia:  No  Handed:  Right  AIMS (if indicated):     Assets:  Desire for Improvement  ADL's:  Intact  Cognition:  WNL  Sleep:  Number of Hours: 3     Treatment Plan Summary: patient remains disorganized in thought process, delusional, expressing persecutory ideations, labile in affect, with poor sleep. Denies SI or HI .  Denies medication side effects, remains reluctant to take medications, and medication over objection second opinion in chart .   Daily contact with patient to assess and evaluate symptoms and progress in treatment, Medication management and Plan see below  Increase Invega 6 mg PO /Geodon 10 mg IM if she refuses PO for mood sx, psychosis. Plan to give Invega sustenna IM if she tolerates Invega. Continue Lamictal restarted at 25 mg po daily for mood sx. Continue Requip 1 mg po qhs for RLS. Trazodone 50 mg po qhs prn for insomnia. Continue Tapazole 10 mg po bid for hyperthyroidism, patient is not taking this medication at this time. WIll continue to closely monitor due to hyperreflexia,tachycardia, and tremuluous will transfer to the ED for further evaluation at this time. - have reviewed this with hospitalist consultant who recommends to continue current dose and to follow up with endocrinologist after discharge. Will increase vital signs to BID at this time.   Results for YASMIN, BRONAUGH (MRN  [id number]) as of 09/17/2016 14:51  Ref. Range 09/17/2016 06:35  TSH Latest Ref Range: 0.350 -  4.500 uIU/mL <0.010 (L)  T4,Free(Direct) Latest Ref Range: 0.61 - 1.12 ng/dL 2.17 (H)     Nanci Pina, FNP 09/20/2016, 2:50 PM

## 2016-09-20 NOTE — Progress Notes (Signed)
Writer received report from previous nurse [facility] RN. Reported that patient has been complaining of throat burning, fever and chills, tachycardia and NP documented earlier today some hyperflexia noted upon her assessment. Patient has been refusing all medications since admission. Vitals were taken before pelham was called at 2035 sitting 152/83 pulse 111, temp. 99.8 and respirations 18. Standing bp 142/88, pulse 114. Since shift change patient has complained of her heart racing. She continues to refuse medications. Labs show she has elevated T3 and T4 and thyroid significantly low. She has not taken any thyroid medication since admission also. Patient informed of transport and is agreeable. Safety maintained with Mht accompanying on transport.

## 2016-09-21 MED ORDER — LUBRIDERM SERIOUSLY SENSITIVE EX LOTN
TOPICAL_LOTION | Freq: Once | CUTANEOUS | Status: AC
Start: 2016-09-21 — End: 2016-09-21
  Administered 2016-09-21: 1 via TOPICAL
  Filled 2016-09-21 (×2): qty 562

## 2016-09-21 MED ORDER — PALIPERIDONE PALMITATE 234 MG/1.5ML IM SUSP
234.0000 mg | Freq: Once | INTRAMUSCULAR | Status: AC
  Administered 2016-09-21: 234 mg via INTRAMUSCULAR
  Filled 2016-09-21: qty 1.5

## 2016-09-21 MED ORDER — PALIPERIDONE PALMITATE 156 MG/ML IM SUSP: 156.0000 mg | Freq: Once | INTRAMUSCULAR | Status: DC

## 2016-09-21 MED ORDER — PALIPERIDONE PALMITATE 117 MG/0.75ML IM SUSP: 117.0000 mg | INTRAMUSCULAR | Status: DC

## 2016-09-21 NOTE — BHH Group Notes (Signed)
[facility] LCSW Group Therapy Note  Date/Time:  09/21/2016  11:00AM-12:00PM  Type of Therapy and Topic:  Group Therapy:  Music and Mood  Participation Level:  Did Not Attend   Description of Group: In this process group, members listened to a variety of genres of music and identified that different types of music evoke different responses.  Patients were encouraged to identify music that was soothing for them and music that was energizing for them.  Patients discussed how this knowledge can help with wellness and recovery in various ways including managing depression and anxiety as well as encouraging healthy sleep habits.    Therapeutic Goals: 1. Patients will explore the impact of different varieties of music on mood 2. Patients will verbalize the thoughts they have when listening to different types of music 3. Patients will identify music that is soothing to them as well as music that is energizing to them 4. Patients will discuss how to use this knowledge to assist in maintaining wellness and recovery 5. Patients will explore the use of music as a coping skill  Summary of Patient Progress:  N/A  Therapeutic Modalities: Solution Focused Brief Therapy Motivational Interviewing Activity   Selmer Dominion, LCSW 09/21/2016 12:24 PM

## 2016-09-21 NOTE — Progress Notes (Signed)
Adult Psychoeducational Group Note  Date:  09/21/2016 Time:  9:37 PM  Group Topic/Focus:  Wrap-Up Group:   The focus of this group is to help patients review their daily goal of treatment and discuss progress on daily workbooks.  Participation Level:  Active  Participation Quality:  Intrusive  Affect:  Labile  Cognitive:  Confused  Insight: Limited  Engagement in Group:  Engaged  Modes of Intervention:  Socialization and Support  Additional Comments:  Patient attended and participated in group tonight. She reports feeling better today. She relaxed and chilled in her room to calm down. She read some and socialized. She spoke with the mother and father on the phone.  Salley [facility]let [facility] 09/21/2016, 9:37 PM

## 2016-09-21 NOTE — ED Provider Notes (Signed)
[place] DEPT Provider Note   CSN: [id number] Arrival date & time: 09/20/16  2115     History   Chief Complaint Chief Complaint  Patient presents with  . Palpitations  . Medical Clearance    HPI Veronica Perez is a 41 y.o. female.  41 yo F with a chief complaint of palpitations and burning to bilateral hands. This is an ongoing issue for her. The patient has a known diagnosis of hyperthyroidism and has been refusing to take her medications. She thinks it makes her feel weird. She does feel that it does make her hand symptoms worse when she doesn't take it. She is currently in behavioral health for decompensated bipolar disorder for which she has been refusing to take her medications. She also complains of bilateral hand burning. States it's across her palms but worse in the first through third digit of bilateral hands.   The history is provided by the patient.  Palpitations   This is a chronic problem. The current episode started more than 1 week ago. The problem occurs constantly. The problem has not changed since onset.Pertinent negatives include no fever, no chest pain, no nausea, no vomiting, no headaches, no dizziness and no shortness of breath. She has tried nothing for the symptoms.    Past Medical History:  Diagnosis Date  . Bipolar disorder ([place])   . Depression   . Panic attacks     Patient Active Problem List   Diagnosis Date Noted  . Bipolar disorder, current episode manic severe with psychotic features ([place]) 09/16/2016  . PTSD (post-traumatic stress disorder) 09/16/2016  . Panic disorder 09/16/2016  . Low TSH level 05/30/2016  . Heart palpitations 05/30/2016  . Bipolar I disorder, most recent episode (or current) manic ([place]) 12/22/2012  . 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 PANIC ATTACKS 04/02/2006  . TOBACCO DEPENDENCE 04/02/2006  . RHINITIS, ALLERGIC 04/02/2006  . GASTROESOPHAGEAL REFLUX, NO ESOPHAGITIS  04/02/2006    Past Surgical History:  Procedure Laterality Date  . ADENOIDECTOMY    . OVARIAN CYST SURGERY    . TONSILLECTOMY      OB History    No data available       Home Medications    Prior to Admission medications   Medication Sig Start Date End Date Taking? Authorizing Provider  acetaminophen (TYLENOL) 500 MG tablet Take 1,000 mg by mouth every 6 (six) hours as needed for mild pain, moderate pain, fever or headache.     [provider]  benztropine (COGENTIN) 1 MG tablet TAKE 1 TABLET BY MOUTH EVERYDAY AT BEDTIME 08/21/16   [provider]  [name] (EUCRISA) 2 % OINT Apply 1 application topically 2 (two) times daily.    [provider]  lamoTRIgine (LAMICTAL) 100 MG tablet Take 100 mg by mouth daily. 08/21/16   [provider]  methimazole (TAPAZOLE) 10 MG tablet Take 1 tablet (10 mg total) by mouth 2 (two) times daily. 09/09/16   Nche, Charlene Brooke, NP  Multiple Vitamin (MULTIVITAMIN WITH MINERALS) TABS tablet Take 1 tablet by mouth daily.    [provider]  risperidone (RISPERDAL) 4 MG tablet  09/03/16   [provider]  rOPINIRole (REQUIP) 1 MG tablet TAKE 1 TABLET BY MOUTH EVERYDAY AT BEDTIME 08/21/16   [provider]  traZODone (DESYREL) 50 MG tablet Take 50 mg by mouth at bedtime as needed for sleep. 09/03/16   [provider]    Family History Family History  Problem Relation Age of Onset  . Diabetes Father   . Heart disease Father   . Cancer Maternal Grandmother        breast cancer  . Bipolar disorder Mother     Social History Social History  Substance Use Topics  . Smoking status: Former Smoker    Quit date: 01/18/2012  . Smokeless tobacco: Never Used  . Alcohol use No     Allergies   Penicillins   Review of Systems Review of Systems  Constitutional: Negative for chills and fever.  HENT: Negative for congestion and rhinorrhea.   Eyes: Negative for redness and visual  disturbance.  Respiratory: Negative for shortness of breath and wheezing.   Cardiovascular: Positive for palpitations. Negative for chest pain.  Gastrointestinal: Negative for nausea and vomiting.  Genitourinary: Negative for dysuria and urgency.  Musculoskeletal: Positive for arthralgias and myalgias.  Skin: Negative for pallor and wound.  Neurological: Negative for dizziness and headaches.     Physical Exam Updated Vital Signs BP 129/76   Pulse (!) 101   Temp 98.3 F (36.8 C) (Oral)   Resp 16   SpO2 99%   Physical Exam  Constitutional: She is oriented to person, place, and time. She appears well-developed and well-nourished. No distress.  HENT:  Head: Normocephalic and atraumatic.  Eyes: Pupils are equal, round, and reactive to light. EOM are normal.  Neck: Normal range of motion. Neck supple.  Cardiovascular: Normal rate and regular rhythm.  Exam reveals no gallop and no friction rub.   No murmur heard. Pulmonary/Chest: Effort normal. She has no wheezes. She has no rales.  Abdominal: Soft. She exhibits no distension and no mass. There is no tenderness. There is no guarding.  Musculoskeletal: She exhibits no edema or tenderness.  Mild increase of hand symptoms with Tinel's  Neurological: She is alert and oriented to person, place, and time.  Skin: Skin is warm and dry. She is not diaphoretic.  Psychiatric: She has a normal mood and affect. Her behavior is normal.  Nursing note and vitals reviewed.    ED Treatments / Results  Labs (all labs ordered are listed, but only abnormal results are displayed) Labs Reviewed  TSH - Abnormal; Notable for the following:       Result Value   TSH <0.010 (*)    All other components within normal limits  T3 - Abnormal; Notable for the following:    T3, Total 198 (*)    All other components within normal limits  T4, FREE - Abnormal; Notable for the following:    Free T4 2.17 (*)    All other components within normal limits  LIPID  PANEL - Abnormal; Notable for the following:    HDL 38 (*)    All other components within normal limits  PROLACTIN - Abnormal; Notable for the following:    Prolactin 69.3 (*)    All other components within normal limits  HEMOGLOBIN A1C    EKG  EKG Interpretation None       Radiology No results found.  Procedures Procedures (including critical care time)  Medications Ordered in ED Medications  acetaminophen (TYLENOL) tablet 650 mg (650 mg Oral Given 09/19/16 2320)  magnesium hydroxide (MILK OF MAGNESIA) suspension 30 mL (not administered)  alum & mag hydroxide-simeth (MAALOX/MYLANTA) 200-200-20 MG/5ML suspension 30 mL (not administered)  ibuprofen (ADVIL,MOTRIN) tablet 600 mg (not administered)  nicotine (NICODERM CQ - dosed in mg/24 hours) patch 21 mg (21 mg Transdermal Not Given 09/20/16 0800)  ondansetron (ZOFRAN) tablet 4 mg (not administered)  methimazole (TAPAZOLE) tablet 10 mg (10 mg Oral Not Given 09/20/16 1700)  multivitamin with minerals tablet 1 tablet (1 tablet Oral Not Given 09/20/16 0800)  traZODone (DESYREL) tablet 50 mg (not administered)  benztropine (COGENTIN) tablet 0.5 mg (0.5 mg Oral Not Given 09/20/16 2200)  rOPINIRole (REQUIP) tablet 1 mg (1 mg Oral Not Given 09/20/16 2200)  lamoTRIgine (LAMICTAL) tablet 25 mg (25 mg Oral Not Given 09/20/16 0800)  LORazepam (ATIVAN) tablet 0.5 mg (0.5 mg Oral Given 09/20/16 0045)    Or  LORazepam (ATIVAN) injection 0.5 mg ( Intramuscular See Alternative 09/20/16 0045)  ziprasidone (GEODON) capsule 20 mg (20 mg Oral Given 09/18/16 1328)    Or  ziprasidone (GEODON) injection 10 mg ( Intramuscular See Alternative 09/18/16 1328)  paliperidone (INVEGA) 24 hr tablet 6 mg (6 mg Oral Given 09/20/16 1759)    Or  ziprasidone (GEODON) injection 10 mg ( Intramuscular See Alternative 09/20/16 1759)  LORazepam (ATIVAN) injection 1 mg (1 mg Intramuscular Given 09/16/16 0848)  diphenhydrAMINE (BENADRYL) injection 25 mg (25 mg Intramuscular  Given 09/16/16 0848)     Initial Impression / Assessment and Plan / ED Course  I have reviewed the triage vital signs and the nursing notes.  Pertinent labs & imaging results that were available during my care of the patient were reviewed by me and considered in my medical decision making (see chart for details).     41 yo F with a chief complaints of hyperthyroidism. She has no signs of heart failure or heart rate is normal her T4 and T3 are mildly elevated. I discussed with her that she likely needs to go back on her medication. She does not like the side effects of it I suggested that she follow-up with her family physician or an endocrinologist for possible alternative therapy. She is also complaining of bilateral hand burning. This is possibly carpal tunnel syndrome. Will give removable splints to wear at night.  2:00 AM:  I have discussed the diagnosis/risks/treatment options with the patient and believe the pt to be eligible for discharge home to follow-up with PCP. We also discussed returning to the ED immediately if new or worsening sx occur. We discussed the sx which are most concerning (e.g., sudden worsening pain, fever, inability to tolerate by mouth) that necessitate immediate return. Medications administered to the patient during their visit and any new prescriptions provided to the patient are listed below.  Medications given during this visit Medications  acetaminophen (TYLENOL) tablet 650 mg (650 mg Oral Given 09/19/16 2320)  magnesium hydroxide (MILK OF MAGNESIA) suspension 30 mL (not administered)  alum & mag hydroxide-simeth (MAALOX/MYLANTA) 200-200-20 MG/5ML suspension 30 mL (not administered)  ibuprofen (ADVIL,MOTRIN) tablet 600 mg (not administered)  nicotine (NICODERM CQ - dosed in mg/24 hours) patch 21 mg (21 mg Transdermal Not Given 09/20/16 0800)  ondansetron (ZOFRAN) tablet 4 mg (not administered)  methimazole (TAPAZOLE) tablet 10 mg (10 mg Oral Not Given 09/20/16  1700)  multivitamin with minerals tablet 1 tablet (1 tablet Oral Not Given 09/20/16 0800)  traZODone (DESYREL) tablet 50 mg (not administered)  benztropine (COGENTIN) tablet 0.5 mg (0.5 mg Oral Not Given 09/20/16 2200)  rOPINIRole (REQUIP) tablet 1 mg (1 mg Oral Not Given 09/20/16 2200)  lamoTRIgine (LAMICTAL) tablet 25 mg (25 mg Oral Not Given 09/20/16 0800)  LORazepam (ATIVAN) tablet 0.5 mg (0.5 mg Oral Given 09/20/16 0045)    Or  LORazepam (ATIVAN) injection 0.5 mg ( Intramuscular See  Alternative 09/20/16 0045)  ziprasidone (GEODON) capsule 20 mg (20 mg Oral Given 09/18/16 1328)    Or  ziprasidone (GEODON) injection 10 mg ( Intramuscular See Alternative 09/18/16 1328)  paliperidone (INVEGA) 24 hr tablet 6 mg (6 mg Oral Given 09/20/16 1759)    Or  ziprasidone (GEODON) injection 10 mg ( Intramuscular See Alternative 09/20/16 1759)  LORazepam (ATIVAN) injection 1 mg (1 mg Intramuscular Given 09/16/16 0848)  diphenhydrAMINE (BENADRYL) injection 25 mg (25 mg Intramuscular Given 09/16/16 0848)     The patient appears reasonably screen and/or stabilized for discharge and I doubt any other medical condition or other [facility] requiring further screening, evaluation, or treatment in the ED at this time prior to discharge.    Final Clinical Impressions(s) / ED Diagnoses   Final diagnoses:  Hyperthyroidism  Pain in both hands    New Prescriptions New Prescriptions   No medications on file     Deno Etienne, DO 09/21/16 0200
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
## 2016-09-21 NOTE — ED Notes (Signed)
Pt not a good historian, pt tearful, frightened and crying stating "my heart hurts" pt uncooperative for assessment. Pt states her hands also hurt, small blister like appearances to her hands. Pt continues to cry and state "Im not crazy"

## 2016-09-21 NOTE — Discharge Instructions (Signed)
You need to continue taking your methimazole. If you feel like the side effects are too much for you this needs to be discussed with the family physician for alternative medications.
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
## 2016-09-22 MED ORDER — PROPRANOLOL HCL 10 MG PO TABS
10.0000 mg | ORAL_TABLET | Freq: Two times a day (BID) | ORAL | Status: DC
  Administered 2016-09-22 – 2016-09-27 (×10): 10 mg via ORAL
  Filled 2016-09-22 (×16): qty 1

## 2016-09-22 NOTE — Progress Notes (Signed)
Nursing Note 09/22/2016 [id number]  Data Reports sleeping good.  Irritable and hyperreligious today.  Spent most of shift in room.  Needed redirection from female staff to put clothes on in room twice.  Per female RN patient was sitting on floor in bathroom with no undergarment on covering ears at one point, patient was prompted to get dressed and returned to bed.  Mood labile.  Came out of room at one point.  Paranoid, will not let one specific tech bring her food for fear of tech "poisoning" it.  Denies HI, SI, AVH.  Took meds for another RN, was hesitant but eventually took PO voluntarily. Patient spent most of day in room today.   Action Spoke with patient 1:1, nurse offered support to patient throughout shift.  Continues to be monitored on 15 minute checks for safety.  Response Remains safe though isolating and irritable throughout day.
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
## 2016-09-22 NOTE — [facility] Group Notes (Signed)
[place] LCSW Group Therapy  09/22/2016 , 3:12 PM   Type of Therapy:  Group Therapy  Participation Level:  Active  Participation Quality:  Attentive  Affect:  Appropriate  Cognitive:  Alert  Insight:  Improving  Engagement in Therapy:  Engaged  Modes of Intervention:  Discussion, Exploration and Socialization  Summary of Progress/Problems: Today's group focused on the term Diagnosis.  Participants were asked to define the term, and then pronounce whether it is a negative, positive or neutral term. Invited.  In bed asleep.  Did not attend  Veronica Perez 09/22/2016 , 3:12 PM

## 2016-09-22 NOTE — Progress Notes (Signed)
Recreation Therapy Notes  Date: 09/22/16 Time: 1000 Location: [address] Dayroom  Group Topic: Coping Skills  Goal Area(s) Addresses:  Patient will be able to identify the benefits of positive coping skills. Patient will be able to identify the of using coping skills post d/c.  Intervention: Mindmap worksheets, pencils  Activity: Mindmap.  Patients were given a worksheet of a blank mindmap.  Patients and LRT filled in the first eight boxes together with situations/circumstances that would cause you use coping skills.  Patients identified the eight areas as relationships, depression, anxiety, family, career, arguments, peer pressure and force.   Education: Radiographer, therapeutic, Dentist.   Education Outcome: Acknowledges understanding/In group clarification offered/Needs additional education.   Clinical Observations/Feedback: Pt did not attend group.   Victorino Sparrow, LRT/CTRS       Victorino Sparrow A 09/22/2016 11:54 AM

## 2016-09-22 NOTE — Progress Notes (Signed)
Did not attend. 

## 2016-09-22 NOTE — Progress Notes (Signed)
[facility] Second Physician Opinion Progress Note for Medication Administration to Non-consenting Patients (For Involuntarily Committed Patients)  Patient: Veronica Perez Date of Birth: [id number] MRN: [id number]  Reason for the Medication: The patient, without the benefit of the specific treatment measure, is incapable of participating in any available treatment plan that will give the patient a realistic opportunity of improving the patient's condition. There is, without the benefit of the specific treatment measure, a significant possibility that the patient will harm self or others before improvement of the patient's condition is realized.  Consideration of Side Effects: Consideration of the side effects related to the medication plan has been given.  Rationale for Medication Administration: Dr. Shea Evans has requested that I reevaluate patient for continuation of medication over objection. Report from staff is that patient has improved partially compared to initial presentation  But that she continues to need significant encouragement to take medications, remains vaguely paranoid, and remains disorganized at times- staff reports she has needed redirection for taking clothes off,  spending long periods of time in room/bathroom, and has been intermittently loud and verbally aggressive towards staff     Jenne Campus, MD 09/22/16  5:12 PM   This documentation is good for (7) seven days from the date of the MD signature. New documentation must be completed every seven (7) days with detailed justification in the medical record if the patient requires continued non-emergent administration of psychotropic medications.

## 2016-09-22 NOTE — Progress Notes (Signed)
Writer spoke with patient about how her day had been and she reported that she slept some today and felt a little better. Writer discussed medications scheduled for tonight and patient reported that she got a shot today and she did not need anymore medicine because it was too strong. She repeatedly talked about how her medicine was too strong and she was good and needed to go home. Writer encouraged her to take medication but she continued to refuse. Support given and safety maintained on unit with 15 min checks.

## 2016-09-22 NOTE — Progress Notes (Signed)
[facility] MD Progress Note  09/22/2016 11:00 AM Veronica Perez  MRN:  [id number] Subjective: Patient states " I am just tired.'    Objective:Patient seen and chart reviewed.Discussed patient with treatment team.  Pt today seen as still paranoid , continues to lack insight in to her illness, she is on forced medication order and hence is taking her psychotropics like Invega , received an [place] sustenna IM 234 mg - first dose 09/21/2016. Pt however has not been very compliant with her thyroid medications . She does have hyperthyroidism and her mood sx also likely contributed by the same. Per RN continues to need a lot of encouragement to take medications and is noted as paranoid often.      Principal Problem: Bipolar disorder, current episode manic severe with psychotic features [facility]) Diagnosis:   Patient Active Problem List   Diagnosis Date Noted  . Bipolar disorder, current episode manic severe with psychotic features ([place]) [F31.2] 09/16/2016  . PTSD (post-traumatic stress disorder) [F43.10] 09/16/2016  . Panic disorder [F41.0] 09/16/2016  . Low TSH level [R94.6] 05/30/2016  . Heart palpitations [R00.2] 05/30/2016  . Bipolar I disorder, most recent episode (or current) manic ([place]) [F31.10] 12/22/2012  . 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 PANIC ATTACKS [F41.0] 04/02/2006  . TOBACCO DEPENDENCE [F17.200] 04/02/2006  . RHINITIS, ALLERGIC [J30.9] 04/02/2006  . GASTROESOPHAGEAL REFLUX, NO ESOPHAGITIS [K21.9] 04/02/2006   Total Time spent with patient: 25 minutes  Past Psychiatric History: Please see H&P and above collateral information from mother.   Past Medical History:  Past Medical History:  Diagnosis Date  . Bipolar disorder ([place])   . Depression   . Panic attacks     Past Surgical History:  Procedure Laterality Date  . ADENOIDECTOMY    . OVARIAN CYST SURGERY    . TONSILLECTOMY     Family History:  Family History  Problem  Relation Age of Onset  . Diabetes Father   . Heart disease Father   . Cancer Maternal Grandmother        breast cancer  . Bipolar disorder Mother    Family Psychiatric  History: Please see H&P.  Social History: Please see H&P.  History  Alcohol Use No     History  Drug Use No    Social History   Social History  . Marital status: Single    Spouse name: N/A  . Number of children: N/A  . Years of education: N/A   Social History Main Topics  . Smoking status: Former Smoker    Quit date: 01/18/2012  . Smokeless tobacco: Never Used  . Alcohol use No  . Drug use: No  . Sexual activity: Not Asked   Other Topics Concern  . None   Social History Narrative  . None   Additional Social History:   Sleep: Fair  Appetite:  Fair  Current Medications: Current Facility-Administered Medications  Medication Dose Route Frequency Provider Last Rate Last Dose  . acetaminophen (TYLENOL) tablet 650 mg  650 mg Oral Q6H PRN [name], NP   650 mg at 09/22/16 4098  . alum & mag hydroxide-simeth (MAALOX/MYLANTA) 200-200-20 MG/5ML suspension 30 mL  30 mL Oral Q6H PRN [name], NP      . benztropine (COGENTIN) tablet 0.5 mg  0.5 mg Oral QHS Ghina Bittinger, [name], MD   0.5 mg at 09/18/16 2223  . ibuprofen (ADVIL,MOTRIN) tablet 600 mg  600 mg Oral Q8H PRN [name], NP      .  lamoTRIgine (LAMICTAL) tablet 25 mg  25 mg Oral Daily [name], MD   25 mg at 09/22/16 9983  . LORazepam (ATIVAN) tablet 0.5 mg  0.5 mg Oral Q6H PRN [name], MD   0.5 mg at 09/21/16 0445   Or  . LORazepam (ATIVAN) injection 0.5 mg  0.5 mg Intramuscular Q6H PRN [name], MD   0.5 mg at 09/17/16 0737  . magnesium hydroxide (MILK OF MAGNESIA) suspension 30 mL  30 mL Oral Daily PRN [name], NP      . methimazole (TAPAZOLE) tablet 10 mg  10 mg Oral BID [name], NP   10 mg at 09/22/16 3825  . multivitamin with minerals tablet 1 tablet  1 tablet Oral Daily [name], NP    1 tablet at 09/22/16 [phone]  . nicotine (NICODERM CQ - dosed in mg/24 hours) patch 21 mg  21 mg Transdermal Daily [name], NP      . ondansetron [facility]) tablet 4 mg  4 mg Oral Q8H PRN [name], NP      . Derrill Memo ON 09/26/2016] paliperidone (INVEGA SUSTENNA) injection 156 mg  156 mg Intramuscular Once Nanci Pina, FNP       Followed by  . [START ON 10/20/2016] Paliperidone Palmitate SUSP 117 mg  117 mg Intramuscular Q28 days Nanci Pina, FNP      . propranolol (INDERAL) tablet 10 mg  10 mg Oral BID Carnita Golob, MD      . rOPINIRole (REQUIP) tablet 1 mg  1 mg Oral QHS Kamareon Sciandra, MD   1 mg at 09/18/16 2223  . traZODone (DESYREL) tablet 50 mg  50 mg Oral QHS PRN [name], NP      . ziprasidone (GEODON) capsule 20 mg  20 mg Oral Daily PRN [name], MD   20 mg at 09/18/16 1328   Or  . ziprasidone (GEODON) injection 10 mg  10 mg Intramuscular Daily PRN [name], MD        Lab Results:  No results found for this or any previous visit (from the past 48 hour(s)).  Blood Alcohol level:  Lab Results  Component Value Date   ETH <5 09/13/2016   ETH <5 76/73/4193    Metabolic Disorder Labs: Lab Results  Component Value Date   HGBA1C 4.9 09/17/2016   MPG 94 09/17/2016   Lab Results  Component Value Date   PROLACTIN 69.3 (H) 09/17/2016   Lab Results  Component Value Date   CHOL 134 09/17/2016   TRIG 92 09/17/2016   HDL 38 (L) 09/17/2016   CHOLHDL 3.5 09/17/2016   VLDL 18 09/17/2016   LDLCALC 78 09/17/2016    Physical Findings: AIMS: Facial and Oral Movements Muscles of Facial Expression: None, normal Lips and Perioral Area: None, normal Jaw: None, normal Tongue: None, normal,Extremity Movements Upper (arms, wrists, hands, fingers): None, normal Lower (legs, knees, ankles, toes): None, normal, Trunk Movements Neck, shoulders, hips: None, normal, Overall Severity Severity of abnormal movements (highest score from questions above):  None, normal Incapacitation due to abnormal movements: None, normal Patient's awareness of abnormal movements (rate only patient's report): No Awareness, Dental Status Current problems with teeth and/or dentures?: No Does patient usually wear dentures?: No  CIWA:    COWS:     Musculoskeletal: Strength & Muscle Tone: within normal limits Gait & Station: normal Patient leans: N/A  Psychiatric Specialty Exam: Physical Exam  Nursing note and vitals reviewed.  Review of Systems  Psychiatric/Behavioral: The patient is nervous/anxious.   All other systems reviewed and are negative.    Blood pressure (!) 149/82, pulse 100, temperature 98.4 F (36.9 C), temperature source Oral, resp. rate 20, SpO2 98 %.There is no height or weight on file to calculate BMI.  General Appearance: improved grooming  Eye Contact:  Fair  Speech:  Normal Rate  Volume:  Decreased  Mood:  Anxious and Irritable  Affect:  Congruent vaguely irritable   Thought Process:  Linear and Descriptions of Associations: Intact  Orientation: alert , oriented to self, situation  Thought Content:  Delusions and Paranoid Ideation  Suicidal Thoughts:  No   Homicidal Thoughts:  No  Memory:  Immediate;   Fair Recent;   Fair Remote;   Fair  Judgement:  Other:  impaired  Insight:  limited  Psychomotor Activity:  Restlessness  Concentration:  Concentration: Poor and Attention Span: Fair  Recall:  [employer] of Knowledge:  Fair  Language:  Fair  Akathisia:  No  Handed:  Right  AIMS (if indicated):     Assets:  Desire for Improvement  ADL's:  Intact  Cognition:  WNL  Sleep:  Number of Hours: 2.5   Will continue today 09/22/16 plan as below except where it is noted.   Treatment Plan Summary: Patient remains disorganized in thought process , continues to be guarded and paranoid , although with partial progress. Her insight is still impaired , she continues to refuse her thyroid medications on and off , did take it today.  Her sleep is noted as poor per EHR . Will continue to need medication readjustment and support.   Continue forced medications order.  Daily contact with patient to assess and evaluate symptoms and progress in treatment, Medication management and Plan see below  Continue Invega sustenna - received 234 mg IM on 09/21/16 . Invega sustenna IM 156 mg on 09/26/16 , next dose 117 mg on 10/20/16.  Geodon 20 mg PO/10 mg IM prn for severe anxiety /agitation. Continue Lamictal restarted at 25 mg po daily for mood sx. Continue Requip 1 mg po qhs for RLS. Increase Trazodone to 100 mg po qhs for sleep issues. Pt however has been refusing on and off . Continue Tapazole 10 mg po bid for hyperthyroidism. Pt has been partially compliant on her medications . VS reviewed. Add Propranolol 10 mg po bid for elevated HR. EKG reviewed - qtc - wnl, sinus tachycardia noted likely due to her thyroid sx, as well as medications offered. CSW will continue to work on disposition.    Veronica Borrero, MD 09/22/2016, 11:00 AM

## 2016-09-23 MED ORDER — LAMOTRIGINE 25 MG PO TABS
25.0000 mg | ORAL_TABLET | Freq: Two times a day (BID) | ORAL | Status: DC
  Administered 2016-09-23 – 2016-09-24 (×2): 25 mg via ORAL
  Filled 2016-09-23 (×7): qty 1

## 2016-09-23 MED ORDER — PALIPERIDONE PALMITATE 156 MG/ML IM SUSP
156.0000 mg | Freq: Once | INTRAMUSCULAR | Status: AC
  Administered 2016-09-27: 156 mg via INTRAMUSCULAR
  Filled 2016-09-23: qty 1

## 2016-09-23 MED ORDER — PALIPERIDONE PALMITATE 117 MG/0.75ML IM SUSP: 117.0000 mg | INTRAMUSCULAR | Status: DC

## 2016-09-23 NOTE — Progress Notes (Signed)
Nursing Note 09/23/2016 [id number]  Data Reports sleeping good with PRN sleep med.  Rates depression 5/10, hopelessness 10/10, and anxiety 10/10. Affect labile, irritable, at times calm demeanor.  Denies HI, SI, AVH.  AM very tearful pacing up and down hallway yelling "they took my notebook! They took my notebook!" then was talking on phone very loudly with someone suggesting she was getting a Chief Executive Officer. RN and another tech searched room with patient's permission and returned notebook, patient stated "nevermind they found it" while on the phone.  RN spoke with patient, RN had to sit with patient for about 30 minutes before she would take medicine, writing down each medicine and dosage as RN read it off.  Took all scheduled meds except for scheduled lamictal this AM.  Took a PRN ativan with AM meds (all given late).  Patient was mostly pleasant the rest of the day, seen talking to other patients, laughing, dancing in hallway.  Needed to talk to RN and write medicine down to take PM medicine.  Action Spoke with patient 1:1, nurse offered support to patient throughout shift.  Needed a lot of redirection and support today.  Continues to be monitored on 15 minute checks for safety.  Response Remains safe though labile and irritable at times.  Continues to need a lot of support from staff.

## 2016-09-23 NOTE — Progress Notes (Signed)
Recreation Therapy Notes  Animal-Assisted Activity (AAA) Program Checklist/Progress Notes Patient Eligibility Criteria Checklist & Daily Group note for Rec Tx Intervention  Date: 08.21.2018 Time: 2:45pm Location: [address]    AAA/T Program Assumption of Risk Form signed by Patient/ or Parent Legal Guardian Yes  Patient is free of allergies or sever asthma Yes  Patient reports no fear of animals Yes  Patient reports no history of cruelty to animals Yes  Patient understands his/her participation is voluntary Yes  Patient washes hands before animal contact Yes  Patient washes hands after animal contact Yes  Behavioral Response: Appropriate   Education: Hand Washing, Appropriate Animal Interaction   Education Outcome: Acknowledges education.   Clinical Observations/Feedback: Patient discussed with MD for appropriateness in pet therapy session. Both LRT and MD agree patient is appropriate for participation. Patient offered participation in session and signed necessary consent form without issue. Patient pet therapy dog appropriately and interacted well with peers in session. Patient remained in session for approximately 20 minutes, before returning to locked 500 hall.   Laureen Ochs Dakota Stangl, LRT/CTRS        Lane Hacker 09/23/2016 3:18 PM

## 2016-09-23 NOTE — Progress Notes (Signed)
Recreation Therapy Notes  Date: 09/23/16 Time: 1000 Location: 500 Hall Dayroom  Group Topic: Leisure Education  Goal Area(s) Addresses:  Patient will identify positive leisure activities.  Patient will identify one positive benefit of participation in leisure activities.   Behavioral Response: Minimal  Intervention: Tin can with activities, white board, dry erase marker, eraser  Activity: Leisure Pictionary.  Patients had to pick a slip of paper from the tin can.  Each slip of paper had a leisure activity on it.  LRT divided the group into two groups.  Patients were to draw a picture of what was on their slip of paper.  Their team would get a minute to guess the picture.  If their team did not guess correctly, the other team got a chance to still the point.  The team with the most points won.  Education:  Leisure Education, Dentist  Education Outcome: Acknowledges education/In group clarification offered/Needs additional education  Clinical Observations/Feedback: Pt seemed flat and gave minimal participation.  Pt stated taking care of her daughter was leisure.  Pt attempted to guess one of the drawings.  Pt left early and did not return.   Victorino Sparrow, LRT/CTRS       Victorino Sparrow A 09/23/2016 11:48 AM

## 2016-09-23 NOTE — Progress Notes (Addendum)
  DATA ACTION RESPONSE  Objective- Pt. is visible in the dayroom, seen interacting with peers. Presents with a labile/irritable affect and mood. Pt remains hyper-verbal and tangential with thought process. Preoccupied with discharge and speaking to her attorneys. Pt refused bedtime meds. No abnormal s/s.  Subjective- Denies having any SI/HI/AVH/Pain at this time. Pt. states "I might get to leave Friday: I'm not taking anything. It will kill me". Needs redirection but remains safe on the unit.  1:1 interaction in private to establish rapport. Encouragement, education, & support given from staff.    Safety maintained with Q 15 checks. Continue with POC.

## 2016-09-23 NOTE — [facility] Group Notes (Signed)
[facility] LCSW Group Therapy  09/23/2016 , 2:47 PM   Type of Therapy:  Group Therapy  Participation Level:  Active  Participation Quality:  Attentive  Affect:  Appropriate  Cognitive:  Alert  Insight:  Improving  Engagement in Therapy:  Engaged  Modes of Intervention:  Discussion, Exploration and Socialization  Summary of Progress/Problems: Today's group focused on the term Diagnosis.  Participants were asked to define the term, and then pronounce whether it is a negative, positive or neutral term. Invited.  Chose to not attend.  Roque Lias B 09/23/2016 , 2:47 PM

## 2016-09-23 NOTE — Plan of Care (Signed)
Problem: Safety: Goal: Ability to redirect hostility and anger into socially appropriate behaviors will improve Outcome: Progressing Pt appears calmer this evening and does not need a lot of redirection.

## 2016-09-23 NOTE — Progress Notes (Signed)
[facility] MD Progress Note  09/23/2016 3:09 PM INICE SANLUIS  MRN:  [id number] Subjective:Pt states " I do not need any of these medications. My mom wants me locked up all the time. She is just trying to separate me from my daughter."   Objective:Patient seen and chart reviewed.Discussed patient with treatment team.  Pt today seen as still vaguely irritable often, however is less intrusive , less manic than on admission. Pt continues to have paranoia , mostly directed to staff as well as family , but is making progress. She does not want specific staff to give her meals , but takes it from others. Pt received Invega sustenna - first dose - 09/21/16. Pt also has been more compliant on her thyroid medications. Continue to encourage and support.       Principal Problem: Bipolar disorder, current episode manic severe with psychotic features [facility]) Diagnosis:   Patient Active Problem List   Diagnosis Date Noted  . Bipolar disorder, current episode manic severe with psychotic features ([place]) [F31.2] 09/16/2016  . PTSD (post-traumatic stress disorder) [F43.10] 09/16/2016  . Panic disorder [F41.0] 09/16/2016  . Low TSH level [R94.6] 05/30/2016  . Heart palpitations [R00.2] 05/30/2016  . Bipolar I disorder, most recent episode (or current) manic ([place]) [F31.10] 12/22/2012  . 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 PANIC ATTACKS [F41.0] 04/02/2006  . TOBACCO DEPENDENCE [F17.200] 04/02/2006  . RHINITIS, ALLERGIC [J30.9] 04/02/2006  . GASTROESOPHAGEAL REFLUX, NO ESOPHAGITIS [K21.9] 04/02/2006   Total Time spent with patient: 25 minutes  Past Psychiatric History: Please see H&P and above collateral information from mother.   Past Medical History:  Past Medical History:  Diagnosis Date  . Bipolar disorder ([place])   . Depression   . Panic attacks     Past Surgical History:  Procedure Laterality Date  . ADENOIDECTOMY    . OVARIAN CYST SURGERY    .  TONSILLECTOMY     Family History:  Family History  Problem Relation Age of Onset  . Diabetes Father   . Heart disease Father   . Cancer Maternal Grandmother        breast cancer  . Bipolar disorder Mother    Family Psychiatric  History: Please see H&P.  Social History: Please see H&P.  History  Alcohol Use No     History  Drug Use No    Social History   Social History  . Marital status: Single    Spouse name: N/A  . Number of children: N/A  . Years of education: N/A   Social History Main Topics  . Smoking status: Former Smoker    Quit date: 01/18/2012  . Smokeless tobacco: Never Used  . Alcohol use No  . Drug use: No  . Sexual activity: Not Asked   Other Topics Concern  . None   Social History Narrative  . None   Additional Social History:   Sleep: Fair  Appetite:  Fair  Current Medications: Current Facility-Administered Medications  Medication Dose Route Frequency Provider Last Rate Last Dose  . acetaminophen (TYLENOL) tablet 650 mg  650 mg Oral Q6H PRN [name], NP   650 mg at 09/22/16 0962  . alum & mag hydroxide-simeth (MAALOX/MYLANTA) 200-200-20 MG/5ML suspension 30 mL  30 mL Oral Q6H PRN [name], NP      . benztropine (COGENTIN) tablet 0.5 mg  0.5 mg Oral QHS Lilybelle Mayeda, [name], MD   0.5 mg at 09/18/16 2223  . ibuprofen (  ADVIL,MOTRIN) tablet 600 mg  600 mg Oral Q8H PRN [name], NP   600 mg at 09/22/16 2012  . lamoTRIgine (LAMICTAL) tablet 25 mg  25 mg Oral BID Socorro Kanitz, MD      . LORazepam (ATIVAN) tablet 0.5 mg  0.5 mg Oral Q6H PRN [name], MD   0.5 mg at 09/23/16 1114   Or  . LORazepam (ATIVAN) injection 0.5 mg  0.5 mg Intramuscular Q6H PRN [name], MD   0.5 mg at 09/17/16 0737  . magnesium hydroxide (MILK OF MAGNESIA) suspension 30 mL  30 mL Oral Daily PRN [name], NP      . methimazole (TAPAZOLE) tablet 10 mg  10 mg Oral BID [name], NP   10 mg at 09/23/16 1115  . multivitamin with  minerals tablet 1 tablet  1 tablet Oral Daily [name], NP   1 tablet at 09/22/16 [phone]  . ondansetron (ZOFRAN) tablet 4 mg  4 mg Oral Q8H PRN [name], NP      . Derrill Memo ON 09/26/2016] paliperidone (INVEGA SUSTENNA) injection 156 mg  156 mg Intramuscular Once [name], MD       Followed by  . [START ON 10/20/2016] Paliperidone Palmitate SUSP 117 mg  117 mg Intramuscular Q28 days Jeramy Dimmick, [name], MD      . propranolol (INDERAL) tablet 10 mg  10 mg Oral BID [name], MD   10 mg at 09/23/16 1114  . rOPINIRole (REQUIP) tablet 1 mg  1 mg Oral QHS Lynzie Cliburn, MD   1 mg at 09/18/16 2223  . traZODone (DESYREL) tablet 50 mg  50 mg Oral QHS PRN [name], NP      . ziprasidone (GEODON) capsule 20 mg  20 mg Oral Daily PRN [name], MD   20 mg at 09/18/16 1328   Or  . ziprasidone (GEODON) injection 10 mg  10 mg Intramuscular Daily PRN [name], MD        Lab Results:  No results found for this or any previous visit (from the past 48 hour(s)).  Blood Alcohol level:  Lab Results  Component Value Date   ETH <5 09/13/2016   ETH <5 19/37/9024    Metabolic Disorder Labs: Lab Results  Component Value Date   HGBA1C 4.9 09/17/2016   MPG 94 09/17/2016   Lab Results  Component Value Date   PROLACTIN 69.3 (H) 09/17/2016   Lab Results  Component Value Date   CHOL 134 09/17/2016   TRIG 92 09/17/2016   HDL 38 (L) 09/17/2016   CHOLHDL 3.5 09/17/2016   VLDL 18 09/17/2016   LDLCALC 78 09/17/2016    Physical Findings: AIMS: Facial and Oral Movements Muscles of Facial Expression: None, normal Lips and Perioral Area: None, normal Jaw: None, normal Tongue: None, normal,Extremity Movements Upper (arms, wrists, hands, fingers): None, normal Lower (legs, knees, ankles, toes): None, normal, Trunk Movements Neck, shoulders, hips: None, normal, Overall Severity Severity of abnormal movements (highest score from questions above): None,  normal Incapacitation due to abnormal movements: None, normal Patient's awareness of abnormal movements (rate only patient's report): No Awareness, Dental Status Current problems with teeth and/or dentures?: No Does patient usually wear dentures?: No  CIWA:    COWS:     Musculoskeletal: Strength & Muscle Tone: within normal limits Gait & Station: normal Patient leans: N/A  Psychiatric Specialty Exam: Physical Exam  Nursing note and vitals reviewed.   Review of Systems  Psychiatric/Behavioral:  The patient is nervous/anxious.   All other systems reviewed and are negative.    Blood pressure 138/80, pulse (!) 108, temperature 98.4 F (36.9 C), temperature source Oral, resp. rate 20, SpO2 98 %.There is no height or weight on file to calculate BMI.  General Appearance: Fairly Groomed  Eye Contact:  Fair  Speech:  Normal Rate  Volume:  varies  Mood:  Anxious and Irritable  Affect:  Congruent vaguely irritable  Thought Process:  Linear and Descriptions of Associations: Intact  Orientation: alert, oriented to situation  Thought Content:  Delusions and Paranoid Ideation,improving  Suicidal Thoughts:  No   Homicidal Thoughts:  No  Memory:  Immediate;   Fair Recent;   Fair Remote;   Fair  Judgement:  Other:  impaired  Insight:  Shallow  Psychomotor Activity:  Normal  Concentration:  Concentration: Fair and Attention Span: Fair  Recall:  [employer] of Knowledge:  Fair  Language:  Fair  Akathisia:  No  Handed:  Right  AIMS (if indicated):     Assets:  Desire for Improvement  ADL's:  Intact  Cognition:  WNL  Sleep:  Number of Hours: 6.75    Will continue today 09/23/16  plan as below except where it is noted.  Treatment Plan Summary: Patient remains delusional, anxious on and off , but her manic sx are better and her paranoia is improving. Pt is currently more compliant on her thyroid medication, she was noncompliant with it previously , which likely could have also led to  her manic sx .  Forced medication order renewed - valid for 7 days .  Daily contact with patient to assess and evaluate symptoms and progress in treatment, Medication management and Plan see below  Continue Invega sustenna - received 234 mg IM on 09/21/16 . Invega sustenna IM 156 mg on 09/26/16 , next dose 117 mg on 10/20/16.  Geodon 20 mg PO/10 mg IM prn for severe anxiety /agitation. Increase Lamictal to 25 mg po bid for mood sx. Continue Requip 1 mg po qhs for RLS. Increase Trazodone to 100 mg po qhs for sleep issues. Pt however has been refusing on and off . Continue Tapazole 10 mg po bid for hyperthyroidism. Pt has been partially compliant on her medications . VS reviewed.  Propranolol 10 mg po bid for elevated HR. EKG reviewed - qtc - wnl, sinus tachycardia noted likely due to her thyroid sx, as well as medications offered. CSW will continue to work on disposition.    Karmen Altamirano, MD 09/23/2016, 3:09 PM

## 2016-09-24 MED ORDER — TRAZODONE HCL 100 MG PO TABS
100.0000 mg | ORAL_TABLET | Freq: Every day | ORAL | Status: DC
  Administered 2016-09-26: 100 mg via ORAL
  Filled 2016-09-24 (×7): qty 1

## 2016-09-24 MED ORDER — ROPINIROLE HCL 1 MG PO TABS
1.0000 mg | ORAL_TABLET | Freq: Every evening | ORAL | Status: DC | PRN
  Administered 2016-09-26: 1 mg via ORAL
  Filled 2016-09-24: qty 1

## 2016-09-24 NOTE — Progress Notes (Signed)
Did not attend group 

## 2016-09-24 NOTE — Progress Notes (Signed)
Adult Psychoeducational Group Note  Date:  09/24/2016 Time:  12:17 AM  Group Topic/Focus:  Wrap-Up Group:   The focus of this group is to help patients review their daily goal of treatment and discuss progress on daily workbooks.  Participation Level:  Active  Participation Quality:  Appropriate  Affect:  Appropriate  Cognitive:  Appropriate  Insight: Appropriate  Engagement in Group:  Engaged  Modes of Intervention:  Discussion  Additional Comments:  Pt stated her goal for today was to talk with her doctor about her discharge plan. Pt stated she was able to accomplished this goal today. Pt rated her overall day a 10. Pt stated she attend all groups held today. Pt stated that she received a phone call from her parents that help improve her day.  Candy Sledge 09/24/2016, 12:17 AM

## 2016-09-24 NOTE — Progress Notes (Signed)
[facility] MD Progress Note  09/24/2016 3:47 PM Veronica Perez  MRN:  [id number] Subjective:Pt states " I do not want lamictal , it gives me nightmares.'  Objective:Patient seen and chart reviewed.Discussed patient with treatment team.  Pt continues to be anxious on and off , restless as observed this AM. Pt reports poor sleep last night . Blames it on the lamictal, states it gives her nightmares. Pt per RN has been refusing her sleep medications like requip, trazodone. Pt is on forced medication order , received Invega sustenna IM first dose on Sunday , next dose due this Friday. Plan to discharge her home on Friday after second dose. Pt encouraged to attend groups and take medications.        Principal Problem: Bipolar disorder, current episode manic severe with psychotic features [facility]) Diagnosis:   Patient Active Problem List   Diagnosis Date Noted  . Bipolar disorder, current episode manic severe with psychotic features ([place]) [F31.2] 09/16/2016  . PTSD (post-traumatic stress disorder) [F43.10] 09/16/2016  . Panic disorder [F41.0] 09/16/2016  . Low TSH level [R94.6] 05/30/2016  . Heart palpitations [R00.2] 05/30/2016  . Bipolar I disorder, most recent episode (or current) manic ([place]) [F31.10] 12/22/2012  . 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 PANIC ATTACKS [F41.0] 04/02/2006  . TOBACCO DEPENDENCE [F17.200] 04/02/2006  . RHINITIS, ALLERGIC [J30.9] 04/02/2006  . GASTROESOPHAGEAL REFLUX, NO ESOPHAGITIS [K21.9] 04/02/2006   Total Time spent with patient: 30 minutes  Past Psychiatric History: Please see H&P and above collateral information from mother.   Past Medical History:  Past Medical History:  Diagnosis Date  . Bipolar disorder ([place])   . Depression   . Panic attacks     Past Surgical History:  Procedure Laterality Date  . ADENOIDECTOMY    . OVARIAN CYST SURGERY    . TONSILLECTOMY     Family History:  Family History  Problem  Relation Age of Onset  . Diabetes Father   . Heart disease Father   . Cancer Maternal Grandmother        breast cancer  . Bipolar disorder Mother    Family Psychiatric  History: Please see H&P.  Social History: Please see H&P.  History  Alcohol Use No     History  Drug Use No    Social History   Social History  . Marital status: Single    Spouse name: N/A  . Number of children: N/A  . Years of education: N/A   Social History Main Topics  . Smoking status: Former Smoker    Quit date: 01/18/2012  . Smokeless tobacco: Never Used  . Alcohol use No  . Drug use: No  . Sexual activity: Not Asked   Other Topics Concern  . None   Social History Narrative  . None   Additional Social History:   Sleep: Poor  Appetite:  Fair  Current Medications: Current Facility-Administered Medications  Medication Dose Route Frequency Provider Last Rate Last Dose  . acetaminophen (TYLENOL) tablet 650 mg  650 mg Oral Q6H PRN [name], NP   650 mg at 09/22/16 0623  . alum & mag hydroxide-simeth (MAALOX/MYLANTA) 200-200-20 MG/5ML suspension 30 mL  30 mL Oral Q6H PRN [name], NP      . benztropine (COGENTIN) tablet 0.5 mg  0.5 mg Oral QHS Leul Narramore, [name], MD   0.5 mg at 09/18/16 2223  . ibuprofen (ADVIL,MOTRIN) tablet 600 mg  600 mg Oral Q8H PRN Waylan Boga  Y, NP   600 mg at 09/24/16 9702  . LORazepam (ATIVAN) tablet 0.5 mg  0.5 mg Oral Q6H PRN [name], MD   0.5 mg at 09/24/16 1124   Or  . LORazepam (ATIVAN) injection 0.5 mg  0.5 mg Intramuscular Q6H PRN [name], MD   0.5 mg at 09/17/16 0737  . magnesium hydroxide (MILK OF MAGNESIA) suspension 30 mL  30 mL Oral Daily PRN [name], NP      . methimazole (TAPAZOLE) tablet 10 mg  10 mg Oral BID [name], NP   10 mg at 09/24/16 6378  . multivitamin with minerals tablet 1 tablet  1 tablet Oral Daily [name], NP   1 tablet at 09/24/16 [phone]  . ondansetron (ZOFRAN) tablet 4 mg  4 mg Oral Q8H  PRN [name], NP      . Derrill Memo ON 09/26/2016] paliperidone (INVEGA SUSTENNA) injection 156 mg  156 mg Intramuscular Once [name], MD       Followed by  . [START ON 10/20/2016] Paliperidone Palmitate SUSP 117 mg  117 mg Intramuscular Q28 days Katrice Goel, [name], MD      . propranolol (INDERAL) tablet 10 mg  10 mg Oral BID [name], MD   10 mg at 09/24/16 0277  . rOPINIRole (REQUIP) tablet 1 mg  1 mg Oral QHS PRN [name], MD      . traZODone (DESYREL) tablet 100 mg  100 mg Oral QHS Tkeyah Burkman, MD      . ziprasidone (GEODON) capsule 20 mg  20 mg Oral Daily PRN [name], MD   20 mg at 09/18/16 1328   Or  . ziprasidone (GEODON) injection 10 mg  10 mg Intramuscular Daily PRN [name], MD        Lab Results:  No results found for this or any previous visit (from the past 45 hour(s)).  Blood Alcohol level:  Lab Results  Component Value Date   ETH <5 09/13/2016   ETH <5 41/28/7867    Metabolic Disorder Labs: Lab Results  Component Value Date   HGBA1C 4.9 09/17/2016   MPG 94 09/17/2016   Lab Results  Component Value Date   PROLACTIN 69.3 (H) 09/17/2016   Lab Results  Component Value Date   CHOL 134 09/17/2016   TRIG 92 09/17/2016   HDL 38 (L) 09/17/2016   CHOLHDL 3.5 09/17/2016   VLDL 18 09/17/2016   LDLCALC 78 09/17/2016    Physical Findings: AIMS: Facial and Oral Movements Muscles of Facial Expression: None, normal Lips and Perioral Area: None, normal Jaw: None, normal Tongue: None, normal,Extremity Movements Upper (arms, wrists, hands, fingers): None, normal Lower (legs, knees, ankles, toes): None, normal, Trunk Movements Neck, shoulders, hips: None, normal, Overall Severity Severity of abnormal movements (highest score from questions above): None, normal Incapacitation due to abnormal movements: None, normal Patient's awareness of abnormal movements (rate only patient's report): No Awareness, Dental Status Current problems  with teeth and/or dentures?: No Does patient usually wear dentures?: No  CIWA:    COWS:     Musculoskeletal: Strength & Muscle Tone: within normal limits Gait & Station: normal Patient leans: N/A  Psychiatric Specialty Exam: Physical Exam  Nursing note and vitals reviewed.   Review of Systems  Psychiatric/Behavioral: The patient is nervous/anxious.   All other systems reviewed and are negative.    Blood pressure 130/82, pulse 99, temperature 98.2 F (36.8 C), temperature source Oral, resp. rate 20, SpO2 98 %.There  is no height or weight on file to calculate BMI.  General Appearance: Fairly Groomed  Eye Contact:  Fair  Speech:  Normal Rate  Volume:  varies  Mood:  Anxious and Irritable  Affect:  Congruent   Thought Process:  Linear and Descriptions of Associations: Intact  Orientation: alert  Thought Content:  Delusions and Paranoid Ideationimproving  Suicidal Thoughts:  No   Homicidal Thoughts:  No  Memory:  Immediate;   Fair Recent;   Fair Remote;   Fair  Judgement:  Fair  Insight:  Shallow  Psychomotor Activity:  Restlessness  Concentration:  Concentration: Fair and Attention Span: Fair  Recall:  [employer] of Knowledge:  Fair  Language:  Fair  Akathisia:  No  Handed:  Right  AIMS (if indicated):     Assets:  Desire for Improvement  ADL's:  Intact  Cognition:  WNL  Sleep:  Number of Hours: 1.75    Will continue today 09/24/16 plan as below except where it is noted.   Treatment Plan Summary: Patient remains with poor insight in to her illness , sleep as restless last night , her mania and delusions have improved now that she is on invega sustenna IM.  Continue Forced medications order - renewed 09/22/16 , valid for 7 days .    Daily contact with patient to assess and evaluate symptoms and progress in treatment, Medication management and Plan see below  Continue Invega sustenna - received 234 mg IM on 09/21/16 . Invega sustenna IM 156 mg on 09/26/16 , next  dose 117 mg on 10/20/16. Geodon 20 mg PO/10 mg IM prn for severe anxiety /agitation. Discontinue Lamictal for ADRs. Change Trazodone to 100 mg po qhs for sleep. Continue Tapazole 10 mg po bid for hyperthyroidism. Pt has been partially compliant on her medications . VS reviewed.  Propranolol 10 mg po bid for elevated HR. EKG reviewed - qtc - wnl, sinus tachycardia noted likely due to her thyroid sx, as well as medications offered. CSW will continue to work on disposition.Possible discharge Friday.    Veronica Barlett, MD 09/24/2016, 3:47 PM

## 2016-09-24 NOTE — Progress Notes (Signed)
MHT reported to writer that Pt wrote all over bathroom wall with pencil.

## 2016-09-24 NOTE — Progress Notes (Signed)
D: Pt presents with an animated affect and labile mood. Pt easily agitated and irritable throughout the day. Pt is verbally aggressive and sarcastic towards staff.  Pt is intrusive and requires redirecting from staff. Pt is paranoid about taking meds and refuses to take Lamictal as scheduled. Pt stated "I don't need meds. What are you trying to do to me". Pt continues to write down each med, the dose and what the med is given for. Pt stated "I need to let my mom know every med that you give me. Pt noted to have her personal belongings all over her room and on the floor. Pt noted to have written all over the bathroom walls. Pt noted to have voided in the toilet and would not flush it. Pt repeatedly reported several times to Probation officer and staff on the hall that there's dirt in the water pitcher in the dayroom and asked for the water to be changed. No dirt noted in the pitcher. A: Medications reviewed with pt. Medications administered as ordered per MD. Dr. Shea Evans made aware that pt is refusing to take Lamictal. Pt encouraged to perform ADL's. Verbal support provided. Pt redirected as needed. 15 minute checks performed for safety. R: Pt resistant to tx.

## 2016-09-24 NOTE — BHH Group Notes (Signed)
[place] LCSW Group Therapy  09/24/2016 2:54 PM   Type of Therapy:  Group Therapy  Participation Level:  Active  Participation Quality:  Attentive  Affect:  Appropriate  Cognitive:  Appropriate  Insight:  Improving  Engagement in Therapy:  Engaged  Modes of Intervention:  Clarification, Education, Exploration and Socialization  Summary of Progress/Problems: Today's group focused on resilience.  Veronica Perez did not attend group.  She was invited and stated that she wanted to attend but, never came to the group.   Roque Lias B 09/24/2016 , 2:54 PM

## 2016-09-25 DIAGNOSIS — R451 Restlessness and agitation: Secondary | ICD-10-CM

## 2016-09-25 DIAGNOSIS — I10 Essential (primary) hypertension: Secondary | ICD-10-CM

## 2016-09-25 DIAGNOSIS — R000 Tachycardia, unspecified: Secondary | ICD-10-CM

## 2016-09-25 DIAGNOSIS — E0590 Thyrotoxicosis, unspecified without thyrotoxic crisis or storm: Secondary | ICD-10-CM

## 2016-09-25 MED ORDER — ENSURE ENLIVE PO LIQD
237.0000 mL | Freq: Two times a day (BID) | ORAL | Status: DC
  Administered 2016-09-26 – 2016-09-27 (×3): 237 mL via ORAL

## 2016-09-25 NOTE — BHH Group Notes (Signed)
[facility] [employer] Group Therapy  09/25/2016 , 1:42 PM    Type of Therapy:  Mental Health Association Presentation  Participation Level:  Active  Participation Quality:  Attentive  Affect:  Blunted  Cognitive:  Oriented  Insight:  Limited  Engagement in Therapy:  Engaged  Modes of Intervention:  Discussion, Education and Socialization  Summary of Progress/Problems:  [name] from [place] came to present his recovery story.  [place] came to group late.  While in group she was engaged. She did not speak during group but did listen to the discussion.  Once she came to group she stayed for the remained of the time.    Roque Lias B 09/25/2016 , 1:42 PM

## 2016-09-25 NOTE — Progress Notes (Signed)
[facility] MD Progress Note  09/25/2016 12:08 PM Veronica Perez  MRN:  [id number]   Subjective: Pt states " I am taking all my medications and do not want take extra medications".   Objective: Patient seen and chart reviewed.Discussed patient with treatment team.  Patient has been suffering with racing thoughts, mood swings, pressured speech, delusional thoughts and trouble with plumbing when she was staying in an apartment. Patient states that she needs to do home schooling her 40 years old daughter etc. I have been chased by my family including parents and brother with pills and they don't give my personal space. Patient reports poor sleep last night. Patient has been reluctant with medication treatment and placed on forced medication order. She has been placed on LAI - Invega sustenna, next dose due this Friday. Plan to discharge her home on Friday after second dose.  Pt encouraged to attend groups and take medications.  Principal Problem: Bipolar disorder, current episode manic severe with psychotic features [facility]) Diagnosis:   Patient Active Problem List   Diagnosis Date Noted  . Bipolar disorder, current episode manic severe with psychotic features ([place]) [F31.2] 09/16/2016  . PTSD (post-traumatic stress disorder) [F43.10] 09/16/2016  . Panic disorder [F41.0] 09/16/2016  . Low TSH level [R94.6] 05/30/2016  . Heart palpitations [R00.2] 05/30/2016  . Bipolar I disorder, most recent episode (or current) manic ([place]) [F31.10] 12/22/2012  . 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 PANIC ATTACKS [F41.0] 04/02/2006  . TOBACCO DEPENDENCE [F17.200] 04/02/2006  . RHINITIS, ALLERGIC [J30.9] 04/02/2006  . GASTROESOPHAGEAL REFLUX, NO ESOPHAGITIS [K21.9] 04/02/2006   Total Time spent with patient: 30 minutes  Past Psychiatric History: Please see H&P and above collateral information from mother.   Past Medical History:  Past Medical History:  Diagnosis Date   . Bipolar disorder ([place])   . Depression   . Panic attacks     Past Surgical History:  Procedure Laterality Date  . ADENOIDECTOMY    . OVARIAN CYST SURGERY    . TONSILLECTOMY     Family History:  Family History  Problem Relation Age of Onset  . Diabetes Father   . Heart disease Father   . Cancer Maternal Grandmother        breast cancer  . Bipolar disorder Mother    Family Psychiatric  History: Please see H&P.  Social History: Please see H&P.  History  Alcohol Use No     History  Drug Use No    Social History   Social History  . Marital status: Single    Spouse name: N/A  . Number of children: N/A  . Years of education: N/A   Social History Main Topics  . Smoking status: Former Smoker    Quit date: 01/18/2012  . Smokeless tobacco: Never Used  . Alcohol use No  . Drug use: No  . Sexual activity: Not Asked   Other Topics Concern  . None   Social History Narrative  . None   Additional Social History:   Sleep: Poor  Appetite:  Fair  Current Medications: Current Facility-Administered Medications  Medication Dose Route Frequency Provider Last Rate Last Dose  . acetaminophen (TYLENOL) tablet 650 mg  650 mg Oral Q6H PRN [name], NP   650 mg at 09/22/16 7322  . alum & mag hydroxide-simeth (MAALOX/MYLANTA) 200-200-20 MG/5ML suspension 30 mL  30 mL Oral Q6H PRN [name], NP      . benztropine (COGENTIN) tablet 0.5  mg  0.5 mg Oral QHS [name], [name], MD   0.5 mg at 09/18/16 2223  . ibuprofen (ADVIL,MOTRIN) tablet 600 mg  600 mg Oral Q8H PRN [name], NP   600 mg at 09/24/16 6967  . LORazepam (ATIVAN) tablet 0.5 mg  0.5 mg Oral Q6H PRN [name], MD   0.5 mg at 09/25/16 8938   Or  . LORazepam (ATIVAN) injection 0.5 mg  0.5 mg Intramuscular Q6H PRN [name], MD   0.5 mg at 09/17/16 0737  . magnesium hydroxide (MILK OF MAGNESIA) suspension 30 mL  30 mL Oral Daily PRN [name], NP      . methimazole (TAPAZOLE) tablet 10  mg  10 mg Oral BID [name], NP   10 mg at 09/25/16 1017  . multivitamin with minerals tablet 1 tablet  1 tablet Oral Daily [name], NP   1 tablet at 09/25/16 [phone]  . ondansetron (ZOFRAN) tablet 4 mg  4 mg Oral Q8H PRN [name], NP      . Derrill Memo ON 09/26/2016] paliperidone (INVEGA SUSTENNA) injection 156 mg  156 mg Intramuscular Once [name], MD       Followed by  . [START ON 10/20/2016] Paliperidone Palmitate SUSP 117 mg  117 mg Intramuscular Q28 days [name], [name], MD      . propranolol (INDERAL) tablet 10 mg  10 mg Oral BID [name], MD   10 mg at 09/25/16 0810  . rOPINIRole (REQUIP) tablet 1 mg  1 mg Oral QHS PRN [name], MD      . traZODone (DESYREL) tablet 100 mg  100 mg Oral QHS [name], [name], MD      . ziprasidone (GEODON) capsule 20 mg  20 mg Oral Daily PRN [name], MD   20 mg at 09/18/16 1328   Or  . ziprasidone (GEODON) injection 10 mg  10 mg Intramuscular Daily PRN [name], MD        Lab Results:  No results found for this or any previous visit (from the past 55 hour(s)).  Blood Alcohol level:  Lab Results  Component Value Date   ETH <5 09/13/2016   ETH <5 58/52/7782    Metabolic Disorder Labs: Lab Results  Component Value Date   HGBA1C 4.9 09/17/2016   MPG 94 09/17/2016   Lab Results  Component Value Date   PROLACTIN 69.3 (H) 09/17/2016   Lab Results  Component Value Date   CHOL 134 09/17/2016   TRIG 92 09/17/2016   HDL 38 (L) 09/17/2016   CHOLHDL 3.5 09/17/2016   VLDL 18 09/17/2016   LDLCALC 78 09/17/2016    Physical Findings: AIMS: Facial and Oral Movements Muscles of Facial Expression: None, normal Lips and Perioral Area: None, normal Jaw: None, normal Tongue: None, normal,Extremity Movements Upper (arms, wrists, hands, fingers): None, normal Lower (legs, knees, ankles, toes): None, normal, Trunk Movements Neck, shoulders, hips: None, normal, Overall Severity Severity of abnormal  movements (highest score from questions above): None, normal Incapacitation due to abnormal movements: None, normal Patient's awareness of abnormal movements (rate only patient's report): No Awareness, Dental Status Current problems with teeth and/or dentures?: No Does patient usually wear dentures?: No  CIWA:    COWS:     Musculoskeletal: Strength & Muscle Tone: within normal limits Gait & Station: normal Patient leans: N/A  Psychiatric Specialty Exam: Physical Exam  Nursing note and vitals reviewed.   Review of Systems  Psychiatric/Behavioral: The patient is nervous/anxious.  All other systems reviewed and are negative.    Blood pressure 120/87, pulse 95, temperature 98 F (36.7 C), temperature source Oral, resp. rate 18, SpO2 98 %.There is no height or weight on file to calculate BMI.  General Appearance: Fairly Groomed  Eye Contact:  Fair  Speech:  Normal Rate  Volume:  varies  Mood:  Anxious and Irritable  Affect:  Congruent   Thought Process:  Linear and Descriptions of Associations: Intact  Orientation: alert  Thought Content:  Delusions and Paranoid Ideationimproving  Suicidal Thoughts:  No   Homicidal Thoughts:  No  Memory:  Immediate;   Fair Recent;   Fair Remote;   Fair  Judgement:  Fair  Insight:  Shallow  Psychomotor Activity:  Restlessness  Concentration:  Concentration: Fair and Attention Span: Fair  Recall:  [employer] of Knowledge:  Fair  Language:  Fair  Akathisia:  No  Handed:  Right  AIMS (if indicated):     Assets:  Desire for Improvement  ADL's:  Intact  Cognition:  WNL  Sleep:  Number of Hours: 6.5    Will continue today 09/25/16 plan as below except where it is noted.   Treatment Plan Summary: Patient remains with poor insight in to her illness , sleep as restless last night , her mania and delusions have improved now that she is on invega sustenna IM.  Continue Forced medications order - renewed 09/22/16 , valid for 7 days .  Daily  contact with patient to assess and evaluate symptoms and progress in treatment, Medication management and Plan see below  Continue treatment as planned: Continue Invega sustenna - received 234 mg IM on 09/21/16 . Invega sustenna IM 156 mg on 09/26/16 , next dose 117 mg on 10/20/16. Geodon 20 mg PO/10 mg IM prn for severe anxiety /agitation. Continue Trazodone 100 mg po qhs for sleep. Continue Tapazole 10 mg po bid for hyperthyroidism.  Patient has been partially compliant on her medications . VS reviewed. Propranolol 10 mg po bid for elevated HR. EKG reviewed - qtc - wnl, sinus tachycardia noted likely due to her thyroid sx, as well as medications offered. CSW will continue to work on disposition.Possible discharge Friday.   Ambrose Finland, MD 09/25/2016, 12:08 PM

## 2016-09-25 NOTE — Progress Notes (Signed)
DAR Note: Patient continues to be irrational, irritable and refusing her medication stated "I can only take my thyroid medicine. Y'all need to do what I ask you to do for me and not me doing what you want me to do. I am 41 years old for goodness sake". Patient continues to be needy and irritable. Needs constant redirection to stay on task. Staff offered support and encouraged patient to participate with the treatment plans. Med education offered. Routine safety checks maintained. Will continue to monitor patient.

## 2016-09-25 NOTE — Progress Notes (Signed)
Pt attempted to attend this evenings wrap up group, but was unable to sit through the group without feeling overwhelmed.

## 2016-09-25 NOTE — Progress Notes (Signed)
D: Pt presents with a flat affect and anxious mood. Pt less irritable on approach today. Pt compliant with taking her morning meds as scheduled. Pt continues to writer down each med, dose and the time it was given.  Pt mood is labile and unpredictable. Pt redirected as needed. Pt behaviors are bizarre at times. Pt called writer to her room to look in the toilet. Writer observed urine and toilet tissue in the toilet. Writer encouraged pt to flush the toilet and pt refused. Pt stated that she coughed something up out of her lungs. Pt room is untidy and pt refuses to clean up after herself. Pt has poor insight.  Pt speech is slower today and less pressured compared to yesterday. Pt thoughts are tangential.   A: Medications reviewed with pt. Medications administered as ordered per MD. Verbal support provided. Pt encouraged to color as a coping mechanism to help decrease worrying. 15 minute checks performed for safety. R: Pt compliant with tx today.

## 2016-09-26 MED ORDER — PALIPERIDONE PALMITATE 117 MG/0.75ML IM SUSP: 117.0000 mg | INTRAMUSCULAR | 0 refills | Status: DC

## 2016-09-26 MED ORDER — BENZTROPINE MESYLATE 0.5 MG PO TABS: 0.5000 mg | ORAL_TABLET | Freq: Every day | ORAL | 0 refills | Status: DC

## 2016-09-26 MED ORDER — ROPINIROLE HCL 1 MG PO TABS: 1.0000 mg | ORAL_TABLET | Freq: Every evening | ORAL | 0 refills | Status: DC | PRN

## 2016-09-26 MED ORDER — METHIMAZOLE 10 MG PO TABS: 10.0000 mg | ORAL_TABLET | Freq: Two times a day (BID) | ORAL | 0 refills | Status: DC

## 2016-09-26 MED ORDER — TRAZODONE HCL 100 MG PO TABS: 100.0000 mg | ORAL_TABLET | Freq: Every day | ORAL | 0 refills | Status: DC

## 2016-09-26 NOTE — [facility] Group Notes (Signed)
[place] LCSW Group Therapy  09/26/2016  1:05 PM  Type of Therapy:  Group therapy  Participation Level:  Active  Participation Quality:  Attentive  Affect:  Flat  Cognitive:  Oriented  Insight:  Limited  Engagement in Therapy:  Limited  Modes of Intervention:  Discussion, Socialization  Summary of Progress/Problems:  Chaplain was here to lead a group on themes of hope and courage. Invited.  Chose to not attend.  Trish Mage 09/26/2016 12:17 PM

## 2016-09-26 NOTE — Progress Notes (Signed)
Currently sitting on bed, leafing through magazines, states "I feel much better now".

## 2016-09-26 NOTE — Progress Notes (Signed)
D: Pt continue to be delusional, intrusive, and loud. Speech continue to be pressured, tangential and illogical. Pt continue to threaten law suit states, "you people have been putting poison in my body; nobody know who I know; all my lawyers are already on; this place and all the doctors will be closed down." Pt denied pain, anxiety, depression, SI, HI, AVH. A: Medications offered as prescribed. Pt given the opportunity to ask questions and state concerns. Support, encouragement, and safe environment provided. R: Pt was med compliant. All patient's questions and concerns addressed. 15-minute safety checks continue. Safety checks continue. Pt did not attend wrap-up group.

## 2016-09-26 NOTE — Tx Team (Signed)
Interdisciplinary Treatment and Diagnostic Plan Update  09/26/2016 Time of Session: 12:17 PM  Veronica Perez MRN: [id number]  Principal Diagnosis: Bipolar disorder, current episode manic severe with psychotic features [facility])  Secondary Diagnoses: Principal Problem:   Bipolar disorder, current episode manic severe with psychotic features ([place]) Active Problems:   PANIC ATTACKS   TOBACCO DEPENDENCE   PTSD (post-traumatic stress disorder)   Panic disorder   Current Medications:  Current Facility-Administered Medications  Medication Dose Route Frequency Provider Last Rate Last Dose  . acetaminophen (TYLENOL) tablet 650 mg  650 mg Oral Q6H PRN [name], NP   650 mg at 09/22/16 9371  . alum & mag hydroxide-simeth (MAALOX/MYLANTA) 200-200-20 MG/5ML suspension 30 mL  30 mL Oral Q6H PRN [name], NP      . benztropine (COGENTIN) tablet 0.5 mg  0.5 mg Oral QHS [name], [name], MD   0.5 mg at 09/18/16 2223  . feeding supplement (ENSURE ENLIVE) (ENSURE ENLIVE) liquid 237 mL  237 mL Oral BID BM [name], MD   237 mL at 09/26/16 1146  . ibuprofen (ADVIL,MOTRIN) tablet 600 mg  600 mg Oral Q8H PRN [name], NP   600 mg at 09/25/16 2138  . LORazepam (ATIVAN) tablet 0.5 mg  0.5 mg Oral Q6H PRN [name], MD   0.5 mg at 09/25/16 2137   Or  . LORazepam (ATIVAN) injection 0.5 mg  0.5 mg Intramuscular Q6H PRN [name], MD   0.5 mg at 09/17/16 0737  . magnesium hydroxide (MILK OF MAGNESIA) suspension 30 mL  30 mL Oral Daily PRN [name], NP      . methimazole (TAPAZOLE) tablet 10 mg  10 mg Oral BID [name], NP   10 mg at 09/26/16 1145  . multivitamin with minerals tablet 1 tablet  1 tablet Oral Daily [name], NP   1 tablet at 09/26/16 1146  . ondansetron (ZOFRAN) tablet 4 mg  4 mg Oral Q8H PRN [name], NP      . paliperidone (INVEGA SUSTENNA) injection 156 mg  156 mg Intramuscular Once [name], MD       Followed by  . [START ON  10/20/2016] Paliperidone Palmitate SUSP 117 mg  117 mg Intramuscular Q28 days [name], [name], MD      . propranolol (INDERAL) tablet 10 mg  10 mg Oral BID [name], MD   10 mg at 09/26/16 1145  . rOPINIRole (REQUIP) tablet 1 mg  1 mg Oral QHS PRN [name], MD      . traZODone (DESYREL) tablet 100 mg  100 mg Oral QHS [name], [name], MD   100 mg at 09/26/16 0108  . ziprasidone (GEODON) capsule 20 mg  20 mg Oral Daily PRN [name], [name], MD   20 mg at 09/18/16 1328   Or  . ziprasidone (GEODON) injection 10 mg  10 mg Intramuscular Daily PRN [name], MD        PTA Medications: Prescriptions Prior to Admission  Medication Sig Dispense Refill Last Dose  . acetaminophen (TYLENOL) 500 MG tablet Take 1,000 mg by mouth every 6 (six) hours as needed for mild pain, moderate pain, fever or headache.    uknown  . benztropine (COGENTIN) 1 MG tablet TAKE 1 TABLET BY MOUTH EVERYDAY AT BEDTIME  0 uknown  . [name] (EUCRISA) 2 % OINT Apply 1 application topically 2 (two) times daily.   uknown  . lamoTRIgine (LAMICTAL) 100 MG tablet Take 100  mg by mouth daily.  0 uknown  . methimazole (TAPAZOLE) 10 MG tablet Take 1 tablet (10 mg total) by mouth 2 (two) times daily. 60 tablet 1 uknown  . Multiple Vitamin (MULTIVITAMIN WITH MINERALS) TABS tablet Take 1 tablet by mouth daily.   09/13/2016 at Unknown time  . risperidone (RISPERDAL) 4 MG tablet    uknown  . rOPINIRole (REQUIP) 1 MG tablet TAKE 1 TABLET BY MOUTH EVERYDAY AT BEDTIME  0 uknown  . traZODone (DESYREL) 50 MG tablet Take 50 mg by mouth at bedtime as needed for sleep.   uknown    Patient Stressors: Marital or family conflict Medication change or noncompliance Other: Psychosis  Patient Strengths: Average or above average intelligence Physical Health Supportive family/friends  Treatment Modalities: Medication Management, Group therapy, Case management,  1 to 1 session with clinician, Psychoeducation, Recreational  therapy.   Physician Treatment Plan for Primary Diagnosis: Bipolar disorder, current episode manic severe with psychotic features ([place]) Long Term Goal(s): Improvement in symptoms so as ready for discharge  Short Term Goals: Ability to verbalize feelings will improve Ability to demonstrate self-control will improve Compliance with prescribed medications will improve Ability to verbalize feelings will improve Ability to demonstrate self-control will improve Compliance with prescribed medications will improve  Medication Management: Evaluate patient's response, side effects, and tolerance of medication regimen.  Therapeutic Interventions: 1 to 1 sessions, Unit Group sessions and Medication administration.  Evaluation of Outcomes: Progressing  Physician Treatment Plan for Secondary Diagnosis: Principal Problem:   Bipolar disorder, current episode manic severe with psychotic features ([place]) Active Problems:   PANIC ATTACKS   TOBACCO DEPENDENCE   PTSD (post-traumatic stress disorder)   Panic disorder   Long Term Goal(s): Improvement in symptoms so as ready for discharge  Short Term Goals: Ability to verbalize feelings will improve Ability to demonstrate self-control will improve Compliance with prescribed medications will improve Ability to verbalize feelings will improve Ability to demonstrate self-control will improve Compliance with prescribed medications will improve  Medication Management: Evaluate patient's response, side effects, and tolerance of medication regimen.  Therapeutic Interventions: 1 to 1 sessions, Unit Group sessions and Medication administration.  Evaluation of Outcomes: Progressing   8/20: Pt has not been very compliant with her thyroid medications . She does have hyperthyroidism and her mood sx also likely contributed by the same.  Continue Invega sustenna - received 234 mg IM on 09/21/16 . Invega sustenna IM 156 mg on 09/26/16 , next dose 117 mg on  10/20/16. Geodon 20 mg PO/10 mg IM prn for severe anxiety /agitation. Continue Lamictal restarted at 25 mg po daily for mood sx. Continue Requip 1 mg po qhs for RLS. Increase Trazodone to 100 mg po qhs for sleep issues. Pt however has been refusing on and off . Continue Tapazole 10 mg po bid for hyperthyroidism. Pt has been partially compliant on her medications . VS reviewed. Add Propranolol 10 mg po bid for elevated HR. EKG reviewed - qtc - wnl, sinus tachycardia noted likely due to her thyroid sx, as well as medications offered.  8/24: Patient remains with poor insight in to her illness , sleep as restless last night , her mania and delusions have improved now that she is on invega sustenna IM. Continues to be reluctant to take medications, including Tapezole  Continue Forced medications order - renewed 09/22/16 , valid for 7 days .  Continue Invega sustenna - received 234 mg IM on 09/21/16 . Invega sustenna IM 156 mg on 09/26/16 ,  next dose 117 mg on 10/20/16. Geodon 20 mg PO/10 mg IM prn for severe anxiety /agitation. Continue Trazodone 100 mg po qhs for sleep. Continue Tapazole 10 mg po bid for hyperthyroidism.  Patient has been partially compliant on her medications . VS reviewed. Propranolol 10 mg po bid for elevated HR.   RN Treatment Plan for Primary Diagnosis: Bipolar disorder, current episode manic severe with psychotic features ([place]) Long Term Goal(s): Knowledge of disease and therapeutic regimen to maintain health will improve  Short Term Goals: Ability to identify and develop effective coping behaviors will improve and Compliance with prescribed medications will improve  Medication Management: RN will administer medications as ordered by provider, will assess and evaluate patient's response and provide education to patient for prescribed medication. RN will report any adverse and/or side effects to prescribing provider.  Therapeutic Interventions: 1 on 1 counseling sessions,  Psychoeducation, Medication administration, Evaluate responses to treatment, Monitor vital signs and CBGs as ordered, Perform/monitor CIWA, COWS, AIMS and Fall Risk screenings as ordered, Perform wound care treatments as ordered.  Evaluation of Outcomes: Progressing   LCSW Treatment Plan for Primary Diagnosis: Bipolar disorder, current episode manic severe with psychotic features ([place]) Long Term Goal(s): Safe transition to appropriate next level of care at discharge, Engage patient in therapeutic group addressing interpersonal concerns.  Short Term Goals: Engage patient in aftercare planning with referrals and resources  Therapeutic Interventions: Assess for all discharge needs, 1 to 1 time with Social worker, Explore available resources and support systems, Assess for adequacy in community support network, Educate family and significant other(s) on suicide prevention, Complete Psychosocial Assessment, Interpersonal group therapy.  Evaluation of Outcomes: Met  Return home, follow up [facility]   Progress in Treatment: Attending groups: No Participating in groups: No Taking medication as prescribed: Yes Toleration medication: Yes, no side effects reported at this time Family/Significant other contact made: Let message for mother Patient understands diagnosis: No  Limited insight Discussing patient identified problems/goals with staff: Yes Medical problems stabilized or resolved: Yes Denies suicidal/homicidal ideation: Yes Issues/concerns per patient self-inventory: None Other: N/A  New problem(s) identified: Pt was seen yesterday for forced medication order.   New Short Term/Long Term Goal(s): "I'm not crazy. My family keeps putting me in the hospital.  They are the ones who are crazy."   Discharge Plan or Barriers:   Reason for Continuation of Hospitalization:   Medical issues-thyroid Medication stabilization   Estimated Length of Stay: 8/29  Attendees: Patient:  09/26/2016   12:17 PM  Physician: [name], MD 09/26/2016  12:17 PM  Nursing: Sena Hitch, RN 09/26/2016  12:17 PM  RN Care Manager: Lars Pinks, RN 09/26/2016  12:17 PM  Social Worker: Ripley Fraise 09/26/2016  12:17 PM  Recreational Therapist: Winfield Cunas 09/26/2016  12:17 PM  Other: Norberto Sorenson 09/26/2016  12:17 PM  Other:  09/26/2016  12:17 PM    Scribe for Treatment Team:  Roque Lias LCSW 09/26/2016 12:17 PM

## 2016-09-26 NOTE — Progress Notes (Signed)
Pacing in room, crying loudly, towel wrapped around head. "I just want to take a shower". Crying. Does not respond to questions about what is causing distress. Came to medication window requesting Ibuprofen, could not say where pain location was and rated pain a 20. Ibuprofen given, attempted to give other medications for thyroid and palpitations, as well as Geodon for severe agitation. Crying, repeated "I don't want to take any of that anymore. It gives me anxiety. It gives me insomnia. I'm withdrawing from the drugs you gave me. 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 I can't take that. These are my medications". Pulled out notebook that a nurse had written medications in. When told that's what these medications are, said "I can't take that. It makes me feel terrible. It gives me anxiety. It gives me insomnia". Provided reassurance and attempted to provide education several times about methimazole and its role in reducing thyroid hormone which should alleviate her anxiety and insomnia as her thyroid normalizes. Patient made several close attempts to bring medications to mouth but at this time has not taken medications. Eating in room.

## 2016-09-26 NOTE — Progress Notes (Signed)
[facility] MD Progress Note  09/26/2016 2:08 PM Veronica Perez  MRN:  [id number]   Subjective: Pt states " I just want to go home. ".   Objective: Patient seen and chart reviewed.Discussed patient with treatment team. Patient was schedule for discharge today, however appears to be regressing and is very disorganized. Upon observation she is observed lying in the bed and is somnolent, decrease muscle strength and flaccid. She was difficult to arouse, yet arose and responsive with a mild sternal rub. While obtaining vital signs she needed assistance to hold her arm for her blood pressure. Immediately thereafter she sat up in bed, grabbing her stomach and then ambulated to the rest room. She required no assistance to the toilet, once there she saw urine and toilet paper in the toilet and responded " look that came out of me when I used the bathroom" then she started pointing. Writer flushed the toilet and then she sat down and began to use the toilet. She was able to verbalize that she needed privacy. She then was heard crying from the nursing station and reports wanting to go home then.   Patient continues to suffer from disorganized thinking, refusal to take thyroid medication which can lead to her mania and psychosis. She varies between garbled and pressure speech, delusional thoughts. She is reminded of going home today and then returned to her state of confusion. Patient reports poor sleep last night. Patient has been reluctant with medication treatment and placed on forced medication order. She has been placed on LAI - Invega sustenna, next dose due this Saturday. As noted due to ongoing disorganized thoughts, somnolent and flaccidness will discontinue discharge.  Pt encouraged to attend groups and take medications.  Principal Problem: Bipolar disorder, current episode manic severe with psychotic features [facility]) Diagnosis:   Patient Active Problem List   Diagnosis Date Noted  . Bipolar disorder, current  episode manic severe with psychotic features ([place]) [F31.2] 09/16/2016  . PTSD (post-traumatic stress disorder) [F43.10] 09/16/2016  . Panic disorder [F41.0] 09/16/2016  . Low TSH level [R94.6] 05/30/2016  . Heart palpitations [R00.2] 05/30/2016  . Bipolar I disorder, most recent episode (or current) manic ([place]) [F31.10] 12/22/2012  . 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 PANIC ATTACKS [F41.0] 04/02/2006  . TOBACCO DEPENDENCE [F17.200] 04/02/2006  . RHINITIS, ALLERGIC [J30.9] 04/02/2006  . GASTROESOPHAGEAL REFLUX, NO ESOPHAGITIS [K21.9] 04/02/2006   Total Time spent with patient: 30 minutes  Past Psychiatric History: Please see H&P and above collateral information from mother.   Past Medical History:  Past Medical History:  Diagnosis Date  . Bipolar disorder ([place])   . Depression   . Panic attacks     Past Surgical History:  Procedure Laterality Date  . ADENOIDECTOMY    . OVARIAN CYST SURGERY    . TONSILLECTOMY     Family History:  Family History  Problem Relation Age of Onset  . Diabetes Father   . Heart disease Father   . Cancer Maternal Grandmother        breast cancer  . Bipolar disorder Mother    Family Psychiatric  History: Please see H&P.  Social History: Please see H&P.  History  Alcohol Use No     History  Drug Use No    Social History   Social History  . Marital status: Single    Spouse name: N/A  . Number of children: N/A  . Years of education: N/A   Social History  Main Topics  . Smoking status: Former Smoker    Quit date: 01/18/2012  . Smokeless tobacco: Never Used  . Alcohol use No  . Drug use: No  . Sexual activity: Not Asked   Other Topics Concern  . None   Social History Narrative  . None   Additional Social History:   Sleep: Poor  Appetite:  Fair  Current Medications: Current Facility-Administered Medications  Medication Dose Route Frequency Provider Last Rate Last Dose  .  acetaminophen (TYLENOL) tablet 650 mg  650 mg Oral Q6H PRN [name], NP   650 mg at 09/22/16 3267  . alum & mag hydroxide-simeth (MAALOX/MYLANTA) 200-200-20 MG/5ML suspension 30 mL  30 mL Oral Q6H PRN [name], NP      . benztropine (COGENTIN) tablet 0.5 mg  0.5 mg Oral QHS [name], [name], MD   0.5 mg at 09/18/16 2223  . feeding supplement (ENSURE ENLIVE) (ENSURE ENLIVE) liquid 237 mL  237 mL Oral BID BM [name], MD   237 mL at 09/26/16 1146  . ibuprofen (ADVIL,MOTRIN) tablet 600 mg  600 mg Oral Q8H PRN [name], NP   600 mg at 09/25/16 2138  . LORazepam (ATIVAN) tablet 0.5 mg  0.5 mg Oral Q6H PRN [name], MD   0.5 mg at 09/25/16 2137   Or  . LORazepam (ATIVAN) injection 0.5 mg  0.5 mg Intramuscular Q6H PRN [name], MD   0.5 mg at 09/17/16 0737  . magnesium hydroxide (MILK OF MAGNESIA) suspension 30 mL  30 mL Oral Daily PRN [name], NP      . methimazole (TAPAZOLE) tablet 10 mg  10 mg Oral BID [name], NP   10 mg at 09/26/16 1145  . multivitamin with minerals tablet 1 tablet  1 tablet Oral Daily [name], NP   1 tablet at 09/26/16 1146  . ondansetron (ZOFRAN) tablet 4 mg  4 mg Oral Q8H PRN [name], NP      . paliperidone (INVEGA SUSTENNA) injection 156 mg  156 mg Intramuscular Once [name], MD       Followed by  . [START ON 10/20/2016] Paliperidone Palmitate SUSP 117 mg  117 mg Intramuscular Q28 days [name], [name], MD      . propranolol (INDERAL) tablet 10 mg  10 mg Oral BID [name], MD   10 mg at 09/26/16 1145  . rOPINIRole (REQUIP) tablet 1 mg  1 mg Oral QHS PRN [name], MD      . traZODone (DESYREL) tablet 100 mg  100 mg Oral QHS [name], [name], MD   100 mg at 09/26/16 0108  . ziprasidone (GEODON) capsule 20 mg  20 mg Oral Daily PRN [name], MD   20 mg at 09/18/16 1328   Or  . ziprasidone (GEODON) injection 10 mg  10 mg Intramuscular Daily PRN [name], MD        Lab Results:   No results found for this or any previous visit (from the past 48 hour(s)).  Blood Alcohol level:  Lab Results  Component Value Date   [facility] <5 09/13/2016   ETH <5 12/45/8099    Metabolic Disorder Labs: Lab Results  Component Value Date   HGBA1C 4.9 09/17/2016   MPG 94 09/17/2016   Lab Results  Component Value Date   PROLACTIN 69.3 (H) 09/17/2016   Lab Results  Component Value Date   CHOL 134 09/17/2016   TRIG 92 09/17/2016  HDL 38 (L) 09/17/2016   CHOLHDL 3.5 09/17/2016   VLDL 18 09/17/2016   LDLCALC 78 09/17/2016    Physical Findings: AIMS: Facial and Oral Movements Muscles of Facial Expression: None, normal Lips and Perioral Area: None, normal Jaw: None, normal Tongue: None, normal,Extremity Movements Upper (arms, wrists, hands, fingers): None, normal Lower (legs, knees, ankles, toes): None, normal, Trunk Movements Neck, shoulders, hips: None, normal, Overall Severity Severity of abnormal movements (highest score from questions above): None, normal Incapacitation due to abnormal movements: None, normal Patient's awareness of abnormal movements (rate only patient's report): No Awareness, Dental Status Current problems with teeth and/or dentures?: No Does patient usually wear dentures?: No  CIWA:    COWS:     Musculoskeletal: Strength & Muscle Tone: within normal limits Gait & Station: normal Patient leans: N/A  Psychiatric Specialty Exam: Physical Exam  Nursing note and vitals reviewed.   Review of Systems  Psychiatric/Behavioral: The patient is nervous/anxious.   All other systems reviewed and are negative.    Blood pressure 108/63, pulse 96, temperature 98 F (36.7 C), temperature source Oral, resp. rate 20, SpO2 98 %.There is no height or weight on file to calculate BMI.  General Appearance: Fairly Groomed  Eye Contact:  Fair  Speech:  Garbled and Slow  Volume:  varies  Mood:  Dysphoric and Irritable  Affect:  Congruent   Thought Process:   Linear and Descriptions of Associations: Intact  Orientation: alert  Thought Content:  Delusions, Paranoid Ideation and Tangentialimproving  Suicidal Thoughts:  No   Homicidal Thoughts:  No  Memory:  Immediate;   Poor Recent;   Poor Remote;   Fair  Judgement:  Impaired  Insight:  Shallow  Psychomotor Activity:  Flacid, Psychomotor Retardation and Restlessness  Concentration:  Concentration: Poor and Attention Span: Poor  Recall:  [employer] of Knowledge:  Fair  Language:  Fair  Akathisia:  No  Handed:  Right  AIMS (if indicated):     Assets:  Desire for Improvement  ADL's:  Intact  Cognition:  WNL  Sleep:  Number of Hours: 3.25    Will continue today 09/26/16 plan as below except where it is noted.   Treatment Plan Summary: Patient remains with poor insight in to her illness , sleep as restless last night , her mania and delusions have improved now that she is on invega sustenna IM.  Continue Forced medications order - renewed 09/22/16 , valid for 7 days .  Daily contact with patient to assess and evaluate symptoms and progress in treatment, Medication management and Plan see below  Continue treatment as planned: Continue Invega sustenna - received 234 mg IM on 09/21/16 . Invega sustenna IM 156 mg on 09/27/16 , next dose 117 mg on 10/20/16. Geodon 20 mg PO/10 mg IM prn for severe anxiety /agitation. Continue Trazodone 100 mg po qhs for sleep. Continue Tapazole 10 mg po bid for hyperthyroidism.  Patient has been partially compliant on her medications . VS reviewed. Propranolol 10 mg po bid for elevated HR. EKG reviewed - qtc - wnl, sinus tachycardia noted likely due to her thyroid sx, as well as medications offered. CSW will continue to work on disposition.Possible discharge Monday.   Nanci Pina, FNP 09/26/2016, 2:08 PM

## 2016-09-26 NOTE — Progress Notes (Signed)
Patient in bed all morning, refusing to open eyes, refusing to take medications, would not respond verbally. Heard screaming from nurses station; in room in underclothes only yelling "I need to go home! I need to go home now". Brought the medications that were refused this AM (no psychotropics - thyroid and heart medication) and patient sobbed for 30 minutes, saying "I don't want it, I hate the way it makes me feel". Talked to patient at length about how her thyroid issue can make her feel very bad both physically and mentally. Took medications after 30 minutes of encouragement.

## 2016-09-27 ENCOUNTER — Encounter (HOSPITAL_COMMUNITY): Payer: Self-pay | Admitting: Psychiatry

## 2016-09-27 MED ORDER — LIDOCAINE-PRILOCAINE 2.5-2.5 % EX CREA
TOPICAL_CREAM | Freq: Once | CUTANEOUS | Status: AC
  Administered 2016-09-27: 12:00:00 via TOPICAL
  Filled 2016-09-27: qty 5

## 2016-09-27 NOTE — BHH Suicide Risk Assessment (Signed)
[facility] Discharge Suicide Risk Assessment   Principal Problem: Bipolar disorder, current episode manic severe with psychotic features [facility]) Discharge Diagnoses:  Patient Active Problem List   Diagnosis Date Noted  . Bipolar disorder, current episode manic severe with psychotic features ([place]) [F31.2] 09/16/2016  . PTSD (post-traumatic stress disorder) [F43.10] 09/16/2016  . Panic disorder [F41.0] 09/16/2016  . Low TSH level [R94.6] 05/30/2016  . Heart palpitations [R00.2] 05/30/2016  . Bipolar I disorder, most recent episode (or current) manic ([place]) [F31.10] 12/22/2012  . 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 PANIC ATTACKS [F41.0] 04/02/2006  . TOBACCO DEPENDENCE [F17.200] 04/02/2006  . RHINITIS, ALLERGIC [J30.9] 04/02/2006  . GASTROESOPHAGEAL REFLUX, NO ESOPHAGITIS [K21.9] 04/02/2006    Total Time spent with patient: 30 minutes  Musculoskeletal: Strength & Muscle Tone: within normal limits Gait & Station: normal Patient leans: N/A  Psychiatric Specialty Exam: Review of Systems  Psychiatric/Behavioral: Negative for depression and suicidal ideas. The patient is not nervous/anxious.   All other systems reviewed and are negative.   Blood pressure 129/74, pulse 79, temperature 98.2 F (36.8 C), temperature source Oral, resp. rate 18, SpO2 98 %.There is no height or weight on file to calculate BMI.  General Appearance: Fairly Groomed  Engineer, water::  Fair  Speech:  Normal Rate409  Volume:  Decreased  Mood:  Anxious improved  Affect:  Congruent  Thought Process:  Goal Directed and Descriptions of Associations: Intact  Orientation:  Full (Time, Place, and Person)  Thought Content:  Paranoid Ideation, improved  Suicidal Thoughts:  No  Homicidal Thoughts:  No  Memory:  Immediate;   Fair Recent;   Fair Remote;   Fair  Judgement:  Fair  Insight:  Fair  Psychomotor Activity:  Normal  Concentration:  Fair  Recall:  [employer] of  Knowledge:Fair  Language: Fair  Akathisia:  No  Handed:  Right  AIMS (if indicated):     Assets:  Communication Skills Desire for Improvement  Sleep:  Number of Hours: 3.25  Cognition: WNL  ADL's:  Intact   Mental Status Per Nursing Assessment::   On Admission:  NA  Demographic Factors:  Caucasian  Loss Factors: NA  Historical Factors: NA  Risk Reduction Factors:   Positive social support and Positive therapeutic relationship  Continued Clinical Symptoms:  Previous Psychiatric Diagnoses and Treatments  Cognitive Features That Contribute To Risk:  None    Suicide Risk:  Minimal: No identifiable suicidal ideation.  Patients presenting with no risk factors but with morbid ruminations; may be classified as minimal risk based on the severity of the depressive symptoms  Follow-up Information    [facility] Follow up on 09/30/2016.   Specialty:  Behavioral Health Why:  Patient accepted Transitional Care Team, services to begin on day of discharge.  Hospital discharge follow up appointment at [facility] on 8/28 at 1:15 PM.   Contact information: [place] [address] [place] [address] [phone]        Flossie Buffy, NP Follow up on 09/30/2016.   Specialty:  Internal Medicine Why:  Hospital discharge follow up [place] NP on 8/28 at 1:30 PM.  Please call to cancel/reschedule if needed.   Contact information: [address] [address] [phone]           Plan Of Care/Follow-up recommendations:  Activity:  no restrictions Diet:  regular Tests:  follow up on your TSH level Other:  Invega sustenna 156 mg IM - today 09/27/16 ,  next dose q28 days 117 mg on 10/20/16  Takoda Janowiak, MD 09/27/2016, 9:54 AM

## 2016-09-27 NOTE — BHH Group Notes (Signed)
[facility] Group Notes: (Clinical Social Work)   09/27/2016      Type of Therapy:  Group Therapy   Participation Level:  Did Not Attend despite MHT prompting   Selmer Dominion, LCSW 09/27/2016, 2:08 PM

## 2016-09-27 NOTE — Social Work (Signed)
Clinical Social Work Note  At staff request, CSW called mother Veronica Perez [phone] with written consent.  Discussed with her the importance of ensuring that pt comply with both her medical and mental health medications.  Date of next injection was shared with mother.  She stated she has really missed pt and is glad she is coming home, has no concerns.  We briefly discussed her being followed by Transitional Care Team.  The weekend number for that team was sought through calling [facility], but was not found to supply to family.  Her follow-up plan was also shared with mother.  Mother will arrive this afternoon to pick up pt.  Selmer Dominion, LCSW 09/27/2016, 12:40 PM
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
## 2016-09-27 NOTE — Progress Notes (Signed)
Patient to discharged to mother in the lobby. All discharge paperwork given and signed, belongings returned. Patient signed discharge instructions but refused to sign belongings sheet. Black boots on belongings sheet returned.  Prescriptions given to mother and reviewed with patient and mother. Both verbalized understanding, patient stated "I'm not taking anymore medications I don't like the way they make me feel" Mother replied "I know baby". Patient denies SI/HI/AVH.

## 2016-09-27 NOTE — Progress Notes (Signed)
Several times pt wandered up the hall requesting to take her meds. However, once in her hand pt would stall and ask questions. Pt attempted to walk away with the medications and other staff intervened. At times pt began to speak loudly in the hall, stating "I want to go home, my family needs me". Pt informed the writer that she was experiencing "heart palpitations". Vital signs were taken and documented by the mht.  At apprx 2315 pt agreed to take her scheduled meds that were due at 1700 as well as some prns.

## 2016-09-27 NOTE — Progress Notes (Addendum)
  [facility] Adult Case Management Discharge Plan :  Will you be returning to the same living situation after discharge:  Yes,  mother At discharge, do you have transportation home?: Yes,  family Do you have the ability to pay for your medications: Yes,  insurance and income  Release of information consent forms completed and turned in to Medical Records.  Patient to Follow up at: Follow-up Information    [facility] Follow up on 09/30/2016.   Specialty:  Behavioral Health Why:  Patient accepted Transitional Care Team, services to begin on day of discharge.  Hospital discharge follow up appointment at [facility] on 8/28 at 1:15 PM. Patient notified of new appt time by voice mail at [place] information: [place] [address] [phone]        Flossie Buffy, NP Follow up on 10/02/2016.   Specialty:  Internal Medicine Why:  Hospital discharge follow up with Wilfred Lacy NP on 8/30 at 1:00 PM.   Contact information: [address] [address] [place] [address] [phone]           Next level of care provider has access to [facility] and Suicide Prevention discussed: No.  No suicide at admission or during.  Have you used any form of tobacco in the last 30 days? (Cigarettes, Smokeless Tobacco, Cigars, and/or Pipes): No  Has patient been referred to the Quitline?: N/A patient is not a smoker  Patient has been referred for addiction treatment: [place], LCSW 09/29/2016, 10:08 AM

## 2016-09-27 NOTE — Progress Notes (Signed)
  D: Pt's mother was visiting at the beginning of the shift. Pt's room was in a disarray and she was pacing back and forth. Pt was waving and shaking her hands, stating that she was anxious. Writer attempted to give pt her scheduled meds that she refused at 1700. Pt refused stating that all meds make her nervous. Pt's mother also encouraged pt to take her meds. Pt refused and experienced increased agitation when speaking to her mother. Pt has no questions or concerns.   A:  Support and encouragement was offered. 15 min checks continued for safety.  R: Pt remains safe.

## 2016-09-29 ENCOUNTER — Other Ambulatory Visit: Payer: Self-pay

## 2016-09-29 ENCOUNTER — Emergency Department (HOSPITAL_COMMUNITY)
Admission: EM | Admit: 2016-09-29 | Discharge: 2016-09-30 | Disposition: A | Discharge status: Other Acute Inpatient Hospital | Payer: Medicare Other | Attending: Emergency Medicine | Admitting: Emergency Medicine

## 2016-09-29 ENCOUNTER — Encounter (HOSPITAL_COMMUNITY): Payer: Self-pay | Admitting: *Deleted

## 2016-09-29 DIAGNOSIS — F3110 Bipolar disorder, current episode manic without psychotic features, unspecified: Secondary | ICD-10-CM | POA: Diagnosis not present

## 2016-09-29 DIAGNOSIS — R443 Hallucinations, unspecified: Secondary | ICD-10-CM | POA: Diagnosis not present

## 2016-09-29 DIAGNOSIS — F312 Bipolar disorder, current episode manic severe with psychotic features: Secondary | ICD-10-CM | POA: Diagnosis not present

## 2016-09-29 DIAGNOSIS — Z79899 Other long term (current) drug therapy: Secondary | ICD-10-CM | POA: Diagnosis not present

## 2016-09-29 DIAGNOSIS — R450 Nervousness: Secondary | ICD-10-CM | POA: Diagnosis not present

## 2016-09-29 DIAGNOSIS — F410 Panic disorder [episodic paroxysmal anxiety] without agoraphobia: Secondary | ICD-10-CM

## 2016-09-29 DIAGNOSIS — Z87891 Personal history of nicotine dependence: Secondary | ICD-10-CM | POA: Diagnosis not present

## 2016-09-29 DIAGNOSIS — F4310 Post-traumatic stress disorder, unspecified: Secondary | ICD-10-CM

## 2016-09-29 DIAGNOSIS — F419 Anxiety disorder, unspecified: Secondary | ICD-10-CM

## 2016-09-29 DIAGNOSIS — G479 Sleep disorder, unspecified: Secondary | ICD-10-CM | POA: Insufficient documentation

## 2016-09-29 DIAGNOSIS — F411 Generalized anxiety disorder: Secondary | ICD-10-CM | POA: Diagnosis not present

## 2016-09-29 DIAGNOSIS — F22 Delusional disorders: Secondary | ICD-10-CM | POA: Diagnosis not present

## 2016-09-29 DIAGNOSIS — Z9114 Patient's other noncompliance with medication regimen: Secondary | ICD-10-CM | POA: Insufficient documentation

## 2016-09-29 DIAGNOSIS — Z818 Family history of other mental and behavioral disorders: Secondary | ICD-10-CM | POA: Diagnosis not present

## 2016-09-29 HISTORY — DX: Disorder of thyroid, unspecified: E07.9

## 2016-09-29 LAB — BASIC METABOLIC PANEL
ANION GAP: 9 (ref 5–15)
BUN: 9 mg/dL (ref 6–20)
CHLORIDE: 105 mmol/L (ref 101–111)
CO2: 24 mmol/L (ref 22–32)
Calcium: 9 mg/dL (ref 8.9–10.3)
Creatinine, Ser: 0.6 mg/dL (ref 0.44–1.00)
GFR calc Af Amer: >60 mL/min (ref >60)
GFR calc non Af Amer: >60 mL/min (ref >60)
Glucose, Bld: 89 mg/dL (ref 65–99)
POTASSIUM: 3.6 mmol/L (ref 3.5–5.1)
SODIUM: 138 mmol/L (ref 135–145)

## 2016-09-29 LAB — CBC WITH DIFFERENTIAL/PLATELET
BASOS ABS: 0 10*3/uL (ref 0.0–0.1)
Basophils Relative: 0 %
EOS ABS: 0.1 10*3/uL (ref 0.0–0.7)
EOS PCT: 1 %
HCT: 37.9 % (ref 36.0–46.0)
Hemoglobin: 12.9 g/dL (ref 12.0–15.0)
Lymphocytes Relative: 30 %
Lymphs Abs: 3.2 10*3/uL (ref 0.7–4.0)
MCH: 27 pg (ref 26.0–34.0)
MCHC: 34 g/dL (ref 30.0–36.0)
MCV: 79.3 fL (ref 78.0–100.0)
MONO ABS: 0.8 10*3/uL (ref 0.1–1.0)
Monocytes Relative: 7 %
Neutro Abs: 6.7 10*3/uL (ref 1.7–7.7)
Neutrophils Relative %: 62 %
PLATELETS: 275 10*3/uL (ref 150–400)
RBC: 4.78 MIL/uL (ref 3.87–5.11)
RDW: 13.7 % (ref 11.5–15.5)
WBC: 10.7 10*3/uL — AB (ref 4.0–10.5)

## 2016-09-29 LAB — RAPID URINE DRUG SCREEN, HOSP PERFORMED
AMPHETAMINES: NOT DETECTED
BENZODIAZEPINES: NOT DETECTED
Barbiturates: NOT DETECTED
COCAINE: NOT DETECTED
OPIATES: NOT DETECTED
Tetrahydrocannabinol: NOT DETECTED

## 2016-09-29 LAB — ETHANOL

## 2016-09-29 MED ORDER — STERILE WATER FOR INJECTION IJ SOLN
INTRAMUSCULAR | Status: AC
  Filled 2016-09-29: qty 10

## 2016-09-29 MED ORDER — ACETAMINOPHEN 325 MG PO TABS
650.0000 mg | ORAL_TABLET | Freq: Four times a day (QID) | ORAL | Status: DC | PRN
  Administered 2016-09-29: 650 mg via ORAL
  Filled 2016-09-29: qty 2

## 2016-09-29 MED ORDER — METHIMAZOLE 10 MG PO TABS
10.0000 mg | ORAL_TABLET | Freq: Two times a day (BID) | ORAL | Status: DC
  Administered 2016-09-29 – 2016-09-30 (×2): 10 mg via ORAL
  Filled 2016-09-29 (×4): qty 1

## 2016-09-29 MED ORDER — ZIPRASIDONE MESYLATE 20 MG IM SOLR
20.0000 mg | Freq: Once | INTRAMUSCULAR | Status: AC
  Administered 2016-09-29: 20 mg via INTRAMUSCULAR
  Filled 2016-09-29: qty 20

## 2016-09-29 MED ORDER — METHIMAZOLE 10 MG PO TABS
10.0000 mg | ORAL_TABLET | Freq: Two times a day (BID) | ORAL | Status: DC
  Administered 2016-09-29: 10 mg via ORAL
  Filled 2016-09-29: qty 1

## 2016-09-29 MED ORDER — BLISTEX MEDICATED EX OINT
TOPICAL_OINTMENT | CUTANEOUS | Status: DC | PRN
  Administered 2016-09-29: 05:00:00 via TOPICAL
  Filled 2016-09-29: qty 7

## 2016-09-29 MED ORDER — BENZTROPINE MESYLATE 1 MG PO TABS
1.0000 mg | ORAL_TABLET | Freq: Every day | ORAL | Status: DC
  Administered 2016-09-29: 1 mg via ORAL
  Filled 2016-09-29: qty 1

## 2016-09-29 MED ORDER — ZIPRASIDONE HCL 20 MG PO CAPS
20.0000 mg | ORAL_CAPSULE | Freq: Two times a day (BID) | ORAL | Status: DC
  Administered 2016-09-29 – 2016-09-30 (×2): 20 mg via ORAL
  Filled 2016-09-29 (×3): qty 1

## 2016-09-29 MED ORDER — OLANZAPINE 10 MG IM SOLR
10.0000 mg | Freq: Once | INTRAMUSCULAR | Status: AC
  Administered 2016-09-29: 10 mg via INTRAMUSCULAR
  Filled 2016-09-29: qty 10

## 2016-09-29 MED ORDER — ROPINIROLE HCL 1 MG PO TABS
1.0000 mg | ORAL_TABLET | Freq: Every evening | ORAL | Status: DC | PRN
  Filled 2016-09-29: qty 1

## 2016-09-29 NOTE — ED Notes (Addendum)
Per sheriff's department, pt cannot be transported to [place] until tomorrow morning at approx 8 or 9 AM. Pt and family aware.

## 2016-09-29 NOTE — Consult Note (Signed)
Telepsych Consultation   Reason for Consult: Paranoid behavior Referring Physician: EDP Location of Patient: [facility] ED Location of Provider: [facility]  Patient Identification: ETRULIA ZARR MRN:  [id number] Principal Diagnosis: <principal problem not specified> Diagnosis:   Patient Active Problem List   Diagnosis Date Noted  . Bipolar disorder, current episode manic severe with psychotic features ([place]) [F31.2] 09/16/2016  . PTSD (post-traumatic stress disorder) [F43.10] 09/16/2016  . Panic disorder [F41.0] 09/16/2016  . Low TSH level [R94.6] 05/30/2016  . Heart palpitations [R00.2] 05/30/2016  . Bipolar I disorder, most recent episode (or current) manic ([place]) [F31.10] 12/22/2012  . 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 PANIC ATTACKS [F41.0] 04/02/2006  . TOBACCO DEPENDENCE [F17.200] 04/02/2006  . RHINITIS, ALLERGIC [J30.9] 04/02/2006  . GASTROESOPHAGEAL REFLUX, NO ESOPHAGITIS [K21.9] 04/02/2006    Total Time spent with patient: 30 minutes  Subjective:   DONNIKA KUCHER is a 41 y.o. female patient admitted with Bipolar I D/O; Delusional D/O; GAD.  HPI: Per the intake assessment completed on 09/29/16 by Lind Covert: [name] is an 41 y.o. female who presents to the ED voluntarily due to not taking her psych meds not reportedly not being able to sleep since being d/c from [facility] on [facility]. During the assessment, the pt appeared to express tangential thought patterns. Pt stated "my family, they just keep putting me in hospitals for the past 23 years. I can't enjoy my life. My daughter is addicted to her video games. My dad is addicted to the TV. It's high definition but the definition is too much." Pt stated "I saved a woman's life. I just need help. Help me please. I'm holding out my arms to get a hug. Please. Help me." Pt reports her family "chases her around with pills." Pt states she does not want to take the medication  and states she has not seen her OPT provider Dr. Reece Levy in several months.   Pt expressing delusions and states that she does not need to be in the hospital and that her family members are the ones that are suicidal. Pt stated "I need emergency housing. I don't want to live there. My mom is taking my food stamp card and my benefits to take care of my daughter. I need emergency housing for me and my daughter. Help me please." Pt stated "I don't want nurse Velva Harman near my food. She tampers with it and puts cleaning supplies in my food." Pt stated "I need a live in nurse to take care of me and my family."   Pt appeared disheveled during the assessment. Pt continued playing with her hair and saying "help me" during the assessment. Pt has extensive hx of ED visits and inpt hospitalizations c/o similar concerns. Pt continues to state that her family is addicted to devices and she does not want to be near them. Pt reports a hx of severe anxiety and panic attacks.   On my tele psych evaluation today, 09/29/16: Patient was seen via tele-psych, chart reviewed with treatment team. Patient in bed, awake, alert and oriented x4. Patient reiterated the reason for this hospital admission as documented above. Patient stated, "Me and my daughter need an emergency shelter. My mom and dad don't pay attention to [place]. I used to pamper myself but now, I can't take care of myself anymore. My family always want to put me in the hospital and they chase me around and terrorize me with pills. I have  to call the police several times to protect myself. I don't have any bed at home, I need an emergency shelter. My stuff is in the storage in boxes. I'm scared of my life, I'm too weak and I can't take care for myself". Patient kept repeating these words over and over, she continues to exhibit loose associations. Patient was also touching her hair and telling this writer to take a look at her hair, that she is no longer able to care for herself.  Patient denies any SI/HI/VAH. Patient appears to be paranoid about her family, stating that she is afraid of them and that she needs a live-in nurse to help care for her.   Past Psychiatric History: See H&P  Risk to Self: Suicidal Ideation: No Suicidal Intent: No Is patient at risk for suicide?: No Suicidal Plan?: No Access to Means: No What has been your use of drugs/alcohol within the last 12 months?: denies use Triggers for Past Attempts: None known Intentional Self Injurious Behavior: None Risk to Others: Homicidal Ideation: No Thoughts of Harm to Others: No-Not Currently Present/Within Last 6 Months Current Homicidal Intent: No Current Homicidal Plan: No Access to Homicidal Means: No History of harm to others?: No Assessment of Violence: None Noted Does patient have access to weapons?: No Criminal Charges Pending?: No Does patient have a court date: No Prior Inpatient Therapy: Prior Inpatient Therapy: Yes Prior Therapy Dates: 2018, 2014, 2009 Prior Therapy Facilty/Provider(s): [place], [place] Reason for Treatment: BIPOLAR D/O Prior Outpatient Therapy: Prior Outpatient Therapy: Yes Prior Therapy Dates: CURRENT Prior Therapy Facilty/Provider(s): DR. Reece Levy Reason for Treatment: MED MANAGEMENT  Does patient have an ACCT team?: No Does patient have Intensive In-House Services?  : No Does patient have [facility] services? : No Does patient have P4CC services?: No  Past Medical History:  Past Medical History:  Diagnosis Date  . Bipolar disorder ([place])   . Depression   . Panic attacks   . Thyroid disease     Past Surgical History:  Procedure Laterality Date  . ADENOIDECTOMY    . OVARIAN CYST SURGERY    . TONSILLECTOMY     Family History:  Family History  Problem Relation Age of Onset  . Diabetes Father   . Heart disease Father   . Cancer Maternal Grandmother        breast cancer  . Bipolar disorder Mother    Family Psychiatric  History:   Social History:   History  Alcohol Use No     History  Drug Use No    Social History   Social History  . Marital status: Single    Spouse name: N/A  . Number of children: N/A  . Years of education: N/A   Social History Main Topics  . Smoking status: Former Smoker    Quit date: 01/18/2012  . Smokeless tobacco: Never Used  . Alcohol use No  . Drug use: No  . Sexual activity: Not Asked   Other Topics Concern  . None   Social History Narrative  . None   Additional Social History:    Allergies:   Allergies  Allergen Reactions  . Penicillins Hives, Itching, Nausea And Vomiting and Other (See Comments)    Has patient had a PCN reaction causing immediate rash, facial/tongue/throat swelling, SOB or lightheadedness with hypotension: No Has patient had a PCN reaction causing severe rash involving mucus membranes or skin necrosis: No Has patient had a PCN reaction that required hospitalization: No Has patient had a  PCN reaction occurring within the last 10 years: No If all of the above answers are "NO", then may proceed with Cephalosporin use.    Labs:  Results for orders placed or performed during the hospital encounter of 09/29/16 (from the past 48 hour(s))  Ethanol     Status: None   Collection Time: 09/29/16  3:26 AM  Result Value Ref Range   Alcohol, Ethyl (B) <5 <5 mg/dL    Comment:        LOWEST DETECTABLE LIMIT FOR SERUM ALCOHOL IS 5 mg/dL FOR MEDICAL PURPOSES ONLY   CBC with Differential     Status: Abnormal   Collection Time: 09/29/16  3:43 AM  Result Value Ref Range   WBC 10.7 (H) 4.0 - 10.5 K/uL   RBC 4.78 3.87 - 5.11 MIL/uL   Hemoglobin 12.9 12.0 - 15.0 g/dL   HCT 37.9 36.0 - 46.0 %   MCV 79.3 78.0 - 100.0 fL   MCH 27.0 26.0 - 34.0 pg   MCHC 34.0 30.0 - 36.0 g/dL   RDW 13.7 11.5 - 15.5 %   Platelets 275 150 - 400 K/uL   Neutrophils Relative % 62 %   Neutro Abs 6.7 1.7 - 7.7 K/uL   Lymphocytes Relative 30 %   Lymphs Abs 3.2 0.7 - 4.0 K/uL   Monocytes Relative 7 %    Monocytes Absolute 0.8 0.1 - 1.0 K/uL   Eosinophils Relative 1 %   Eosinophils Absolute 0.1 0.0 - 0.7 K/uL   Basophils Relative 0 %   Basophils Absolute 0.0 0.0 - 0.1 K/uL  Basic metabolic panel     Status: None   Collection Time: 09/29/16  3:43 AM  Result Value Ref Range   Sodium 138 135 - 145 mmol/L   Potassium 3.6 3.5 - 5.1 mmol/L   Chloride 105 101 - 111 mmol/L   CO2 24 22 - 32 mmol/L   Glucose, Bld 89 65 - 99 mg/dL   BUN 9 6 - 20 mg/dL   Creatinine, Ser 0.60 0.44 - 1.00 mg/dL   Calcium 9.0 8.9 - 10.3 mg/dL   GFR calc non Af Amer >60 >60 mL/min   GFR calc Af Amer >60 >60 mL/min    Comment: (NOTE) The eGFR has been calculated using the CKD EPI equation. This calculation has not been validated in all clinical situations. eGFR's persistently <60 mL/min signify possible Chronic Kidney Disease.    Anion gap 9 5 - 15  Rapid urine drug screen (hospital performed)     Status: None   Collection Time: 09/29/16  5:47 AM  Result Value Ref Range   Opiates NONE DETECTED NONE DETECTED   Cocaine NONE DETECTED NONE DETECTED   Benzodiazepines NONE DETECTED NONE DETECTED   Amphetamines NONE DETECTED NONE DETECTED   Tetrahydrocannabinol NONE DETECTED NONE DETECTED   Barbiturates NONE DETECTED NONE DETECTED    Comment:        DRUG SCREEN FOR MEDICAL PURPOSES ONLY.  IF CONFIRMATION IS NEEDED FOR ANY PURPOSE, NOTIFY LAB WITHIN 5 DAYS.        LOWEST DETECTABLE LIMITS FOR URINE DRUG SCREEN Drug Class       Cutoff (ng/mL) Amphetamine      1000 Barbiturate      200 Benzodiazepine   324 Tricyclics       401 Opiates          300 Cocaine          300 THC  50     Medications:  Current Facility-Administered Medications  Medication Dose Route Frequency Provider Last Rate Last Dose  . lip balm (BLISTEX) ointment   Topical PRN Horton, Barbette Hair, MD      . methimazole (TAPAZOLE) tablet 10 mg  10 mg Oral BID Dina Rich, Barbette Hair, MD   10 mg at 09/29/16 0907  . sterile water  (preservative free) injection            Current Outpatient Prescriptions  Medication Sig Dispense Refill  . benztropine (COGENTIN) 0.5 MG tablet Take 1 tablet (0.5 mg total) by mouth at bedtime. 30 tablet 0  . benztropine (COGENTIN) 1 MG tablet Take 1 mg by mouth at bedtime.    . hydrOXYzine (VISTARIL) 25 MG capsule Take 25 mg by mouth every 6 (six) hours as needed for anxiety.    . methimazole (TAPAZOLE) 10 MG tablet Take 1 tablet (10 mg total) by mouth 2 (two) times daily. 60 tablet 0  . rOPINIRole (REQUIP) 1 MG tablet Take 1 tablet (1 mg total) by mouth at bedtime as needed (rls). (Patient taking differently: Take 1 mg by mouth at bedtime. ) 30 tablet 0  . traZODone (DESYREL) 100 MG tablet Take 1 tablet (100 mg total) by mouth at bedtime. 30 tablet 0  . [START ON 10/20/2016] Paliperidone Palmitate (INVEGA SUSTENNA) 117 MG/0.75ML SUSP Inject 117 mg into the muscle every 28 (twenty-eight) days. (Patient not taking: Reported on 09/29/2016) 0.9 mL 0    Musculoskeletal: UTA via camera  Psychiatric Specialty Exam: Physical Exam  Nursing note and vitals reviewed.   Review of Systems  Psychiatric/Behavioral: Positive for depression and hallucinations. Negative for memory loss, substance abuse and suicidal ideas. The patient is nervous/anxious. The patient does not have insomnia.   All other systems reviewed and are negative.   Blood pressure 134/80, pulse (!) 104, temperature 99 F (37.2 C), temperature source Oral, resp. rate 18, height 5' 4" (1.626 m), weight 85.7 kg (189 lb), SpO2 96 %.Body mass index is 32.44 kg/m.  General Appearance: on hospital scrub  Eye Contact:  Fair  Speech:  Pressured  Volume:  Normal  Mood:  Euphoric  Affect:  Congruent  Thought Process:  Irrelevant and Descriptions of Associations: Loose  Orientation:  Full (Time, Place, and Person)  Thought Content:  Illogical, Ideas of Reference:   Paranoia, Paranoid Ideation and Abstract Reasoning  Suicidal Thoughts:   No  Homicidal Thoughts:  No  Memory:  Immediate;   Fair Recent;   Fair Remote;   Fair  Judgement:  Impaired  Insight:  Negative  Psychomotor Activity:  Increased and Restlessness  Concentration:  Concentration: Poor and Attention Span: Poor  Recall:  Poor  Fund of Knowledge:  Fair  Language:  Good  Akathisia:  Yes  Handed:  Right  AIMS (if indicated):     Assets:  Desire for Improvement Financial Resources/Insurance Physical Health Social Support  ADL's:  Intact  Cognition:  WNL  Sleep:        Treatment Plan Summary: Daily contact with patient to assess and evaluate symptoms and progress in treatment, Medication management and Plan admit to psychiatric inpatient for psychosis  Restart Home Meds as follows: -Methimazole 10 mg po daily for thyroid preplacement -Requip 1 mg po daily for restlessness -Cogentin 1 mg po daily for eps prevention  New order -Geodon 20 mg po daily for psychosis  Disposition: Recommend psychiatric Inpatient admission when medically cleared.  This service was provided via telemedicine using  a 2-way, interactive audio and Radiographer, therapeutic.  Names of all persons participating in this telemedicine service and their role in this encounter. Name: Aerilyn Slee Role: Patient  Name: Hughie Closs NP Role: Provider          Vicenta Aly, NP 09/29/2016 10:37 AM

## 2016-09-29 NOTE — Progress Notes (Signed)
Patient has been accepted to [facility]. Accepting/attending physician is Dr. Geanie Cooley.   Number for reports is [phone].  Bed is available, patient can transport at anytime.     Raynelle Bring, RN notified.    Radonna Ricker MSW, [place] Disposition [phone]

## 2016-09-29 NOTE — ED Notes (Signed)
TTS in progress 

## 2016-09-29 NOTE — ED Notes (Signed)
Pt disconnected herself from monitor again, pt walked to BR with steady gait after putting clothing on over purple scrubs. Urine sample obtained.

## 2016-09-29 NOTE — Progress Notes (Signed)
Patient meets criteria for inpatient treatment. CSW faxed referrals to the following inpatient facilities for review:  [facility], [facility], [facility], [place], [place] [facility]   TTS will continue to seek bed placement.   Radonna Ricker MSW, [place] Disposition [phone]

## 2016-09-29 NOTE — ED Notes (Signed)
Pt eating [place] sandwich reporting that she is hungry stating "my mom and dad have been taking my food stamps card, cant you see I am hungry". This Probation officer responded telling the patient that we will also order her a meal tray. Meal tray already ordered by NT.

## 2016-09-29 NOTE — ED Notes (Signed)
Pt continues to wander in hall. Pt continues to state she wants to call her mother and father at this time.

## 2016-09-29 NOTE — ED Notes (Signed)
TTS in process 

## 2016-09-29 NOTE — ED Notes (Signed)
Pt resting on stretcher with eyes closed, RR even and unlabored, CCM in place. Will continue to monitor

## 2016-09-29 NOTE — ED Notes (Signed)
Dinner tray at bedside

## 2016-09-29 NOTE — ED Notes (Addendum)
Pt family updated to plan of care. Pt family upset about distance to facility. This nurse provided information per pt request. Family spoken to for approx 20 minutes, pt family verbalized understanding pt continued to question why "she can't go to [facility] again, or somewhere closer." Explained the process of placement and family reported they understood but was still frustrated.

## 2016-09-29 NOTE — ED Notes (Signed)
Dr. Dina Rich at bedside discussing dispo with TTS

## 2016-09-29 NOTE — ED Notes (Signed)
TTS recommends AM psych eval

## 2016-09-29 NOTE — ED Notes (Signed)
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

## 2016-09-29 NOTE — ED Notes (Signed)
Ordered breakfast tray  

## 2016-09-29 NOTE — ED Notes (Signed)
ED Provider at bedside. 

## 2016-09-29 NOTE — ED Notes (Signed)
Pt sitting up eating breakfast tray 

## 2016-09-29 NOTE — ED Notes (Signed)
Pt loudly speaking "I want a doctor or a nurse in here." Upon assessment, pt dumped all her meds on the bed and states "I can't be taking all this shit. I'm obsessed with television and watch it all the time. But I haven't been taking them." Pt states "I feel like I'm hemorrhaging." When attempting to ask pt why she feels that way, pt states "I feel it."

## 2016-09-29 NOTE — ED Notes (Signed)
Sitter at bedside.

## 2016-09-29 NOTE — ED Notes (Signed)
Pt walking in halls demanding to leave, pt states she will not stay and will wait in the lobby.

## 2016-09-29 NOTE — ED Notes (Signed)
Per pt's mother she did not give patient all her medication after discharge. Per mother she feels she does not need "psychotic medication" she only needs something to help her sleep. Pt very demanding at this time. Attempting to convince mother to bring her 40 year old daughter to ED to visit. Pt and mother advised against this d/t time and circumstances.

## 2016-09-29 NOTE — ED Notes (Signed)
Pt at the desk requesting to make a phone call, advised that this is her last one for the day. Pt verbalized understanding.

## 2016-09-29 NOTE — ED Notes (Signed)
Pt with tangential conversation, unable to communicate reasoning for visit to the ED. Pt initially reports it is because of her medications and her "family is always throwing her away in mental hospitals." Pt here because "my meds hype me up and all I do is sit on the internet, sit on tv, all day." Upon further assessment, pt reports she is in the ED "for them. All of em. My 40 year old always on the internet or the TV. My parents too. They need help. I just want someone to adopt my daughter and get her out of there, get her out of that environment."

## 2016-09-29 NOTE — ED Notes (Signed)
Faxed IVC paperwork to [place] at [facility]

## 2016-09-29 NOTE — ED Notes (Signed)
Dinner tray ordered.

## 2016-09-29 NOTE — ED Notes (Signed)
Pt walking in room, stating she is ready to go. Reoriented patient that she was IVC and that she would not be leaving. Pt agrees.

## 2016-09-29 NOTE — ED Notes (Signed)
IVC papers filled out by Dr. Dina Rich, given to secretary

## 2016-09-29 NOTE — Progress Notes (Signed)
Disposition CSW requesting patient's IVC paperwork be faxed to [facility] at [phone] in order to initiate referral process for possible placement.   Raynelle Bring, RN notified.   Radonna Ricker MSW, [place] Disposition [phone]

## 2016-09-29 NOTE — Progress Notes (Addendum)
CSW called to verify pt transfer to [facility].  Pt accepted today.Pt is IVC'd and must be transported by Event organiser.  [facility] cannot transport until 09/30/16 in the AM.    Per [facility] Psych ED Nurse, Sheliah Plane notified by patient's previous nurse, Rhae Lerner.  Areatha Keas. Judi Cong, MSW, [place] Disposition Clinical Social Work [phone] (cell) [phone] (office)   Per Admission Coordinator, Cindee Salt [facility] hospital will call when they have a bed available on 09/30/16.  CSW notified [facility] Psych ED Nurse, Annamary Carolin.

## 2016-09-29 NOTE — ED Notes (Signed)
Pt continues to wander in halls, very difficult to redirect

## 2016-09-29 NOTE — Progress Notes (Signed)
TTS received a call from Tollie Eth in [place] to file report.  Lind Covert, MSW, [place] TTS Specialist [phone]

## 2016-09-29 NOTE — Progress Notes (Signed)
TTS spoke with Jeanie Cooks, RN who states the pt is having labs drawn. Requested TTS call back in 10 minutes.  Lind Covert, MSW, [place] TTS Specialist [phone]

## 2016-09-29 NOTE — ED Notes (Signed)
Pt lunch tray given
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
## 2016-09-29 NOTE — ED Notes (Signed)
Per Romie Minus from [facility] to call when bed available tomorrow.
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
## 2016-09-29 NOTE — ED Notes (Signed)
Prom, NT at the bedside to draw labs.

## 2016-09-29 NOTE — ED Triage Notes (Signed)
The pts mother brought her in because she has not gone to sleep tonight  [facility] yesterday she reports that she had been there for 2 months until  Yesterday.  She talks constantly holding her neck  She reports that she cannot get any sleep because the tv and computer is too loud.  She also wants different housing

## 2016-09-29 NOTE — ED Notes (Addendum)
Pt updated to plan of care. Pt aware she is to be transported to [facility].

## 2016-09-29 NOTE — ED Provider Notes (Addendum)
[place] DEPT Provider Note   CSN: [id number] Arrival date & time: 09/29/16  0029     History   Chief Complaint Chief Complaint  Patient presents with  . Anxiety    HPI Veronica Perez is a 41 y.o. female.  HPI  This is a 41 year old female with history of bipolar disorder and hyperthyroidism who presents with her mother with concerns for not sleeping. Patient was discharged from behavioral health on 8/25.  At that time she was being treated for acute mania, decompensated. She refused to take multiple medications and based on the discharge note, she was not felt to be fully stabilize; however, at family request was discharged home. Mother reports that she has not slept since she has been home. She also has only taken 1 dose of her medications. She is taking her thyroid medication daily but both her mother and she states that "those other medications make me crazy." She states "I just need something to sleep." Patient feels that she needs "to stay and get better." She denies hallucinations or delusions; however, she ruminates on how loud the TV is at home and how her whole family has an addiction to TV and Internet.  She denies SI or HI.  Past Medical History:  Diagnosis Date  . Bipolar disorder ([place])   . Depression   . Panic attacks   . Thyroid disease     Patient Active Problem List   Diagnosis Date Noted  . Bipolar disorder, current episode manic severe with psychotic features ([place]) 09/16/2016  . PTSD (post-traumatic stress disorder) 09/16/2016  . Panic disorder 09/16/2016  . Low TSH level 05/30/2016  . Heart palpitations 05/30/2016  . Bipolar I disorder, most recent episode (or current) manic ([place]) 12/22/2012  . 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 PANIC ATTACKS 04/02/2006  . TOBACCO DEPENDENCE 04/02/2006  . RHINITIS, ALLERGIC 04/02/2006  . GASTROESOPHAGEAL REFLUX, NO ESOPHAGITIS 04/02/2006    Past Surgical History:    Procedure Laterality Date  . ADENOIDECTOMY    . OVARIAN CYST SURGERY    . TONSILLECTOMY      OB History    No data available       Home Medications    Prior to Admission medications   Medication Sig Start Date End Date Taking? Authorizing Provider  benztropine (COGENTIN) 0.5 MG tablet Take 1 tablet (0.5 mg total) by mouth at bedtime. 09/26/16  Yes Money, Lowry Ram, FNP  benztropine (COGENTIN) 1 MG tablet Take 1 mg by mouth at bedtime.   Yes [provider]  hydrOXYzine (VISTARIL) 25 MG capsule Take 25 mg by mouth every 6 (six) hours as needed for anxiety.   Yes [provider]  methimazole (TAPAZOLE) 10 MG tablet Take 1 tablet (10 mg total) by mouth 2 (two) times daily. 09/26/16  Yes Money, Lowry Ram, FNP  rOPINIRole (REQUIP) 1 MG tablet Take 1 tablet (1 mg total) by mouth at bedtime as needed (rls). Patient taking differently: Take 1 mg by mouth at bedtime.  09/26/16  Yes Money, Lowry Ram, FNP  traZODone (DESYREL) 100 MG tablet Take 1 tablet (100 mg total) by mouth at bedtime. 09/26/16  Yes Money, Lowry Ram, FNP  Paliperidone Palmitate (INVEGA SUSTENNA) 117 MG/0.75ML SUSP Inject 117 mg into the muscle every 28 (twenty-eight) days. Patient not taking: Reported on 09/29/2016 10/20/16   Money, Lowry Ram, FNP    Family History Family History  Problem Relation Age of Onset  . Diabetes Father   .  Heart disease Father   . Cancer Maternal Grandmother        breast cancer  . Bipolar disorder Mother     Social History Social History  Substance Use Topics  . Smoking status: Former Smoker    Quit date: 01/18/2012  . Smokeless tobacco: Never Used  . Alcohol use No     Allergies   Penicillins   Review of Systems Review of Systems  Constitutional: Positive for fever.  Respiratory: Negative for shortness of breath.   Cardiovascular: Negative for chest pain.  Gastrointestinal: Negative for abdominal pain.  Psychiatric/Behavioral: Positive for sleep disturbance.  Negative for self-injury and suicidal ideas. The patient is nervous/anxious and is hyperactive.   All other systems reviewed and are negative.    Physical Exam Updated Vital Signs BP 120/77   Pulse 90   Temp 99 F (37.2 C) (Oral)   Resp 15   Ht 5\' 4"  (1.626 m)   Wt 85.7 kg (189 lb)   SpO2 100%   BMI 32.44 kg/m   Physical Exam  Constitutional: She is oriented to person, place, and time.  Hyperactive, no acute distress  HENT:  Head: Normocephalic and atraumatic.  Eyes: Pupils are equal, round, and reactive to light.  Cardiovascular: Regular rhythm and normal heart sounds.   tachycardia  Pulmonary/Chest: Effort normal and breath sounds normal. No respiratory distress. She has no wheezes.  Abdominal: Soft. There is no tenderness.  Musculoskeletal: She exhibits no edema.  Neurological: She is alert and oriented to person, place, and time.  Skin: Skin is warm and dry.  Psychiatric: She has a normal mood and affect.  Very excitable, hyperactive, ruminates on how the TV and wifi bother her  Nursing note and vitals reviewed.    ED Treatments / Results  Labs (all labs ordered are listed, but only abnormal results are displayed) Labs Reviewed  CBC WITH DIFFERENTIAL/PLATELET - Abnormal; Notable for the following:       Result Value   WBC 10.7 (*)    All other components within normal limits  BASIC METABOLIC PANEL  ETHANOL  RAPID URINE DRUG SCREEN, HOSP PERFORMED    EKG  EKG Interpretation  Date/Time:  Monday September 29 2016 03:40:01 EDT Ventricular Rate:  91 PR Interval:    QRS Duration: 98 QT Interval:  383 QTC Calculation: 472 R Axis:   83 Text Interpretation:  Sinus rhythm Confirmed by Thayer Jew [phone]) on 09/29/2016 5:08:23 AM       Radiology No results found.  Procedures Procedures (including critical care time)  Medications Ordered in ED Medications  sterile water (preservative free) injection (not administered)  lip balm (BLISTEX) ointment (  Topical Given 09/29/16 0448)  ziprasidone (GEODON) injection 20 mg (20 mg Intramuscular Given 09/29/16 0412)     Initial Impression / Assessment and Plan / ED Course  I have reviewed the triage vital signs and the nursing notes.  Pertinent labs & imaging results that were available during my care of the patient were reviewed by me and considered in my medical decision making (see chart for details).     Patient presents with sleep disturbance. She appears acutely manic and has not been compliant with her medications at home. Mother confirms this. It appears that she was not totally controlled at discharge but was discharged partially at the family's request.  I discussed with the mother and the patient that I felt that she needed further stabilization. She appears acutely manic and would likely be very difficult  to stabilize at home given that she is unwilling to take medications. We'll have TTS evaluate. She is mildly tachycardic but otherwise her vital signs are reassuring. Screening lab work obtained.  5:10 AM  Patient is medically clear for TTS evaluation. Patient given Geodon given acute mania and difficulty sleeping.  7:19 AM Patient requesting to leave. She reports wanting to speak to her lawyer. She continues to have fixed delusions.  I discussed the patient with the counselor at behavioral health.at this time she does not have suicidal or homicidal ideation; however, she does appear acutely psychotic. In conjunction with them, decision made to IVC patient for her safety and her daughter safety. She needs a psych eval. She was subsequently given IM Zyprexa.    Final Clinical Impressions(s) / ED Diagnoses   Final diagnoses:  Bipolar I disorder with mania [facility])    New Prescriptions New Prescriptions   No medications on file     Merryl Hacker, MD 09/29/16 4103    Merryl Hacker, MD 09/29/16 0131    Merryl Hacker, MD 09/29/16 [phone]

## 2016-09-30 ENCOUNTER — Inpatient Hospital Stay: Payer: Self-pay | Admitting: Nurse Practitioner

## 2016-09-30 DIAGNOSIS — G479 Sleep disorder, unspecified: Secondary | ICD-10-CM | POA: Diagnosis not present

## 2016-09-30 NOTE — ED Notes (Signed)
Visitor at bedside; brought belongings for disposition; added to belongings

## 2016-09-30 NOTE — Progress Notes (Signed)
CSW received a call from patient's nurse, Luretha Rued, RN regarding patient's bed at [facility]. Per Vikki Ports, there was some confusion as to whether patient still had a bed at [facility].   Disposition CSW spoke with Marita Kansas [phone]) in admissions at [facility]. Per Marita Kansas, patient still has a bed, and patient can transport at anytime, bed is still available.   CSW spoke with the patient's nurse, Luretha Rued, RN and notified her that the patient still has a bed and can be transported to [facility] facility today.    Patient is IVC'd and will need to be transported by sheriff.   Chelsea Felts, RN notified.    Radonna Ricker MSW, [place] Disposition [phone]

## 2016-09-30 NOTE — ED Notes (Signed)
Updated mother pt to be transported to psychiatric facility today

## 2016-09-30 NOTE — ED Notes (Addendum)
Sitter will get VS closer to 7am; breakfast ordered

## 2016-09-30 NOTE — ED Notes (Signed)
Sheriffs Dept called stating pt would be transported between 1445 and 1500

## 2016-09-30 NOTE — ED Notes (Signed)
Patient was talking to mom on phone.

## 2016-09-30 NOTE — ED Notes (Signed)
Spoke with transportation office at [facility] and notified that pt does have a bed at [facility] and need to be transported. Sheriff states he does not have an ETA at this time but will call back when he knows more.

## 2016-09-30 NOTE — ED Notes (Signed)
Pt in room crying while eating lunch. Pt reports she does not want to stay here for treatment. I explained to pt that she would be going to [facility] later today for treatment. PT reports she does not want to go that far. Emotional support provided and I explained to pt she needed to go for treatment.

## 2016-09-30 NOTE — ED Notes (Signed)
Pt on phone with mother at desk. Mother asking to speak to me. She is questioning if Veronica Perez will check pts thyroid. I explained to her she would have to schedule an outpatient appointment once discharged from psychiatric facility. Mother states she does not understand why it cant be checked there. I attempted to explain to pts mother that they focus on mental health at this facility and the thyroid can be assessed at discharge d/t the pt being stable medically at this time.

## 2016-09-30 NOTE — ED Notes (Signed)
Patient was given a snack and Drink, and A Regular Diet was ordered for Lunch.

## 2016-09-30 NOTE — ED Notes (Signed)
Pt belongings (2 bags) and IVC papers given to [facility]. In transfer packet. PT ambulatory out of department. NAD. VSS

## 2016-09-30 NOTE — ED Notes (Signed)
CSW called stating pt does have a bed at available and has since yesterday. Will contact sheriffs dept to arrange transportation again at this time.

## 2016-09-30 NOTE — ED Notes (Signed)
Pt up eating breakfast  

## 2016-09-30 NOTE — ED Notes (Addendum)
Patient in room crying at this time, Stated she didn't want her food she had received for Lunch, and offered her tray to sitter and I we encourage her to eat her lunch, she started drinking her drink.

## 2016-09-30 NOTE — ED Notes (Signed)
Spoke with CSW at [facility] about status of pts bed at [facility]. The last update from [place] yesterday was that Veronica Perez would call today 8/28 and notify us when a bed was ready and pt could be transported. CSW states she thought pt had a bed and was waiting for transportation but would verify and call me back.

## 2016-10-02 ENCOUNTER — Inpatient Hospital Stay: Payer: Self-pay | Admitting: Nurse Practitioner

## 2016-10-07 ENCOUNTER — Telehealth: Payer: Self-pay | Admitting: Nurse Practitioner

## 2016-10-07 NOTE — Telephone Encounter (Signed)
Pt dad called in and said that daughter was in the hosp on 7/24 that is why she did not show. He would like to have fee waived

## 2016-10-09 NOTE — Telephone Encounter (Signed)
Veronica Perez to notify patient we will waive No Show for 08/26/16 as a courtesy. Cannot extend this again due to additional NS 07/2016.

## 2016-10-24 ENCOUNTER — Ambulatory Visit
Admission: RE | Admit: 2016-10-24 | Discharge: 2016-10-24 | Disposition: A | Discharge status: Home / Self Care | Payer: Medicare Other | Source: Ambulatory Visit | Attending: Nurse Practitioner | Admitting: Nurse Practitioner

## 2016-10-24 DIAGNOSIS — E0590 Thyrotoxicosis, unspecified without thyrotoxic crisis or storm: Secondary | ICD-10-CM

## 2016-10-27 ENCOUNTER — Other Ambulatory Visit: Payer: Self-pay | Admitting: Nurse Practitioner

## 2016-10-27 DIAGNOSIS — E0590 Thyrotoxicosis, unspecified without thyrotoxic crisis or storm: Secondary | ICD-10-CM

## 2016-10-27 DIAGNOSIS — E041 Nontoxic single thyroid nodule: Secondary | ICD-10-CM

## 2016-11-05 ENCOUNTER — Encounter: Payer: Self-pay | Admitting: Nurse Practitioner

## 2016-11-20 ENCOUNTER — Telehealth: Payer: Self-pay | Admitting: Nurse Practitioner

## 2016-11-20 NOTE — Telephone Encounter (Signed)
Patient requesting calling back.  Would like to know how many nodules she has in her thyroid?  Patient states she has set appt up with endocrinology.

## 2016-11-21 NOTE — Telephone Encounter (Signed)
Left vm for the pt to call back, need to inform her that there were 2 nodules found but only 1 needed to be biopsy.

## 2016-11-21 NOTE — Telephone Encounter (Signed)
Pt informed and understands, jist wanted her mind eased.

## 2016-11-24 ENCOUNTER — Encounter: Payer: Self-pay | Admitting: Endocrinology

## 2016-11-24 ENCOUNTER — Ambulatory Visit (INDEPENDENT_AMBULATORY_CARE_PROVIDER_SITE_OTHER): Payer: Medicare Other | Admitting: Endocrinology

## 2016-11-24 DIAGNOSIS — E0590 Thyrotoxicosis, unspecified without thyrotoxic crisis or storm: Secondary | ICD-10-CM

## 2016-11-24 NOTE — Progress Notes (Signed)
Subjective:    Patient ID: Veronica Perez, female    DOB: 02/06/1975, 41 y.o.   MRN: [id number]  HPI Pt is referred by Wilfred Lacy, NP, for hyperthyroidism.  Pt reports he was dx'ed with hyperthyroidism in early 2018.  she was rx'ed tapazole, but she stopped due to severe anxiety, and assoc insomnia.  sxs improved off it.  she has never had XRT to the anterior neck, or thyroid surgery.  she intermittently takes kelp.  she has never been on amiodarone.   Past Medical History:  Diagnosis Date  . Bipolar disorder ([place])   . Depression   . Panic attacks   . Thyroid disease     Past Surgical History:  Procedure Laterality Date  . ADENOIDECTOMY    . OVARIAN CYST SURGERY    . TONSILLECTOMY      Social History   Social History  . Marital status: Single    Spouse name: N/A  . Number of children: N/A  . Years of education: N/A   Occupational History  . Not on file.   Social History Main Topics  . Smoking status: Former Smoker    Quit date: 01/18/2012  . Smokeless tobacco: Never Used  . Alcohol use No  . Drug use: No  . Sexual activity: Not on file   Other Topics Concern  . Not on file   Social History Narrative  . No narrative on file    Current Outpatient Prescriptions on File Prior to Visit  Medication Sig Dispense Refill  . benztropine (COGENTIN) 1 MG tablet Take 1 mg by mouth at bedtime.     No current facility-administered medications on file prior to visit.     Allergies  Allergen Reactions  . Penicillins Hives, Itching, Nausea And Vomiting and Other (See Comments)    Has patient had a PCN reaction causing immediate rash, facial/tongue/throat swelling, SOB or lightheadedness with hypotension: No Has patient had a PCN reaction causing severe rash involving mucus membranes or skin necrosis: No Has patient had a PCN reaction that required hospitalization: No Has patient had a PCN reaction occurring within the last 10 years: No If all of the above answers  are "NO", then may proceed with Cephalosporin use.    Family History  Problem Relation Age of Onset  . Diabetes Father   . Heart disease Father   . Cancer Maternal Grandmother        breast cancer  . Bipolar disorder Mother   . Thyroid disease Neg Hx     BP 110/68 (BP Location: Left Arm, Patient Position: Sitting, Cuff Size: Normal)   Pulse 99   Temp 98.6 F (37 C) (Oral)   Ht 5\' 4"  (1.626 m)   Wt 189 lb 6 oz (85.9 kg)   SpO2 96%   BMI 32.51 kg/m    Review of Systems denies weight loss, headache, hoarseness, visual loss, sob, diarrhea, polyuria, muscle weakness, edema, easy bruising, and rhinorrhea.  She has heat intolerance, tremor, excessive diaphoresis, palpitations, and depression, but these are improved recently.      Objective:   Physical Exam VS: see vs page GEN: no distress HEAD: head: no deformity eyes: no periorbital swelling, no proptosis external nose and ears are normal mouth: no lesion seen NECK: I do not appreciate the nodules, or a goiter CHEST WALL: no deformity LUNGS: clear to auscultation CV: reg rate and rhythm, no murmur ABD: abdomen is soft, nontender.  no hepatosplenomegaly.  not distended.  no hernia  MUSCULOSKELETAL: muscle bulk and strength are grossly normal.  no obvious joint swelling.  gait is normal and steady EXTEMITIES: no deformity.  no edema PULSES: no carotid bruit NEURO:  cn 2-12 grossly intact.   readily moves all 4's.  sensation is intact to touch on all 4's.  No tremor SKIN:  Normal texture and temperature.  No rash or suspicious lesion is visible.  Not diaphoretic NODES:  None palpable at the neck.  PSYCH: alert, well-oriented.  Does not appear anxious nor depressed.    [place]: 1.5 cm right upper pole TR 4 nodule meets criteria for biopsy as above. 1.4 cm left inferior TR 4 nodule meets criteria for follow-up in 1 year.  Lab Results  Component Value Date   TSH <0.010 (L) 09/17/2016   T3TOTAL 198 (H) 09/17/2016   T4TOTAL 16.0  (H) 06/10/2016   I have reviewed outside records, and summarized: Pt was noted to have abnormal TFT, and referred here.  She was hospitalized due to bipolar illness 2 mos ago.     Assessment & Plan:  Hyperthyroidism, new.  She has some features of Grave's Dn and multinodular goiter, so she may have both.  We discussed rx options.  She chooses [facility].    Patient Instructions  let's check a thyroid "scan" (a special, but easy and painless type of thyroid x ray).  It works like this: you go to the x-ray department of the hospital to swallow a pill, which contains a miniscule amount of radiation.  You will not notice any symptoms from this.  You will go back to the x-ray department the next day, to lie down in front of a camera.  The results of this will be sent to me.  They radiologist may want you to do a biopsy.  If so, don't worry--the biopsy is easy, and the result is unable to be anything to worry about.   Based on the results, I hope to order for you a treatment pill of radioactive iodine.  Although it is a larger amount of radiation, you will again notice no symptoms from this.  The pill is gone from your body in a few days (during which you should stay away from other people), but takes several months to work.  Therefore, please return here approximately 6-8 weeks after the treatment.  This treatment has been available for many years, and the only known side-effect is an underactive thyroid.  It is possible that i would eventually prescribe for you a thyroid hormone pill, which is very inexpensive.  You don't have to worry about side-effects of this thyroid hormone pill, because it is the same molecule your thyroid makes.

## 2016-11-24 NOTE — Patient Instructions (Signed)
let's check a thyroid "scan" (a special, but easy and painless type of thyroid x ray).  It works like this: you go to the x-ray department of the hospital to swallow a pill, which contains a miniscule amount of radiation.  You will not notice any symptoms from this.  You will go back to the x-ray department the next day, to lie down in front of a camera.  The results of this will be sent to me.  They radiologist may want you to do a biopsy.  If so, don't worry--the biopsy is easy, and the result is unable to be anything to worry about.   Based on the results, I hope to order for you a treatment pill of radioactive iodine.  Although it is a larger amount of radiation, you will again notice no symptoms from this.  The pill is gone from your body in a few days (during which you should stay away from other people), but takes several months to work.  Therefore, please return here approximately 6-8 weeks after the treatment.  This treatment has been available for many years, and the only known side-effect is an underactive thyroid.  It is possible that i would eventually prescribe for you a thyroid hormone pill, which is very inexpensive.  You don't have to worry about side-effects of this thyroid hormone pill, because it is the same molecule your thyroid makes.

## 2016-12-11 ENCOUNTER — Other Ambulatory Visit (INDEPENDENT_AMBULATORY_CARE_PROVIDER_SITE_OTHER): Payer: Medicare Other

## 2016-12-11 ENCOUNTER — Encounter: Payer: Self-pay | Admitting: Nurse Practitioner

## 2016-12-11 ENCOUNTER — Ambulatory Visit (INDEPENDENT_AMBULATORY_CARE_PROVIDER_SITE_OTHER): Payer: Medicare Other | Admitting: Nurse Practitioner

## 2016-12-11 VITALS — BP 110/70 | HR 78 | Temp 97.7°F | Ht 64.0 in | Wt 184.0 lb

## 2016-12-11 DIAGNOSIS — Z136 Encounter for screening for cardiovascular disorders: Secondary | ICD-10-CM

## 2016-12-11 DIAGNOSIS — Z13220 Encounter for screening for lipoid disorders: Secondary | ICD-10-CM

## 2016-12-11 DIAGNOSIS — Z114 Encounter for screening for human immunodeficiency virus [HIV]: Secondary | ICD-10-CM

## 2016-12-11 DIAGNOSIS — R21 Rash and other nonspecific skin eruption: Secondary | ICD-10-CM

## 2016-12-11 MED ORDER — TRIAMCINOLONE ACETONIDE 0.025 % EX OINT: 1.0000 "application " | TOPICAL_OINTMENT | Freq: Two times a day (BID) | CUTANEOUS | 0 refills | Status: DC

## 2016-12-11 NOTE — Progress Notes (Signed)
Subjective:  Patient ID: Veronica Perez, female    DOB: [birth year]  Age: 41 y.o. MRN: [id number]  CC: Follow-up (routine 3 month)   Rash  This is a new problem. The current episode started 1 to 4 weeks ago. The problem is unchanged. The affected locations include the left hand, right hand, right arm and left arm. The rash is characterized by redness. It is unknown if there was an exposure to a precipitant. Pertinent negatives include no anorexia, congestion, cough, facial edema, fatigue, joint pain, nail changes or shortness of breath. Past treatments include nothing. Her past medical history is significant for eczema.   Hyperthyroidism: She is waiting to be scheduled for iodine ablation. She refuses to take any medication.  Bipolar Disorder: She is not taking any medication at this time. She states she does not need any medication. Refuses to return to psychiatry or psychology.  Outpatient Medications Prior to Visit  Medication Sig Dispense Refill  . LORazepam (ATIVAN) 1 MG tablet Take 1 mg by mouth 3 (three) times daily.  1  . benztropine (COGENTIN) 1 MG tablet Take 1 mg by mouth at bedtime.     No facility-administered medications prior to visit.     ROS See HPI  Objective:  BP 110/70 (BP Location: Left Arm, Patient Position: Sitting, Cuff Size: Normal)   Pulse 78   Temp 97.7 F (36.5 C) (Oral)   Ht 5\' 4"  (1.626 m)   Wt 184 lb (83.5 kg)   SpO2 99%   BMI 31.58 kg/m   BP Readings from Last 3 Encounters:  12/11/16 110/70  11/24/16 110/68  09/30/16 120/61    Wt Readings from Last 3 Encounters:  12/11/16 184 lb (83.5 kg)  11/24/16 189 lb 6 oz (85.9 kg)  09/29/16 189 lb (85.7 kg)    Physical Exam  Constitutional: She is oriented to person, place, and time. No distress.  Cardiovascular: Normal rate, regular rhythm and normal heart sounds.  Pulmonary/Chest: Effort normal and breath sounds normal.  Neurological: She is alert and oriented to person, place, and  time.  Skin: Skin is warm, dry and intact. Rash noted.     Vitals reviewed.   Lab Results  Component Value Date   WBC 10.7 (H) 09/29/2016   HGB 12.9 09/29/2016   HCT 37.9 09/29/2016   PLT 275 09/29/2016   GLUCOSE 89 09/29/2016   CHOL 105 12/11/2016   TRIG 120.0 12/11/2016   HDL 25.70 (L) 12/11/2016   LDLCALC 56 12/11/2016   ALT 24 09/13/2016   AST 20 09/13/2016   NA 138 09/29/2016   K 3.6 09/29/2016   CL 105 09/29/2016   CREATININE 0.60 09/29/2016   BUN 9 09/29/2016   CO2 24 09/29/2016   TSH <0.010 (L) 09/17/2016   HGBA1C 4.9 09/17/2016    US Thyroid  Result Date: 10/24/2016 CLINICAL DATA:  Hyperthyroidism EXAM: THYROID ULTRASOUND TECHNIQUE: Ultrasound examination of the thyroid gland and adjacent soft tissues was performed. COMPARISON:  None. FINDINGS: Parenchymal Echotexture: Moderately heterogenous Isthmus: 3 mm Right lobe: 5.5 x 1.9 x 2.3 cm Left lobe: 4.7 x 1.6 x 1.8 cm _________________________________________________________ Estimated total number of nodules >/= 1 cm: 2 Number of spongiform nodules >/=  2 cm not described below (TR1): 0 Number of mixed cystic and solid nodules >/= 1.5 cm not described below (TR2): 0 _________________________________________________________ Nodule # 1: Location: Right; Superior Maximum size: 1.5 cm; Other 2 dimensions: 0.7 x 1.2 cm Composition: solid/almost completely solid (2) Echogenicity: hypoechoic (2)  Shape: not taller-than-wide (0) Margins: ill-defined (0) Echogenic foci: none (0) ACR TI-RADS total points: 4. ACR TI-RADS risk category: TR4 (4-6 points). ACR TI-RADS recommendations: **Given size (>/= 1.5 cm) and appearance, fine needle aspiration of this moderately suspicious nodule should be considered based on TI-RADS criteria. _________________________________________________________ Nodule # 2: Location: Left; Inferior Maximum size: 1.4 cm; Other 2 dimensions: 1.2 x 1.3 cm Composition: solid/almost completely solid (2) Echogenicity:  hypoechoic (2) Shape: not taller-than-wide (0) Margins: ill-defined (0) Echogenic foci: none (0) ACR TI-RADS total points: 4. ACR TI-RADS risk category: TR4 (4-6 points). ACR TI-RADS recommendations: *Given size (>/= 1 - 1.4 cm) and appearance, a follow-up ultrasound in 1 year should be considered based on TI-RADS criteria. _________________________________________________________ IMPRESSION: 1.5 cm right upper pole TR 4 nodule meets criteria for biopsy as above. 1.4 cm left inferior TR 4 nodule meets criteria for follow-up in 1 year. The above is in keeping with the ACR TI-RADS recommendations - J Am Coll Radiol 2017;14:587-595. Electronically Signed   By: Jerilynn Mages.  Shick M.D.   On: 10/24/2016 14:15    Assessment & Plan:   Veronica Perez was seen today for follow-up.  Diagnoses and all orders for this visit:  Macular rash -     RPR; Future -     triamcinolone (KENALOG) 0.025 % ointment; Apply 1 application 2 (two) times daily topically.  Encounter for lipid screening for cardiovascular disease -     Lipid panel; Future  Encounter for screening for human immunodeficiency virus (HIV) -     HIV antibody; Future   I am having Veronica Perez start on triamcinolone. I am also having her maintain her benztropine and LORazepam.  Meds ordered this encounter  Medications  . triamcinolone (KENALOG) 0.025 % ointment    Sig: Apply 1 application 2 (two) times daily topically.    Dispense:  80 g    Refill:  0    Order Specific Question:   Supervising Provider    Answer:   Cassandria Anger [[id number]]    Follow-up: Return in about 3 months (around 03/13/2017) for CPE (fasting).  Wilfred Lacy, NP

## 2016-12-11 NOTE — Patient Instructions (Addendum)
[place], [place].  Negative HIV and RPR. Rash possible related to eczema. Will try triamcinolone. Refer to dermatology if no improvement.

## 2016-12-12 LAB — LIPID PANEL
CHOLESTEROL: 105 mg/dL (ref 0–200)
HDL: 25.7 mg/dL — ABNORMAL LOW (ref >39.00)
LDL CALC: 56 mg/dL (ref 0–99)
NonHDL: 79.6
TRIGLYCERIDES: 120 mg/dL (ref 0.0–149.0)
Total CHOL/HDL Ratio: 4
VLDL: 24 mg/dL (ref 0.0–40.0)

## 2016-12-12 LAB — RPR: RPR Ser Ql: NONREACTIVE

## 2016-12-12 LAB — HIV ANTIBODY (ROUTINE TESTING W REFLEX): HIV: NONREACTIVE

## 2016-12-15 ENCOUNTER — Encounter: Payer: Self-pay | Admitting: Nurse Practitioner

## 2016-12-17 ENCOUNTER — Encounter (HOSPITAL_COMMUNITY): Payer: Medicare Other

## 2016-12-18 ENCOUNTER — Telehealth: Payer: Self-pay | Admitting: Endocrinology

## 2016-12-18 ENCOUNTER — Encounter (HOSPITAL_COMMUNITY): Payer: Medicare Other

## 2016-12-18 NOTE — Telephone Encounter (Signed)
Patient need a call form the doctor after lunch, she stated her case is pending and she wasn't able to bee seen in radiology today like she was supposed to. please advise  patient called the [facility]

## 2016-12-18 NOTE — Telephone Encounter (Signed)
I don't understand this message.  Does pt mean that this needs PA? If so, please refer to [facility].  Also, what is [facility]?

## 2016-12-19 NOTE — Telephone Encounter (Signed)
I called patient to ask her to call back to clarify message from yesterday.

## 2017-04-15 ENCOUNTER — Ambulatory Visit: Payer: Medicare Other | Admitting: Nurse Practitioner

## 2017-04-16 ENCOUNTER — Ambulatory Visit: Payer: Medicare Other | Admitting: Nurse Practitioner

## 2017-04-16 ENCOUNTER — Ambulatory Visit (INDEPENDENT_AMBULATORY_CARE_PROVIDER_SITE_OTHER): Payer: Medicare Other | Admitting: Nurse Practitioner

## 2017-04-16 ENCOUNTER — Encounter: Payer: Self-pay | Admitting: Nurse Practitioner

## 2017-04-16 VITALS — BP 106/70 | HR 73 | Temp 97.5°F | Ht 64.0 in | Wt 199.0 lb

## 2017-04-16 DIAGNOSIS — Z201 Contact with and (suspected) exposure to tuberculosis: Secondary | ICD-10-CM

## 2017-04-16 NOTE — Patient Instructions (Signed)
Tuberculosis Tuberculosis (TB) is an infection that harms body tissues. TB usually affects your lungs but can affect other parts of your body. There are two stages of TB:  Active TB. This means you have the symptoms of TB, and it can easily spread from person to person (contagious).  Latent TB. This means you do not have any symptoms of TB, and you are not contagious.  It is important to get treatment no matter what type of TB you have. Follow these instructions at home:  Take your antibiotic medicine as told by your doctor. Finish it all even if you start to feel better.  Keep all follow-up visits as told by your doctor. This is important.  Tell your doctor about all of the people you live with or with whom you have close contact. Your doctor or the Department of Health may talk to these people about being tested for TB.  Rest as needed.  Do not use any tobacco products, including cigarettes, chewing tobacco, or electronic cigarettes. If you need help quitting, ask your doctor.  Avoid close contact with others, especially infants and older people. Do this until your doctor says you will no longer spread TB.  Cover your mouth and nose when you cough or sneeze. Get rid of used tissues as told by your doctor.  Wash your hands often with soap and water.  Do not go back to work or school until your doctor says it is okay. Contact a doctor if:  You have new symptoms.  You are not hungry.  You feel sick to your stomach (nauseous) or throw up (vomit).  Your pee (urine) is dark yellow.  Your skin or the white part of your eyes turns a yellowish color.  Your symptoms do not go away or get worse.  You have a fever. Get help right away if:  You have chest pain.  You cough up blood.  You have trouble breathing or feel short of breath.  You have a headache or stiff neck. This information is not intended to replace advice given to you by your health care provider. Make sure you  discuss any questions you have with your health care provider. Document Released: 11/17/2008 Document Revised: 09/23/2015 Document Reviewed: 04/27/2013 Elsevier Interactive Patient Education  2018 [employer].

## 2017-04-16 NOTE — Progress Notes (Signed)
Subjective:  Patient ID: Veronica Perez, female    DOB: 02-27-1975  Age: 41 y.o. MRN: [id number]  CC: Follow-up (TB exposure consult/ tdap?)  HPI  Close friend diagnosed with TB. Will like to be testes Denies any symptoms. Will like quantiferron only.  Outpatient Medications Prior to Visit  Medication Sig Dispense Refill  . benztropine (COGENTIN) 1 MG tablet Take 1 mg by mouth at bedtime.    Marland Kitchen LORazepam (ATIVAN) 1 MG tablet Take 1 mg by mouth 3 (three) times daily.  1  . triamcinolone (KENALOG) 0.025 % ointment Apply 1 application 2 (two) times daily topically. (Patient not taking: Reported on 04/16/2017) 80 g 0   No facility-administered medications prior to visit.     ROS Review of Systems  Constitutional: Negative for chills, diaphoresis, fever, malaise/fatigue and weight loss.  Respiratory: Negative for cough and sputum production.   Cardiovascular: Negative for chest pain.  Musculoskeletal: Negative for joint pain and myalgias.  Skin: Negative.     Objective:  BP 106/70   Pulse 73   Temp (!) 97.5 F (36.4 C) (Oral)   Ht 5\' 4"  (1.626 m)   Wt 199 lb (90.3 kg)   SpO2 97%   BMI 34.16 kg/m   BP Readings from Last 3 Encounters:  04/16/17 106/70  12/11/16 110/70  11/24/16 110/68    Wt Readings from Last 3 Encounters:  04/16/17 199 lb (90.3 kg)  12/11/16 184 lb (83.5 kg)  11/24/16 189 lb 6 oz (85.9 kg)    Physical Exam  Constitutional: She is oriented to person, place, and time. No distress.  Cardiovascular: Normal rate.  Pulmonary/Chest: Effort normal and breath sounds normal.  Lymphadenopathy:    She has no cervical adenopathy.  Neurological: She is alert and oriented to person, place, and time.  Vitals reviewed.   Lab Results  Component Value Date   WBC 10.7 (H) 09/29/2016   HGB 12.9 09/29/2016   HCT 37.9 09/29/2016   PLT 275 09/29/2016   GLUCOSE 89 09/29/2016   CHOL 105 12/11/2016   TRIG 120.0 12/11/2016   HDL 25.70 (L) 12/11/2016   LDLCALC 56 12/11/2016   ALT 24 09/13/2016   AST 20 09/13/2016   NA 138 09/29/2016   K 3.6 09/29/2016   CL 105 09/29/2016   CREATININE 0.60 09/29/2016   BUN 9 09/29/2016   CO2 24 09/29/2016   TSH <0.010 (L) 09/17/2016   HGBA1C 4.9 09/17/2016    US Thyroid  Result Date: 10/24/2016 CLINICAL DATA:  Hyperthyroidism EXAM: THYROID ULTRASOUND TECHNIQUE: Ultrasound examination of the thyroid gland and adjacent soft tissues was performed. COMPARISON:  None. FINDINGS: Parenchymal Echotexture: Moderately heterogenous Isthmus: 3 mm Right lobe: 5.5 x 1.9 x 2.3 cm Left lobe: 4.7 x 1.6 x 1.8 cm _________________________________________________________ Estimated total number of nodules >/= 1 cm: 2 Number of spongiform nodules >/=  2 cm not described below (TR1): 0 Number of mixed cystic and solid nodules >/= 1.5 cm not described below (TR2): 0 _________________________________________________________ Nodule # 1: Location: Right; Superior Maximum size: 1.5 cm; Other 2 dimensions: 0.7 x 1.2 cm Composition: solid/almost completely solid (2) Echogenicity: hypoechoic (2) Shape: not taller-than-wide (0) Margins: ill-defined (0) Echogenic foci: none (0) ACR TI-RADS total points: 4. ACR TI-RADS risk category: TR4 (4-6 points). ACR TI-RADS recommendations: **Given size (>/= 1.5 cm) and appearance, fine needle aspiration of this moderately suspicious nodule should be considered based on TI-RADS criteria. _________________________________________________________ Nodule # 2: Location: Left; Inferior Maximum size: 1.4 cm; Other 2  dimensions: 1.2 x 1.3 cm Composition: solid/almost completely solid (2) Echogenicity: hypoechoic (2) Shape: not taller-than-wide (0) Margins: ill-defined (0) Echogenic foci: none (0) ACR TI-RADS total points: 4. ACR TI-RADS risk category: TR4 (4-6 points). ACR TI-RADS recommendations: *Given size (>/= 1 - 1.4 cm) and appearance, a follow-up ultrasound in 1 year should be considered based on TI-RADS  criteria. _________________________________________________________ IMPRESSION: 1.5 cm right upper pole TR 4 nodule meets criteria for biopsy as above. 1.4 cm left inferior TR 4 nodule meets criteria for follow-up in 1 year. The above is in keeping with the ACR TI-RADS recommendations - J Am Coll Radiol 2017;14:587-595. Electronically Signed   By: Jerilynn Mages.  Shick M.D.   On: 10/24/2016 14:15    Assessment & Plan:   Veronica Perez was seen today for follow-up.  Diagnoses and all orders for this visit:  Exposure to TB -     QuantiFERON-TB Gold Plus   I am having Veronica Perez maintain her benztropine, LORazepam, and triamcinolone.  No orders of the defined types were placed in this encounter.   Follow-up: No Follow-up on file.  Wilfred Lacy, NP

## 2017-04-17 ENCOUNTER — Encounter: Payer: Self-pay | Admitting: Nurse Practitioner

## 2017-04-18 LAB — QUANTIFERON-TB GOLD PLUS
NIL: 0.01 [IU]/mL
QuantiFERON-TB Gold Plus: NEGATIVE
TB1-NIL: 0 IU/mL
TB2-NIL: 0 [IU]/mL

## 2017-04-21 ENCOUNTER — Telehealth: Payer: Self-pay | Admitting: Endocrinology

## 2017-04-21 NOTE — Telephone Encounter (Signed)
Please advise ov, as it has been a while.  Then we can make plans.

## 2017-04-21 NOTE — Telephone Encounter (Signed)
Patient stated she has not been able to get scan done of thyroid. That when she went in first time it was not approved. She states it is now approved and would like to know who she needs to speak to that would schedule to have this done.  Please advise

## 2017-04-22 NOTE — Telephone Encounter (Signed)
Left voicemail advising the patient to call back and schedule an appointment.

## 2017-04-30 ENCOUNTER — Telehealth: Payer: Self-pay | Admitting: Endocrinology

## 2017-04-30 NOTE — Telephone Encounter (Signed)
Please forward to [facility] 

## 2017-04-30 NOTE — Telephone Encounter (Signed)
Patient has been unable to get a Radiology appointment because Radiology office told patient she was not approved to have it done. Please call patient at ph# [phone] to advise.  Patient's PCP told patient that the appointment was finally approved-patient is upset because she has been calling us and cardiologist for approx. 6 months but has been ignored. Her throat is very swollen-PCP says due to thyroid. PCP found 2 nodules (1 on each thyroid). Patient wants testing done asap to make sure it is not cancer and needs relief from her current condition.

## 2017-05-11 ENCOUNTER — Telehealth: Payer: Self-pay | Admitting: Endocrinology

## 2017-05-11 NOTE — Telephone Encounter (Signed)
It has been some months.  Can pt come in for f/u appt, as things might be different since then.

## 2017-05-11 NOTE — Telephone Encounter (Signed)
Patient is calling back about referral sent For radiology about 6 months ago and has not heard anything back. And would like to know what is taking so long to hear from someone.   Please advise

## 2017-05-12 NOTE — Telephone Encounter (Signed)
Left message on machine for patient to return our call 

## 2017-05-13 ENCOUNTER — Telehealth: Payer: Self-pay | Admitting: Endocrinology

## 2017-05-13 NOTE — Telephone Encounter (Signed)
Patient stated missed phone call from office

## 2017-05-15 NOTE — Telephone Encounter (Signed)
I called & left detailed VM asked that patient call back to make f/u appt.

## 2017-06-17 ENCOUNTER — Encounter: Payer: Self-pay | Admitting: Endocrinology

## 2017-06-17 ENCOUNTER — Ambulatory Visit (INDEPENDENT_AMBULATORY_CARE_PROVIDER_SITE_OTHER): Payer: Medicare Other | Admitting: Endocrinology

## 2017-06-17 VITALS — BP 132/66 | HR 108 | Ht 64.0 in | Wt 204.0 lb

## 2017-06-17 DIAGNOSIS — E0590 Thyrotoxicosis, unspecified without thyrotoxic crisis or storm: Secondary | ICD-10-CM | POA: Diagnosis not present

## 2017-06-17 NOTE — Patient Instructions (Addendum)
blood tests are requested for you today.  We'll let you know about the results.   Let's check the ultrasound.  you will receive a phone call, about a day and time for an appointment.   If the thyroid is overactive again, I'll prescribe for you the radioactive iodine: We would first check a thyroid "scan" (a special, but easy and painless type of thyroid x ray).  It works like this: you go to the x-ray department of the hospital to swallow a pill, which contains a miniscule amount of radiation.  You will not notice any symptoms from this.  You will go back to the x-ray department the next day, to lie down in front of a camera.  The results of this will be sent to me.   Based on the results, i hope to order for you a treatment pill of radioactive iodine.  Although it is a larger amount of radiation, you will again notice no symptoms from this.  The pill is gone from your body in a few days (during which you should stay away from other people), but takes several months to work.  Therefore, please return here approximately 6-8 weeks after the treatment.  This treatment has been available for many years, and the only known side-effect is an underactive thyroid.  It is possible that i would eventually prescribe for you a thyroid hormone pill, which is very inexpensive.  You don't have to worry about side-effects of this thyroid hormone pill, because it is the same molecule your thyroid makes.
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
## 2017-06-18 LAB — TSH: TSH: <0.01 u[IU]/mL — ABNORMAL LOW (ref 0.35–4.50)

## 2017-06-18 LAB — T4, FREE: FREE T4: 0.93 ng/dL (ref 0.60–1.60)

## 2017-06-26 ENCOUNTER — Ambulatory Visit
Admission: RE | Admit: 2017-06-26 | Discharge: 2017-06-26 | Disposition: A | Discharge status: Home / Self Care | Payer: Medicare Other | Source: Ambulatory Visit | Attending: Endocrinology | Admitting: Endocrinology

## 2017-06-26 DIAGNOSIS — E0590 Thyrotoxicosis, unspecified without thyrotoxic crisis or storm: Secondary | ICD-10-CM

## 2017-06-30 ENCOUNTER — Telehealth: Payer: Self-pay | Admitting: Endocrinology

## 2017-06-30 NOTE — Telephone Encounter (Signed)
I have called & reviewed with patient. See result note.

## 2017-06-30 NOTE — Telephone Encounter (Signed)
Patient ask you to call her back with her lab results, if she not there leave a voice message.

## 2017-06-30 NOTE — Telephone Encounter (Signed)
Please call patient at ph# [phone] Re; Lab results and Thyroid Test (patient missed call from this number earlier)

## 2017-07-10 ENCOUNTER — Telehealth: Payer: Self-pay | Admitting: Endocrinology

## 2017-07-10 NOTE — Telephone Encounter (Signed)
°  Patient is calling about having the Radioactive iodine done. She states she has not heard from anyone about having this done and would like to know what is going on.   Please advise

## 2017-07-13 ENCOUNTER — Telehealth: Payer: Self-pay | Admitting: Endocrinology

## 2017-07-13 NOTE — Telephone Encounter (Signed)
Called patient and gave her the number for centralized scheduling so that she can find out why she has not been scheduled. Patient stated an understanding and had no questions at this time

## 2017-07-13 NOTE — Telephone Encounter (Signed)
Already ordered.  Please forward message to [facility].

## 2017-07-13 NOTE — Telephone Encounter (Signed)
Patient stated she would like for someone to call her concerning Radiation Treatment, said ellison need to put a order in.

## 2017-07-15 ENCOUNTER — Telehealth: Payer: Self-pay | Admitting: Endocrinology

## 2017-07-15 NOTE — Telephone Encounter (Signed)
Patient stated she is wanting schedule her radio active iodine . Veronica Perez had spoke with patient previously about this. She stated dr Loanne Drilling was also suppose to be referring her to someone for her thyroid but she is unsure if this has been done and who it was sent to  Please advise

## 2017-07-17 ENCOUNTER — Telehealth: Payer: Self-pay | Admitting: Endocrinology

## 2017-07-17 NOTE — Telephone Encounter (Signed)
Patient stated she called centralize scheduling that handles the radio active iodine treatment and they advised her to call our office that Dr Loanne Drilling is the one who would have to have this schedule  Please advise  Patient is becoming very upset because she states this has been taking to long and she needs this done asap. And she feels like she is getting pushed to the side

## 2017-07-17 NOTE — Telephone Encounter (Signed)
I am unsure where to send these NM orders? I have Veronica Perez, but he never got back with me on who to send them to?

## 2017-07-19 NOTE — Telephone Encounter (Signed)
Veronica Perez Will you please help me with this on at least get the auth if needed and I will call to [place]

## 2017-07-21 NOTE — Telephone Encounter (Signed)
Once again I don't know who to send these messages to? I never heard back from [place] on who was handling our NM orders?

## 2017-07-22 NOTE — Telephone Encounter (Signed)
I called LVM of time as well as day of appointments. I gave her the number she needed to call if she needed to change those days.

## 2017-07-22 NOTE — Telephone Encounter (Signed)
Pt is schedule for 6/27 and 28 at 845 both days please call the pt to let her know if this needs to be rescheduled please have her call [phone]

## 2017-07-30 ENCOUNTER — Encounter (HOSPITAL_COMMUNITY): Payer: Medicare Other | Attending: Endocrinology

## 2017-07-30 ENCOUNTER — Encounter (HOSPITAL_COMMUNITY): Payer: Medicare Other

## 2017-07-31 ENCOUNTER — Ambulatory Visit (HOSPITAL_COMMUNITY): Admission: RE | Admit: 2017-07-31 | Payer: Medicare Other | Source: Ambulatory Visit

## 2017-09-29 ENCOUNTER — Telehealth: Payer: Self-pay | Admitting: Nurse Practitioner

## 2017-09-29 ENCOUNTER — Telehealth: Payer: Self-pay | Admitting: Emergency Medicine

## 2017-09-29 NOTE — Telephone Encounter (Signed)
Spoke with the pt, advise the pt that Baldo Ash is unaware of medical correlation between canned food and thyroid issue (or if the pt needs to be in specific diet), unable to fill out form for the pt. Advise the pt to reach out to Dr. Loanne Drilling (managing her thyroid issue) if he is able to help with this. Pt stated she will call Dr. Loanne Drilling.     Copied from [place]. Topic: General - Other >> Sep 29, 2017 12:30 PM Reyne Dumas L wrote: Reason for CRM:   Pt states her landlord is going to be faxing over paperwork for physician to fill out - she has a freezer that she needs to use and the landlord needs note from physician that pt can't eat out of canned foods and she needs the freezer so she can keep frozen foods for strict diet due to thyroid issues.

## 2017-09-29 NOTE — Telephone Encounter (Signed)
Please advise on below  

## 2017-09-29 NOTE — Telephone Encounter (Signed)
Good news: you don't need any special diet, so no need for this.

## 2017-09-29 NOTE — Telephone Encounter (Signed)
Pt states her landlord is going to be faxing over paperwork for Dr Loanne Drilling to fill out. The landlord is trying to restrict the freezer from being in the town home so she has a freezer in her townhome that she needs to use. The landlord needs paperwork from Dr Loanne Drilling that pt prefers not to eat out of canned foods because of the iodine salt in them. She needs the freezer so she can keep frozen foods that she fixes a head of time for strict diet due to thyroid issues. Thanks

## 2017-09-30 NOTE — Telephone Encounter (Signed)
There is nothing I can do.  Please ask PCP

## 2017-09-30 NOTE — Telephone Encounter (Signed)
Patient called back & is very persistent that she needs note stating that she can keep freezer. She says that he nodules have disappeared after having x-rays since she has stopped eating out of cans. She states that she can fit nothing in her freezer healthy. I tried to help patient understand that this was not medially necessary. She did not seem to understand.

## 2017-09-30 NOTE — Telephone Encounter (Signed)
LVM stating that unfortunately there was nothing that Dr. Loanne Drilling can do. I know that PCP referred her to Endocrinology for this issue, but I asked that she refer to her daughter's pediatrician because patient stated that she also needed freezer for better food for her daughter's allergies.

## 2017-09-30 NOTE — Telephone Encounter (Signed)
LVM that patient needed no special diet so paperwork was unneeded. I stated if she had further questions she could call back our office.

## 2018-02-26 ENCOUNTER — Encounter (HOSPITAL_COMMUNITY): Payer: Self-pay

## 2018-02-26 ENCOUNTER — Ambulatory Visit (HOSPITAL_COMMUNITY)
Admission: EM | Admit: 2018-02-26 | Discharge: 2018-02-26 | Disposition: A | Discharge status: Home / Self Care | Payer: Medicare Other | Attending: Family Medicine | Admitting: Family Medicine

## 2018-02-26 DIAGNOSIS — M25511 Pain in right shoulder: Secondary | ICD-10-CM

## 2018-02-26 DIAGNOSIS — M7918 Myalgia, other site: Secondary | ICD-10-CM | POA: Insufficient documentation

## 2018-02-26 MED ORDER — KETOROLAC TROMETHAMINE 60 MG/2ML IM SOLN
60.0000 mg | Freq: Once | INTRAMUSCULAR | Status: AC
  Administered 2018-02-26: 60 mg via INTRAMUSCULAR

## 2018-02-26 MED ORDER — KETOROLAC TROMETHAMINE 60 MG/2ML IM SOLN
INTRAMUSCULAR | Status: AC
  Filled 2018-02-26: qty 2

## 2018-02-26 MED ORDER — IBUPROFEN 800 MG PO TABS: 800.0000 mg | ORAL_TABLET | Freq: Three times a day (TID) | ORAL | 0 refills | Status: DC

## 2018-02-26 MED ORDER — HYDROCODONE-ACETAMINOPHEN 5-325 MG PO TABS: 1.0000 | ORAL_TABLET | ORAL | 0 refills | Status: DC | PRN

## 2018-02-26 MED ORDER — TIZANIDINE HCL 4 MG PO TABS: 4.0000 mg | ORAL_TABLET | Freq: Four times a day (QID) | ORAL | 0 refills | Status: DC | PRN

## 2018-02-26 NOTE — ED Provider Notes (Signed)
[facility]    CSN: [id number] Arrival date & time: 02/26/18  1710     History   Chief Complaint Chief Complaint  Patient presents with  . Shoulder Pain    Right shoulder  . Appointment    545    HPI Veronica Perez is a 43 y.o. female.   HPI  Patient has pain in her shoulder that started 4 days ago.  She woke up with the pain.  No accident or injury.  No overuse.  No fall or trauma.  No prior neck or shoulder problems.  She states that it hurts terribly in her right neck, to the right tip of her shoulder, down the medial shoulder blade.  She states the muscle feels tight.  She has pain with movement.  She is tearful today because she has not slept and she states the pain is unremitting.  No numbness or weakness into the arm.  Past Medical History:  Diagnosis Date  . Bipolar disorder ([place])   . Depression   . Panic attacks   . Thyroid disease     Patient Active Problem List   Diagnosis Date Noted  . Hyperthyroidism 11/24/2016  . Bipolar disorder, current episode manic severe with psychotic features ([place]) 09/16/2016  . PTSD (post-traumatic stress disorder) 09/16/2016  . Panic disorder 09/16/2016  . Low TSH level 05/30/2016  . Heart palpitations 05/30/2016  . Bipolar I disorder, most recent episode (or current) manic ([place]) 12/22/2012  . 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 PANIC ATTACKS 04/02/2006  . TOBACCO DEPENDENCE 04/02/2006  . RHINITIS, ALLERGIC 04/02/2006  . GASTROESOPHAGEAL REFLUX, NO ESOPHAGITIS 04/02/2006    Past Surgical History:  Procedure Laterality Date  . ADENOIDECTOMY    . OVARIAN CYST SURGERY    . TONSILLECTOMY      OB History   No obstetric history on file.      Home Medications    Prior to Admission medications   Medication Sig Start Date End Date Taking? Authorizing Provider  HYDROcodone-acetaminophen (NORCO/VICODIN) 5-325 MG tablet Take 1 tablet by mouth every 4 (four) hours as needed.  02/26/18   Raylene Everts, MD  ibuprofen (ADVIL,MOTRIN) 800 MG tablet Take 1 tablet (800 mg total) by mouth 3 (three) times daily. 02/26/18   Raylene Everts, MD  LORazepam (ATIVAN) 1 MG tablet Take 1 mg by mouth 3 (three) times daily. 11/11/16   [provider]  tiZANidine (ZANAFLEX) 4 MG tablet Take 1-2 tablets (4-8 mg total) by mouth every 6 (six) hours as needed for muscle spasms. 02/26/18   Raylene Everts, MD    Family History Family History  Problem Relation Age of Onset  . Diabetes Father   . Heart disease Father   . Cancer Maternal Grandmother        breast cancer  . Bipolar disorder Mother   . Thyroid disease Neg Hx     Social History Social History   Tobacco Use  . Smoking status: Former Smoker    Last attempt to quit: 01/18/2012    Years since quitting: 6.1  . Smokeless tobacco: Never Used  Substance Use Topics  . Alcohol use: No  . Drug use: No     Allergies   Penicillins   Review of Systems Review of Systems  Constitutional: Negative for chills and fever.  HENT: Negative for ear pain and sore throat.   Eyes: Negative for pain and visual disturbance.  Respiratory: Negative for cough  and shortness of breath.   Cardiovascular: Negative for chest pain and palpitations.  Gastrointestinal: Negative for abdominal pain and vomiting.  Genitourinary: Negative for dysuria and hematuria.  Musculoskeletal: Positive for neck pain and neck stiffness. Negative for arthralgias and back pain.  Skin: Negative for color change and rash.  Neurological: Negative for seizures and syncope.  All other systems reviewed and are negative.    Physical Exam Triage Vital Signs ED Triage Vitals  Enc Vitals Group     BP 02/26/18 1741 119/76     Pulse Rate 02/26/18 1741 83     Resp 02/26/18 1741 16     Temp 02/26/18 1741 97.9 F (36.6 C)     Temp Source 02/26/18 1741 Oral     SpO2 02/26/18 1741 99 %     Weight --      Height --      Head Circumference --       Peak Flow --      Pain Score 02/26/18 1743 8     Pain Loc --      Pain Edu? --      Excl. in [place]? --    No data found.  Updated Vital Signs BP 119/76 (BP Location: Left Arm)   Pulse 83   Temp 97.9 F (36.6 C) (Oral)   Resp 16   LMP 02/26/2018   SpO2 99%       Physical Exam Constitutional:      General: She is in acute distress.     Appearance: She is well-developed.     Comments: Writhing, holding neck, rocking back and forth  HENT:     Head: Normocephalic and atraumatic.  Eyes:     Conjunctiva/sclera: Conjunctivae normal.     Pupils: Pupils are equal, round, and reactive to light.  Neck:     Musculoskeletal: Normal range of motion.  Cardiovascular:     Rate and Rhythm: Normal rate.  Pulmonary:     Effort: Pulmonary effort is normal. No respiratory distress.  Abdominal:     General: There is no distension.     Palpations: Abdomen is soft.  Musculoskeletal: Normal range of motion.       Back:     Comments: Tenderness in the right upper body of the trapezius muscle.  Pain with shock of shoulder.  Pain with neck rotation towards right.  Strength sensation range of motion reflexes are normal in both upper extremities symmetrically  Skin:    General: Skin is warm and dry.  Neurological:     Mental Status: She is alert.      UC Treatments / Results  Labs (all labs ordered are listed, but only abnormal results are displayed) Labs Reviewed - No data to display  EKG None  Radiology No results found.  Procedures Procedures (including critical care time)  Medications Ordered in UC Medications  ketorolac (TORADOL) injection 60 mg (60 mg Intramuscular Given 02/26/18 1857)    Initial Impression / Assessment and Plan / UC Course  I have reviewed the triage vital signs and the nursing notes.  Pertinent labs & imaging results that were available during my care of the patient were reviewed by me and considered in my medical decision making (see chart for  details).     Felt better after Toradol injection.  Discussed conservative care.  Discussed different etiology.  Conservative management likely to improvement in a matter of days Final Clinical Impressions(s) / UC Diagnoses   Final diagnoses:  Acute  myofascial pain     Discharge Instructions     Use ice to area Take ibuprofen 3 x a day with food Add tizanidine as muscle relaxer as needed If needed, if pain severe, may take hydrocodone in addition This may cause drowsiness, do not drive Do not take with clonazepam   ED Prescriptions    Medication Sig Dispense Auth. Provider   ibuprofen (ADVIL,MOTRIN) 800 MG tablet Take 1 tablet (800 mg total) by mouth 3 (three) times daily. 21 tablet Raylene Everts, MD   tiZANidine (ZANAFLEX) 4 MG tablet Take 1-2 tablets (4-8 mg total) by mouth every 6 (six) hours as needed for muscle spasms. 21 tablet Raylene Everts, MD   HYDROcodone-acetaminophen (NORCO/VICODIN) 5-325 MG tablet Take 1 tablet by mouth every 4 (four) hours as needed. 10 tablet Raylene Everts, MD     Controlled Substance Prescriptions [place] Controlled Substance Registry consulted? Yes, I have consulted the [place] Controlled Substances Registry for this patient, and feel the risk/benefit ratio today is favorable for proceeding with this prescription for a controlled substance.   Raylene Everts, MD 02/26/18 2107

## 2018-02-26 NOTE — Discharge Instructions (Signed)
Use ice to area Take ibuprofen 3 x a day with food Add tizanidine as muscle relaxer as needed If needed, if pain severe, may take hydrocodone in addition This may cause drowsiness, do not drive Do not take with clonazepam

## 2018-02-26 NOTE — ED Triage Notes (Signed)
Pt present right shoulder pain that started 4 days ago. Pt denies any injury to her shoulder pain. Pt has tried OTC medication with no relief.

## 2018-03-01 ENCOUNTER — Encounter: Payer: Self-pay | Admitting: Nurse Practitioner

## 2018-03-01 ENCOUNTER — Ambulatory Visit: Payer: Self-pay | Admitting: Nurse Practitioner

## 2018-03-01 ENCOUNTER — Ambulatory Visit (INDEPENDENT_AMBULATORY_CARE_PROVIDER_SITE_OTHER): Payer: Medicare Other | Admitting: Nurse Practitioner

## 2018-03-01 VITALS — BP 122/78 | HR 78 | Temp 97.6°F | Ht 64.0 in | Wt 213.8 lb

## 2018-03-01 DIAGNOSIS — B029 Zoster without complications: Secondary | ICD-10-CM

## 2018-03-01 DIAGNOSIS — M549 Dorsalgia, unspecified: Secondary | ICD-10-CM | POA: Diagnosis not present

## 2018-03-01 MED ORDER — LIDOCAINE 5 % EX PTCH: 1.0000 | MEDICATED_PATCH | CUTANEOUS | 0 refills | Status: DC

## 2018-03-01 MED ORDER — GABAPENTIN 100 MG PO CAPS: ORAL_CAPSULE | ORAL | 0 refills | Status: DC

## 2018-03-01 MED ORDER — VALACYCLOVIR HCL 1 G PO TABS
1000.0000 mg | ORAL_TABLET | Freq: Three times a day (TID) | ORAL | 0 refills | Status: DC
Start: 2018-03-01 — End: 2019-05-27

## 2018-03-01 NOTE — Progress Notes (Signed)
Subjective:  Patient ID: Veronica Perez, female    DOB: 1975-02-18  Age: 43 y.o. MRN: [id number]  CC: Shoulder Pain (pt is complainin gof right shoulder pain,painful when with movement/going on 1 wk/tried exercised,cream/ went to urgent care--got hydrocodone and steroid shot. )  Rash  This is a new problem. The current episode started in the past 7 days. The problem is unchanged. The affected locations include the back. The rash is characterized by burning, blistering, pain, redness and swelling. It is unknown if there was an exposure to a precipitant. Associated symptoms include joint pain. Pertinent negatives include no fatigue, fever or shortness of breath. Past treatments include analgesics. The treatment provided mild relief. Her past medical history is significant for varicella.   Reviewed past Medical, Social and Family history today.  Outpatient Medications Prior to Visit  Medication Sig Dispense Refill  . HYDROcodone-acetaminophen (NORCO/VICODIN) 5-325 MG tablet Take 1 tablet by mouth every 4 (four) hours as needed. 10 tablet 0  . ibuprofen (ADVIL,MOTRIN) 800 MG tablet Take 1 tablet (800 mg total) by mouth 3 (three) times daily. 21 tablet 0  . LORazepam (ATIVAN) 1 MG tablet Take 1 mg by mouth 3 (three) times daily.  1  . tiZANidine (ZANAFLEX) 4 MG tablet Take 1-2 tablets (4-8 mg total) by mouth every 6 (six) hours as needed for muscle spasms. 21 tablet 0   No facility-administered medications prior to visit.     ROS See HPI  Objective:  BP 122/78   Pulse 78   Temp 97.6 F (36.4 C) (Oral)   Ht 5\' 4"  (1.626 m)   Wt 213 lb 12.8 oz (97 kg)   LMP 02/26/2018   SpO2 98%   BMI 36.70 kg/m   BP Readings from Last 3 Encounters:  03/01/18 122/78  02/26/18 119/76  06/17/17 132/66    Wt Readings from Last 3 Encounters:  03/01/18 213 lb 12.8 oz (97 kg)  06/17/17 204 lb (92.5 kg)  04/16/17 199 lb (90.3 kg)    Physical Exam Vitals signs reviewed.  Cardiovascular:   Rate and Rhythm: Normal rate.  Pulmonary:     Effort: Pulmonary effort is normal.  Skin:    Findings: Erythema and rash present.       Neurological:     Mental Status: She is alert and oriented to person, place, and time.     Lab Results  Component Value Date   WBC 10.7 (H) 09/29/2016   HGB 12.9 09/29/2016   HCT 37.9 09/29/2016   PLT 275 09/29/2016   GLUCOSE 89 09/29/2016   CHOL 105 12/11/2016   TRIG 120.0 12/11/2016   HDL 25.70 (L) 12/11/2016   LDLCALC 56 12/11/2016   ALT 24 09/13/2016   AST 20 09/13/2016   NA 138 09/29/2016   K 3.6 09/29/2016   CL 105 09/29/2016   CREATININE 0.60 09/29/2016   BUN 9 09/29/2016   CO2 24 09/29/2016   TSH <0.01 (L) 06/17/2017   HGBA1C 4.9 09/17/2016    Assessment & Plan:   Veronica Perez was seen today for shoulder pain.  Diagnoses and all orders for this visit:  Herpes zoster without complication -     valACYclovir (VALTREX) 1000 MG tablet; Take 1 tablet (1,000 mg total) by mouth 3 (three) times daily. -     gabapentin (NEURONTIN) 100 MG capsule; Take 1 capsule (100 mg total) by mouth at bedtime for 3 days, THEN 1 capsule (100 mg total) 2 (two) times daily for 3 days, THEN  1 capsule (100 mg total) 2 (two) times daily for 14 days. -     lidocaine (LIDODERM) 5 %; Place 1 patch onto the skin daily. Remove & Discard patch within 12 hours or as directed by MD  Upper back pain on right side -     valACYclovir (VALTREX) 1000 MG tablet; Take 1 tablet (1,000 mg total) by mouth 3 (three) times daily. -     gabapentin (NEURONTIN) 100 MG capsule; Take 1 capsule (100 mg total) by mouth at bedtime for 3 days, THEN 1 capsule (100 mg total) 2 (two) times daily for 3 days, THEN 1 capsule (100 mg total) 2 (two) times daily for 14 days. -     lidocaine (LIDODERM) 5 %; Place 1 patch onto the skin daily. Remove & Discard patch within 12 hours or as directed by MD   I am having Veronica Perez start on valACYclovir, gabapentin, and lidocaine. I am also  having her maintain her LORazepam, ibuprofen, tiZANidine, and HYDROcodone-acetaminophen.  Meds ordered this encounter  Medications  . valACYclovir (VALTREX) 1000 MG tablet    Sig: Take 1 tablet (1,000 mg total) by mouth 3 (three) times daily.    Dispense:  21 tablet    Refill:  0    Order Specific Question:   Supervising Provider    Answer:   MATTHEWS, CODY [[id number]]  . gabapentin (NEURONTIN) 100 MG capsule    Sig: Take 1 capsule (100 mg total) by mouth at bedtime for 3 days, THEN 1 capsule (100 mg total) 2 (two) times daily for 3 days, THEN 1 capsule (100 mg total) 2 (two) times daily for 14 days.    Dispense:  90 capsule    Refill:  0    Order Specific Question:   Supervising Provider    Answer:   MATTHEWS, CODY [[id number]]  . lidocaine (LIDODERM) 5 %    Sig: Place 1 patch onto the skin daily. Remove & Discard patch within 12 hours or as directed by MD    Dispense:  14 patch    Refill:  0    Order Specific Question:   Supervising Provider    Answer:   MATTHEWS, CODY [[id number]]    Problem List Items Addressed This Visit    None    Visit Diagnoses    Herpes zoster without complication    -  Primary   Relevant Medications   valACYclovir (VALTREX) 1000 MG tablet   gabapentin (NEURONTIN) 100 MG capsule   lidocaine (LIDODERM) 5 %   Upper back pain on right side       Relevant Medications   valACYclovir (VALTREX) 1000 MG tablet   gabapentin (NEURONTIN) 100 MG capsule   lidocaine (LIDODERM) 5 %       Follow-up: No follow-ups on file.  Wilfred Lacy, NP

## 2018-03-01 NOTE — Patient Instructions (Signed)
Avoid hot showers. May use cold compress as needed (alternate with lidocaine patch)  Shingles  Shingles, which is also known as herpes zoster, is an infection that causes a painful skin rash and fluid-filled blisters. It is caused by a virus. Shingles only develops in people who:  Have had chickenpox.  Have been given a medicine to protect against chickenpox (have been vaccinated). Shingles is rare in this group. What are the causes? Shingles is caused by varicella-zoster virus (VZV). This is the same virus that causes chickenpox. After a person is exposed to VZV, the virus stays in the body in an inactive (dormant) state. Shingles develops if the virus is reactivated. This can happen many years after the first (initial) exposure to VZV. It is not known what causes this virus to be reactivated. What increases the risk? People who have had chickenpox or received the chickenpox vaccine are at risk for shingles. Shingles infection is more common in people who:  Are older than age 69.  Have a weakened disease-fighting system (immune system), such as people with: ? HIV. ? AIDS. ? Cancer.  Are taking medicines that weaken the immune system, such as transplant medicines.  Are experiencing a lot of stress. What are the signs or symptoms? Early symptoms of this condition include itching, tingling, and pain in an area on your skin. Pain may be described as burning, stabbing, or throbbing. A few days or weeks after early symptoms start, a painful red rash appears. The rash is usually on one side of the body and has a band-like or belt-like pattern. The rash eventually turns into fluid-filled blisters that break open, change into scabs, and dry up in about 2-3 weeks. At any time during the infection, you may also develop:  A fever.  Chills.  A headache.  An upset stomach. How is this diagnosed? This condition is diagnosed with a skin exam. Skin or fluid samples may be taken from the  blisters before a diagnosis is made. These samples are examined under a microscope or sent to a lab for testing. How is this treated? The rash may last for several weeks. There is not a specific cure for this condition. Your health care provider will probably prescribe medicines to help you manage pain, recover more quickly, and avoid long-term problems. Medicines may include:  Antiviral drugs.  Anti-inflammatory drugs.  Pain medicines.  Anti-itching medicines (antihistamines). If the area involved is on your face, you may be referred to a specialist, such as an eye doctor (ophthalmologist) or an ear, nose, and throat (ENT) doctor (otolaryngologist) to help you avoid eye problems, chronic pain, or disability. Follow these instructions at home: Medicines  Take over-the-counter and prescription medicines only as told by your health care provider.  Apply an anti-itch cream or numbing cream to the affected area as told by your health care provider. Relieving itching and discomfort   Apply cold, wet cloths (cold compresses) to the area of the rash or blisters as told by your health care provider.  Cool baths can be soothing. Try adding baking soda or dry oatmeal to the water to reduce itching. Do not bathe in hot water. Blister and rash care  Keep your rash covered with a loose bandage (dressing). Wear loose-fitting clothing to help ease the pain of material rubbing against the rash.  Keep your rash and blisters clean by washing the area with mild soap and cool water as told by your health care provider.  Check your rash every day  for signs of infection. Check for: ? More redness, swelling, or pain. ? Fluid or blood. ? Warmth. ? Pus or a bad smell.  Do not scratch your rash or pick at your blisters. To help avoid scratching: ? Keep your fingernails clean and cut short. ? Wear gloves or mittens while you sleep, if scratching is a problem. General instructions  Rest as told by your  health care provider.  Keep all follow-up visits as told by your health care provider. This is important.  Wash your hands often with soap and water. If soap and water are not available, use hand sanitizer. Doing this lowers your chance of getting a bacterial skin infection.  Before your blisters change into scabs, your shingles infection can cause chickenpox in people who have never had it or have never been vaccinated against it. To prevent this from happening, avoid contact with other people, especially: ? Babies. ? Pregnant women. ? Children who have eczema. ? Elderly people who have transplants. ? People who have chronic illnesses, such as cancer or AIDS. Contact a health care provider if:  Your pain is not relieved with prescribed medicines.  Your pain does not get better after the rash heals.  You have signs of infection in the rash area, such as: ? More redness, swelling, or pain around the rash. ? Fluid or blood coming from the rash. ? The rash area feeling warm to the touch. ? Pus or a bad smell coming from the rash. Get help right away if:  The rash is on your face or nose.  You have facial pain, pain around your eye area, or loss of feeling on one side of your face.  You have difficulty seeing.  You have ear pain or have ringing in your ear.  You have a loss of taste.  Your condition gets worse. Summary  Shingles, which is also known as herpes zoster, is an infection that causes a painful skin rash and fluid-filled blisters.  This condition is diagnosed with a skin exam. Skin or fluid samples may be taken from the blisters and examined before the diagnosis is made.  Keep your rash covered with a loose bandage (dressing). Wear loose-fitting clothing to help ease the pain of material rubbing against the rash.  Before your blisters change into scabs, your shingles infection can cause chickenpox in people who have never had it or have never been vaccinated against  it. This information is not intended to replace advice given to you by your health care provider. Make sure you discuss any questions you have with your health care provider. Document Released: 01/20/2005 Document Revised: 09/24/2016 Document Reviewed: 09/24/2016 Elsevier Interactive Patient Education  2019 [employer].

## 2018-03-02 ENCOUNTER — Other Ambulatory Visit: Payer: Self-pay | Admitting: Nurse Practitioner

## 2018-03-02 DIAGNOSIS — B029 Zoster without complications: Secondary | ICD-10-CM

## 2018-03-02 DIAGNOSIS — M549 Dorsalgia, unspecified: Secondary | ICD-10-CM

## 2018-03-02 NOTE — Telephone Encounter (Signed)
Copied from [place] [phone]. Topic: Quick Communication - Rx Refill/Question >> Mar 02, 2018 12:46 PM Windy Kalata wrote: Medication: lidocaine (LIDODERM) 5 %  Has the patient contacted their pharmacy? Yes.   (Agent: If no, request that the patient contact the pharmacy for the refill.) (Agent: If yes, when and what did the pharmacy advise?) Call the office  Preferred Pharmacy (with phone number or street name): [id number] - [place], [place] [phone] (Phone) [phone] (Fax)    Agent: Please be advised that RX refills may take up to 3 business days. We ask that you follow-up with your pharmacy.

## 2018-03-11 ENCOUNTER — Other Ambulatory Visit: Payer: Self-pay

## 2018-03-11 ENCOUNTER — Ambulatory Visit (HOSPITAL_COMMUNITY)
Admission: EM | Admit: 2018-03-11 | Discharge: 2018-03-11 | Disposition: A | Discharge status: Home / Self Care | Payer: Medicare Other | Attending: Family Medicine | Admitting: Family Medicine

## 2018-03-11 ENCOUNTER — Encounter (HOSPITAL_COMMUNITY): Payer: Self-pay

## 2018-03-11 DIAGNOSIS — Z113 Encounter for screening for infections with a predominantly sexual mode of transmission: Secondary | ICD-10-CM

## 2018-03-11 DIAGNOSIS — N3090 Cystitis, unspecified without hematuria: Secondary | ICD-10-CM | POA: Diagnosis not present

## 2018-03-11 LAB — POCT URINALYSIS DIP (DEVICE)
GLUCOSE, UA: NEGATIVE mg/dL
Ketones, ur: 15 mg/dL — AB
NITRITE: NEGATIVE
PH: 5.5 (ref 5.0–8.0)
PROTEIN: 30 mg/dL — AB
Specific Gravity, Urine: 1.02 (ref 1.005–1.030)
UROBILINOGEN UA: 0.2 mg/dL (ref 0.0–1.0)

## 2018-03-11 LAB — POCT PREGNANCY, URINE: PREG TEST UR: NEGATIVE

## 2018-03-11 MED ORDER — SULFAMETHOXAZOLE-TRIMETHOPRIM 800-160 MG PO TABS: 1.0000 | ORAL_TABLET | Freq: Two times a day (BID) | ORAL | 0 refills | Status: AC

## 2018-03-11 NOTE — Discharge Instructions (Signed)
You have been given the following medications today for treatment of suspected gonorrhea and/or chlamydia:  cefTRIAXone (ROCEPHIN) injection 250 mg azithromycin (ZITHROMAX) tablet 1,000 mg  Even though we have treated you today, we have sent testing for sexually transmitted infections. We will notify you of any positive results once they are received. If required, we will prescribe any medications you might need.  Please refrain from all sexual activity for at least the next seven days.

## 2018-03-11 NOTE — ED Triage Notes (Signed)
Pt cc she thinks she may have a UTI or an STD. Pt would like to be checked. X 13 days it burns when she voids pt states it hurts.

## 2018-03-14 LAB — URINE CULTURE: Culture: >=100000 — AB

## 2018-03-14 LAB — CERVICOVAGINAL ANCILLARY ONLY
Bacterial vaginitis: NEGATIVE
CANDIDA VAGINITIS: POSITIVE — AB
Chlamydia: NEGATIVE
NEISSERIA GONORRHEA: NEGATIVE
TRICH (WINDOWPATH): NEGATIVE

## 2018-03-16 ENCOUNTER — Telehealth (HOSPITAL_COMMUNITY): Payer: Self-pay | Admitting: Emergency Medicine

## 2018-03-16 ENCOUNTER — Ambulatory Visit: Payer: Self-pay | Admitting: Nurse Practitioner

## 2018-03-16 NOTE — Telephone Encounter (Signed)
Culture treated with sulfa/trimeth. Attempted to reach patient. No answer at this time.

## 2018-03-17 NOTE — ED Provider Notes (Signed)
Veronica Perez   [id number] 03/11/18 Arrival Time: 1194  ASSESSMENT & PLAN:  1. Cystitis   2. Screening examination for STD (sexually transmitted disease)    Meds ordered this encounter  Medications  . sulfamethoxazole-trimethoprim (BACTRIM DS,SEPTRA DS) 800-160 MG tablet    Sig: Take 1 tablet by mouth 2 (two) times daily for 5 days.    Dispense:  10 tablet    Refill:  0   Will treat for UTI. Urine culture sent. Vaginal cytology pending. Declines HIV/RPR.  Pending: Labs Reviewed  POCT URINALYSIS DIP (DEVICE) - Abnormal; Notable for the following components:   Bilirubin Urine SMALL (*)    Ketones, ur 15 (*)    Hgb urine dipstick LARGE (*)    Protein, ur 30 (*)    Leukocytes, UA LARGE (*)    All other components within normal limits  POCT PREGNANCY, URINE   Will notify of any positive results. Instructed to refrain from sexual activity for at least seven days.  Reviewed expectations re: course of current medical issues. Questions answered. Outlined signs and symptoms indicating need for more acute intervention. Patient verbalized understanding. After Visit Summary given.   SUBJECTIVE:  Veronica Perez is a 43 y.o. female who presents with complaint of urinary frequency and dysuria on and off for the past 1-2 weeks. No vaginal discharge but requests STI screening. Afebrile. No abdominal or pelvic pain. No flank pain or hematuria. Normal PO intake. Normal bowel habits. Ambulatory without difficulty. No n/v. No rashes or lesions. Sexually active with single female partner. OTC treatment: none.  Patient's last menstrual period was 02/26/2018.  ROS: As per HPI. All other systems negative.  OBJECTIVE:  Vitals:   03/11/18 1707 03/11/18 1709  BP:  132/88  Pulse:  78  Resp:  18  Temp:  98.4 F (36.9 C)  TempSrc:  Oral  SpO2:  100%  Weight: 88 kg      General appearance: alert, cooperative, appears stated age and no distress Throat: lips, mucosa, and tongue  normal; teeth and gums normal CV: RRR Lungs: CTAB Back: no CVA tenderness; FROM at waist Abdomen: soft, non-tender; no guarding or rebound tenderness GU: deferred Skin: warm and dry Psychological: alert and cooperative; normal mood and affect.   Allergies  Allergen Reactions  . Penicillins Hives, Itching, Nausea And Vomiting and Other (See Comments)    Has patient had a PCN reaction causing immediate rash, facial/tongue/throat swelling, SOB or lightheadedness with hypotension: No Has patient had a PCN reaction causing severe rash involving mucus membranes or skin necrosis: No Has patient had a PCN reaction that required hospitalization: No Has patient had a PCN reaction occurring within the last 10 years: No If all of the above answers are "NO", then may proceed with Cephalosporin use.    Past Medical History:  Diagnosis Date  . Bipolar disorder ([place])   . Depression   . Panic attacks   . Thyroid disease    Family History  Problem Relation Age of Onset  . Diabetes Father   . Heart disease Father   . Cancer Maternal Grandmother        breast cancer  . Bipolar disorder Mother   . Thyroid disease Neg Hx    Social History   Socioeconomic History  . Marital status: Single    Spouse name: Not on file  . Number of children: Not on file  . Years of education: Not on file  . Highest education level: Not on file  Occupational History  . Not on file  Social Needs  . Financial resource strain: Not on file  . Food insecurity:    Worry: Not on file    Inability: Not on file  . Transportation needs:    Medical: Not on file    Non-medical: Not on file  Tobacco Use  . Smoking status: Former Smoker    Last attempt to quit: 01/18/2012    Years since quitting: 6.1  . Smokeless tobacco: Never Used  Substance and Sexual Activity  . Alcohol use: No  . Drug use: No  . Sexual activity: Not on file  Lifestyle  . Physical activity:    Days per week: Not on file    Minutes per  session: Not on file  . Stress: Not on file  Relationships  . Social connections:    Talks on phone: Not on file    Gets together: Not on file    Attends religious service: Not on file    Active member of club or organization: Not on file    Attends meetings of clubs or organizations: Not on file    Relationship status: Not on file  . Intimate partner violence:    Fear of current or ex partner: Not on file    Emotionally abused: Not on file    Physically abused: Not on file    Forced sexual activity: Not on file  Other Topics Concern  . Not on file  Social History Narrative  . Not on file          Vanessa Kick, MD 03/17/18 1351

## 2018-06-02 ENCOUNTER — Telehealth: Payer: Self-pay | Admitting: Nurse Practitioner

## 2018-06-02 NOTE — Telephone Encounter (Signed)
I called and left message on patient voicemail per Wilfred Lacy to call office and schedule follow up virtual visit for thyroid dysfunction.

## 2018-06-14 ENCOUNTER — Encounter: Payer: Self-pay | Admitting: Nurse Practitioner

## 2018-06-14 ENCOUNTER — Ambulatory Visit (INDEPENDENT_AMBULATORY_CARE_PROVIDER_SITE_OTHER): Payer: Medicare Other | Admitting: Nurse Practitioner

## 2018-06-14 VITALS — BP 129/75 | HR 75 | Ht 64.0 in | Wt 211.2 lb

## 2018-06-14 DIAGNOSIS — R350 Frequency of micturition: Secondary | ICD-10-CM

## 2018-06-14 DIAGNOSIS — Z7251 High risk heterosexual behavior: Secondary | ICD-10-CM | POA: Diagnosis not present

## 2018-06-14 DIAGNOSIS — E0590 Thyrotoxicosis, unspecified without thyrotoxic crisis or storm: Secondary | ICD-10-CM | POA: Diagnosis not present

## 2018-06-14 DIAGNOSIS — R140 Abdominal distension (gaseous): Secondary | ICD-10-CM

## 2018-06-14 LAB — TSH: TSH: <0.01 u[IU]/mL — ABNORMAL LOW (ref 0.35–4.50)

## 2018-06-14 LAB — HCG, QUANTITATIVE, PREGNANCY: Quantitative HCG: <0.6 m[IU]/mL

## 2018-06-14 LAB — T4, FREE: Free T4: 1.25 ng/dL (ref 0.60–1.60)

## 2018-06-14 NOTE — Progress Notes (Signed)
Virtual Visit via Video Note  I connected with Veronica Perez on 06/15/18 at 10:15 AM EDT by a video enabled telemedicine application and verified that I am speaking with the correct person using two identifiers.  Location: Patient: Home Provider: Office   I discussed the limitations of evaluation and management by telemedicine and the availability of in person appointments. The patient expressed understanding and agreed to proceed.  CC: pt is complaining of abd swelling,tight,bloated,feels heavy,pressure--going on 1 wk--mention gain 5-10 lb in 1 wk(yesterday weight was 210 LB)-- had unprotected sex--neg home preg test/ mention of Left breast feels weird sensation.   History of Present Illness: Abdominal Pain  This is a new problem. The current episode started 1 to 4 weeks ago. The onset quality is gradual. The problem occurs constantly. The problem has been unchanged. The pain is located in the generalized abdominal region. The quality of the pain is a sensation of fullness. The abdominal pain does not radiate. Associated symptoms include flatus and frequency. Pertinent negatives include no anorexia, belching, constipation, diarrhea, dysuria, headaches, hematochezia, hematuria, melena, myalgias, nausea, vomiting or weight loss. Nothing aggravates the pain. The pain is relieved by nothing. There is no history of gallstones or GERD.  LMP 06/07/2018 Unprotected intercourse, no other method of contraception.   Wt Readings from Last 3 Encounters:  06/14/18 211 lb 2.6 oz (95.8 kg)  03/11/18 194 lb (88 kg)  03/01/18 213 lb 12.8 oz (97 kg)   Observations/Objective: No LE edema noted during video call Physical Exam  Constitutional: She is oriented to person, place, and time. No distress.  Pulmonary/Chest: Effort normal.  Neurological: She is alert and oriented to person, place, and time.  Vitals reviewed.  Assessment and Plan: Veronica Perez was seen today for follow-up.  Diagnoses and all  orders for this visit:  Unprotected sexual intercourse -     B-HCG Quant -     Urine cytology ancillary only([place]) -     HIV antibody (with reflex)  Generalized bloating -     B-HCG Quant -     simethicone (GAS-X) 80 MG chewable tablet; Chew 1 tablet (80 mg total) by mouth every 6 (six) hours as needed for flatulence.  Urinary frequency -     B-HCG Quant -     Urinalysis w microscopic + reflex cultur -     Urine cytology ancillary only([place]) -     HIV antibody (with reflex)  Hyperthyroidism -     TSH -     T4, free  Other orders -     REFLEXIVE URINE CULTURE    Follow Up Instructions: Go to lab for blood draw and urine collection   I discussed the assessment and treatment plan with the patient. The patient was provided an opportunity to ask questions and all were answered. The patient agreed with the plan and demonstrated an understanding of the instructions.   The patient was advised to call back or seek an in-person evaluation if the symptoms worsen or if the condition fails to improve as anticipated.   Wilfred Lacy, NP

## 2018-06-15 ENCOUNTER — Encounter: Payer: Self-pay | Admitting: Nurse Practitioner

## 2018-06-15 LAB — URINALYSIS W MICROSCOPIC + REFLEX CULTURE
Bacteria, UA: NONE SEEN /HPF
Bilirubin Urine: NEGATIVE
Glucose, UA: NEGATIVE
Hgb urine dipstick: NEGATIVE
Hyaline Cast: NONE SEEN /LPF
Ketones, ur: NEGATIVE
Leukocyte Esterase: NEGATIVE
Nitrites, Initial: NEGATIVE
Protein, ur: NEGATIVE
RBC / HPF: NONE SEEN /HPF (ref 0–2)
Specific Gravity, Urine: 1.018 (ref 1.001–1.03)
pH: <=5 (ref 5.0–8.0)

## 2018-06-15 LAB — NO CULTURE INDICATED

## 2018-06-15 LAB — HIV ANTIBODY (ROUTINE TESTING W REFLEX): HIV 1&2 Ab, 4th Generation: NONREACTIVE

## 2018-06-15 MED ORDER — SIMETHICONE 80 MG PO CHEW: 80.0000 mg | CHEWABLE_TABLET | Freq: Four times a day (QID) | ORAL | 0 refills | Status: DC | PRN

## 2018-06-15 NOTE — Patient Instructions (Signed)
Normal urinalysis and negative pregnancy. Persistent thyroid dysfunction. F/up with endocrinology. Pending urine cytology. Use gas-X for ABD bloating. Maintain low sodium diet and adequate oral hydration (water)

## 2018-06-17 ENCOUNTER — Telehealth: Payer: Self-pay | Admitting: Nurse Practitioner

## 2018-06-17 NOTE — Telephone Encounter (Signed)
Hard bump on right eye lid closer to her nose, has had for a long while but recently has gotten bigger and it is irritating her eye, she wants to have it looked at. I wasn't sure if you would want to see in person or through virtual visit.

## 2018-06-17 NOTE — Telephone Encounter (Signed)
appt made

## 2018-06-17 NOTE — Telephone Encounter (Signed)
Charlotte please advise, are you able to see this through doxy?

## 2018-06-17 NOTE — Telephone Encounter (Signed)
Schedule video call

## 2018-06-18 ENCOUNTER — Encounter: Payer: Self-pay | Admitting: Nurse Practitioner

## 2018-06-18 ENCOUNTER — Ambulatory Visit (INDEPENDENT_AMBULATORY_CARE_PROVIDER_SITE_OTHER): Payer: Medicare Other | Admitting: Nurse Practitioner

## 2018-06-18 VITALS — BP 97/58 | HR 74 | Ht 64.0 in | Wt 203.8 lb

## 2018-06-18 DIAGNOSIS — H0013 Chalazion right eye, unspecified eyelid: Secondary | ICD-10-CM | POA: Diagnosis not present

## 2018-06-18 NOTE — Progress Notes (Signed)
Virtual Visit via Video Note  I connected with Veronica Perez on 06/18/18 at  3:30 PM EDT by a video enabled telemedicine application and verified that I am speaking with the correct person using two identifiers.  Location: Patient:Home Provider: Office   I discussed the limitations of evaluation and management by telemedicine and the availability of in person appointments. The patient expressed understanding and agreed to proceed.  History of Present Illness:Eye Problem   The right eye is affected. This is a chronic problem. The current episode started more than 1 year ago. The problem occurs constantly. The problem has been waxing and waning. There was no injury mechanism. There is no known exposure to pink eye. She does not wear contacts. Associated symptoms include an eye discharge, a foreign body sensation and itching. Pertinent negatives include no blurred vision, double vision, eye redness, fever, nausea, photophobia, recent URI or vomiting. She has tried nothing for the symptoms.  increased tearing dur to irritation   Observations/Objective: Physical Exam  Constitutional: She is oriented to person, place, and time. No distress.  Eyes: Conjunctivae and EOM are normal. Right eye exhibits no chemosis and no hordeolum. Left eye exhibits no chemosis, no discharge, no exudate and no hordeolum. No scleral icterus.    Neck: Normal range of motion. Neck supple.  Pulmonary/Chest: Effort normal.  Neurological: She is alert and oriented to person, place, and time.  Skin: No rash noted. No erythema.   Assessment and Plan: Veronica Perez was seen today for cyst.  Diagnoses and all orders for this visit:  Chalazion of right eye, unspecified eyelid -     Ambulatory referral to Ophthalmology   Follow Up Instructions: Use warm compress as needed (21mins at a time). You will be contacted to schedule appt with ophthalmology. Call office if any signs of infection (redness, purulent drainage,  swelling, pain, change in vision)   I discussed the assessment and treatment plan with the patient. The patient was provided an opportunity to ask questions and all were answered. The patient agreed with the plan and demonstrated an understanding of the instructions.   The patient was advised to call back or seek an in-person evaluation if the symptoms worsen or if the condition fails to improve as anticipated.   Wilfred Lacy, NP

## 2018-06-18 NOTE — Patient Instructions (Signed)
Use warm compress as needed (12mins at a time). You will be contacted to schedule appt with ophthalmology. Call office if any signs of infection (redness, purulent drainage, swelling, pain, change in vision)

## 2018-07-12 ENCOUNTER — Telehealth: Payer: Self-pay | Admitting: Nurse Practitioner

## 2018-07-12 NOTE — Telephone Encounter (Signed)
Copied from [place] [phone] [phone]. Topic: Referral - Medical Records >> Jul 12, 2018  3:24 PM Robina Ade, Helene Kelp D wrote: CRM for notification. See Telephone encounter for: 07/12/18. Levada Dy with [facility] called and would like referral notes refaxed to them because they only got 2 pages. Their fax number is [phone].

## 2018-07-13 NOTE — Telephone Encounter (Signed)
See message below °

## 2018-10-13 ENCOUNTER — Other Ambulatory Visit: Payer: Self-pay

## 2018-10-13 ENCOUNTER — Ambulatory Visit (INDEPENDENT_AMBULATORY_CARE_PROVIDER_SITE_OTHER): Payer: Medicare Other | Admitting: Nurse Practitioner

## 2018-10-13 ENCOUNTER — Encounter: Payer: Self-pay | Admitting: Nurse Practitioner

## 2018-10-13 VITALS — Temp 97.7°F | Ht 64.0 in | Wt 219.0 lb

## 2018-10-13 DIAGNOSIS — K219 Gastro-esophageal reflux disease without esophagitis: Secondary | ICD-10-CM | POA: Diagnosis not present

## 2018-10-13 MED ORDER — OMEPRAZOLE 20 MG PO CPDR: 20.0000 mg | DELAYED_RELEASE_CAPSULE | Freq: Two times a day (BID) | ORAL | 0 refills | Status: DC

## 2018-10-13 NOTE — Progress Notes (Signed)
Virtual Visit via Video Note  I connected with Veronica Perez on 10/13/18 at  3:30 PM EDT by a video enabled telemedicine application and verified that I am speaking with the correct person using two identifiers.  Location: Patient: home Provider: office   I discussed the limitations of evaluation and management by telemedicine and the availability of in person appointments. The patient expressed understanding and agreed to proceed.  History of Present Illness: Abdominal Pain This is a recurrent problem. The current episode started more than 1 month ago. The onset quality is gradual. The problem occurs constantly. The problem has been waxing and waning. The pain is located in the epigastric region. The quality of the pain is colicky and a sensation of fullness. The abdominal pain does not radiate. Associated symptoms include anorexia and belching. Pertinent negatives include no constipation, diarrhea, dysuria, fever, melena, myalgias, nausea or vomiting. Nothing aggravates the pain. The pain is relieved by nothing. She has tried nothing for the symptoms. Her past medical history is significant for GERD. There is no history of abdominal surgery, colon cancer, Crohn's disease, gallstones, irritable bowel syndrome, pancreatitis, PUD or ulcerative colitis.   Observations/Objective: Physical Exam  Constitutional: She is oriented to person, place, and time.  Pulmonary/Chest: Effort normal.  Abdominal:  Localizes pain in epigastric region  Neurological: She is alert and oriented to person, place, and time.   Assessment and Plan: Veronica Perez was seen today for abdominal pain.  Diagnoses and all orders for this visit:  Gastroesophageal reflux disease without esophagitis -     omeprazole (PRILOSEC) 20 MG capsule; Take 1 capsule (20 mg total) by mouth 2 (two) times daily before a meal.   Follow Up Instructions: See avs   I discussed the assessment and treatment plan with the patient. The  patient was provided an opportunity to ask questions and all were answered. The patient agreed with the plan and demonstrated an understanding of the instructions.   The patient was advised to call back or seek an in-person evaluation if the symptoms worsen or if the condition fails to improve as anticipated.   Wilfred Lacy, NP

## 2018-10-13 NOTE — Patient Instructions (Addendum)
Start omeprazole as prescribed  Food Choices for Gastroesophageal Reflux Disease, Adult When you have gastroesophageal reflux disease (GERD), the foods you eat and your eating habits are very important. Choosing the right foods can help ease your discomfort. Think about working with a nutrition specialist (dietitian) to help you make good choices. What are tips for following this plan?  Meals  Choose healthy foods that are low in fat, such as fruits, vegetables, whole grains, low-fat dairy products, and lean meat, fish, and poultry.  Eat small meals often instead of 3 large meals a day. Eat your meals slowly, and in a place where you are relaxed. Avoid bending over or lying down until 2-3 hours after eating.  Avoid eating meals 2-3 hours before bed.  Avoid drinking a lot of liquid with meals.  Cook foods using methods other than frying. Bake, grill, or broil food instead.  Avoid or limit: ? Chocolate. ? Peppermint or spearmint. ? Alcohol. ? Pepper. ? Black and decaffeinated coffee. ? Black and decaffeinated tea. ? Bubbly (carbonated) soft drinks. ? Caffeinated energy drinks and soft drinks.  Limit high-fat foods such as: ? Fatty meat or fried foods. ? Whole milk, cream, butter, or ice cream. ? Nuts and nut butters. ? Pastries, donuts, and sweets made with butter or shortening.  Avoid foods that cause symptoms. These foods may be different for everyone. Common foods that cause symptoms include: ? Tomatoes. ? Oranges, lemons, and limes. ? Peppers. ? Spicy food. ? Onions and garlic. ? Vinegar. Lifestyle  Maintain a healthy weight. Ask your doctor what weight is healthy for you. If you need to lose weight, work with your doctor to do so safely.  Exercise for at least 30 minutes for 5 or more days each week, or as told by your doctor.  Wear loose-fitting clothes.  Do not smoke. If you need help quitting, ask your doctor.  Sleep with the head of your bed higher than your  feet. Use a wedge under the mattress or blocks under the bed frame to raise the head of the bed. Summary  When you have gastroesophageal reflux disease (GERD), food and lifestyle choices are very important in easing your symptoms.  Eat small meals often instead of 3 large meals a day. Eat your meals slowly, and in a place where you are relaxed.  Limit high-fat foods such as fatty meat or fried foods.  Avoid bending over or lying down until 2-3 hours after eating.  Avoid peppermint and spearmint, caffeine, alcohol, and chocolate. This information is not intended to replace advice given to you by your health care provider. Make sure you discuss any questions you have with your health care provider. Document Released: 07/22/2011 Document Revised: 05/13/2018 Document Reviewed: 02/26/2016 Elsevier Patient Education  2020 [employer].

## 2018-10-18 ENCOUNTER — Other Ambulatory Visit (HOSPITAL_COMMUNITY)
Admission: RE | Admit: 2018-10-18 | Discharge: 2018-10-18 | Disposition: A | Discharge status: Home / Self Care | Payer: Medicare Other | Source: Ambulatory Visit | Attending: Nurse Practitioner | Admitting: Nurse Practitioner

## 2018-10-18 ENCOUNTER — Ambulatory Visit (INDEPENDENT_AMBULATORY_CARE_PROVIDER_SITE_OTHER): Payer: Medicare Other | Admitting: Nurse Practitioner

## 2018-10-18 ENCOUNTER — Other Ambulatory Visit: Payer: Self-pay

## 2018-10-18 ENCOUNTER — Encounter: Payer: Self-pay | Admitting: Nurse Practitioner

## 2018-10-18 VITALS — BP 134/84 | HR 85 | Temp 99.3°F | Ht 64.0 in | Wt 222.2 lb

## 2018-10-18 DIAGNOSIS — E0590 Thyrotoxicosis, unspecified without thyrotoxic crisis or storm: Secondary | ICD-10-CM | POA: Diagnosis not present

## 2018-10-18 DIAGNOSIS — F312 Bipolar disorder, current episode manic severe with psychotic features: Secondary | ICD-10-CM | POA: Diagnosis not present

## 2018-10-18 DIAGNOSIS — R1084 Generalized abdominal pain: Secondary | ICD-10-CM

## 2018-10-18 DIAGNOSIS — Z113 Encounter for screening for infections with a predominantly sexual mode of transmission: Secondary | ICD-10-CM | POA: Diagnosis present

## 2018-10-18 DIAGNOSIS — K219 Gastro-esophageal reflux disease without esophagitis: Secondary | ICD-10-CM

## 2018-10-18 DIAGNOSIS — R635 Abnormal weight gain: Secondary | ICD-10-CM

## 2018-10-18 MED ORDER — OMEPRAZOLE 20 MG PO CPDR: 20.0000 mg | DELAYED_RELEASE_CAPSULE | Freq: Two times a day (BID) | ORAL | 1 refills | Status: DC

## 2018-10-18 NOTE — Patient Instructions (Addendum)
Go to lab for blood draw and urine collection  Start Omeprazole BID  I strongly recommend you contact endocrinology for an appointment ASAP. I also recommend an appt with psychiatry. Let me know if you change your mind about referral. Call 911 if you develop any suicidal or homicidal ideation.   Mania Mania is a condition that affects people who have certain mood disorders. Mania involves episodes of emotional highs that include having very high energy, racing thoughts, very high self-esteem, and decreased ability to concentrate. These episodes are very intense and can last longer than a week. In some cases, episodes of mania can be so strong that people with this condition need to be hospitalized for their safety and the safety of people around them. What are the causes? The cause of this condition is not known. What increases the risk? You are more likely to develop mania if you have a mood disorder, especially bipolar disorder. If you have a mood disorder, the following factors may increase your risk of developing mania:  Not getting enough sleep.  Using substances such as tobacco, caffeine, or illegal drugs.  Certain prescription medicines, such as antidepressants or antibiotics.  Stress or emotional events.  Certain seasons. Mania is more common in spring and summer.  The period of time after having a baby (postpartum period). What are the signs or symptoms? Symptoms of this condition include:  Periods of having very high energy that may last longer than a week. In some cases, you have so much energy that you may become unsafe and need to go to the hospital.  Very high self-esteem or self-confidence.  Decreased need for sleep.  Being unusually talkative, or feeling a need to keep talking. Speech may be very fast. It may seem like you cannot stop talking.  Racing thoughts or constant talking, with quick shifts between topics that may or may not be related (flight of  ideas).  Decreased ability to focus or concentrate.  Increased purposeful activity, such as work, study, or social activity.  Increased nonproductive activity. This could be pacing, squirming and fidgeting, or finger and toe tapping.  Impulsive behavior and poor judgment. This may result in high-risk activities, such as having unprotected sex or spending a lot of money.  Having false beliefs (delusions) or seeing, hearing, or feeling things that do not exist (hallucinations). How is this diagnosed? This condition may be diagnosed based on:  Your symptoms and medical history.  A physical exam. Your health care provider will check for physical conditions that may be causing your symptoms.  A mental health evaluation. You may be referred to a mental health provider who specializes in diagnosing and treating mood disorders. How is this treated? This condition may be treated with:  Medicines, such as mood stabilizers.  Talk therapy (psychotherapy) with a mental health provider.  A procedure to change the brain chemicals that send messages between brain cells (neurotransmitters). This procedure, called electroconvulsive therapy (ECT), applies short electrical pulses to the brain through the scalp. This may be used in cases of severe mania when other treatments have not helped. Follow these instructions at home:  Take over-the-counter and prescription medicines only as told by your health care provider.  Try to go to sleep and wake up at the same time every day.  Make and follow a routine for daily meal times.  Ask for support from family, friends, or relatives to make sure you stay on track with your treatment.  Keep all follow-up visits  as told by your health care provider. This is important. Contact a health care provider if:  You have concerns about your treatment.  You have side effects from your prescription medicines.  Your symptoms do not improve or they get  worse.  Your mania may be putting your health, or others' health, at risk. Get help right away if:  You think about hurting yourself or you try to hurt yourself.  You think about suicide. If you ever feel like you may hurt yourself or others, or have thoughts about taking your own life, get help right away. You can go to your nearest emergency department or call:  Your local emergency services (911 in the [place]  A suicide crisis helpline, such as the [facility] at (714)521-5442. This is open 24 hours a day. Summary  Mania involves episodes of emotional highs that include having very high energy, racing thoughts, very high self-esteem, and decreased ability to concentrate.  Episodes of mania are very intense and can last longer than a week.  Treatment for mania may include medicines and talk therapy (psychotherapy). This information is not intended to replace advice given to you by your health care provider. Make sure you discuss any questions you have with your health care provider. Document Released: 05/16/2016 Document Revised: 05/14/2018 Document Reviewed: 05/16/2016 Elsevier Patient Education  [place].

## 2018-10-18 NOTE — Progress Notes (Signed)
Subjective:  Patient ID: Veronica Perez, female    DOB: April 13, 1975  Age: 43 y.o. MRN: [id number]  CC: Follow-up (pt is here to follow up on stomach pain and weight gain/)  Abdominal Pain This is a recurrent problem. The current episode started more than 1 month ago. The onset quality is gradual. The problem occurs intermittently. The problem has been waxing and waning. The pain is located in the generalized abdominal region. The quality of the pain is dull, a sensation of fullness and cramping. The abdominal pain does not radiate. Associated symptoms include anorexia. Pertinent negatives include no belching, constipation, diarrhea, dysuria, flatus, frequency, headaches, hematochezia, hematuria, melena, myalgias, nausea, vomiting or weight loss. Exacerbated by: increased anxiety. The pain is relieved by nothing. She has tried nothing for the symptoms.  she thinks GI symptoms are due to increased anxiety. She thinks increased anxiety is due to noisey home environment. She states she has difficulty sleeping due to constant banging noise from her neighbor"  She declined to make appt with endocrinology or psychiatry. She states "I can control my thyroid with my diet" She denies any suicidal or homicidal ideation, denies any drug or ETOH use. She is sexually active with no use of condoms or hormonal contraception. LMP 09/17/2018. She denies any urinary or vaginal symptoms.  Reviewed past Medical, Social and Family history today.  Outpatient Medications Prior to Visit  Medication Sig Dispense Refill  . LORazepam (ATIVAN) 1 MG tablet Take 1 mg by mouth 3 (three) times daily.  1  . valACYclovir (VALTREX) 1000 MG tablet Take 1 tablet (1,000 mg total) by mouth 3 (three) times daily. 21 tablet 0  . omeprazole (PRILOSEC) 20 MG capsule Take 1 capsule (20 mg total) by mouth 2 (two) times daily before a meal. (Patient not taking: Reported on 10/18/2018) 28 capsule 0   No facility-administered medications  prior to visit.     ROS See HPI  Objective:  BP 134/84   Pulse 85   Temp 99.3 F (37.4 C) (Tympanic)   Ht 5\' 4"  (1.626 m)   Wt 222 lb 3.2 oz (100.8 kg)   LMP 09/17/2018   SpO2 98%   BMI 38.14 kg/m   BP Readings from Last 3 Encounters:  10/18/18 134/84  06/18/18 (!) 97/58  06/14/18 129/75   Wt Readings from Last 3 Encounters:  10/18/18 222 lb 3.2 oz (100.8 kg)  10/13/18 219 lb (99.3 kg)  06/18/18 203 lb 12.8 oz (92.4 kg)   Physical Exam Abdominal:     General: Bowel sounds are normal. There is no distension.     Palpations: Abdomen is soft. There is no mass.     Tenderness: There is generalized abdominal tenderness. There is no right CVA tenderness, left CVA tenderness or guarding.  Psychiatric:        Mood and Affect: Mood is elated. Affect is labile.        Speech: Speech is rapid and pressured.        Behavior: Behavior is hyperactive. Behavior is cooperative.        Thought Content: Thought content is delusional. Thought content does not include homicidal or suicidal ideation. Thought content does not include homicidal or suicidal plan.        Judgment: Judgment is impulsive.     Comments: Requires redirection    Lab Results  Component Value Date   WBC 10.7 (H) 09/29/2016   HGB 12.9 09/29/2016   HCT 37.9 09/29/2016   PLT 275  09/29/2016   GLUCOSE 122 (H) 10/18/2018   CHOL 105 12/11/2016   TRIG 120.0 12/11/2016   HDL 25.70 (L) 12/11/2016   LDLCALC 56 12/11/2016   ALT 24 09/13/2016   AST 20 09/13/2016   NA 141 10/18/2018   K 4.3 10/18/2018   CL 108 10/18/2018   CREATININE 0.61 10/18/2018   BUN 11 10/18/2018   CO2 24 10/18/2018   TSH <0.01 (L) 06/14/2018   HGBA1C 5.0 10/18/2018    Assessment & Plan:   Veronica Perez was seen today for follow-up.  Diagnoses and all orders for this visit:  Generalized abdominal pain -     Urinalysis w microscopic + reflex cultur -     B-HCG Quant -     REFLEXIVE URINE CULTURE -     Urine Culture  Bipolar disorder,  current episode manic severe with psychotic features ([place])  Hyperthyroidism  Screen for STD (sexually transmitted disease) -     Urine cytology ancillary only([place])  Unintended weight gain -     B-HCG Quant -     Basic metabolic panel -     Hemoglobin A1c  Gastroesophageal reflux disease without esophagitis -     omeprazole (PRILOSEC) 20 MG capsule; Take 1 capsule (20 mg total) by mouth 2 (two) times daily before a meal.   I am having Veronica Perez maintain her LORazepam, valACYclovir, and omeprazole.  Meds ordered this encounter  Medications  . omeprazole (PRILOSEC) 20 MG capsule    Sig: Take 1 capsule (20 mg total) by mouth 2 (two) times daily before a meal.    Dispense:  60 capsule    Refill:  1    Order Specific Question:   Supervising Provider    Answer:   Lucille Passy [[id number]]    Problem List Items Addressed This Visit      Digestive   GASTROESOPHAGEAL REFLUX, NO ESOPHAGITIS - Primary   Relevant Medications   omeprazole (PRILOSEC) 20 MG capsule     Endocrine   Hyperthyroidism     Other   Bipolar disorder, current episode manic severe with psychotic features ([place])    I tried to explain the importance of medication use to decrease anxiety, but she states she will not take any medication to make her crazy. She states she doe not think she is crazy. She thinks her parents are the crazy one and they want to put her away.       Other Visit Diagnoses    Screen for STD (sexually transmitted disease)       Relevant Orders   Urine cytology ancillary only([place])   Unintended weight gain       Relevant Orders   B-HCG Quant (Completed)   Basic metabolic panel (Completed)   Hemoglobin A1c (Completed)      Follow-up: Return in about 4 weeks (around 11/15/2018) for CPE (fasting).  Wilfred Lacy, NP

## 2018-10-19 ENCOUNTER — Encounter: Payer: Self-pay | Admitting: Nurse Practitioner

## 2018-10-19 NOTE — Assessment & Plan Note (Signed)
I tried to explain the importance of medication use to decrease anxiety, but she states she will not take any medication to make her crazy. She states she doe not think she is crazy. She thinks her parents are the crazy one and they want to put her away.

## 2018-10-20 LAB — URINALYSIS W MICROSCOPIC + REFLEX CULTURE
Bilirubin Urine: NEGATIVE
Glucose, UA: NEGATIVE
Hgb urine dipstick: NEGATIVE
Hyaline Cast: NONE SEEN /LPF
Ketones, ur: NEGATIVE
Nitrites, Initial: NEGATIVE
Protein, ur: NEGATIVE
RBC / HPF: NONE SEEN /HPF (ref 0–2)
Specific Gravity, Urine: 1.022 (ref 1.001–1.03)
pH: <=5 (ref 5.0–8.0)

## 2018-10-20 LAB — HEMOGLOBIN A1C
Hgb A1c MFr Bld: 5 % of total Hgb (ref <5.7)
Mean Plasma Glucose: 97 (calc)
eAG (mmol/L): 5.4 (calc)

## 2018-10-20 LAB — URINE CULTURE
MICRO NUMBER:: 882511
SPECIMEN QUALITY:: ADEQUATE

## 2018-10-20 LAB — BASIC METABOLIC PANEL
BUN: 11 mg/dL (ref 7–25)
CO2: 24 mmol/L (ref 20–32)
Calcium: 9.2 mg/dL (ref 8.6–10.2)
Chloride: 108 mmol/L (ref 98–110)
Creat: 0.61 mg/dL (ref 0.50–1.10)
Glucose, Bld: 122 mg/dL — ABNORMAL HIGH (ref 65–99)
Potassium: 4.3 mmol/L (ref 3.5–5.3)
Sodium: 141 mmol/L (ref 135–146)

## 2018-10-20 LAB — URINE CYTOLOGY ANCILLARY ONLY
Chlamydia: NEGATIVE
Neisseria Gonorrhea: NEGATIVE
Trichomonas: NEGATIVE

## 2018-10-20 LAB — CULTURE INDICATED

## 2018-10-20 LAB — HCG, QUANTITATIVE, PREGNANCY: HCG, Total, QN: <3 m[IU]/mL

## 2018-10-25 ENCOUNTER — Other Ambulatory Visit: Payer: Self-pay | Admitting: Nurse Practitioner

## 2018-10-25 DIAGNOSIS — K219 Gastro-esophageal reflux disease without esophagitis: Secondary | ICD-10-CM

## 2018-11-10 ENCOUNTER — Ambulatory Visit (HOSPITAL_COMMUNITY)
Admission: EM | Admit: 2018-11-10 | Discharge: 2018-11-10 | Disposition: A | Discharge status: Home / Self Care | Payer: Medicare Other

## 2018-11-10 ENCOUNTER — Other Ambulatory Visit: Payer: Self-pay

## 2018-11-10 ENCOUNTER — Encounter (HOSPITAL_COMMUNITY): Payer: Self-pay

## 2018-11-10 DIAGNOSIS — N939 Abnormal uterine and vaginal bleeding, unspecified: Secondary | ICD-10-CM | POA: Diagnosis not present

## 2018-11-10 NOTE — Discharge Instructions (Addendum)
Call the OB/GYN listed below to schedule the soonest available appointment.    Go to the emergency department if you have acute worsening symptoms or develop dizziness, weakness, heart palpitations, chest pain, shortness of breath, or other concerning symptoms.

## 2018-11-10 NOTE — ED Provider Notes (Signed)
[facility]    CSN: [id number] Arrival date & time: 11/10/18  1850      History   Chief Complaint Chief Complaint  Patient presents with  . Vaginal Bleeding    HPI Veronica Perez is a 43 y.o. female.   Patient presents with intermittent abnormal vaginal bleeding x3 weeks.  She states she is having bleeding and clots which occur after intercourse.  She denies dizziness, palpitations, fever, chills, abdominal pain, dysuria, back pain, pelvic pain, or other symptoms.  The history is provided by the patient.    Past Medical History:  Diagnosis Date  . Bipolar disorder ([place])   . Depression   . Panic attacks   . Thyroid disease     Patient Active Problem List   Diagnosis Date Noted  . Hyperthyroidism 11/24/2016  . Bipolar disorder, current episode manic severe with psychotic features ([place]) 09/16/2016  . PTSD (post-traumatic stress disorder) 09/16/2016  . Panic disorder 09/16/2016  . Low TSH level 05/30/2016  . Heart palpitations 05/30/2016  . Bipolar I disorder, most recent episode (or current) manic ([place]) 12/22/2012  . 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 PANIC ATTACKS 04/02/2006  . TOBACCO DEPENDENCE 04/02/2006  . RHINITIS, ALLERGIC 04/02/2006  . GASTROESOPHAGEAL REFLUX, NO ESOPHAGITIS 04/02/2006    Past Surgical History:  Procedure Laterality Date  . ADENOIDECTOMY    . OVARIAN CYST SURGERY    . TONSILLECTOMY      OB History   No obstetric history on file.      Home Medications    Prior to Admission medications   Medication Sig Start Date End Date Taking? Authorizing Provider  LORazepam (ATIVAN) 1 MG tablet Take 1 mg by mouth 3 (three) times daily. 11/11/16   [provider]  omeprazole (PRILOSEC) 20 MG capsule Take 1 capsule (20 mg total) by mouth 2 (two) times daily before a meal. 10/18/18   Nche, Charlene Brooke, NP  valACYclovir (VALTREX) 1000 MG tablet Take 1 tablet (1,000 mg total) by mouth 3  (three) times daily. 03/01/18   Nche, Charlene Brooke, NP    Family History Family History  Problem Relation Age of Onset  . Diabetes Father   . Heart disease Father   . Cancer Maternal Grandmother        breast cancer  . Bipolar disorder Mother   . Thyroid disease Neg Hx     Social History Social History   Tobacco Use  . Smoking status: Former Smoker    Quit date: 01/18/2012    Years since quitting: 6.8  . Smokeless tobacco: Never Used  Substance Use Topics  . Alcohol use: No  . Drug use: No     Allergies   Penicillins   Review of Systems Review of Systems  Constitutional: Negative for chills and fever.  HENT: Negative for ear pain and sore throat.   Eyes: Negative for pain and visual disturbance.  Respiratory: Negative for cough and shortness of breath.   Cardiovascular: Negative for chest pain and palpitations.  Gastrointestinal: Negative for abdominal pain and vomiting.  Genitourinary: Positive for vaginal bleeding. Negative for dysuria, flank pain, hematuria, pelvic pain and vaginal discharge.  Musculoskeletal: Negative for arthralgias and back pain.  Skin: Negative for color change and rash.  Neurological: Negative for seizures and syncope.  All other systems reviewed and are negative.    Physical Exam Triage Vital Signs ED Triage Vitals  Enc Vitals Group     BP 11/10/18 1904 Marland Kitchen)  147/80     Pulse Rate 11/10/18 1904 87     Resp 11/10/18 1904 18     Temp 11/10/18 1904 98.2 F (36.8 C)     Temp Source 11/10/18 1904 Temporal     SpO2 11/10/18 1904 100 %     Weight --      Height --      Head Circumference --      Peak Flow --      Pain Score 11/10/18 1903 1     Pain Loc --      Pain Edu? --      Excl. in [place]? --    No data found.  Updated Vital Signs BP (!) 147/80 (BP Location: Right Arm)   Pulse 87   Temp 98.2 F (36.8 C) (Temporal)   Resp 18   SpO2 100%   Visual Acuity Right Eye Distance:   Left Eye Distance:   Bilateral Distance:     Right Eye Near:   Left Eye Near:    Bilateral Near:     Physical Exam Vitals signs and nursing note reviewed.  Constitutional:      General: She is not in acute distress.    Appearance: She is well-developed. She is not ill-appearing.  HENT:     Head: Normocephalic and atraumatic.     Mouth/Throat:     Mouth: Mucous membranes are moist.     Pharynx: Oropharynx is clear.  Eyes:     Conjunctiva/sclera: Conjunctivae normal.  Neck:     Musculoskeletal: Neck supple.  Cardiovascular:     Rate and Rhythm: Normal rate and regular rhythm.     Heart sounds: No murmur.  Pulmonary:     Effort: Pulmonary effort is normal. No respiratory distress.     Breath sounds: Normal breath sounds.  Abdominal:     General: Bowel sounds are normal.     Palpations: Abdomen is soft.     Tenderness: There is no abdominal tenderness. There is no guarding or rebound.  Skin:    General: Skin is warm and dry.     Coloration: Skin is not pale.  Neurological:     General: No focal deficit present.     Mental Status: She is alert and oriented to person, place, and time.      UC Treatments / Results  Labs (all labs ordered are listed, but only abnormal results are displayed) Labs Reviewed - No data to display  EKG   Radiology No results found.  Procedures Procedures (including critical care time)  Medications Ordered in UC Medications - No data to display  Initial Impression / Assessment and Plan / UC Course  I have reviewed the triage vital signs and the nursing notes.  Pertinent labs & imaging results that were available during my care of the patient were reviewed by me and considered in my medical decision making (see chart for details).    Abnormal uterine bleeding.  Patient refused medication today.  Instructed her to call her OB/GYN or the one suggested to schedule an appointment.  Instructed her to go to the emergency department if she has worsening symptoms or develops dizziness,  weakness, palpitations, chest pain, shortness of breath, or other concerns.  Patient agrees to plan of care.     Final Clinical Impressions(s) / UC Diagnoses   Final diagnoses:  Abnormal uterine bleeding     Discharge Instructions     Call the OB/GYN listed below to schedule the soonest available  appointment.    Go to the emergency department if you have acute worsening symptoms or develop dizziness, weakness, heart palpitations, chest pain, shortness of breath, or other concerning symptoms.        ED Prescriptions    None     PDMP not reviewed this encounter.   Sharion Balloon, NP 11/10/18 2000

## 2018-11-10 NOTE — ED Triage Notes (Signed)
Patient presents to Urgent Care with complaints of continuous vaginal bleeding since 3 weeks ago. Patient reports she bleeds more after intercourse, is concerned she may be starting menopause.

## 2018-12-09 ENCOUNTER — Other Ambulatory Visit (HOSPITAL_COMMUNITY)
Admission: RE | Admit: 2018-12-09 | Discharge: 2018-12-09 | Disposition: A | Discharge status: Home / Self Care | Payer: Medicare Other | Source: Ambulatory Visit

## 2018-12-09 ENCOUNTER — Ambulatory Visit (INDEPENDENT_AMBULATORY_CARE_PROVIDER_SITE_OTHER): Payer: Medicare Other

## 2018-12-09 ENCOUNTER — Other Ambulatory Visit: Payer: Self-pay

## 2018-12-09 VITALS — BP 143/86 | HR 89 | Ht 64.0 in | Wt 213.0 lb

## 2018-12-09 DIAGNOSIS — Z01411 Encounter for gynecological examination (general) (routine) with abnormal findings: Secondary | ICD-10-CM | POA: Diagnosis not present

## 2018-12-09 DIAGNOSIS — N939 Abnormal uterine and vaginal bleeding, unspecified: Secondary | ICD-10-CM | POA: Insufficient documentation

## 2018-12-09 DIAGNOSIS — N930 Postcoital and contact bleeding: Secondary | ICD-10-CM | POA: Diagnosis present

## 2018-12-09 DIAGNOSIS — Z1231 Encounter for screening mammogram for malignant neoplasm of breast: Secondary | ICD-10-CM

## 2018-12-09 DIAGNOSIS — B373 Candidiasis of vulva and vagina: Secondary | ICD-10-CM

## 2018-12-09 DIAGNOSIS — Z01419 Encounter for gynecological examination (general) (routine) without abnormal findings: Secondary | ICD-10-CM | POA: Insufficient documentation

## 2018-12-09 DIAGNOSIS — Z124 Encounter for screening for malignant neoplasm of cervix: Secondary | ICD-10-CM | POA: Diagnosis present

## 2018-12-09 DIAGNOSIS — F3110 Bipolar disorder, current episode manic without psychotic features, unspecified: Secondary | ICD-10-CM

## 2018-12-09 DIAGNOSIS — B3731 Acute candidiasis of vulva and vagina: Secondary | ICD-10-CM

## 2018-12-09 DIAGNOSIS — Z1239 Encounter for other screening for malignant neoplasm of breast: Secondary | ICD-10-CM

## 2018-12-09 NOTE — Progress Notes (Signed)
CLINIC ENCOUNTER NOTE  History:  43 y.o. [id number] (daughter is 40yo) with history of surgical removal of ovarian cysts, HSV, hyperthyroidism and bipolar disorder here today for AUB since September 2020.   She says that before September, she had regular monthly menstrual periods with a moderate amount of bleeding and no discomfort, lasting ~4-5 days. Since September, her periods have occurred irregularly with more variable bleeding, usually twice a month, ~3-5 days duration, with frank red blood and some small clots. She also says she has had bleeding after each time she has sex with her boyfriend. Reports no pain with sex, also no abdominal, pelvic, flank or back pain generally. No vaginal discharge, itching, pruritus, or other vaginal symptoms. No dysuria or hematuria. She is sexually active with one female partner without condoms, her only partner in the last 10 years.  She says she has not had fever, chills, recent illness or sick contacts. She says she has been very fatigued, she thinks because of vaginal bleeding. She denies constipation or diarrhea but notes she has had a few black stools, which she associates with eating meat, but no bright red blood in stools. Reports no excessive energy. No hair, skin or nail changes she has appreciated. Says she sleeps during the day with noise canceling headphones on because her neighbors "are always banging on the walls, cars are pulling up outside with music blasting." Says she prefers to be awake at night when it is quiet and peaceful. She exercises ~3x/wk by doing "industrial dance." Says that she has had to move six times because of problems with noise, this has made her very stressed, and her health has deteriorated because of stress.  Not taking medication for hyperthyroid. Says she could not sleep while on methimizole previously. Prefers to manage her hyperthyroidism with "eating the right food and the right type of salt." Says she takes OTC iron  supplements. Does not want to take "unnatural" medications.  Past Medical History:  Diagnosis Date  . Bipolar disorder ([place])   . Depression   . HSV (herpes simplex virus) infection   . Panic attacks   . Thyroid disease     Past Surgical History:  Procedure Laterality Date  . ADENOIDECTOMY    . OVARIAN CYST SURGERY    . TONSILLECTOMY      The following portions of the patient's history were reviewed and updated as appropriate: allergies, current medications, past family history, past medical history, past social history, past surgical history and problem list.   Health Maintenance: Unsure of date of last pap and if she has had HPV testing. Never had a mammogram before.  Review of Systems:  Pertinent items are noted in HPI. Comprehensive review of systems was otherwise negative.   Objective:  Physical Exam BP (!) 143/86   Pulse 89   Ht 5\' 4"  (1.626 m)   Wt 213 lb (96.6 kg)   BMI 36.56 kg/m  CONSTITUTIONAL: Obese female in no acute distress.  HENT:  Normocephalic, atraumatic.  EYES: Conjunctivae and EOM are normal. No scleral icterus.  NECK: Normal range of motion, supple, no masses. No thyromegaly or masses.  SKIN: Skin is warm and dry. No rash noted. Not diaphoretic. No erythema. No pallor. [place]: Alert and oriented to person, place, and time.  PSYCHIATRIC: Normal mood and affect. Normal behavior. Normal judgment and thought content. CARDIOVASCULAR: RRR. Normal S1S2. No MRG. RESPIRATORY: Effort normal on room air. CTAB.  BREAST: breast exam without any skin lesions or abnormalities. Nipples  normal and everted bilaterally without discharge. Some fibrocystic changes to breasts bilaterally. No masses appreciated. No palpable lymph nodes bilaterally.  ABDOMEN: Soft, obese abdomen with panniculus overlying suprapubic area. Erythematous skin beneath this. Normal bowel sounds. Suprapubic and RLQ TTP present, as well as area of RLQ firmness.  PELVIC: Several ~51mm erythematous  papules and vesicles on inner thighs bilaterally. Normal appearing external genitalia; normal appearing vaginal mucosa and cervix without lesions or masses.  No abnormal discharge noted.  Uterus and adnexa difficult to palpate on bimanual exam given generous body habitus. Significant suprapubic and right adnexal tenderness present with deep palpation.   PSYCH: appears well-groomed, organized. Speech normal in rate, tone, fluency. Thoughts at times logical, linear, goal-directed; other times, possibly delusional or paranoid thinking or hallucinations related to noise in her home environment and her neighbors' behavior. Orientation, concentration, memory, fund of knowledge, grossly intact. Judgment and insight limited.   Labs and Imaging No results found.  See studies ordered in A/P  Assessment & Plan:   Problem List Items Addressed This Visit      Genitourinary   Abnormal uterine bleeding (AUB)    DDx includes cervical friability; malignancy such as CIN, cervical cancer, uterine or ovarian malignancy; STIs; cervical or uterine polyp, ovarian cyst, adenomyosis, leiomyoma, coagulopathy, and ovulatory dysfunction, potentially related to thyroid disorder.  Pelvic exam today without obvious cervical pathology. Patient has marked tenderness to palpation on bimanual exam, with tenderness in suprapubic and right adnexal areas. Due to patient habitus, difficult to localize adnexa bilaterally.  Given HPI, patient's history of prior ovarian cysts, and today's exam findings, especially concerned ovarian pathology.   Scheduled TVUS to evaluate further. Pap smear today. Will follow up in clinic as needed based on results of that study, or in 3 months. Also ordered CBC, HBV, HCV, HIV, UPreg, RPR.   Most recent thyroid studies showed low TSH, T4 wnl. Somewhat eassuring that HIV in May and gonorrhea, chlamydia, trichomonas tests in Sept '20 were negative.          Other   Bipolar I disorder, most recent  episode (or current) manic ([place])    Pt has complex psych history including bipolar 1, depression, and panic attacks.   She presented as well-groomed, organized, with normal speech. At times speech became more rapid, but was not pressured. No tangentiality, flight of ideas, or ideas of reference. Did not present as being manic or hypomanic today, but complained of constant fatigue.  Pt had logical, linear, and goal-directed thinking throughout most of interview. Evaluation of thought content eventually revealed what might be delusional or paranoid thinking or possible hallucinations related to perceptions of constant noise and neighbors banging on the walls of her home. She says she has experienced these same disturbances in six different residences and that she can never get relief from this noise, "which feels like a knife cutting into my stomach and my back." She wears noise canceling headphones to sleep during the day and stays awake at night to deal with this, but is not currently taking any medication to manage symptoms.   She also reported that she does not want to take any "unnatural" medications or substances that are not "from the earth," and that she has studied chemistry, pharmacology, and health extensively.  Given the patient's psych history and her exam today, suspect that her bipolar is not well-controlled and that disordered thinking,perceptual disturbances, mania and/or hypomania, and possible psychosis are making it difficult for her to manage her health overall,  and to take or adhere to recommended medication regimens.   Recommended follow-up with PCP to discuss comprehensive management of her medical and psychiatric health issues.      Breast cancer screening    Pt reports that mother and grandmother both had breast cysts. Says mother had mastectomy. Pt reports no skin changes, pain, nipple discharge, or masses in her breasts. Breast exam with fibrocystic changes but no other  abnormalities. She has never had a mammogram so ordered one today.       Other Visit Diagnoses    Encounter for well woman exam with routine gynecological exam    -  Primary   Relevant Orders   Cervicovaginal ancillary only( [place])   Hepatitis B surface antigen   Hepatitis C antibody   HIV antibody   RPR   POCT urine pregnancy   Cytology - PAP( [place])   Postcoital bleeding       Relevant Orders   Cervicovaginal ancillary only( [place])   Hepatitis B surface antigen   Hepatitis C antibody   HIV antibody   RPR   US PELVIC COMPLETE WITH TRANSVAGINAL   Cytology - PAP( [place])   CBC   Encounter for Papanicolaou smear for cervical cancer screening       Relevant Orders   Cytology - PAP( [place])   Encounter for screening breast examination          Routine preventative health maintenance measures emphasized.  Aron Baba, Medical Student

## 2018-12-09 NOTE — Assessment & Plan Note (Addendum)
DDx includes cervical friability; malignancy such as CIN, cervical cancer, uterine or ovarian malignancy; STIs; cervical or uterine polyp, ovarian cyst, adenomyosis, leiomyoma, coagulopathy, and ovulatory dysfunction, potentially related to thyroid disorder.  Pelvic exam today without obvious cervical pathology. Patient has marked tenderness to palpation on bimanual exam, with tenderness in suprapubic and right adnexal areas. Due to patient habitus, difficult to localize adnexa bilaterally.  Given HPI, patient's history of prior ovarian cysts, and today's exam findings, especially concerned ovarian pathology.   Scheduled TVUS to evaluate further. Pap smear today. Will follow up in clinic as needed based on results of that study, or in 3 months. Also ordered CBC, HBV, HCV, HIV, UPreg, RPR.   Most recent thyroid studies showed low TSH, T4 wnl. Somewhat eassuring that HIV in May and gonorrhea, chlamydia, trichomonas tests in Sept '20 were negative.

## 2018-12-09 NOTE — Assessment & Plan Note (Signed)
Pt reports that mother and grandmother both had breast cysts. Says mother had mastectomy. Pt reports no skin changes, pain, nipple discharge, or masses in her breasts. Breast exam with fibrocystic changes but no other abnormalities. She has never had a mammogram so ordered one today.

## 2018-12-09 NOTE — Progress Notes (Signed)
NGYN pt presents for annual. Pt requests all STD testing today. Pt c/o menorrhagia since Sept.  Bleeding is worst after IC.  Pt declines BC.

## 2018-12-09 NOTE — Assessment & Plan Note (Addendum)
Pt has complex psych history including bipolar 1, depression, and panic attacks.   She presented as well-groomed, organized, with normal speech. At times speech became more rapid, but was not pressured. No tangentiality, flight of ideas, or ideas of reference. Did not present as being manic or hypomanic today, but complained of constant fatigue.  Pt had logical, linear, and goal-directed thinking throughout most of interview. Evaluation of thought content eventually revealed what might be delusional or paranoid thinking or possible hallucinations related to perceptions of constant noise and neighbors banging on the walls of her home. She says she has experienced these same disturbances in six different residences and that she can never get relief from this noise, "which feels like a knife cutting into my stomach and my back." She wears noise canceling headphones to sleep during the day and stays awake at night to deal with this, but is not currently taking any medication to manage symptoms.   She also reported that she does not want to take any "unnatural" medications or substances that are not "from the earth," and that she has studied chemistry, pharmacology, and health extensively.  Given the patient's psych history and her exam today, suspect that her bipolar is not well-controlled and that disordered thinking,perceptual disturbances, mania and/or hypomania, and possible psychosis are making it difficult for her to manage her health overall, and to take or adhere to recommended medication regimens.   Recommended follow-up with PCP to discuss comprehensive management of her medical and psychiatric health issues.

## 2018-12-10 LAB — CERVICOVAGINAL ANCILLARY ONLY
Bacterial Vaginitis (gardnerella): NEGATIVE
Candida Glabrata: NEGATIVE
Candida Vaginitis: POSITIVE — AB
Chlamydia: NEGATIVE
Comment: NEGATIVE
Comment: NEGATIVE
Comment: NEGATIVE
Comment: NEGATIVE
Comment: NEGATIVE
Comment: NORMAL
Neisseria Gonorrhea: NEGATIVE
Trichomonas: NEGATIVE

## 2018-12-10 MED ORDER — FLUCONAZOLE 150 MG PO TABS: 150.0000 mg | ORAL_TABLET | Freq: Every day | ORAL | 0 refills | Status: DC

## 2018-12-10 NOTE — Addendum Note (Signed)
Addended by: Gavin Pound L on: 12/10/2018 05:47 PM   Modules accepted: Orders

## 2018-12-13 LAB — CYTOLOGY - PAP
Comment: NEGATIVE
Diagnosis: NEGATIVE
High risk HPV: NEGATIVE

## 2019-01-11 ENCOUNTER — Ambulatory Visit
Admission: RE | Admit: 2019-01-11 | Discharge: 2019-01-11 | Disposition: A | Discharge status: Home / Self Care | Payer: Medicare Other | Source: Ambulatory Visit

## 2019-01-11 DIAGNOSIS — N930 Postcoital and contact bleeding: Secondary | ICD-10-CM

## 2019-01-17 ENCOUNTER — Ambulatory Visit: Payer: Medicare Other | Admitting: Obstetrics and Gynecology

## 2019-02-02 ENCOUNTER — Other Ambulatory Visit: Payer: Self-pay

## 2019-02-02 ENCOUNTER — Ambulatory Visit
Admission: RE | Admit: 2019-02-02 | Discharge: 2019-02-02 | Disposition: A | Discharge status: Home / Self Care | Payer: Medicare Other | Source: Ambulatory Visit

## 2019-02-02 DIAGNOSIS — Z1231 Encounter for screening mammogram for malignant neoplasm of breast: Secondary | ICD-10-CM

## 2019-05-27 ENCOUNTER — Other Ambulatory Visit: Payer: Self-pay

## 2019-05-27 ENCOUNTER — Telehealth (INDEPENDENT_AMBULATORY_CARE_PROVIDER_SITE_OTHER): Payer: Medicare Other | Admitting: Nurse Practitioner

## 2019-05-27 ENCOUNTER — Encounter: Payer: Self-pay | Admitting: Nurse Practitioner

## 2019-05-27 ENCOUNTER — Telehealth: Payer: Medicare Other | Admitting: Nurse Practitioner

## 2019-05-27 VITALS — Ht 64.0 in | Wt 198.4 lb

## 2019-05-27 DIAGNOSIS — R197 Diarrhea, unspecified: Secondary | ICD-10-CM | POA: Diagnosis not present

## 2019-05-27 DIAGNOSIS — R1013 Epigastric pain: Secondary | ICD-10-CM

## 2019-05-27 MED ORDER — FAMOTIDINE 20 MG PO TABS: 20.0000 mg | ORAL_TABLET | Freq: Two times a day (BID) | ORAL | 0 refills | Status: DC

## 2019-05-27 MED ORDER — ONDANSETRON HCL 4 MG PO TABS: 4.0000 mg | ORAL_TABLET | Freq: Three times a day (TID) | ORAL | 0 refills | Status: DC | PRN

## 2019-05-27 NOTE — Progress Notes (Signed)
Virtual Visit via Video Note  I connected with@ on 05/27/19 at  2:00 PM EDT by a video enabled telemedicine application and verified that I am speaking with the correct person using two identifiers.  Location: Patient:Home Provider: Office Participants: patient and provider  I discussed the limitations of evaluation and management by telemedicine and the availability of in person appointments. I also discussed with the patient that there may be a patient responsible charge related to this service. The patient expressed understanding and agreed to proceed.  [id number] on for years depend on living conditions/stressful from relocated 05/12/2019/unable to keep anything down,nausea,vomit diarrhea  History of Present Illness: Abdominal Pain This is a recurrent problem. The current episode started more than 1 month ago. The problem occurs intermittently. The problem has been waxing and waning. The pain is located in the generalized abdominal region. The quality of the pain is colicky, a sensation of fullness and cramping. The abdominal pain radiates to the suprapubic region. Associated symptoms include anorexia, belching, diarrhea, nausea and vomiting. Pertinent negatives include no constipation, dysuria, fever, flatus, frequency, headaches, hematochezia, hematuria, melena, myalgias or weight loss. The pain is aggravated by eating. The pain is relieved by nothing. She has tried antacids for the symptoms. The treatment provided mild relief. Her past medical history is significant for GERD. There is no history of abdominal surgery, colon cancer, Crohn's disease, gallstones, pancreatitis, PUD or ulcerative colitis.  Neck Pain  Pertinent negatives include no fever, headaches or weight loss.  reports she is not sexually active Weight loss noted Wt Readings from Last 3 Encounters:  05/27/19 198 lb 6.4 oz (90 kg)  12/09/18 213 lb (96.6 kg)  10/18/18 222 lb 3.2 oz (100.8 kg)    Observations/Objective: Physical Exam  Constitutional: She is oriented to person, place, and time. No distress.  Pulmonary/Chest: Effort normal.  Abdominal: She exhibits no distension.  Neurological: She is alert and oriented to person, place, and time.   Assessment and Plan: Tayva was seen today for abdominal pain and neck pain.  Diagnoses and all orders for this visit:  Dyspepsia -     Basic metabolic panel; Future -     Lipase; Future -     Hepatic function panel; Future -     H. pylori breath test; Future -     ondansetron (ZOFRAN) 4 MG tablet; Take 1 tablet (4 mg total) by mouth every 8 (eight) hours as needed for nausea or vomiting. -     famotidine (PEPCID) 20 MG tablet; Take 1 tablet (20 mg total) by mouth 2 (two) times daily.  Diarrhea, unspecified type -     Gastrointestinal Pathogen Panel PCR; Future    Follow Up Instructions: She declined to schedule lab appt today or on Monday. Advised to Call office and schedule lab appt ASAP Schedule appt with endocrinology ASAP. Maintain BRAT diet and adequate oral hydration. Go to ED if symptoms worsen.  I discussed the assessment and treatment plan with the patient. The patient was provided an opportunity to ask questions and all were answered. The patient agreed with the plan and demonstrated an understanding of the instructions.   The patient was advised to call back or seek an in-person evaluation if the symptoms worsen or if the condition fails to improve as anticipated.   Wilfred Lacy, NP

## 2019-05-27 NOTE — Patient Instructions (Addendum)
Call office and schedule lab appt ASAP Schedule appt with endocrinology ASAP.  Maintain BRAT diet and adequate oral hydration. Go to ED if symptoms worsen.   Bland Diet A bland diet consists of foods that are often soft and do not have a lot of fat, fiber, or extra seasonings. Foods without fat, fiber, or seasoning are easier for the body to digest. They are also less likely to irritate your mouth, throat, stomach, and other parts of your digestive system. A bland diet is sometimes called a BRAT diet. What is my plan? Your health care provider or food and nutrition specialist (dietitian) may recommend specific changes to your diet to prevent symptoms or to treat your symptoms. These changes may include:  Eating small meals often.  Cooking food until it is soft enough to chew easily.  Chewing your food well.  Drinking fluids slowly.  Not eating foods that are very spicy, sour, or fatty.  Not eating citrus fruits, such as oranges and grapefruit. What do I need to know about this diet?  Eat a variety of foods from the bland diet food list.  Do not follow a bland diet longer than needed.  Ask your health care provider whether you should take vitamins or supplements. What foods can I eat? Grains  Hot cereals, such as cream of wheat. Rice. Bread, crackers, or tortillas made from refined white flour. Vegetables Canned or cooked vegetables. Mashed or boiled potatoes. Fruits  Bananas. Applesauce. Other types of cooked or canned fruit with the skin and seeds removed, such as canned peaches or pears. Meats and other proteins  Scrambled eggs. Creamy peanut butter or other nut butters. Lean, well-cooked meats, such as chicken or fish. Tofu. Soups or broths. Dairy Low-fat dairy products, such as milk, cottage cheese, or yogurt. Beverages  Water. Herbal tea. Apple juice. Fats and oils Mild salad dressings. Canola or olive oil. Sweets and desserts Pudding. Custard. Fruit gelatin.  Ice cream. The items listed above may not be a complete list of recommended foods and beverages. Contact a dietitian for more options. What foods are not recommended? Grains Whole grain breads and cereals. Vegetables Raw vegetables. Fruits Raw fruits, especially citrus, berries, or dried fruits. Dairy Whole fat dairy foods. Beverages Caffeinated drinks. Alcohol. Seasonings and condiments Strongly flavored seasonings or condiments. Hot sauce. Salsa. Other foods Spicy foods. Fried foods. Sour foods, such as pickled or fermented foods. Foods with high sugar content. Foods high in fiber. The items listed above may not be a complete list of foods and beverages to avoid. Contact a dietitian for more information. Summary  A bland diet consists of foods that are often soft and do not have a lot of fat, fiber, or extra seasonings.  Foods without fat, fiber, or seasoning are easier for the body to digest.  Check with your health care provider to see how long you should follow this diet plan. It is not meant to be followed for long periods. This information is not intended to replace advice given to you by your health care provider. Make sure you discuss any questions you have with your health care provider. Document Revised: 02/18/2017 Document Reviewed: 02/18/2017 Elsevier Patient Education  2020 [employer].

## 2019-05-28 ENCOUNTER — Other Ambulatory Visit: Payer: Self-pay

## 2019-05-28 ENCOUNTER — Encounter (HOSPITAL_COMMUNITY): Payer: Self-pay | Admitting: Emergency Medicine

## 2019-05-28 ENCOUNTER — Emergency Department (HOSPITAL_COMMUNITY): Payer: Medicare Other

## 2019-05-28 ENCOUNTER — Encounter (HOSPITAL_COMMUNITY): Payer: Self-pay

## 2019-05-28 ENCOUNTER — Ambulatory Visit (INDEPENDENT_AMBULATORY_CARE_PROVIDER_SITE_OTHER)
Admission: EM | Admit: 2019-05-28 | Discharge: 2019-05-28 | Disposition: A | Discharge status: Home / Self Care | Payer: Medicare Other | Source: Home / Self Care | Attending: Family Medicine | Admitting: Family Medicine

## 2019-05-28 ENCOUNTER — Emergency Department (HOSPITAL_COMMUNITY)
Admission: EM | Admit: 2019-05-28 | Discharge: 2019-05-29 | Disposition: A | Discharge status: Home / Self Care | Payer: Medicare Other | Attending: Emergency Medicine | Admitting: Emergency Medicine

## 2019-05-28 DIAGNOSIS — R112 Nausea with vomiting, unspecified: Secondary | ICD-10-CM | POA: Insufficient documentation

## 2019-05-28 DIAGNOSIS — R197 Diarrhea, unspecified: Secondary | ICD-10-CM | POA: Diagnosis not present

## 2019-05-28 DIAGNOSIS — S0990XA Unspecified injury of head, initial encounter: Secondary | ICD-10-CM

## 2019-05-28 DIAGNOSIS — R519 Headache, unspecified: Secondary | ICD-10-CM | POA: Diagnosis not present

## 2019-05-28 DIAGNOSIS — Z5321 Procedure and treatment not carried out due to patient leaving prior to being seen by health care provider: Secondary | ICD-10-CM | POA: Diagnosis not present

## 2019-05-28 LAB — COMPREHENSIVE METABOLIC PANEL
ALT: 22 U/L (ref 0–44)
AST: 16 U/L (ref 15–41)
Albumin: 4.2 g/dL (ref 3.5–5.0)
Alkaline Phosphatase: 64 U/L (ref 38–126)
Anion gap: 11 (ref 5–15)
BUN: 8 mg/dL (ref 6–20)
CO2: 19 mmol/L — ABNORMAL LOW (ref 22–32)
Calcium: 8.8 mg/dL — ABNORMAL LOW (ref 8.9–10.3)
Chloride: 106 mmol/L (ref 98–111)
Creatinine, Ser: 0.58 mg/dL (ref 0.44–1.00)
GFR calc Af Amer: >60 mL/min (ref >60)
GFR calc non Af Amer: >60 mL/min (ref >60)
Glucose, Bld: 105 mg/dL — ABNORMAL HIGH (ref 70–99)
Potassium: 4 mmol/L (ref 3.5–5.1)
Sodium: 136 mmol/L (ref 135–145)
Total Bilirubin: 0.3 mg/dL (ref 0.3–1.2)
Total Protein: 7.1 g/dL (ref 6.5–8.1)

## 2019-05-28 LAB — CBC
HCT: 44 % (ref 36.0–46.0)
Hemoglobin: 14.4 g/dL (ref 12.0–15.0)
MCH: 27.9 pg (ref 26.0–34.0)
MCHC: 32.7 g/dL (ref 30.0–36.0)
MCV: 85.3 fL (ref 80.0–100.0)
Platelets: 310 10*3/uL (ref 150–400)
RBC: 5.16 MIL/uL — ABNORMAL HIGH (ref 3.87–5.11)
RDW: 12.5 % (ref 11.5–15.5)
WBC: 9.6 10*3/uL (ref 4.0–10.5)
nRBC: 0 % (ref 0.0–0.2)

## 2019-05-28 LAB — I-STAT BETA HCG BLOOD, ED (MC, WL, AP ONLY): I-stat hCG, quantitative: <5 m[IU]/mL (ref <5)

## 2019-05-28 LAB — LIPASE, BLOOD: Lipase: 26 U/L (ref 11–51)

## 2019-05-28 MED ORDER — ONDANSETRON 4 MG PO TBDP
4.0000 mg | ORAL_TABLET | Freq: Once | ORAL | Status: AC | PRN
  Administered 2019-05-28: 4 mg via ORAL
  Filled 2019-05-28: qty 1

## 2019-05-28 MED ORDER — SODIUM CHLORIDE 0.9% FLUSH: 3.0000 mL | Freq: Once | INTRAVENOUS | Status: DC

## 2019-05-28 NOTE — ED Triage Notes (Signed)
Pt states she tripped and fell yesterday and hit the left side of her head. Pt c/o 10/10 pain in left side of head and neck. Pt denies LOC. Pt denies blood thinners. Pt states she has numbness and tingling on left side of head to back of neck on left side. Pt denies N/V, vision changes.

## 2019-05-28 NOTE — ED Triage Notes (Signed)
Pt reports she tripped over a box last night, fell hitting the top of her head, n/v/d since that time. Pt reports LOC. Pt heaving and tearful in triage.

## 2019-05-28 NOTE — ED Provider Notes (Signed)
   [facility]   [id number] 05/28/19 Arrival Time: [id number]  ASSESSMENT & PLAN:  1. Injury of head, initial encounter     Given reported persistent headache/pain sending to ED for further evaluation. Stable upon discharge.   Reviewed expectations re: course of current medical issues. Questions answered. Outlined signs and symptoms indicating need for more acute intervention. Patient verbalized understanding. After Visit Summary given.  SUBJECTIVE: History from: patient. Gives fairly concise history but appears to randomly gaze off a few times.  Veronica Perez is a 44 y.o. female who presents with complaint of a head injury yesterday. She reports tripping over box hitting left side of her head on wall or floor; can't remember specifically. Reports no loss of consciousness. Denies retrograde and anterograde amnesia. Ambulatory since injury. Reports gradual onset of persistent pain of left side of head and into neck that has limited normal activities. No extremity sensation changes or weakness but does report "tingling sensation or numbness" of left head/face. No associated n/v. No visual changes. Normal bowel and bladder habits. OTC treatment: has not tried OTCs for relief of pain.  Denies: balance or coordination problems, confusion, nausea, ringing in ears, slurred speech, visual changes and vomiting. History of head injury or concussion: no.   OBJECTIVE:  Vitals:   05/28/19 1617 05/28/19 1618  BP: 129/74   Pulse: 75   Resp: 16   Temp: 98.4 F (36.9 C)   TempSrc: Oral   SpO2: 99%   Weight:  89.8 kg  Height:  5\' 4"  (1.626 m)     GCS: 15 General appearance: alert; persistent crying in exam room secondary to reported head pain HEENT: normocephalic; no bruising or open head wounds noted; conjunctivae normal; oral mucosa normal Neck: supple with FROM; no midline cervical tenderness or deformity; no anterior mass or crepitus; trachea midline Lungs: speaks full  sentences without difficulty Heart: regular Abdomen: soft, obese Extremities: moves all extremities normally; no edema; symmetrical with no gross deformities Skin: warm and dry Neurologic: CN 2-12 grossly intact; without dysarthria or aphasia; normal gait Psychological: alert and cooperative; normal mood and affect    Labs Reviewed - No data to display  No results found.  Allergies  Allergen Reactions  . Penicillins Hives, Itching, Nausea And Vomiting and Other (See Comments)    Has patient had a PCN reaction causing immediate rash, facial/tongue/throat swelling, SOB or lightheadedness with hypotension: No Has patient had a PCN reaction causing severe rash involving mucus membranes or skin necrosis: No Has patient had a PCN reaction that required hospitalization: No Has patient had a PCN reaction occurring within the last 10 years: No If all of the above answers are "NO", then may proceed with Cephalosporin use.   Past Medical History:  Diagnosis Date  . Bipolar disorder ([place])   . Depression   . HSV (herpes simplex virus) infection   . Panic attacks   . Thyroid disease    Past Surgical History:  Procedure Laterality Date  . ADENOIDECTOMY    . OVARIAN CYST SURGERY    . TONSILLECTOMY     Family History  Problem Relation Age of Onset  . Diabetes Father   . Heart disease Father   . Cancer Maternal Grandmother        breast cancer  . Bipolar disorder Mother   . Thyroid disease Neg Hx        Vanessa Kick, MD 05/28/19 [phone]

## 2019-05-28 NOTE — ED Notes (Signed)
Pt's mother came to pick her up, stated she wasn't waiting any longer.

## 2019-06-09 ENCOUNTER — Other Ambulatory Visit: Payer: Self-pay | Admitting: Nurse Practitioner

## 2019-06-09 DIAGNOSIS — R1013 Epigastric pain: Secondary | ICD-10-CM

## 2019-06-26 ENCOUNTER — Encounter (HOSPITAL_COMMUNITY): Payer: Self-pay

## 2019-06-26 ENCOUNTER — Other Ambulatory Visit: Payer: Self-pay

## 2019-06-26 ENCOUNTER — Emergency Department (HOSPITAL_COMMUNITY)
Admission: EM | Admit: 2019-06-26 | Discharge: 2019-06-27 | Disposition: A | Discharge status: Other Acute Inpatient Hospital | Payer: Medicare Other | Source: Home / Self Care

## 2019-06-26 DIAGNOSIS — F312 Bipolar disorder, current episode manic severe with psychotic features: Secondary | ICD-10-CM | POA: Insufficient documentation

## 2019-06-26 DIAGNOSIS — R440 Auditory hallucinations: Secondary | ICD-10-CM | POA: Insufficient documentation

## 2019-06-26 DIAGNOSIS — F410 Panic disorder [episodic paroxysmal anxiety] without agoraphobia: Secondary | ICD-10-CM

## 2019-06-26 DIAGNOSIS — F22 Delusional disorders: Secondary | ICD-10-CM

## 2019-06-26 DIAGNOSIS — R441 Visual hallucinations: Secondary | ICD-10-CM | POA: Insufficient documentation

## 2019-06-26 DIAGNOSIS — Z20822 Contact with and (suspected) exposure to covid-19: Secondary | ICD-10-CM | POA: Insufficient documentation

## 2019-06-26 LAB — CBC
HCT: 42 % (ref 36.0–46.0)
Hemoglobin: 14 g/dL (ref 12.0–15.0)
MCH: 28 pg (ref 26.0–34.0)
MCHC: 33.3 g/dL (ref 30.0–36.0)
MCV: 84 fL (ref 80.0–100.0)
Platelets: 367 10*3/uL (ref 150–400)
RBC: 5 MIL/uL (ref 3.87–5.11)
RDW: 12.1 % (ref 11.5–15.5)
WBC: 11.9 10*3/uL — ABNORMAL HIGH (ref 4.0–10.5)
nRBC: 0 % (ref 0.0–0.2)

## 2019-06-26 LAB — COMPREHENSIVE METABOLIC PANEL
ALT: 20 U/L (ref 0–44)
AST: 17 U/L (ref 15–41)
Albumin: 4.2 g/dL (ref 3.5–5.0)
Alkaline Phosphatase: 53 U/L (ref 38–126)
Anion gap: 9 (ref 5–15)
BUN: 6 mg/dL (ref 6–20)
CO2: 23 mmol/L (ref 22–32)
Calcium: 9.1 mg/dL (ref 8.9–10.3)
Chloride: 109 mmol/L (ref 98–111)
Creatinine, Ser: 0.7 mg/dL (ref 0.44–1.00)
GFR calc Af Amer: >60 mL/min (ref >60)
GFR calc non Af Amer: >60 mL/min (ref >60)
Glucose, Bld: 110 mg/dL — ABNORMAL HIGH (ref 70–99)
Potassium: 3.5 mmol/L (ref 3.5–5.1)
Sodium: 141 mmol/L (ref 135–145)
Total Bilirubin: 0.7 mg/dL (ref 0.3–1.2)
Total Protein: 7.3 g/dL (ref 6.5–8.1)

## 2019-06-26 LAB — SARS CORONAVIRUS 2 BY RT PCR (HOSPITAL ORDER, PERFORMED IN ~~LOC~~ HOSPITAL LAB): SARS Coronavirus 2: NEGATIVE

## 2019-06-26 LAB — ETHANOL: Alcohol, Ethyl (B): <10 mg/dL (ref <10)

## 2019-06-26 LAB — SALICYLATE LEVEL: Salicylate Lvl: <7 mg/dL — ABNORMAL LOW (ref 7.0–30.0)

## 2019-06-26 LAB — RAPID URINE DRUG SCREEN, HOSP PERFORMED
Amphetamines: NOT DETECTED
Barbiturates: NOT DETECTED
Benzodiazepines: POSITIVE — AB
Cocaine: NOT DETECTED
Opiates: NOT DETECTED
Tetrahydrocannabinol: NOT DETECTED

## 2019-06-26 LAB — ACETAMINOPHEN LEVEL: Acetaminophen (Tylenol), Serum: <10 ug/mL — ABNORMAL LOW (ref 10–30)

## 2019-06-26 MED ORDER — LORAZEPAM 2 MG/ML IJ SOLN
1.0000 mg | Freq: Once | INTRAMUSCULAR | Status: AC
  Administered 2019-06-26: 1 mg via INTRAMUSCULAR

## 2019-06-26 MED ORDER — LORAZEPAM 1 MG PO TABS
1.0000 mg | ORAL_TABLET | Freq: Three times a day (TID) | ORAL | Status: DC
  Administered 2019-06-26 – 2019-06-27 (×4): 1 mg via ORAL
  Filled 2019-06-26 (×4): qty 1

## 2019-06-26 MED ORDER — IBUPROFEN 400 MG PO TABS
600.0000 mg | ORAL_TABLET | Freq: Three times a day (TID) | ORAL | Status: DC | PRN
  Administered 2019-06-26 – 2019-06-27 (×2): 600 mg via ORAL
  Filled 2019-06-26 (×2): qty 1

## 2019-06-26 MED ORDER — LORAZEPAM 2 MG/ML IJ SOLN
1.0000 mg | Freq: Once | INTRAMUSCULAR | Status: DC
  Filled 2019-06-26: qty 1

## 2019-06-26 MED ORDER — FLUOXETINE HCL 20 MG PO CAPS
20.0000 mg | ORAL_CAPSULE | Freq: Every day | ORAL | Status: DC
  Administered 2019-06-26 – 2019-06-27 (×2): 20 mg via ORAL
  Filled 2019-06-26 (×3): qty 1

## 2019-06-26 NOTE — ED Notes (Signed)
Reassess in AM - [place] ordered

## 2019-06-26 NOTE — ED Notes (Addendum)
Pt crying - no tears noted. Pt given ice pack for head/neck and Ativan given.

## 2019-06-26 NOTE — ED Provider Notes (Signed)
[facility] EMERGENCY DEPARTMENT Provider Note   CSN: [id number] Arrival date & time: 06/26/19  0211     History Chief Complaint  Patient presents with  . Panic Attack  . Psychiatric Evaluation    Veronica Perez is a 44 y.o. female.  HPI     This is a 44 year old female with a history of bipolar disorder, depression, panic attacks, thyroid disease who presents for psychiatric evaluation.  Patient reports that she is here because her family needs help.  She states that specifically her mom is anemic and is not eating.  She also reports that she has an elderly father that she is concerned about as well as her 24 year old daughter.  She is tearful and labile but cannot give me specific reasons why she is concerned about her family.  Patient was brought to the ED by her 83 year old daughter who was concerned about her paranoia.  Patient will not answer any directed questions about herself with the exception of whether or not she has any pain.  She does state that she has anxiety regarding her family "needing help."  She states she is not in any pain.  She denies any hallucinations or delusions.  No SI or HI.  Per triage nurse, patient was noted to be "talking to someone who was not there."  Level five caveat for altered mental status  Past Medical History:  Diagnosis Date  . Bipolar disorder ([place])   . Depression   . HSV (herpes simplex virus) infection   . Panic attacks   . Thyroid disease     Patient Active Problem List   Diagnosis Date Noted  . Abnormal uterine bleeding (AUB) 12/09/2018  . Breast cancer screening 12/09/2018  . Hyperthyroidism 11/24/2016  . Bipolar disorder, current episode manic severe with psychotic features ([place]) 09/16/2016  . PTSD (post-traumatic stress disorder) 09/16/2016  . Panic disorder 09/16/2016  . Low TSH level 05/30/2016  . Heart palpitations 05/30/2016  . Bipolar I disorder, most recent episode (or current) manic ([place])  12/22/2012  . 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 PANIC ATTACKS 04/02/2006  . TOBACCO DEPENDENCE 04/02/2006  . RHINITIS, ALLERGIC 04/02/2006  . GASTROESOPHAGEAL REFLUX, NO ESOPHAGITIS 04/02/2006    Past Surgical History:  Procedure Laterality Date  . ADENOIDECTOMY    . OVARIAN CYST SURGERY    . TONSILLECTOMY       OB History    Gravida  1   Para  1   Term  1   Preterm      AB      Living  1     SAB      TAB      Ectopic      Multiple      Live Births  1           Family History  Problem Relation Age of Onset  . Diabetes Father   . Heart disease Father   . Cancer Maternal Grandmother        breast cancer  . Bipolar disorder Mother   . Thyroid disease Neg Hx     Social History   Tobacco Use  . Smoking status: Former Smoker    Quit date: 01/18/2012    Years since quitting: 7.4  . Smokeless tobacco: Never Used  Substance Use Topics  . Alcohol use: No  . Drug use: No    Home Medications Prior to Admission medications   Medication Sig Start  Date End Date Taking? Authorizing Provider  famotidine (PEPCID) 20 MG tablet Take 1 tablet (20 mg total) by mouth 2 (two) times daily. 05/27/19   Nche, Charlene Brooke, NP  LORazepam (ATIVAN) 1 MG tablet Take 1 mg by mouth 3 (three) times daily. 11/11/16   [provider]    Allergies    Penicillins  Review of Systems   Review of Systems  Respiratory: Negative for shortness of breath.   Cardiovascular: Negative for chest pain.  Psychiatric/Behavioral: Negative for confusion, hallucinations and self-injury. The patient is nervous/anxious.   All other systems reviewed and are negative.   Physical Exam Updated Vital Signs BP (!) 154/84 (BP Location: Right Arm)   Pulse 98   Temp 99 F (37.2 C) (Oral)   Resp 18   SpO2 99%   Physical Exam Vitals and nursing note reviewed.  Constitutional:      Appearance: She is well-developed. She is not ill-appearing.      Comments: Tearful, emotionally labile  HENT:     Head: Normocephalic and atraumatic.     Mouth/Throat:     Mouth: Mucous membranes are moist.  Eyes:     Pupils: Pupils are equal, round, and reactive to light.  Cardiovascular:     Rate and Rhythm: Normal rate and regular rhythm.     Heart sounds: Normal heart sounds.  Pulmonary:     Effort: Pulmonary effort is normal. No respiratory distress.     Breath sounds: No wheezing.  Abdominal:     General: Bowel sounds are normal.     Palpations: Abdomen is soft.     Tenderness: There is no abdominal tenderness.  Musculoskeletal:     Cervical back: Neck supple.     Right lower leg: No edema.     Left lower leg: No edema.  Skin:    General: Skin is warm and dry.  Neurological:     Mental Status: She is alert and oriented to person, place, and time.  Psychiatric:     Comments: Tangential thought content, will not make eye contact or answer directed questions, slow to answer     ED Results / Procedures / Treatments   Labs (all labs ordered are listed, but only abnormal results are displayed) Labs Reviewed  COMPREHENSIVE METABOLIC PANEL - Abnormal; Notable for the following components:      Result Value   Glucose, Bld 110 (*)    All other components within normal limits  SALICYLATE LEVEL - Abnormal; Notable for the following components:   Salicylate Lvl [id number] (*)    All other components within normal limits  ACETAMINOPHEN LEVEL - Abnormal; Notable for the following components:   Acetaminophen (Tylenol), Serum <10 (*)    All other components within normal limits  CBC - Abnormal; Notable for the following components:   WBC 11.9 (*)    All other components within normal limits  ETHANOL  RAPID URINE DRUG SCREEN, HOSP PERFORMED  I-STAT BETA HCG BLOOD, ED ([facility], [facility], [facility] ONLY)    EKG None  Radiology No results found.  Procedures Procedures (including critical care time)  Medications Ordered in ED Medications  LORazepam  (ATIVAN) injection 1 mg (1 mg Intramuscular Given 06/26/19 0340)    ED Course  I have reviewed the triage vital signs and the nursing notes.  Pertinent labs & imaging results that were available during my care of the patient were reviewed by me and considered in my medical decision making (see chart for details).  MDM Rules/Calculators/A&P                      Patient presents for evaluation.  She appears paranoid and anxious on exam.  History of bipolar disorder and on chart review has had evaluations in the past for similar.  She denies SI or HI.  She is overall nontoxic and vital signs are reassuring.  She is nonfocal on exam.  She is difficult to direct on exam and will not answer specific questions about her self but ruminates on being scared for her family including her daughter and her parents.  Lab work obtained.  Patient was given Ativan.  Lab work-up shows no specific metabolic derangement.  Salicylate, alcohol, and Tylenol levels are negative.  Patient is medically clear for TTS evaluation  Final Clinical Impression(s) / ED Diagnoses Final diagnoses:  Panic attack  Paranoia [facility])    Rx / DC Orders ED Discharge Orders    None       Laconda Basich, Barbette Hair, MD 06/26/19 [phone]

## 2019-06-26 NOTE — Progress Notes (Signed)
Pt has been accepted to [facility] per [place] in admissions for Monday, 5/24 "anytime after 10am". Number for report: [phone]. Accepting provider: Dr. Milana Na.  Eber Hong RN made aware--unfortunately , [facility] will not transport over 75 miles; therefore pt will have to be IVCed for safe transport if she meets criteria. CSW assessing and will consult with Waylan Boga NP regarding above.   Veronica Perez S. Veronica Perez, MSW, LCSW Clinical Social Worker 06/26/2019 10:03 AM

## 2019-06-26 NOTE — BH Assessment (Signed)
Tele Assessment Note   Patient Name: Veronica Perez MRN: [id number] Referring Physician: Dr. Dina Rich Location of Patient: [facility] Location of Provider: [facility] is an 44 y.o. female.  -Clinician reviewed note by Dr. Thayer Jew.  This is a 44 year old female with a history of bipolar disorder, depression, panic attacks, thyroid disease who presents for psychiatric evaluation.  Patient reports that she is here because her family needs help.  She states that specifically her mom is anemic and is not eating.  She also reports that she has an elderly father that she is concerned about as well as her 62 year old daughter.  She is tearful and labile but cannot give me specific reasons why she is concerned about her family.  Patient was brought to the ED by her 28 year old daughter who was concerned about her paranoia.  Patient will not answer any directed questions about herself with the exception of whether or not she has any pain.  She does state that she has anxiety regarding her family "needing help."  She states she is not in any pain.  She denies any hallucinations or delusions.  No SI or HI.  Per triage nurse, patient was noted to be "talking to someone who was not there."  Patient said that she was watching TV with family and "suddenlty these people popped up and started saying my name and address."  She acted incredulous that this had happened.  Pt said her father and her 17 year old daughter brought her to [facility].    Patient denies any SI or HI.  Pt is actively seeing and hearing things.  She does not admit that she is doing that.  Patient often says "I can't remember" to questions regarding past experiences.  Patient, according to triage nurse, was seeing and talking to a person not present.  Pt affect is congruent with stated depression and anxiety.  Patient is actively responding to internal stimuli.  Patient says she is often "exhausted" and has not  enough rest.  Pt reports having a poor appetite and says she has thrown up some lately.    Patient said she is seen by Dr. Reece Levy but she cannot tell when the last or next appt was.  Pt was at [facility] in 12/2012.  She admits to some subsequent hospitalizations but cannot give any details.  When asked if she felt like she was supposed to come in for inpatient care she said "yes."  -Clinician discussed patient care with Lindon Romp, FNP and he recommended inpatient care.  Clinician informed Dr. Dina Rich of disposition.  TTS to seek placement.  Diagnosis: F31.2 Bipolar 1 d/o current or most recent episode manic, with psychotic features  Past Medical History:  Past Medical History:  Diagnosis Date  . Bipolar disorder ([place])   . Depression   . HSV (herpes simplex virus) infection   . Panic attacks   . Thyroid disease     Past Surgical History:  Procedure Laterality Date  . ADENOIDECTOMY    . OVARIAN CYST SURGERY    . TONSILLECTOMY      Family History:  Family History  Problem Relation Age of Onset  . Diabetes Father   . Heart disease Father   . Cancer Maternal Grandmother        breast cancer  . Bipolar disorder Mother   . Thyroid disease Neg Hx     Social History:  reports that she quit smoking about 7 years ago. She  has never used smokeless tobacco. She reports that she does not drink alcohol or use drugs.  Additional Social History:  Alcohol / Drug Use Pain Medications: See PTA medication list Prescriptions: See PTA medication list Over the Counter: See PTA medication list History of alcohol / drug use?: No history of alcohol / drug abuse  CIWA: CIWA-Ar BP: (!) 154/84 Pulse Rate: 98 Nausea and Vomiting: no nausea and no vomiting Tactile Disturbances: none Tremor: two Auditory Disturbances: very mild harshness or ability to frighten Paroxysmal Sweats: barely perceptible sweating, palms moist Visual Disturbances: not present Anxiety: equivalent to acute panic states as seen  in severe delirium or acute schizophrenic reactions Headache, Fullness in Head: none present Agitation: moderately fidgety and restless Orientation and Clouding of Sensorium: disoriented for date by more than 2 calendar days CIWA-Ar Total: 18 COWS:    Allergies:  Allergies  Allergen Reactions  . Penicillins Hives, Itching, Nausea And Vomiting and Other (See Comments)    Has patient had a PCN reaction causing immediate rash, facial/tongue/throat swelling, SOB or lightheadedness with hypotension: No Has patient had a PCN reaction causing severe rash involving mucus membranes or skin necrosis: No Has patient had a PCN reaction that required hospitalization: No Has patient had a PCN reaction occurring within the last 10 years: No If all of the above answers are "NO", then may proceed with Cephalosporin use.    Home Medications: (Not in a hospital admission)   OB/GYN Status:  No LMP recorded. (Menstrual status: Irregular Periods).  General Assessment Data Location of Assessment: [facility] ED TTS Assessment: In system Is this a Tele or Face-to-Face Assessment?: Tele Assessment Is this an Initial Assessment or a Re-assessment for this encounter?: Initial Assessment Patient Accompanied by:: N/A Language Other than English: No Living Arrangements: Other (Comment)(Daughter lives with her.) What gender do you identify as?: Female Marital status: Single Pregnancy Status: No Living Arrangements: Other (Comment)(Pt's daughter lives with her.) Can pt return to current living arrangement?: Yes Admission Status: Voluntary Is patient capable of signing voluntary admission?: Yes Referral Source: Self/Family/Friend(Her father brought her to [facility]) Insurance type: MCD / [place]: Other (Comment)(Pt's daughter lives with her.) Name of Psychiatrist: Dr. Reece Levy Name of Therapist: None  Education Status Is patient currently in school?: No Is the patient employed,  unemployed or receiving disability?: Receiving disability income  Risk to self with the past 6 months Suicidal Ideation: No Has patient been a risk to self within the past 6 months prior to admission? : No Suicidal Intent: No Has patient had any suicidal intent within the past 6 months prior to admission? : No Is patient at risk for suicide?: No Suicidal Plan?: No Has patient had any suicidal plan within the past 6 months prior to admission? : No Access to Means: No What has been your use of drugs/alcohol within the last 12 months?: Denies Previous Attempts/Gestures: No How many times?: 0 Other Self Harm Risks: None Triggers for Past Attempts: None known Intentional Self Injurious Behavior: None Family Suicide History: No Recent stressful life event(s): Loss (Comment)(Anniversary of deceased sister's birthday) Persecutory voices/beliefs?: Yes Depression: Yes Depression Symptoms: Despondent, Isolating, Insomnia, Loss of interest in usual pleasures Substance abuse history and/or treatment for substance abuse?: No Suicide prevention information given to non-admitted patients: Not applicable  Risk to Others within the past 6 months Homicidal Ideation: No Does patient have any lifetime risk of violence toward others beyond the six months prior to admission? :  No Thoughts of Harm to Others: No Current Homicidal Intent: No Current Homicidal Plan: No Access to Homicidal Means: No Identified Victim: No one History of harm to others?: No Assessment of Violence: None Noted Violent Behavior Description: None reported Does patient have access to weapons?: No Criminal Charges Pending?: No Does patient have a court date: No Is patient on probation?: No  Psychosis Hallucinations: Auditory, Visual Delusions: Persecutory  Mental Status Report Appearance/Hygiene: Unremarkable, In scrubs Eye Contact: Good Motor Activity: Freedom of movement, Unremarkable Speech: Incoherent Level of  Consciousness: Alert Mood: Depressed, Anxious, Sad Affect: Depressed, Anxious Anxiety Level: Moderate Thought Processes: Irrelevant Judgement: Impaired Orientation: Person, Situation, Place, Time Obsessive Compulsive Thoughts/Behaviors: None  Cognitive Functioning Concentration: Poor Memory: Recent Impaired, Remote Intact Is patient IDD: No Insight: Fair Impulse Control: Fair Appetite: Poor(Pt says she has been throwing up 'not a lot" lately.) Have you had any weight changes? : No Change Sleep: Decreased Total Hours of Sleep: ("I can't recall") Vegetative Symptoms: Staying in bed, Decreased grooming  ADLScreening [facility] Assessment Services) Patient's cognitive ability adequate to safely complete daily activities?: Yes Patient able to express need for assistance with ADLs?: Yes Independently performs ADLs?: Yes (appropriate for developmental age)  Prior Inpatient Therapy Prior Inpatient Therapy: Yes Prior Therapy Dates: 12/2012 Prior Therapy Facilty/Provider(s): [facility] Reason for Treatment: depression  Prior Outpatient Therapy Prior Outpatient Therapy: Yes Prior Therapy Dates: currently Prior Therapy Facilty/Provider(s): Dr. Reece Levy Reason for Treatment: medication moangment Does patient have an ACCT team?: No Does patient have Intensive In-House Services?  : No Does patient have [facility] services? : No Does patient have P4CC services?: No  ADL Screening (condition at time of admission) Patient's cognitive ability adequate to safely complete daily activities?: Yes Is the patient deaf or have difficulty hearing?: No Does the patient have difficulty seeing, even when wearing glasses/contacts?: No Does the patient have difficulty concentrating, remembering, or making decisions?: Yes Patient able to express need for assistance with ADLs?: Yes Does the patient have difficulty dressing or bathing?: Yes(Lack of motivation) Independently performs ADLs?: Yes (appropriate for  developmental age) Does the patient have difficulty walking or climbing stairs?: Yes(Balance problems at times.) Weakness of Legs: None Weakness of Arms/Hands: None  Home Assistive Devices/Equipment Home Assistive Devices/Equipment: None    Abuse/Neglect Assessment (Assessment to be complete while patient is alone) Abuse/Neglect Assessment Can Be Completed: Yes Physical Abuse: Denies Verbal Abuse: Denies Sexual Abuse: Denies Exploitation of patient/patient's resources: Denies Self-Neglect: Denies     Regulatory affairs officer (For Healthcare) Does Patient Have a Medical Advance Directive?: No Would patient like information on creating a medical advance directive?: No - Patient declined          Disposition:  Disposition Initial Assessment Completed for this Encounter: Yes Patient referred to: Other (Comment)(To be reviewed by [facility])  This service was provided via telemedicine using a 2-way, interactive audio and video technology.  Names of all persons participating in this telemedicine service and their role in this encounter. Name: Versa Marson Role: patient  Name: Curlene Dolphin, M.S. LCAS QP Role: clinician  Name:  Role:   Name:  Role:     Raymondo Band 06/26/2019 6:14 AM

## 2019-06-26 NOTE — Progress Notes (Signed)
CSW spoke with Waylan Boga, NP, who confirmed that pt does not meet IVC criteria. CSW spoke with intake at [facility] (phone: [phone]) and cancelled bed being held for pt for tomorrow, 5/24. Per Waylan Boga NP, pt will be observed overnight and possibly discharged tomorrow after AM re-eval.    Esau Grew. Ouida Sills, MSW, LCSW Clinical Social Worker 06/26/2019 3:41 PM

## 2019-06-26 NOTE — ED Notes (Signed)
C/o headache/neck pain - states she usually takes "Lorazepam and muscle relaxers". Advised pt Pharm Tech will come complete Med Rec.

## 2019-06-26 NOTE — ED Notes (Signed)
Veronica Perez father [phone] would like to speak with pt

## 2019-06-26 NOTE — ED Notes (Signed)
Ordered breakfast--Veronica Perez 

## 2019-06-26 NOTE — ED Triage Notes (Signed)
Pt states that she came to the ER because her family needs help. Pt crying and states that her mom needs an ambulance because she is anemia, her brother needs psychiatric help and her dad and daughter need help to. Pt states this is the only reason she rushed to the ED, pt will not tell what her complaint is, pt tearful and paranoid about her family. Denies SI/HI

## 2019-06-26 NOTE — ED Notes (Addendum)
Pt arrived to Rm 49 - ambulatory - wearing burgundy scrubs - Sitter w/pt. Pt's belongings inventoried by Sharyn Lull, EMT - Locker #11 - 1 Labeled belongings bag - NO Valuables Envelope noted. Pt noted to be alert, oriented, calm, cooperative. Pt noted to be slow to respond. Denies SI/HI - AVH.

## 2019-06-26 NOTE — Progress Notes (Signed)
Patient seen and evaluated in person by this provider.  She reports feeling "exhausted" and in need of sleep.  No issues with sleep initiation, difficulty with maintenance.  No suicidal/homicidal ideations, hallucinations, or substance abuse.  Not responding to internal stimuli on assessment, denies any concerns except need for sleep.  She feels if she slept tonight she could discharge tomorrow.  Pleasant and no paranoia, focused on food and another snack.  No distress noted, medications adjusted to assist with sleep, re-evaluate in the am.  Waylan Boga, PMHNP

## 2019-06-26 NOTE — ED Provider Notes (Signed)
Emergency Medicine Observation Re-evaluation Note  GUYLA MALMBORG is a 44 y.o. female, seen on rounds today.  Pt initially presented to the ED for complaints of Panic Attack and Psychiatric Evaluation Currently, the patient is sleeping.  Physical Exam  BP 133/88 (BP Location: Left Arm)   Pulse 87   Temp 98.7 F (37.1 C) (Oral)   Resp 16   SpO2 100%  Physical Exam Sleeping, respirations even unlabored ED Course / MDM  EKG:    I have reviewed the labs performed to date as well as medications administered while in observation.  Recent changes in the last 24 hours include patient requesting her regularly prescribed Valium. Plan  Current plan is for accepted to [facility].  Nurse petitioner behavioral health has reviewed patient's medications and will initiate any necessary orders. Patient is not under full IVC at this time.   Tacy Learn, PA-C 06/26/19 [place], MD 06/29/19 [phone]

## 2019-06-26 NOTE — ED Notes (Signed)
Updated pt's mother as pt's father had called - [phone] - advised pt is resting and will advise pt to call them when she wakes.

## 2019-06-26 NOTE — Progress Notes (Signed)
Patient meets criteria for inpatient treatment. There are no appropriate beds available at [facility] currently. CSW faxed referrals to the following facilities for review:  [facility]  TTS will continue to seek bed placement.   Maxie Better, MSW, LCSW Clinical Social Worker 06/26/2019 9:49 AM

## 2019-06-27 ENCOUNTER — Inpatient Hospital Stay (HOSPITAL_COMMUNITY)
Admission: AD | Admit: 2019-06-27 | Discharge: 2019-07-03 | DRG: 885 | Disposition: A | Discharge status: Home / Self Care | Payer: Medicare Other | Source: Intra-hospital | Attending: Psychiatry | Admitting: Psychiatry

## 2019-06-27 DIAGNOSIS — R1013 Epigastric pain: Secondary | ICD-10-CM

## 2019-06-27 DIAGNOSIS — E0590 Thyrotoxicosis, unspecified without thyrotoxic crisis or storm: Secondary | ICD-10-CM | POA: Diagnosis not present

## 2019-06-27 DIAGNOSIS — F410 Panic disorder [episodic paroxysmal anxiety] without agoraphobia: Secondary | ICD-10-CM | POA: Diagnosis not present

## 2019-06-27 DIAGNOSIS — Z8249 Family history of ischemic heart disease and other diseases of the circulatory system: Secondary | ICD-10-CM

## 2019-06-27 DIAGNOSIS — Z87891 Personal history of nicotine dependence: Secondary | ICD-10-CM | POA: Diagnosis not present

## 2019-06-27 DIAGNOSIS — J309 Allergic rhinitis, unspecified: Secondary | ICD-10-CM | POA: Diagnosis not present

## 2019-06-27 DIAGNOSIS — Z818 Family history of other mental and behavioral disorders: Secondary | ICD-10-CM

## 2019-06-27 DIAGNOSIS — R5383 Other fatigue: Secondary | ICD-10-CM

## 2019-06-27 DIAGNOSIS — Z880 Allergy status to penicillin: Secondary | ICD-10-CM | POA: Diagnosis not present

## 2019-06-27 DIAGNOSIS — Z20822 Contact with and (suspected) exposure to covid-19: Secondary | ICD-10-CM | POA: Diagnosis not present

## 2019-06-27 DIAGNOSIS — Z833 Family history of diabetes mellitus: Secondary | ICD-10-CM

## 2019-06-27 DIAGNOSIS — K219 Gastro-esophageal reflux disease without esophagitis: Secondary | ICD-10-CM | POA: Diagnosis present

## 2019-06-27 DIAGNOSIS — F312 Bipolar disorder, current episode manic severe with psychotic features: Principal | ICD-10-CM | POA: Diagnosis present

## 2019-06-27 DIAGNOSIS — F4310 Post-traumatic stress disorder, unspecified: Secondary | ICD-10-CM | POA: Diagnosis present

## 2019-06-27 NOTE — BH Assessment (Signed)
Per Oletta Lamas, NP, patient is accepted to [facility] (adult unit) for admission after 10pm 06/27/2019. The attending provider is Dr. Mallie Darting. Room 504-01. Nursing report [phone]. Patient's nurse-Philip Edwina Barth, RN made aware of patient's disposition.

## 2019-06-27 NOTE — ED Notes (Signed)
Pt requesting her mother be called so that she can talk to her daughter.  Attempted to call pt's mother but no answer.  Pt made aware

## 2019-06-27 NOTE — ED Notes (Signed)
Pt placed at [facility] bed 1 per jasmine

## 2019-06-27 NOTE — ED Notes (Signed)
Pt TTS'ed

## 2019-06-27 NOTE — ED Notes (Signed)
Pt's mother updated by this nurse.

## 2019-06-27 NOTE — ED Notes (Signed)
Pt awoken from sleep tearful stating "the back of my head hurts". Difficult to convey more information. See MAR for med admin.

## 2019-06-27 NOTE — ED Notes (Signed)
Breakfast Ordered 

## 2019-06-27 NOTE — ED Notes (Signed)
Pt continues to sob, without tear production. Difficult to console. Pt refused to swallow pills once in her mouth. This RN attempted multiple times to help console her for med administration. Pt continued to sob while complaining of her head/back aching, but refused to either swallow or spit out pills. Approx 8 oz of water was given in numerous sips to help pt swallow pills. Pt had no issues swallowing, but refused to consume her meds. Within a few minutes, pills had dissolved on pt's tongue and med was successfully administered. Within a few minutes pt was sleeping again.

## 2019-06-27 NOTE — ED Notes (Signed)
Pt alert and trying to eat. Pt is no longer crying relentlessly.

## 2019-06-27 NOTE — ED Notes (Signed)
Mahita Rope mother [phone] looking for an update on pt

## 2019-06-27 NOTE — ED Notes (Signed)
Pt signed voluntary admission paper for treatment.

## 2019-06-27 NOTE — ED Notes (Signed)
Pt needing constant prompting to take medication. Pt very tearful and only crying when attempting to talk with the pt. Was able to get pt medications with the assistance of the sitter.   Pt complaining  Of

## 2019-06-27 NOTE — Progress Notes (Signed)
Patient ID: Veronica Perez, female   DOB: Mar 05, 1975, 44 y.o.   MRN: [id number] Psychiatric reassessment   HPI: Veronica Perez is an 44 y.o. female.  -Clinician reviewed note by Dr. Thayer Jew.  This is a 44 year old female with a history of bipolar disorder, depression, panic attacks, thyroid disease who presents for psychiatric evaluation. Patient reports that she is here because her family needs help. She states that specifically her mom is anemic and is not eating. She also reports that she has an elderly father that she is concerned about as well as her 50 year old daughter. She is tearful and labile but cannot give me specific reasons why she is concerned about her family. Patient was brought to the ED by her 11 year old daughter who was concerned about her paranoia. Patient will not answer any directed questions about herself with the exception of whether or not she has any pain. She does state that she has anxiety regarding her family "needing help." She states she is not in any pain. She denies any hallucinations or delusions. No SI or HI. Per triage nurse, patient was noted to be "talking to someone who was not there."  Patient said that she was watching TV with family and "suddenlty these people popped up and started saying my name and address."  She acted incredulous that this had happened.  Pt said her father and her 73 year old daughter brought her to [facility].    Patient denies any SI or HI.  Pt is actively seeing and hearing things.  She does not admit that she is doing that.  Patient often says "I can't remember" to questions regarding past experiences.  Patient, according to triage nurse, was seeing and talking to a person not present.  Pt affect is congruent with stated depression and anxiety.  Patient is actively responding to internal stimuli.  Patient says she is often "exhausted" and has not enough rest.  Pt reports having a poor appetite and says she has thrown up  some lately.    Patient said she is seen by Dr. Reece Levy but she cannot tell when the last or next appt was.  Pt was at [facility] in 12/2012.  She admits to some subsequent hospitalizations but cannot give any details.  When asked if she felt like she was supposed to come in for inpatient care she said "yes."  Psychiatric evaluation: Veronica Perez is an 44 y.o. female. who presented to [facility] for concerns as noted above. Patient psychiatric history is significant for bipolar disorder, depression, panic attacks and per chart review, in the distant past, patient has presented to the ED following reports of paranoia and delusions and she has too had psychiatric hospitalizations to include [facility] and [facility]   During this evaluation, patient was noted to be very labile. She was a very  poor historian as she provided no details as to why she was admitted to the hospital and only grabbed her head and stated," In pain." She rocked back and forth throughout the evaluation. Her eyes remains closed. She stated she had thoughts of wanting to harm herself, although did not provide any further details. She denied HI and did not answer about AVH, paranoia or other psychosis. She did not provide a reply in  response to sleep. She at times would cry and not answer questions. The evaluation was discontinued.   Disposition: Patient very labile during the evaluation. She does not appear stable for discharge. I discussed her case  During bed meeting with [facility] team and inpatient psychiatric admission is recommended. [facility] will notify the ED if there are appropriate beds available here at [facility]. If not, patient will be referred out.

## 2019-06-28 ENCOUNTER — Other Ambulatory Visit: Payer: Self-pay

## 2019-06-28 ENCOUNTER — Encounter (HOSPITAL_COMMUNITY): Payer: Self-pay | Admitting: Behavioral Health

## 2019-06-28 ENCOUNTER — Inpatient Hospital Stay (HOSPITAL_COMMUNITY): Payer: Medicare Other

## 2019-06-28 DIAGNOSIS — F312 Bipolar disorder, current episode manic severe with psychotic features: Principal | ICD-10-CM

## 2019-06-28 LAB — COMPREHENSIVE METABOLIC PANEL
ALT: 19 U/L (ref 0–44)
AST: 13 U/L — ABNORMAL LOW (ref 15–41)
Albumin: 4.1 g/dL (ref 3.5–5.0)
Alkaline Phosphatase: 49 U/L (ref 38–126)
Anion gap: 8 (ref 5–15)
BUN: 10 mg/dL (ref 6–20)
CO2: 27 mmol/L (ref 22–32)
Calcium: 8.6 mg/dL — ABNORMAL LOW (ref 8.9–10.3)
Chloride: 104 mmol/L (ref 98–111)
Creatinine, Ser: 0.69 mg/dL (ref 0.44–1.00)
GFR calc Af Amer: >60 mL/min (ref >60)
GFR calc non Af Amer: >60 mL/min (ref >60)
Glucose, Bld: 99 mg/dL (ref 70–99)
Potassium: 3.9 mmol/L (ref 3.5–5.1)
Sodium: 139 mmol/L (ref 135–145)
Total Bilirubin: 0.5 mg/dL (ref 0.3–1.2)
Total Protein: 7 g/dL (ref 6.5–8.1)

## 2019-06-28 LAB — URINALYSIS, COMPLETE (UACMP) WITH MICROSCOPIC
Bacteria, UA: NONE SEEN
Bilirubin Urine: NEGATIVE
Glucose, UA: NEGATIVE mg/dL
Hgb urine dipstick: NEGATIVE
Ketones, ur: NEGATIVE mg/dL
Leukocytes,Ua: NEGATIVE
Nitrite: NEGATIVE
Protein, ur: NEGATIVE mg/dL
Specific Gravity, Urine: 1.025 (ref 1.005–1.030)
pH: 6 (ref 5.0–8.0)

## 2019-06-28 LAB — CBC WITH DIFFERENTIAL/PLATELET
Abs Immature Granulocytes: 0.02 10*3/uL (ref 0.00–0.07)
Basophils Absolute: 0 10*3/uL (ref 0.0–0.1)
Basophils Relative: 0 %
Eosinophils Absolute: 0.1 10*3/uL (ref 0.0–0.5)
Eosinophils Relative: 1 %
HCT: 40.1 % (ref 36.0–46.0)
Hemoglobin: 13.5 g/dL (ref 12.0–15.0)
Immature Granulocytes: 0 %
Lymphocytes Relative: 27 %
Lymphs Abs: 2.6 10*3/uL (ref 0.7–4.0)
MCH: 28.5 pg (ref 26.0–34.0)
MCHC: 33.7 g/dL (ref 30.0–36.0)
MCV: 84.8 fL (ref 80.0–100.0)
Monocytes Absolute: 0.7 10*3/uL (ref 0.1–1.0)
Monocytes Relative: 7 %
Neutro Abs: 6.2 10*3/uL (ref 1.7–7.7)
Neutrophils Relative %: 65 %
Platelets: 307 10*3/uL (ref 150–400)
RBC: 4.73 MIL/uL (ref 3.87–5.11)
RDW: 12.1 % (ref 11.5–15.5)
WBC: 9.6 10*3/uL (ref 4.0–10.5)
nRBC: 0 % (ref 0.0–0.2)

## 2019-06-28 LAB — AMMONIA: Ammonia: 43 umol/L — ABNORMAL HIGH (ref 9–35)

## 2019-06-28 LAB — LIPID PANEL
Cholesterol: 165 mg/dL (ref 0–200)
HDL: 35 mg/dL — ABNORMAL LOW (ref >40)
LDL Cholesterol: 103 mg/dL — ABNORMAL HIGH (ref 0–99)
Total CHOL/HDL Ratio: 4.7 RATIO
Triglycerides: 136 mg/dL (ref <150)
VLDL: 27 mg/dL (ref 0–40)

## 2019-06-28 LAB — GLUCOSE, CAPILLARY: Glucose-Capillary: 85 mg/dL (ref 70–99)

## 2019-06-28 LAB — HEMOGLOBIN A1C
Hgb A1c MFr Bld: 5 % (ref 4.8–5.6)
Mean Plasma Glucose: 96.8 mg/dL

## 2019-06-28 LAB — TSH: TSH: <0.01 u[IU]/mL — ABNORMAL LOW (ref 0.350–4.500)

## 2019-06-28 MED ORDER — IBUPROFEN 600 MG PO TABS: 600.0000 mg | ORAL_TABLET | Freq: Three times a day (TID) | ORAL | Status: DC | PRN

## 2019-06-28 MED ORDER — LORAZEPAM 0.5 MG PO TABS
0.5000 mg | ORAL_TABLET | Freq: Two times a day (BID) | ORAL | Status: DC
  Administered 2019-06-30 – 2019-07-03 (×7): 0.5 mg via ORAL
  Filled 2019-06-28 (×8): qty 1

## 2019-06-28 MED ORDER — LORAZEPAM 1 MG PO TABS
1.0000 mg | ORAL_TABLET | Freq: Three times a day (TID) | ORAL | Status: DC
  Administered 2019-06-28: 1 mg via ORAL
  Filled 2019-06-28 (×2): qty 1

## 2019-06-28 MED ORDER — FLUOXETINE HCL 20 MG PO CAPS
20.0000 mg | ORAL_CAPSULE | Freq: Every day | ORAL | Status: DC
  Administered 2019-06-28: 20 mg via ORAL
  Filled 2019-06-28 (×6): qty 1

## 2019-06-28 NOTE — Tx Team (Signed)
Initial Treatment Plan 06/28/2019 12:36 AM Veronica Perez [id number]    PATIENT STRESSORS: Marital or family conflict Medication change or noncompliance   PATIENT STRENGTHS: Motivation for treatment/growth Physical Health   PATIENT IDENTIFIED PROBLEMS: Insomnia  Decrease in Stamina                   DISCHARGE CRITERIA:  Improved stabilization in mood, thinking, and/or behavior Withdrawal symptoms are absent or subacute and managed without 24-hour nursing intervention  PRELIMINARY DISCHARGE PLAN: Return to previous living arrangement Return to previous work or school arrangements  PATIENT/FAMILY INVOLVEMENT: This treatment plan has been presented to and reviewed with the patient, Veronica Perez.  The patient has been given the opportunity to ask questions and make suggestions.  Johnnye Lana, RN 06/28/2019, 12:36 AM

## 2019-06-28 NOTE — ED Provider Notes (Signed)
[facility] DEPT Provider Note   CSN: [id number] Arrival date & time: 06/28/19  1843     History Chief Complaint  Patient presents with  . Fatigue    Veronica Perez is a 44 y.o. female.  HPI LEVEL 5 CAVEAT 61/81 AMS  44 year old female with a history of bipolar disorder, PTSD, panic attacks, tobacco dependence, low TSH levels, hyperthyroidism presents to the ER from [facility]. History gathered from chart review and Dr. Ayesha Rumpf who spoke with Dr. Parke Poisson. Patient was admitted to [facility] on 06/26/2019, she was alert and oriented, and responsive. As of today, patient has been noted to be increasingly somnolent and not easily arousable. Chart review noted that the patient was more lethargic than normal. She awoke only to her name being called loudly, was mumbling softly unintelligibly at times. Was unable to provide meaningful information. Her vitals were stable. Basic labs showed unremarkable CMP, CBC with mildly elevated leukocytosis of 11.9. A1c of 5. TSH was suppressed at 0.01, though she does have a history of this. There was no noticeable neuro deficits, patient was moving all 4 extremities. Patient had received 1 mg of Ativan earlier today but no other sedating medications. Dr. Parke Poisson spoke with Dr. Ayesha Rumpf, and it was established that the patient should come to the ED for further work-up.     Past Medical History:  Diagnosis Date  . Bipolar disorder ([place])   . Depression   . HSV (herpes simplex virus) infection   . Panic attacks   . Thyroid disease     Patient Active Problem List   Diagnosis Date Noted  . Abnormal uterine bleeding (AUB) 12/09/2018  . Breast cancer screening 12/09/2018  . Hyperthyroidism 11/24/2016  . Bipolar disorder, current episode manic severe with psychotic features ([place]) 09/16/2016  . PTSD (post-traumatic stress disorder) 09/16/2016  . Panic disorder 09/16/2016  . Low TSH level 05/30/2016  . Heart palpitations 05/30/2016  . Bipolar I  disorder, most recent episode (or current) manic ([place]) 12/22/2012  . 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 PANIC ATTACKS 04/02/2006  . TOBACCO DEPENDENCE 04/02/2006  . RHINITIS, ALLERGIC 04/02/2006  . GASTROESOPHAGEAL REFLUX, NO ESOPHAGITIS 04/02/2006    Past Surgical History:  Procedure Laterality Date  . ADENOIDECTOMY    . OVARIAN CYST SURGERY    . TONSILLECTOMY       OB History    Gravida  1   Para  1   Term  1   Preterm      AB      Living  1     SAB      TAB      Ectopic      Multiple      Live Births  1           Family History  Problem Relation Age of Onset  . Diabetes Father   . Heart disease Father   . Cancer Maternal Grandmother        breast cancer  . Bipolar disorder Mother   . Thyroid disease Neg Hx     Social History   Tobacco Use  . Smoking status: Former Smoker    Quit date: 01/18/2012    Years since quitting: 7.4  . Smokeless tobacco: Never Used  Substance Use Topics  . Alcohol use: Not Currently    Comment: consumes less than 4x year  . Drug use: No    Home Medications Prior to Admission medications  Medication Sig Start Date End Date Taking? Authorizing Provider  acetaminophen (TYLENOL) 500 MG tablet Take 1,000 mg by mouth every 6 (six) hours as needed for mild pain or moderate pain.    [provider]  famotidine (PEPCID) 20 MG tablet Take 1 tablet (20 mg total) by mouth 2 (two) times daily. Patient not taking: Reported on 06/26/2019 05/27/19   Nche, Charlene Brooke, NP  LORazepam (ATIVAN) 1 MG tablet Take 1 mg by mouth 3 (three) times daily. 11/11/16   [provider]    Allergies    Penicillins  Review of Systems   Review of Systems  Unable to perform ROS: Mental status change    Physical Exam Updated Vital Signs BP 113/64 (BP Location: Right Arm)   Pulse 89   Temp 98.6 F (37 C) (Oral)   Resp 19   LMP 06/20/2019 (Within Days) Comment: per pt 'last week'   SpO2 100%   Physical Exam Vitals and nursing note reviewed.  Constitutional:      General: She is not in acute distress.    Appearance: She is well-developed.  HENT:     Head: Normocephalic and atraumatic.  Eyes:     Conjunctiva/sclera: Conjunctivae normal.  Cardiovascular:     Rate and Rhythm: Normal rate and regular rhythm.     Heart sounds: No murmur.  Pulmonary:     Effort: Pulmonary effort is normal. No respiratory distress.     Breath sounds: Normal breath sounds.  Abdominal:     Palpations: Abdomen is soft.     Tenderness: There is no abdominal tenderness.  Musculoskeletal:     Cervical back: Neck supple.  Skin:    General: Skin is warm and dry.  Neurological:     General: No focal deficit present.     Mental Status: She is alert.  Psychiatric:        Attention and Perception: She does not perceive visual hallucinations.        Mood and Affect: Mood is depressed.        Speech: Speech is delayed and slurred.        Behavior: Behavior is slowed and withdrawn.     Comments: Patient sleeping in the ER bed. Only responds to sternal rub, awakens and just starts crying but unable to tell me why. Alert and oriented x0. Does not respond to questions. Does not follow commands. Patient moving all 4 extremities without difficulty.     ED Results / Procedures / Treatments   Labs (all labs ordered are listed, but only abnormal results are displayed) Labs Reviewed  LIPID PANEL - Abnormal; Notable for the following components:      Result Value   HDL 35 (*)    LDL Cholesterol 103 (*)    All other components within normal limits  TSH - Abnormal; Notable for the following components:   TSH <0.010 (*)    All other components within normal limits  HEMOGLOBIN A1C  GLUCOSE, CAPILLARY  PROLACTIN  T3, FREE  T4, FREE  CBC WITH DIFFERENTIAL/PLATELET  COMPREHENSIVE METABOLIC PANEL  AMMONIA  URINALYSIS, COMPLETE (UACMP) WITH MICROSCOPIC    EKG None  Radiology CT Head Wo  Contrast  Result Date: 06/28/2019 CLINICAL DATA:  Altered mental status EXAM: CT HEAD WITHOUT CONTRAST TECHNIQUE: Contiguous axial images were obtained from the base of the skull through the vertex without intravenous contrast. COMPARISON:  05/28/2019 FINDINGS: Brain: There is no acute intracranial hemorrhage, mass effect, or edema. Gray-white differentiation is preserved.  There is no extra-axial fluid collection. Ventricles and sulci are within normal limits in size and configuration. Vascular: No hyperdense vessel or unexpected calcification. Skull: Calvarium is unremarkable. Sinuses/Orbits: Patchy left ethmoid opacification. Orbits are unremarkable. Other: None. IMPRESSION: No acute intracranial abnormality. Electronically Signed   By: Macy Mis M.D.   On: 06/28/2019 20:07    Procedures Procedures (including critical care time)  Medications Ordered in ED Medications  ibuprofen (ADVIL) tablet 600 mg (has no administration in time range)  FLUoxetine (PROZAC) capsule 20 mg (20 mg Oral Given 06/28/19 0752)  LORazepam (ATIVAN) tablet 0.5 mg (has no administration in time range)    ED Course  I have reviewed the triage vital signs and the nursing notes.  Pertinent labs & imaging results that were available during my care of the patient were reviewed by me and considered in my medical decision making (see chart for details).    MDM Rules/Calculators/A&P                      44 year old female with altered mental status from [facility].  On presentation to the ER, the patient is asleep in the ER bed. Does not respond to her name being called, will awaken to sternal rub, begins crying but is unable to tell me her name, where she is at, or if she is in pain. Tech at bedside cannot tell me much information about her. She is moving all 4 extremities, but will not open her eyes. No noticeable focal neuro deficits. No abdominal tenderness. No hyperreflexia,  Or evidence of serotonin syndrome. Will start  with altered mental status work-up. TSH is undetectable of 0.10, I callpuled the lab to follow-up on the free T3-T4, will poate tomorrow. Hemoglobin of 5. Glucose 85. CT of head without acute intracranial abnormality. EKG normal sinus rhythm. CBC, CMP, ammonia, UA, prolactin pending. Vitals overall reassuring.  Signed out patient to Linna Hoff, PA-C. Pending normal labs, suspect patient will be able for discharge back to [facility] for further evaluation. Suspect possible bipolar depressive episode if normal workup Final Clinical Impression(s) / ED Diagnoses Final diagnoses:  None    Rx / DC Orders ED Discharge Orders    None       Lyndel Safe 06/28/19 2107    Blanchie Dessert, MD 06/29/19 [phone]

## 2019-06-28 NOTE — Discharge Instructions (Addendum)
Return to the ED for shortness of breath, chest pain, injuries or falls.
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
## 2019-06-28 NOTE — BHH Counselor (Signed)
CSW attempted to complete PSA with this pt. Pt was sleeping and unwilling to participate in assessment at this time.    Darletta Moll MSW, [place] Worker  [facility]

## 2019-06-28 NOTE — ED Provider Notes (Signed)
Physical Exam  BP (!) 149/93   Pulse 64   Temp 98.6 F (37 C) (Oral)   Resp 16   LMP 06/20/2019 (Within Days) Comment: per pt 'last week'  SpO2 97%   Physical Exam Vitals and nursing note reviewed.  Constitutional:      General: She is not in acute distress.    Appearance: She is well-developed. She is not diaphoretic.     Comments: Crying.  Moving extremities without difficulty.  HENT:     Head: Normocephalic and atraumatic.  Eyes:     General: No scleral icterus.    Conjunctiva/sclera: Conjunctivae normal.  Pulmonary:     Effort: Pulmonary effort is normal. No respiratory distress.  Musculoskeletal:     Cervical back: Normal range of motion.  Skin:    Findings: No rash.  Neurological:     Mental Status: She is alert.     ED Course/Procedures     Procedures  MDM   Care of patient assumed from [place] at 9:00 PM.  Agree with history, physical exam and plan.  See their note for further details.  Briefly, 44 y.o. female with PMH/PSH as below who presents with a chief complaint of fatigue.  Patient was sent over from [facility] for increased somnolence.  They noted she was more lethargic than normal this morning.  She was sent to the ED for further work-up.  CT scan of the head is negative.  Chart review shows that she has a TSH of less than 0.01 but this has been chronic for her.  They also obtain T3 and T4. Patient arousable here but crying.  Past Medical History:  Diagnosis Date  . Bipolar disorder ([place])   . Depression   . HSV (herpes simplex virus) infection   . Panic attacks   . Thyroid disease    Past Surgical History:  Procedure Laterality Date  . ADENOIDECTOMY    . OVARIAN CYST SURGERY    . TONSILLECTOMY        Current Plan: Obtain remainder of lab work including CBC, CMP, ammonia level and chest x-ray.    MDM/ED Course: 10:01 PM  Lab work including CBC, CMP unremarkable.  Ammonia with slight elevation at 43.  Chest x-ray is unremarkable.  On my exam  patient is crying but is moving extremities without difficulty. Suspect that this could be related to her chronic psych diagnoses.  At this time I do not see any indication for further work-up. She will be discharged back to [place]: None   Significant labs/images: CT Head Wo Contrast  Result Date: 06/28/2019 CLINICAL DATA:  Altered mental status EXAM: CT HEAD WITHOUT CONTRAST TECHNIQUE: Contiguous axial images were obtained from the base of the skull through the vertex without intravenous contrast. COMPARISON:  05/28/2019 FINDINGS: Brain: There is no acute intracranial hemorrhage, mass effect, or edema. Gray-white differentiation is preserved. There is no extra-axial fluid collection. Ventricles and sulci are within normal limits in size and configuration. Vascular: No hyperdense vessel or unexpected calcification. Skull: Calvarium is unremarkable. Sinuses/Orbits: Patchy left ethmoid opacification. Orbits are unremarkable. Other: None. IMPRESSION: No acute intracranial abnormality. Electronically Signed   By: Macy Mis M.D.   On: 06/28/2019 20:07   DG Chest Portable 1 View  Result Date: 06/28/2019 CLINICAL DATA:  Altered mental status. EXAM: PORTABLE CHEST 1 VIEW COMPARISON:  December 18, 2012 FINDINGS: The heart size and mediastinal contours are within normal limits. Both lungs are clear. The visualized skeletal  structures are unremarkable. IMPRESSION: No active disease. Electronically Signed   By: Virgina Norfolk M.D.   On: 06/28/2019 21:16   Labs Reviewed  LIPID PANEL - Abnormal; Notable for the following components:      Result Value   HDL 35 (*)    LDL Cholesterol 103 (*)    All other components within normal limits  TSH - Abnormal; Notable for the following components:   TSH <0.010 (*)    All other components within normal limits  COMPREHENSIVE METABOLIC PANEL - Abnormal; Notable for the following components:   Calcium 8.6 (*)    AST 13 (*)    All other  components within normal limits  AMMONIA - Abnormal; Notable for the following components:   Ammonia 43 (*)    All other components within normal limits  HEMOGLOBIN A1C  GLUCOSE, CAPILLARY  CBC WITH DIFFERENTIAL/PLATELET  PROLACTIN  T3, FREE  T4, FREE  URINALYSIS, COMPLETE (UACMP) WITH MICROSCOPIC     I personally reviewed and interpreted all labs.  All imaging, if done today, including plain films, CT scans, and ultrasounds, independently reviewed by me, and interpretations confirmed via formal radiology reads.  Portions of this note were generated with [employer]. Dictation errors may occur despite best attempts at proofreading.       Delia Heady, PA-C 06/28/19 2203    Quintella Reichert, MD 06/30/19 [id number]

## 2019-06-28 NOTE — ED Triage Notes (Signed)
Patient here from [facility] reporting lethargy. States that she is more lethargic than normal. Patient has been receiving Ativan and Prozac.

## 2019-06-28 NOTE — Progress Notes (Signed)
Dar Note: Patient has been sleeping for the majority of the shift.  Patient respond to verbal and tactile stimulation.  Patient was present, awake and ambulating at the beginning of the shift.  Patient walked back to her room after morning medication administration.  Patient remained in bed resting.  EKG and vital signs obtained.  Breathing even and unlabored.  No respiratory distress noted.  Routine safety checks maintained every 15 minutes.

## 2019-06-28 NOTE — ED Notes (Signed)
[facility] called for transport to [facility]

## 2019-06-28 NOTE — [facility] Suicide Risk Assessment (Signed)
[place] INPATIENT:  Family/Significant Other Suicide Prevention Education  Suicide Prevention Education: Attempted to reach pt's mother, Shanyce Korsmo to complete SPE. CSW left a HIPAA compliant voicemail requesting a return call.    Darletta Moll MSW, [place] Worker  [facility]

## 2019-06-28 NOTE — Plan of Care (Signed)
  Problem: Education: Goal: Knowledge of [facility] Education information/materials will improve Outcome: Progressing Goal: Verbalization of understanding the information provided will improve Outcome: Progressing   Problem: Activity: Goal: Will verbalize the importance of balancing activity with adequate rest periods Outcome: Progressing   

## 2019-06-28 NOTE — H&P (Signed)
Psychiatric Admission Assessment Adult  Patient Identification: Veronica Perez MRN:  [id number] Date of Evaluation:  06/28/2019 Chief Complaint:  Bipolar disorder, current episode manic severe with psychotic features ([place]) [F31.2] Principal Diagnosis: <principal problem not specified> Diagnosis:  Active Problems:   Bipolar disorder, current episode manic severe with psychotic features ([place])  History of Present Illness:   Clinician reviewed note by Dr. Thayer Jew.  This is a 44 year old female with a history of bipolar disorder, depression, panic attacks, thyroid disease who presents for psychiatric evaluation. Patient reports that she is here because her family needs help. She states that specifically her mom is anemic and is not eating. She also reports that she has an elderly father that she is concerned about as well as her 33 year old daughter. She is tearful and labile but cannot give me specific reasons why she is concerned about her family. Patient was brought to the ED by her 52 year old daughter who was concerned about her paranoia. Patient will not answer any directed questions about herself with the exception of whether or not she has any pain. She does state that she has anxiety regarding her family "needing help." She states she is not in any pain. She denies any hallucinations or delusions. No SI or HI. Per triage nurse, patient was noted to be "talking to someone who was not there."  Patient said that she was watching TV with family and "suddenlty these people popped up and started saying my name and address."  She acted incredulous that this had happened.  Pt said her father and her 9 year old daughter brought her to [facility].    Patient denies any SI or HI.  Pt is actively seeing and hearing things.  She does not admit that she is doing that.  Patient often says "I can't remember" to questions regarding past experiences.  Patient, according to triage nurse, was seeing  and talking to a person not present.  Pt affect is congruent with stated depression and anxiety.  Patient is actively responding to internal stimuli.  Patient says she is often "exhausted" and has not enough rest.  Pt reports having a poor appetite and says she has thrown up some lately.    Patient said she is seen by Dr. Reece Levy but she cannot tell when the last or next appt was.  Pt was at [facility] in 12/2012.  She admits to some subsequent hospitalizations but cannot give any details.  When asked if she felt like she was supposed to come in for inpatient care she said "yes."  Multiple attempts to wake patient were unsuccessful.  Patient is observed to be lying in bed with sheets pulled over her head, and appears obtunded.  She does awake to vigorous stimuli, however only mumbles when writer calls name and attempts to wake patient.  Associated Signs/Symptoms: Depression Symptoms:  UTA (Hypo) Manic Symptoms:  UTA Anxiety Symptoms:  UTA Psychotic Symptoms:  UTA PTSD Symptoms: UTA Total Time spent with patient: UTA  Past Psychiatric History: UTA.   Is the patient at risk to self? Yes.    Has the patient been a risk to self in the past 6 months? No.  Has the patient been a risk to self within the distant past? No.  Is the patient a risk to others? No.  Has the patient been a risk to others in the past 6 months? No.  Has the patient been a risk to others within the distant past? No.   Prior Inpatient  Therapy:   Prior Outpatient Therapy:    Alcohol Screening: 1. How often do you have a drink containing alcohol?: Monthly or less 2. How many drinks containing alcohol do you have on a typical day when you are drinking?: 1 or 2 3. How often do you have six or more drinks on one occasion?: Never AUDIT-C Score: 1 Alcohol Brief Interventions/Follow-up: AUDIT Score <7 follow-up not indicated Substance Abuse History in the last 12 months:  No. Consequences of Substance Abuse: Negative Previous  Psychotropic Medications: Yes  Psychological Evaluations: Yes  Past Medical History:  Past Medical History:  Diagnosis Date  . Bipolar disorder ([place])   . Depression   . HSV (herpes simplex virus) infection   . Panic attacks   . Thyroid disease     Past Surgical History:  Procedure Laterality Date  . ADENOIDECTOMY    . OVARIAN CYST SURGERY    . TONSILLECTOMY     Family History:  Family History  Problem Relation Age of Onset  . Diabetes Father   . Heart disease Father   . Cancer Maternal Grandmother        breast cancer  . Bipolar disorder Mother   . Thyroid disease Neg Hx    Family Psychiatric  History:UTA  Tobacco Screening: Have you used any form of tobacco in the last 30 days? (Cigarettes, Smokeless Tobacco, Cigars, and/or Pipes): No Social History:  Social History   Substance and Sexual Activity  Alcohol Use Not Currently   Comment: consumes less than 4x year     Social History   Substance and Sexual Activity  Drug Use No    Additional Social History:      Allergies:   Allergies  Allergen Reactions  . Penicillins Hives, Itching, Nausea And Vomiting and Other (See Comments)    Has patient had a PCN reaction causing immediate rash, facial/tongue/throat swelling, SOB or lightheadedness with hypotension: No Has patient had a PCN reaction causing severe rash involving mucus membranes or skin necrosis: No Has patient had a PCN reaction that required hospitalization: No Has patient had a PCN reaction occurring within the last 10 years: No If all of the above answers are "NO", then may proceed with Cephalosporin use.   Lab Results:  Results for orders placed or performed during the hospital encounter of 06/27/19 (from the past 48 hour(s))  Hemoglobin A1c     Status: None   Collection Time: 06/28/19  6:41 AM  Result Value Ref Range   Hgb A1c MFr Bld 5.0 4.8 - 5.6 %    Comment: (NOTE) Pre diabetes:          5.7%-6.4% Diabetes:              >6.4% Glycemic  control for   <7.0% adults with diabetes    Mean Plasma Glucose 96.8 mg/dL    Comment: Performed at [facility], [place] [address] [place], [place] [address]  Lipid panel     Status: Abnormal   Collection Time: 06/28/19  6:41 AM  Result Value Ref Range   Cholesterol 165 0 - 200 mg/dL   Triglycerides 136 <150 mg/dL   HDL 35 (L) >40 mg/dL   Total CHOL/HDL Ratio 4.7 RATIO   VLDL 27 0 - 40 mg/dL   LDL Cholesterol 103 (H) 0 - 99 mg/dL    Comment:        Total Cholesterol/HDL:CHD Risk Coronary Heart Disease Risk Table  Men   Women  1/2 Average Risk   3.4   3.3  Average Risk       5.0   4.4  2 X Average Risk   9.6   7.1  3 X Average Risk  23.4   11.0        Use the calculated Patient Ratio above and the CHD Risk Table to determine the patient's CHD Risk.        ATP III CLASSIFICATION (LDL):  <100     mg/dL   Optimal  100-129  mg/dL   Near or Above                    Optimal  130-159  mg/dL   Borderline  160-189  mg/dL   High  >190     mg/dL   Very High Performed at [facility] [address], [place], [place] [address]   TSH     Status: Abnormal   Collection Time: 06/28/19  6:41 AM  Result Value Ref Range   TSH <0.010 (L) 0.350 - 4.500 uIU/mL    Comment: Performed by a 3rd Generation assay with a functional sensitivity of <=0.01 uIU/mL. Performed at [facility], [place] [address], [place], [place] [address]     Blood Alcohol level:  Lab Results  Component Value Date   ETH <10 06/26/2019   ETH <5 0000000    Metabolic Disorder Labs:  Lab Results  Component Value Date   HGBA1C 5.0 06/28/2019   MPG 96.8 06/28/2019   MPG 97 10/18/2018   Lab Results  Component Value Date   PROLACTIN 69.3 (H) 09/17/2016   Lab Results  Component Value Date   CHOL 165 06/28/2019   TRIG 136 06/28/2019   HDL 35 (L) 06/28/2019   CHOLHDL 4.7 06/28/2019   VLDL 27 06/28/2019   LDLCALC 103 (H) 06/28/2019   LDLCALC 56  12/11/2016    Current Medications: Current Facility-Administered Medications  Medication Dose Route Frequency Provider Last Rate Last Admin  . FLUoxetine (PROZAC) capsule 20 mg  20 mg Oral Daily Mordecai Maes, NP   20 mg at 06/28/19 0752  . ibuprofen (ADVIL) tablet 600 mg  600 mg Oral Q8H PRN Mordecai Maes, NP      . LORazepam (ATIVAN) tablet 1 mg  1 mg Oral TID Mordecai Maes, NP   1 mg at 06/28/19 [id number]   PTA Medications: Medications Prior to Admission  Medication Sig Dispense Refill Last Dose  . acetaminophen (TYLENOL) 500 MG tablet Take 1,000 mg by mouth every 6 (six) hours as needed for mild pain or moderate pain.     . famotidine (PEPCID) 20 MG tablet Take 1 tablet (20 mg total) by mouth 2 (two) times daily. (Patient not taking: Reported on 06/26/2019) 60 tablet 0   . LORazepam (ATIVAN) 1 MG tablet Take 1 mg by mouth 3 (three) times daily.  1     Musculoskeletal: Strength & Muscle Tone: within normal limits Gait & Station: normal Patient leans: N/A  Psychiatric Specialty Exam: Physical Exam  Review of Systems  Blood pressure 133/85, pulse 90, temperature 98.3 F (36.8 C), temperature source Oral, last menstrual period 06/20/2019, SpO2 98 %.There is no height or weight on file to calculate BMI.  General Appearance: Disheveled  Eye Contact:  Fair  Speech:  Clear and Coherent and Slow  Volume:  Decreased  Mood:  UTA  Affect:  UTA  Thought Process:  NA  Orientation:  Other:  UTA  Thought Content:  Negative  Suicidal Thoughts:  UTA  Homicidal Thoughts:  UTA  Memory:  Immediate;   NA Recent;   NA  Judgement:  NA  Insight:  NA  Psychomotor Activity:  NA  Concentration:  Concentration: NA  Recall:  NA  Fund of Knowledge:  NA  Language:  NA  Akathisia:  NA  Handed:  Right  AIMS (if indicated):     Assets:  Others:  UTA  ADL's:  Intact  Cognition:  WNL  Sleep:  Number of Hours: 2.75   Patient with history of bipolar I and depression. Per chart review is under  the care of a psychiatrist, however medical records are not available at this time. She has been seeing Dr. Reece Levy, current prescription of Lorazepam 1mg  po TID which was verified in the [facility]. Patient difficult to arouse, slow to respond, and mumbling. Although per chart review she appears to be able to communicate just responds slow. Her TSH is undetected, and patient experienced a recent fall on 04/24(CT of head negative). Case discussed with psychiatrist,  new orders placed at this time to include vital signs, EKG. It is with hope that [facility] or Endo team will come visit patient while in the hospital. At the time of this evaluation patient is no apparent distressed, Free T3 and Free T4 are pending add on.   Treatment Plan Summary: Plan Unable to assess patient and she remained an obtunded state.  Will obtain EKG.  We will also obtain additional set of vital signs.  Add on free T3 and free T4 to labs that were drawn this morning.  Patient with TSH undetectable, will likely need assistance from endocrinology to help with management of this condition., consult placed. Will reduce Lorazepam 1mg  po TID to 0.5 mg po TID.   Hyperthyroidism: Free T3 and Free T4 added. Consult pending. Patient with weight loss of 15lbs in 6 months, diarrhea, and recent fall. Should consider ordering thyroid antibodies. Patient currently on no thyroid agents at this time, hx of hypothyroidism.   Psychosis: Will continue to monitor. UTA if patient has ongoing hallucinations or psychosis. These findings maybe secondary to her tsh levels.   Observation Level/Precautions:  15 minute checks  Laboratory:  Labs obtained in the ED have been reviewed and reassessed. Patient TSH is undetectable. Labs are normal except UDS positive for BZD.   Psychotherapy:  Individual and group therapy  Medications:  See above. WIll continue prozac 20mg  po daily.   Consultations:  Endocrinology and /or [facility]   Discharge Concerns:  Safety    Estimated LOS: 3-5 days  Other:     Physician Treatment Plan for Primary Diagnosis: <principal problem not specified> Long Term Goal(s): Improvement in symptoms so as ready for discharge  Short Term Goals: Ability to identify changes in lifestyle to reduce recurrence of condition will improve, Ability to verbalize feelings will improve, Ability to disclose and discuss suicidal ideas and Ability to demonstrate self-control will improve  Physician Treatment Plan for Secondary Diagnosis: Active Problems:   Bipolar disorder, current episode manic severe with psychotic features ([place])  Long Term Goal(s): Improvement in symptoms so as ready for discharge  Short Term Goals: Ability to identify and develop effective coping behaviors will improve, Ability to maintain clinical measurements within normal limits will improve, Compliance with prescribed medications will improve and Ability to identify triggers associated with substance abuse/mental health issues will improve  I certify that inpatient services furnished can reasonably  be expected to improve the patient's condition.    Suella Broad, FNP 5/25/20219:01 AM

## 2019-06-29 LAB — PROLACTIN: Prolactin: 13.1 ng/mL (ref 4.8–23.3)

## 2019-06-29 MED ORDER — HALOPERIDOL 5 MG PO TABS
10.0000 mg | ORAL_TABLET | Freq: Two times a day (BID) | ORAL | Status: DC
  Administered 2019-06-30: 10 mg via ORAL
  Filled 2019-06-29 (×4): qty 2

## 2019-06-29 MED ORDER — HALOPERIDOL LACTATE 5 MG/ML IJ SOLN
10.0000 mg | Freq: Two times a day (BID) | INTRAMUSCULAR | Status: DC
  Administered 2019-06-29: 10 mg via INTRAMUSCULAR
  Filled 2019-06-29 (×5): qty 2

## 2019-06-29 MED ORDER — RISPERIDONE 1 MG PO TBDP
1.0000 mg | ORAL_TABLET | Freq: Every day | ORAL | Status: DC
  Filled 2019-06-29 (×3): qty 1

## 2019-06-29 MED ORDER — RISPERIDONE 2 MG PO TBDP
2.0000 mg | ORAL_TABLET | Freq: Three times a day (TID) | ORAL | Status: DC | PRN
  Administered 2019-06-29: 2 mg via ORAL
  Filled 2019-06-29: qty 2

## 2019-06-29 MED ORDER — LORAZEPAM 1 MG PO TABS: 1.0000 mg | ORAL_TABLET | ORAL | Status: DC | PRN

## 2019-06-29 MED ORDER — RISPERIDONE 2 MG PO TBDP
2.0000 mg | ORAL_TABLET | Freq: Every day | ORAL | Status: DC
  Filled 2019-06-29 (×2): qty 1

## 2019-06-29 MED ORDER — METHIMAZOLE 10 MG PO TABS
10.0000 mg | ORAL_TABLET | Freq: Two times a day (BID) | ORAL | Status: DC
  Administered 2019-06-30: 10 mg via ORAL
  Filled 2019-06-29 (×13): qty 1

## 2019-06-29 MED ORDER — ZIPRASIDONE MESYLATE 20 MG IM SOLR: 20.0000 mg | INTRAMUSCULAR | Status: DC | PRN

## 2019-06-29 NOTE — Progress Notes (Signed)
   06/29/19 0605  Vital Signs  Pulse Rate 79  Resp 20  BP (!) 148/97  BP Location Right Arm  BP Method Automatic  Patient Position (if appropriate) Sitting  D:  Patient isolated in her room the entire shift. Patient refused medicine. Patient sat up briefly in bed and cried and whined with her eyes closed.  Later in the afternoon after refusing po haldol patient was given 10 mg of haldol IM. Patient sat up briefly and held on to female techs gloved hand and drank some icewater. A:  Support and encouragement provided Routine safety checks conducted every 15 minutes. Patient  Informed to notify staff with any concerns.  R:  Safety maintained.

## 2019-06-29 NOTE — Progress Notes (Signed)
[facility] MD Progress Note  06/29/2019 12:17 PM Veronica Perez  MRN:  [id number] Subjective: Patient is a 44 year old female with a past psychiatric history significant for bipolar disorder, panic disorder, hyperthyroidism who presented to the [facility] emergency department on 06/27/2019 secondary to debility, tearfulness, paranoia.  Objective: Patient is seen and examined.  Patient is a 44 year old female with the above-stated past psychiatric history who is seen in follow-up.  The patient is not a great historian today.  She is laying in bed awake, but tearful but will not communicate.  She is rolled up in the fetal position.  She was sent to the emergency department secondary to lethargy.  Her CT scan of the head was negative, and it was noted about her TSH being less than 0.01.  No other treatment was initiated.  Her ammonia was mildly elevated at 43.  Her AST is 13, her ALT is 19.  Albumin was normal at 4.1.  Review of the rest of her laboratories were all essentially normal except for the TSH.  Chest x-ray, EKG and CT of the head were all normal.  Her blood pressure this morning is mildly elevated at 148/97.  She only slept approximately 3 hours last night.  Principal Problem: <principal problem not specified> Diagnosis: Active Problems:   Bipolar disorder, current episode manic severe with psychotic features ([place])  Total Time spent with patient: 20 minutes  Past Psychiatric History: See admission H&P  Past Medical History:  Past Medical History:  Diagnosis Date  . Bipolar disorder ([place])   . Depression   . HSV (herpes simplex virus) infection   . Panic attacks   . Thyroid disease     Past Surgical History:  Procedure Laterality Date  . ADENOIDECTOMY    . OVARIAN CYST SURGERY    . TONSILLECTOMY     Family History:  Family History  Problem Relation Age of Onset  . Diabetes Father   . Heart disease Father   . Cancer Maternal Grandmother        breast cancer  .  Bipolar disorder Mother   . Thyroid disease Neg Hx    Family Psychiatric  History: See admission H&P Social History:  Social History   Substance and Sexual Activity  Alcohol Use Not Currently   Comment: consumes less than 4x year     Social History   Substance and Sexual Activity  Drug Use No    Social History   Socioeconomic History  . Marital status: Single    Spouse name: Not on file  . Number of children: Not on file  . Years of education: Not on file  . Highest education level: High school graduate  Occupational History  . Not on file  Tobacco Use  . Smoking status: Former Smoker    Quit date: 01/18/2012    Years since quitting: 7.4  . Smokeless tobacco: Never Used  Substance and Sexual Activity  . Alcohol use: Not Currently    Comment: consumes less than 4x year  . Drug use: No  . Sexual activity: Yes    Birth control/protection: None  Other Topics Concern  . Not on file  Social History Narrative  . Not on file   Social Determinants of Health   Financial Resource Strain:   . Difficulty of Paying Living Expenses:   Food Insecurity:   . Worried About Charity fundraiser in the Last Year:   . Peter in the Last Year:  Transportation Needs:   . Film/video editor (Medical):   Marland Kitchen Lack of Transportation (Non-Medical):   Physical Activity:   . Days of Exercise per Week:   . Minutes of Exercise per Session:   Stress:   . Feeling of Stress :   Social Connections:   . Frequency of Communication with Friends and Family:   . Frequency of Social Gatherings with Friends and Family:   . Attends Religious Services:   . Active Member of Clubs or Organizations:   . Attends Archivist Meetings:   Marland Kitchen Marital Status:    Additional Social History:                         Sleep: Poor  Appetite:  Fair  Current Medications: Current Facility-Administered Medications  Medication Dose Route Frequency Provider Last Rate Last Admin  .  ibuprofen (ADVIL) tablet 600 mg  600 mg Oral Q8H PRN Mordecai Maes, NP      . LORazepam (ATIVAN) tablet 0.5 mg  0.5 mg Oral BID Suella Broad, FNP      . risperiDONE (RISPERDAL M-TABS) disintegrating tablet 2 mg  2 mg Oral Q8H PRN Sharma Covert, MD       And  . LORazepam (ATIVAN) tablet 1 mg  1 mg Oral PRN Sharma Covert, MD       And  . ziprasidone (GEODON) injection 20 mg  20 mg Intramuscular PRN Sharma Covert, MD      . methimazole (TAPAZOLE) tablet 10 mg  10 mg Oral BID Sharma Covert, MD      . risperiDONE (RISPERDAL M-TABS) disintegrating tablet 1 mg  1 mg Oral Daily Sharma Covert, MD      . risperiDONE (RISPERDAL M-TABS) disintegrating tablet 2 mg  2 mg Oral QHS Sharma Covert, MD        Lab Results:  Results for orders placed or performed during the hospital encounter of 06/27/19 (from the past 48 hour(s))  Hemoglobin A1c     Status: None   Collection Time: 06/28/19  6:41 AM  Result Value Ref Range   Hgb A1c MFr Bld 5.0 4.8 - 5.6 %    Comment: (NOTE) Pre diabetes:          5.7%-6.4% Diabetes:              >6.4% Glycemic control for   <7.0% adults with diabetes    Mean Plasma Glucose 96.8 mg/dL    Comment: Performed at [facility], [place] [address] [place], [place] [address]  Lipid panel     Status: Abnormal   Collection Time: 06/28/19  6:41 AM  Result Value Ref Range   Cholesterol 165 0 - 200 mg/dL   Triglycerides 136 <150 mg/dL   HDL 35 (L) >40 mg/dL   Total CHOL/HDL Ratio 4.7 RATIO   VLDL 27 0 - 40 mg/dL   LDL Cholesterol 103 (H) 0 - 99 mg/dL    Comment:        Total Cholesterol/HDL:CHD Risk Coronary Heart Disease Risk Table                     Men   Women  1/2 Average Risk   3.4   3.3  Average Risk       5.0   4.4  2 X Average Risk   9.6   7.1  3 X Average Risk  23.4  11.0        Use the calculated Patient Ratio above and the CHD Risk Table to determine the patient's CHD Risk.        ATP III CLASSIFICATION  (LDL):  <100     mg/dL   Optimal  100-129  mg/dL   Near or Above                    Optimal  130-159  mg/dL   Borderline  160-189  mg/dL   High  >190     mg/dL   Very High Performed at [place] [address], [place], [place] [address]   Prolactin     Status: None   Collection Time: 06/28/19  6:41 AM  Result Value Ref Range   Prolactin 13.1 4.8 - 23.3 ng/mL    Comment: (NOTE) Performed At: [facility] [place], [place] [id number] Rush Farmer MD [id number]   TSH     Status: Abnormal   Collection Time: 06/28/19  6:41 AM  Result Value Ref Range   TSH <0.010 (L) 0.350 - 4.500 uIU/mL    Comment: Performed by a 3rd Generation assay with a functional sensitivity of <=0.01 uIU/mL. Performed at [facility], [place] [address], [place], [place] [address]   Glucose, capillary     Status: None   Collection Time: 06/28/19  5:54 PM  Result Value Ref Range   Glucose-Capillary 85 70 - 99 mg/dL    Comment: Glucose reference range applies only to samples taken after fasting for at least 8 hours.  CBC with Differential     Status: None   Collection Time: 06/28/19  8:45 PM  Result Value Ref Range   WBC 9.6 4.0 - 10.5 K/uL   RBC 4.73 3.87 - 5.11 MIL/uL   Hemoglobin 13.5 12.0 - 15.0 g/dL   HCT 40.1 36.0 - 46.0 %   MCV 84.8 80.0 - 100.0 fL   MCH 28.5 26.0 - 34.0 pg   MCHC 33.7 30.0 - 36.0 g/dL   RDW 12.1 11.5 - 15.5 %   Platelets 307 150 - 400 K/uL   nRBC 0.0 0.0 - 0.2 %   Neutrophils Relative % 65 %   Neutro Abs 6.2 1.7 - 7.7 K/uL   Lymphocytes Relative 27 %   Lymphs Abs 2.6 0.7 - 4.0 K/uL   Monocytes Relative 7 %   Monocytes Absolute 0.7 0.1 - 1.0 K/uL   Eosinophils Relative 1 %   Eosinophils Absolute 0.1 0.0 - 0.5 K/uL   Basophils Relative 0 %   Basophils Absolute 0.0 0.0 - 0.1 K/uL   Immature Granulocytes 0 %   Abs Immature Granulocytes 0.02 0.00 - 0.07 K/uL    Comment: Performed at [facility], [place] [address] [place],  [address]  Comprehensive metabolic panel     Status: Abnormal   Collection Time: 06/28/19  8:45 PM  Result Value Ref Range   Sodium 139 135 - 145 mmol/L   Potassium 3.9 3.5 - 5.1 mmol/L   Chloride 104 98 - 111 mmol/L   CO2 27 22 - 32 mmol/L   Glucose, Bld 99 70 - 99 mg/dL    Comment: Glucose reference range applies only to samples taken after fasting for at least 8 hours.   BUN 10 6 - 20 mg/dL   Creatinine, Ser 0.69 0.44 - 1.00 mg/dL   Calcium 8.6 (L) 8.9 - 10.3 mg/dL   Total  Protein 7.0 6.5 - 8.1 g/dL   Albumin 4.1 3.5 - 5.0 g/dL   AST 13 (L) 15 - 41 U/L   ALT 19 0 - 44 U/L   Alkaline Phosphatase 49 38 - 126 U/L   Total Bilirubin 0.5 0.3 - 1.2 mg/dL   GFR calc non Af Amer >60 >60 mL/min   GFR calc Af Amer >60 >60 mL/min   Anion gap 8 5 - 15    Comment: Performed at [facility], [place] [address] [place], [place] [address]  Ammonia     Status: Abnormal   Collection Time: 06/28/19  8:45 PM  Result Value Ref Range   Ammonia 43 (H) 9 - 35 umol/L    Comment: Performed at [facility], [place] [address] [place], [place] [address]  Urinalysis, Complete w Microscopic     Status: None   Collection Time: 06/28/19  9:30 PM  Result Value Ref Range   Color, Urine YELLOW YELLOW   APPearance CLEAR CLEAR   Specific Gravity, Urine 1.025 1.005 - 1.030   pH 6.0 5.0 - 8.0   Glucose, UA NEGATIVE NEGATIVE mg/dL   Hgb urine dipstick NEGATIVE NEGATIVE   Bilirubin Urine NEGATIVE NEGATIVE   Ketones, ur NEGATIVE NEGATIVE mg/dL   Protein, ur NEGATIVE NEGATIVE mg/dL   Nitrite NEGATIVE NEGATIVE   Leukocytes,Ua NEGATIVE NEGATIVE   RBC / HPF 0-5 0 - 5 RBC/hpf   WBC, UA 0-5 0 - 5 WBC/hpf   Bacteria, UA NONE SEEN NONE SEEN   Squamous Epithelial / LPF 0-5 0 - 5   Mucus PRESENT     Comment: Performed at [facility], [place] [address] [place], [place] [address]    Blood Alcohol level:  Lab Results   Component Value Date   ETH <10 06/26/2019   ETH <5 0000000    Metabolic Disorder Labs: Lab Results  Component Value Date   HGBA1C 5.0 06/28/2019   MPG 96.8 06/28/2019   MPG 97 10/18/2018   Lab Results  Component Value Date   PROLACTIN 13.1 06/28/2019   PROLACTIN 69.3 (H) 09/17/2016   Lab Results  Component Value Date   CHOL 165 06/28/2019   TRIG 136 06/28/2019   HDL 35 (L) 06/28/2019   CHOLHDL 4.7 06/28/2019   VLDL 27 06/28/2019   LDLCALC 103 (H) 06/28/2019   LDLCALC 56 12/11/2016    Physical Findings: AIMS: Facial and Oral Movements Muscles of Facial Expression: None, normal Lips and Perioral Area: None, normal Jaw: None, normal Tongue: None, normal,Extremity Movements Upper (arms, wrists, hands, fingers): None, normal Lower (legs, knees, ankles, toes): None, normal, Trunk Movements Neck, shoulders, hips: None, normal, Overall Severity Severity of abnormal movements (highest score from questions above): None, normal Incapacitation due to abnormal movements: None, normal Patient's awareness of abnormal movements (rate only patient's report): No Awareness, Dental Status Current problems with teeth and/or dentures?: No Does patient usually wear dentures?: No  CIWA:    COWS:     Musculoskeletal: Strength & Muscle Tone: within normal limits Gait & Station: normal Patient leans: N/A  Psychiatric Specialty Exam: Physical Exam  Nursing note and vitals reviewed. Constitutional: She is oriented to person, place, and time. She appears well-developed and well-nourished.  HENT:  Head: Normocephalic and atraumatic.  Respiratory: Effort normal.  Neurological: She is alert and oriented to person, place, and time.    Review of Systems  Blood pressure (!) 148/97, pulse 79, temperature 98.6 F (37 C), temperature source Oral,  resp. rate 20, last menstrual period 06/20/2019, SpO2 97 %.There is no height or weight on file to calculate BMI.  General Appearance:  Disheveled  Eye Contact:  Minimal  Speech:  Blocked  Volume:  Decreased  Mood:  Anxious, Depressed and Dysphoric  Affect:  Labile  Thought Process:  Goal Directed and Descriptions of Associations: Circumstantial  Orientation:  Negative  Thought Content:  Delusions and Hallucinations: Auditory  Suicidal Thoughts:  No  Homicidal Thoughts:  No  Memory:  Immediate;   Poor Recent;   Poor Remote;   Poor  Judgement:  Impaired  Insight:  Lacking  Psychomotor Activity:  Decreased  Concentration:  Concentration: Poor and Attention Span: Poor  Recall:  Poor  Fund of Knowledge:  Poor  Language:  Fair  Akathisia:  Negative  Handed:  Right  AIMS (if indicated):     Assets:  Desire for Improvement Resilience  ADL's:  Impaired  Cognition:  WNL  Sleep:  Number of Hours: 2.75     Treatment Plan Summary: Daily contact with patient to assess and evaluate symptoms and progress in treatment, Medication management and Plan : Patient is seen and examined.  Patient is a 44 year old female with the above-stated past psychiatric history who is seen in follow-up.   Diagnosis: #1 schizoaffective disorder; bipolar type, #2 hypertension, #3 hyperthyroidism  Patient is seen in follow-up.  She is a poor historian so much of what is going to be done is from her history as well as her current symptoms.  I am going to stop the Prozac for now in case it is eliciting effective symptoms without mood stability.  She had previously been on Risperdal, Lilia Pro start 1 mg p.o. daily and 2 mg p.o. nightly.  The lorazepam 0.5 mg p.o. twice daily can continue.  Her TSH is less than 0.001.  Review of the electronic medical record mention that he was scheduled for radioactive iodide in the past, but I cannot find any evidence of treatment.  She is also been previously treated with methimazole 10 mg twice daily.  Given the current situation and how it may be affecting her psychiatric situation I think it is necessary to restart  her methimazole.  We will put her on 10 mg p.o. twice daily.  I know this will mess up any possibility of the radioactive test at this time, but again I think controlling her symptoms in the short run is more important to her psychiatric condition.  I will also put in place the agitation protocol if necessary.  Her ammonia was mildly elevated on admission, but with normal albumin as well as liver function enzymes I suspect that that is an anomaly.  No other changes in her medicines at this point.  We will attempt to collect some collateral information.  1.  Stop Prozac 2.  Continue lorazepam 0.5 mg p.o. twice daily for anxiety. 3.  Start methimazole 10 mg p.o. twice daily for hyperthyroidism. 4.  Start Risperdal 1 mg p.o. daily and 2 mg p.o. nightly for schizoaffective disorder. 5.  Place agitation protocol. 6.  Disposition planning-in progress.  Sharma Covert, MD 06/29/2019, 12:17 PM

## 2019-06-29 NOTE — Progress Notes (Signed)
Recreation Therapy Notes  5.26.21 [phone]:  Pt was sitting up in bed with her head down.  LRT introduced self to patient to complete recreation therapy assessment.  Pt would not respond and appeared to be crying.  LRT will attempt to assess pt at a later date.     Victorino Sparrow, LRT/CTRS   Ria Comment, Suhayb Anzalone A 06/29/2019 2:28 PM

## 2019-06-29 NOTE — Progress Notes (Signed)
Recreation Therapy Notes  Date: 5.26.21 Time: 1115 Location: 500 Hall Dayroom  Group Topic:  Goal Setting  Goal Area(s) Addresses:  Patient will be able to identify four goals they wish to accomplish Patient will identify obstacles that would prevent reaching goals. Patient will identify what they need to achieve goals.  Intervention: Worksheet, pencils  Activity: Goal Planning.  Patients were to identify goals they want to accomplish in a week, month, year and five years.  Patients then identified obstacles they may face, what they will need to be successful and what they can start doing now to work towards goals.   Education:  Discharge Planning, Coping Skills, Goal Planning  Education Outcome: Acknowledges Education/In Group Clarification Provided/Needs Additional Education  Clinical Observations: Pt did not attend group.    Victorino Sparrow , LRT/CTRS         Victorino Sparrow A 06/29/2019 1:03 PM

## 2019-06-29 NOTE — Progress Notes (Signed)
Pt brought back from [facility] . Pt very lethargic , but able to be aroused with minimal sternal rub. Pt started crying and being loud and woke up her roommate and was very disruptive, where the roommate had to be placed in the quiet room. Pt appeared to be in no distress at this time , and was allowed to go back to sleep.

## 2019-06-29 NOTE — Tx Team (Signed)
Interdisciplinary Treatment and Diagnostic Plan Update  06/29/2019 Time of Session: 9:00am Veronica Perez MRN: [id number]  Principal Diagnosis: <principal problem not specified>  Secondary Diagnoses: Active Problems:   Bipolar disorder, current episode manic severe with psychotic features ([place])   Current Medications:  Current Facility-Administered Medications  Medication Dose Route Frequency Provider Last Rate Last Admin  . FLUoxetine (PROZAC) capsule 20 mg  20 mg Oral Daily Mordecai Maes, NP   20 mg at 06/28/19 0752  . ibuprofen (ADVIL) tablet 600 mg  600 mg Oral Q8H PRN Mordecai Maes, NP      . LORazepam (ATIVAN) tablet 0.5 mg  0.5 mg Oral BID Suella Broad, FNP       PTA Medications: Medications Prior to Admission  Medication Sig Dispense Refill Last Dose  . acetaminophen (TYLENOL) 500 MG tablet Take 1,000 mg by mouth every 6 (six) hours as needed for mild pain or moderate pain.     . famotidine (PEPCID) 20 MG tablet Take 1 tablet (20 mg total) by mouth 2 (two) times daily. (Patient not taking: Reported on 06/26/2019) 60 tablet 0   . LORazepam (ATIVAN) 1 MG tablet Take 1 mg by mouth 3 (three) times daily.  1     Patient Stressors: Marital or family conflict Medication change or noncompliance  Patient Strengths: Motivation for treatment/growth Physical Health  Treatment Modalities: Medication Management, Group therapy, Case management,  1 to 1 session with clinician, Psychoeducation, Recreational therapy.   Physician Treatment Plan for Primary Diagnosis: <principal problem not specified> Long Term Goal(s): Improvement in symptoms so as ready for discharge Improvement in symptoms so as ready for discharge   Short Term Goals: Ability to identify changes in lifestyle to reduce recurrence of condition will improve Ability to verbalize feelings will improve Ability to disclose and discuss suicidal ideas Ability to demonstrate self-control will improve Ability  to identify and develop effective coping behaviors will improve Ability to maintain clinical measurements within normal limits will improve Compliance with prescribed medications will improve Ability to identify triggers associated with substance abuse/mental health issues will improve  Medication Management: Evaluate patient's response, side effects, and tolerance of medication regimen.  Therapeutic Interventions: 1 to 1 sessions, Unit Group sessions and Medication administration.  Evaluation of Outcomes: Not Met  Physician Treatment Plan for Secondary Diagnosis: Active Problems:   Bipolar disorder, current episode manic severe with psychotic features ([place])  Long Term Goal(s): Improvement in symptoms so as ready for discharge Improvement in symptoms so as ready for discharge   Short Term Goals: Ability to identify changes in lifestyle to reduce recurrence of condition will improve Ability to verbalize feelings will improve Ability to disclose and discuss suicidal ideas Ability to demonstrate self-control will improve Ability to identify and develop effective coping behaviors will improve Ability to maintain clinical measurements within normal limits will improve Compliance with prescribed medications will improve Ability to identify triggers associated with substance abuse/mental health issues will improve     Medication Management: Evaluate patient's response, side effects, and tolerance of medication regimen.  Therapeutic Interventions: 1 to 1 sessions, Unit Group sessions and Medication administration.  Evaluation of Outcomes: Not Met   RN Treatment Plan for Primary Diagnosis: <principal problem not specified> Long Term Goal(s): Knowledge of disease and therapeutic regimen to maintain health will improve  Short Term Goals: Ability to verbalize feelings will improve and Compliance with prescribed medications will improve  Medication Management: RN will administer medications  as ordered by provider, will assess and evaluate  patient's response and provide education to patient for prescribed medication. RN will report any adverse and/or side effects to prescribing provider.  Therapeutic Interventions: 1 on 1 counseling sessions, Psychoeducation, Medication administration, Evaluate responses to treatment, Monitor vital signs and CBGs as ordered, Perform/monitor CIWA, COWS, AIMS and Fall Risk screenings as ordered, Perform wound care treatments as ordered.  Evaluation of Outcomes: Not Met   LCSW Treatment Plan for Primary Diagnosis: <principal problem not specified> Long Term Goal(s): Safe transition to appropriate next level of care at discharge, Engage patient in therapeutic group addressing interpersonal concerns.  Short Term Goals: Engage patient in aftercare planning with referrals and resources, Increase social support, Identify triggers associated with mental health/substance abuse issues and Increase skills for wellness and recovery  Therapeutic Interventions: Assess for all discharge needs, 1 to 1 time with Social worker, Explore available resources and support systems, Assess for adequacy in community support network, Educate family and significant other(s) on suicide prevention, Complete Psychosocial Assessment, Interpersonal group therapy.  Evaluation of Outcomes: Not Met   Progress in Treatment: Attending groups: No. Participating in groups: No. Taking medication as prescribed: Yes. Toleration medication: Yes. Family/Significant other contact made: No, will contact:  supports if consents are granted. Patient understands diagnosis: No. Discussing patient identified problems/goals with staff: No. Medical problems stabilized or resolved: No. Denies suicidal/homicidal ideation: Yes. Issues/concerns per patient self-inventory: No.  New problem(s) identified: No, Describe:  CSW continuing to assess.  New Short Term/Long Term Goal(s): medication  management for mood stabilization; elimination of SI thoughts; development of comprehensive mental wellness/sobriety plan.  Patient Goals: Invited to attend, declined.  Discharge Plan or Barriers: CSW assessing for appropriate referrals.  Reason for Continuation of Hospitalization: Anxiety Delusions  Depression Hallucinations Medical Issues Medication stabilization  Estimated Length of Stay: 5-7days  Attendees: Patient: Veronica Perez 06/29/2019 9:48 AM  Physician: Queen Blossom 06/29/2019 9:48 AM  Nursing:  06/29/2019 9:48 AM  RN Care Manager: 06/29/2019 9:48 AM  Social Worker: Stephanie Acre, Latanya Presser 06/29/2019 9:48 AM  Recreational Therapist:  06/29/2019 9:48 AM  Other:  06/29/2019 9:48 AM  Other:  06/29/2019 9:48 AM  Other: 06/29/2019 9:48 AM    Scribe for Treatment Team: Joellen Jersey, Shar[place] 06/29/2019 9:48 AM

## 2019-06-29 NOTE — Progress Notes (Signed)
Adult Psychoeducational Group Note  Date:  06/29/2019 Time:  10:48 PM  Group Topic/Focus:  Wrap-Up Group:   The focus of this group is to help patients review their daily goal of treatment and discuss progress on daily workbooks.  Participation Level:  Minimal  Participation Quality:  Appropriate  Affect:  Tearful  Cognitive:  Lacking  Insight: Appropriate  Engagement in Group:  Engaged  Modes of Intervention:  Education and Support  Additional Comments:  Patient attended and participated in group tonight. She reports not being able to sleep.   Salley Scarlet [facility] 06/29/2019, 10:48 PM

## 2019-06-29 NOTE — [facility] Counselor (Signed)
CSW attempted to meet with patient to complete assessment and discuss discharge planning. Patient found lying in bed, did not respond to CSW knocking at the door or entering room and addressing patient by name.  This was the third attempt to complete PSA.  Stephanie Acre, MSW, LCSW-A Clinical Social Worker [facility]

## 2019-06-29 NOTE — Progress Notes (Signed)
Pt visible on the unit some this evening. Pt stated she would have a hard time sleeping without medication. Pt visibly anxious / agitated. Pt given PRN Risperdal per [facility].     06/29/19 2100  Psych Admission Type (Psych Patients Only)  Admission Status Voluntary  Psychosocial Assessment  Patient Complaints Anxiety;Agitation  Eye Contact Other (Comment);Avoids (eyes closed)  Facial Expression Anxious  Affect Irritable  Speech Argumentative  Interaction Isolative  Motor Activity Lethargic  Appearance/Hygiene Disheveled  Behavior Characteristics Anxious;Agitated;Irritable  Mood Anxious;Depressed  Thought Community education officer  Content Preoccupation  Delusions Paranoid  Perception Derealization  Hallucination None reported or observed  Judgment Impaired  Confusion UTA  Danger to Self  Current suicidal ideation? Denies  Danger to Others  Danger to Others None reported or observed

## 2019-06-30 MED ORDER — HALOPERIDOL LACTATE 5 MG/ML IJ SOLN
10.0000 mg | Freq: Every day | INTRAMUSCULAR | Status: DC
  Filled 2019-06-30 (×2): qty 2

## 2019-06-30 MED ORDER — HALOPERIDOL LACTATE 5 MG/ML IJ SOLN
5.0000 mg | Freq: Every day | INTRAMUSCULAR | Status: DC
  Filled 2019-06-30 (×2): qty 1

## 2019-06-30 MED ORDER — FAMOTIDINE 20 MG PO TABS
20.0000 mg | ORAL_TABLET | Freq: Two times a day (BID) | ORAL | Status: DC
  Administered 2019-06-30 – 2019-07-03 (×5): 20 mg via ORAL
  Filled 2019-06-30 (×10): qty 1

## 2019-06-30 MED ORDER — HALOPERIDOL 5 MG PO TABS
5.0000 mg | ORAL_TABLET | Freq: Every day | ORAL | Status: DC
  Administered 2019-07-01: 5 mg via ORAL
  Filled 2019-06-30 (×2): qty 1

## 2019-06-30 MED ORDER — HALOPERIDOL 5 MG PO TABS
10.0000 mg | ORAL_TABLET | Freq: Every day | ORAL | Status: DC
  Administered 2019-06-30: 10 mg via ORAL
  Filled 2019-06-30 (×2): qty 2

## 2019-06-30 NOTE — Progress Notes (Signed)
Psychoeducational Group Note  Date:  06/30/2019 Time:  2050  Group Topic/Focus:  Wrap-Up Group:   The focus of this group is to help patients review their daily goal of treatment and discuss progress on daily workbooks.  Participation Level: Did Not Attend  Participation Quality:  Not Applicable  Affect:  Not Applicable  Cognitive:  Not Applicable  Insight:  Not Applicable  Engagement in Group: Not Applicable  Additional Comments:  The patient did not attend group this evening.   Archie Balboa S 06/30/2019, 8:50 PM

## 2019-06-30 NOTE — BHH Counselor (Signed)
Adult Comprehensive Assessment  Patient ID: Veronica Perez, female   DOB: 10-Jul-1975, 44 y.o.   MRN: [id number]  Information Source: Patient  Current Stressors:  Educational / Learning stressors: Denies Employment / Job issues: Disability Family Relationships: Lives independently with daughter Museum/gallery curator / Lack of resources (include bankruptcy): Receives disability income  Housing / Lack of housing: Section 8 housing, states she [place] highway and it is very loud  Physical health (include injuries & life threatening diseases): Denies Social relationships: Lack of social relationship except for family  Substance abuse: Denies Bereavement / Loss: Denies  Living/Environment/Situation:  Living Arrangements: Independent  Living conditions (as described by patient or guardian): Good. Has a section 8 voucher. How long has patient lived in current situation?: 2-3 months What is atmosphere in current home: Chaotic due to too much noise in the area  Family History:  Marital status: Single Does patient have children?: Yes How many children?: 1 How is patient's relationship with their children?: Daughter 17 years - "pretty good"  Childhood History:  By whom was/is the patient raised?: Both parents Description of patient's relationship with caregiver when they were a child: Did not want disclose.  Patient's description of current relationship with people who raised him/her: "We have a good relationship. I miss my mother."  Does patient have siblings?: Yes Number of Siblings: 2 Description of patient's current relationship with siblings: 1 brother and 1 sister - "That's one thing I don't want to talk about is my sister. She's died." Relationship with brother is fine "We look out for one another."  Did patient suffer any verbal/emotional/physical/sexual abuse as a child?: No Did patient suffer from severe childhood neglect?: No Has patient ever been sexually  abused/assaulted/raped as an adolescent or adult?: (Did not want to disclose. Pt became very agitated. ) Was the patient ever a victim of a crime or a disaster?: (Did not want to disclose. ) Witnessed domestic violence?: (Did not want to disclose) Has patient been effected by domestic violence as an adult?: (Did not want to disclose. )  Education:  Highest grade of school patient has completed: "I don't remember."  Currently a student?: No Learning disability?: Yes What learning problems does patient have?: "There's something there, but I'm not too sure."   Employment/Work Situation:  Employment situation: On disability Why is patient on disability: Bipolar and depression disorders  How long has patient been on disability: "I can't remember, it's been so long."  Patient's job has been impacted by current illness: No What is the longest time patient has a held a job?: "I don't remember."  Where was the patient employed at that time?: "I don't remember."  Has patient ever been in the [employer]?: No Has patient ever served in combat?: No  Financial Resources:  Museum/gallery curator resources: [employer];Food stamps Does patient have a representative payee or guardian?: No Name of representative payee or guardian:  Alcohol/Substance Abuse:  What has been your use of drugs/alcohol within the last 12 months?:Denies If attempted suicide, did drugs/alcohol play a role in this?: No Alcohol/Substance Abuse Treatment Hx: Denies past history Has alcohol/substance abuse ever caused legal problems?: No  Social Support System: Patient's Community Support System: Good Describe Community Support System: Family  Type of faith/religion: Darrick Meigs  How does patient's faith help to cope with current illness?: Did not want to disclose   Leisure/Recreation:  Leisure and Hobbies: Being with daughter and family   Strengths/Needs:  What things does the  patient do well?: "  I just need to sleep"  In what areas does patient struggle / problems for patient: "I haven't been able to sleep"  Discharge Plan:  Does patient have access to transportation?: Unknown Will patient be returning to same living situation after discharge?: Yes Currently receiving community mental health services: Unable to disclose Does patient have financial barriers related to discharge medications?: No  Summary/Recommendations:   Summary and Recommendations (to be completed by the evaluator):  Veronica Perez is a 44 year old female with a past psychiatric history significant for bipolar disorder, panic disorder, hyperthyroidism who was admitted due todebility, tearfulness, paranoia. During the assessment patient was observed to be drowsy, struggled to open her eyes and only mumbled answers to questions or declined to disclose information.She stated that she slept better last night, and one of the reasons why she was admitted was because she was not sleeping.While here, Veronica Perez can benefit from crisis stabilization, medication management, therapeutic milieu, and referrals for services

## 2019-06-30 NOTE — Plan of Care (Signed)
Progress note  D: pt found in bed; compliant with medication administration. Pt denies any physical complaints or pain. Pt continues to be lethargic and have elective mutism on approach. Pt is pleasant but minimal. Pt has rested most of the day. Pt denies si/hi/ah/vh and verbally agrees to approach staff if these become apparent or before harming themself/others while at [facility].  A: Pt provided support and encouragement. Pt given medication per protocol and standing orders. Q50m safety checks implemented and continued.  R: Pt safe on the unit. Will continue to monitor.  Pt progressing in the following metrics  Problem: Activity: Goal: Sleeping patterns will improve Outcome: Progressing   Problem: Coping: Goal: Ability to demonstrate self-control will improve Outcome: Progressing   Problem: Health Behavior/Discharge Planning: Goal: Compliance with treatment plan for underlying cause of condition will improve Outcome: Progressing   Problem: Physical Regulation: Goal: Ability to maintain clinical measurements within normal limits will improve Outcome: Progressing

## 2019-06-30 NOTE — Progress Notes (Signed)
   06/30/19 2200  Psych Admission Type (Psych Patients Only)  Admission Status Voluntary  Psychosocial Assessment  Patient Complaints Anxiety  Eye Contact Avoids;Brief  Facial Expression Anxious  Affect Anxious  Speech Slurred  Interaction Avoidant;Cautious;Forwards little;Guarded;Isolative;Minimal  Motor Activity Lethargic;Slow  Appearance/Hygiene Disheveled  Behavior Characteristics Cooperative  Mood Anxious  Thought Process  Coherency Incoherent  Content Preoccupation  Delusions Paranoid  Perception Derealization  Hallucination None reported or observed  Judgment Impaired  Confusion Mild  Danger to Self  Current suicidal ideation? Denies  Danger to Others  Danger to Others None reported or observed

## 2019-06-30 NOTE — Progress Notes (Signed)
[facility] MD Progress Note  06/30/2019 10:57 AM Veronica Perez  MRN:  [id number] Subjective:  Patient is a 44 year old female with a past psychiatric history significant for bipolar disorder, panic disorder, hyperthyroidism who presented to the [facility] emergency department on 06/27/2019 secondary to debility, tearfulness, paranoia.  Objective: Patient is seen and examined.  Patient is a 44 year old female with the above-stated past psychiatric history who is seen in follow-up.  I was able to get her out of bed today.  She was not tearful.  She stated she felt better.  She denied any auditory or visual hallucinations.  She stated that she slept better last night, and one of the reasons why she was admitted was because she was not sleeping.  She denied any noncompliance with medications at home.  Her examination is not great today secondary to her mild sedation, but at least she makes eye contact and speaks today.  Her blood pressure stable at 113/75.  She is afebrile.  She slept 6.75 h last night.  When asked about the need for hospitalization she does not recall any of the issues that her family made prior to admission.  Principal Problem: <principal problem not specified> Diagnosis: Active Problems:   Bipolar disorder, current episode manic severe with psychotic features ([place])  Total Time spent with patient: 20 minutes  Past Psychiatric History: See admission H&P  Past Medical History:  Past Medical History:  Diagnosis Date  . Bipolar disorder ([place])   . Depression   . HSV (herpes simplex virus) infection   . Panic attacks   . Thyroid disease     Past Surgical History:  Procedure Laterality Date  . ADENOIDECTOMY    . OVARIAN CYST SURGERY    . TONSILLECTOMY     Family History:  Family History  Problem Relation Age of Onset  . Diabetes Father   . Heart disease Father   . Cancer Maternal Grandmother        breast cancer  . Bipolar disorder Mother   . Thyroid disease  Neg Hx    Family Psychiatric  History: See admission H&P Social History:  Social History   Substance and Sexual Activity  Alcohol Use Not Currently   Comment: consumes less than 4x year     Social History   Substance and Sexual Activity  Drug Use No    Social History   Socioeconomic History  . Marital status: Single    Spouse name: Not on file  . Number of children: Not on file  . Years of education: Not on file  . Highest education level: High school graduate  Occupational History  . Not on file  Tobacco Use  . Smoking status: Former Smoker    Quit date: 01/18/2012    Years since quitting: 7.4  . Smokeless tobacco: Never Used  Substance and Sexual Activity  . Alcohol use: Not Currently    Comment: consumes less than 4x year  . Drug use: No  . Sexual activity: Yes    Birth control/protection: None  Other Topics Concern  . Not on file  Social History Narrative  . Not on file   Social Determinants of Health   Financial Resource Strain:   . Difficulty of Paying Living Expenses:   Food Insecurity:   . Worried About Charity fundraiser in the Last Year:   . Arboriculturist in the Last Year:   Transportation Needs:   . Film/video editor (Medical):   Marland Kitchen  Lack of Transportation (Non-Medical):   Physical Activity:   . Days of Exercise per Week:   . Minutes of Exercise per Session:   Stress:   . Feeling of Stress :   Social Connections:   . Frequency of Communication with Friends and Family:   . Frequency of Social Gatherings with Friends and Family:   . Attends Religious Services:   . Active Member of Clubs or Organizations:   . Attends Archivist Meetings:   Marland Kitchen Marital Status:    Additional Social History:                         Sleep: Good  Appetite:  Fair  Current Medications: Current Facility-Administered Medications  Medication Dose Route Frequency Provider Last Rate Last Admin  . haloperidol (HALDOL) tablet 10 mg  10 mg  Oral BID Sharma Covert, MD   10 mg at 06/30/19 [id number]   Or  . haloperidol lactate (HALDOL) injection 10 mg  10 mg Intramuscular BID Sharma Covert, MD   10 mg at 06/29/19 1739  . ibuprofen (ADVIL) tablet 600 mg  600 mg Oral Q8H PRN Mordecai Maes, NP      . LORazepam (ATIVAN) tablet 0.5 mg  0.5 mg Oral BID Suella Broad, FNP   0.5 mg at 06/30/19 0753  . risperiDONE (RISPERDAL M-TABS) disintegrating tablet 2 mg  2 mg Oral Q8H PRN Sharma Covert, MD   2 mg at 06/29/19 2054   And  . LORazepam (ATIVAN) tablet 1 mg  1 mg Oral PRN Sharma Covert, MD       And  . ziprasidone (GEODON) injection 20 mg  20 mg Intramuscular PRN Sharma Covert, MD      . methimazole (TAPAZOLE) tablet 10 mg  10 mg Oral BID Sharma Covert, MD   10 mg at 06/30/19 [id number]    Lab Results:  Results for orders placed or performed during the hospital encounter of 06/27/19 (from the past 48 hour(s))  Glucose, capillary     Status: None   Collection Time: 06/28/19  5:54 PM  Result Value Ref Range   Glucose-Capillary 85 70 - 99 mg/dL    Comment: Glucose reference range applies only to samples taken after fasting for at least 8 hours.  CBC with Differential     Status: None   Collection Time: 06/28/19  8:45 PM  Result Value Ref Range   WBC 9.6 4.0 - 10.5 K/uL   RBC 4.73 3.87 - 5.11 MIL/uL   Hemoglobin 13.5 12.0 - 15.0 g/dL   HCT 40.1 36.0 - 46.0 %   MCV 84.8 80.0 - 100.0 fL   MCH 28.5 26.0 - 34.0 pg   MCHC 33.7 30.0 - 36.0 g/dL   RDW 12.1 11.5 - 15.5 %   Platelets 307 150 - 400 K/uL   nRBC 0.0 0.0 - 0.2 %   Neutrophils Relative % 65 %   Neutro Abs 6.2 1.7 - 7.7 K/uL   Lymphocytes Relative 27 %   Lymphs Abs 2.6 0.7 - 4.0 K/uL   Monocytes Relative 7 %   Monocytes Absolute 0.7 0.1 - 1.0 K/uL   Eosinophils Relative 1 %   Eosinophils Absolute 0.1 0.0 - 0.5 K/uL   Basophils Relative 0 %   Basophils Absolute 0.0 0.0 - 0.1 K/uL   Immature Granulocytes 0 %   Abs Immature Granulocytes 0.02  0.00 - 0.07 K/uL    Comment:  Performed at [facility], [place] [address], [place], [place] [address]  Comprehensive metabolic panel     Status: Abnormal   Collection Time: 06/28/19  8:45 PM  Result Value Ref Range   Sodium 139 135 - 145 mmol/L   Potassium 3.9 3.5 - 5.1 mmol/L   Chloride 104 98 - 111 mmol/L   CO2 27 22 - 32 mmol/L   Glucose, Bld 99 70 - 99 mg/dL    Comment: Glucose reference range applies only to samples taken after fasting for at least 8 hours.   BUN 10 6 - 20 mg/dL   Creatinine, Ser 0.69 0.44 - 1.00 mg/dL   Calcium 8.6 (L) 8.9 - 10.3 mg/dL   Total Protein 7.0 6.5 - 8.1 g/dL   Albumin 4.1 3.5 - 5.0 g/dL   AST 13 (L) 15 - 41 U/L   ALT 19 0 - 44 U/L   Alkaline Phosphatase 49 38 - 126 U/L   Total Bilirubin 0.5 0.3 - 1.2 mg/dL   GFR calc non Af Amer >60 >60 mL/min   GFR calc Af Amer >60 >60 mL/min   Anion gap 8 5 - 15    Comment: Performed at [facility], [place] [address] [place], [place] [address]  Ammonia     Status: Abnormal   Collection Time: 06/28/19  8:45 PM  Result Value Ref Range   Ammonia 43 (H) 9 - 35 umol/L    Comment: Performed at [facility], [place] [address], [place], [place] [address]  Urinalysis, Complete w Microscopic     Status: None   Collection Time: 06/28/19  9:30 PM  Result Value Ref Range   Color, Urine YELLOW YELLOW   APPearance CLEAR CLEAR   Specific Gravity, Urine 1.025 1.005 - 1.030   pH 6.0 5.0 - 8.0   Glucose, UA NEGATIVE NEGATIVE mg/dL   Hgb urine dipstick NEGATIVE NEGATIVE   Bilirubin Urine NEGATIVE NEGATIVE   Ketones, ur NEGATIVE NEGATIVE mg/dL   Protein, ur NEGATIVE NEGATIVE mg/dL   Nitrite NEGATIVE NEGATIVE   Leukocytes,Ua NEGATIVE NEGATIVE   RBC / HPF 0-5 0 - 5 RBC/hpf   WBC, UA 0-5 0 - 5 WBC/hpf   Bacteria, UA NONE SEEN NONE SEEN   Squamous Epithelial / LPF 0-5 0 - 5   Mucus PRESENT     Comment: Performed at [facility], [place] [address],  [place], [place] [address]    Blood Alcohol level:  Lab Results  Component Value Date   ETH <10 06/26/2019   ETH <5 0000000    Metabolic Disorder Labs: Lab Results  Component Value Date   HGBA1C 5.0 06/28/2019   MPG 96.8 06/28/2019   MPG 97 10/18/2018   Lab Results  Component Value Date   PROLACTIN 13.1 06/28/2019   PROLACTIN 69.3 (H) 09/17/2016   Lab Results  Component Value Date   CHOL 165 06/28/2019   TRIG 136 06/28/2019   HDL 35 (L) 06/28/2019   CHOLHDL 4.7 06/28/2019   VLDL 27 06/28/2019   LDLCALC 103 (H) 06/28/2019   LDLCALC 56 12/11/2016    Physical Findings: AIMS: Facial and Oral Movements Muscles of Facial Expression: None, normal Lips and Perioral Area: None, normal Jaw: None, normal Tongue: None, normal,Extremity Movements Upper (arms, wrists, hands, fingers): None, normal Lower (legs, knees, ankles, toes): None, normal, Trunk Movements Neck, shoulders, hips: None, normal, Overall Severity Severity of abnormal movements (highest score from questions above): None, normal Incapacitation due to  abnormal movements: None, normal Patient's awareness of abnormal movements (rate only patient's report): No Awareness, Dental Status Current problems with teeth and/or dentures?: No Does patient usually wear dentures?: No  CIWA:    COWS:     Musculoskeletal: Strength & Muscle Tone: within normal limits Gait & Station: shuffle Patient leans: N/A  Psychiatric Specialty Exam: Physical Exam  Nursing note and vitals reviewed. Constitutional: She is oriented to person, place, and time. She appears well-developed and well-nourished.  HENT:  Head: Normocephalic and atraumatic.  Respiratory: Effort normal.  Neurological: She is alert and oriented to person, place, and time.    Review of Systems  Blood pressure (!) 121/91, pulse (!) 107, temperature 98.3 F (36.8 C), temperature source Oral, resp. rate 20, last menstrual period 06/20/2019, SpO2 95 %.There is no  height or weight on file to calculate BMI.  General Appearance: Disheveled  Eye Contact:  Fair  Speech:  Normal Rate  Volume:  Decreased  Mood:  Dysphoric  Affect:  Blunt  Thought Process:  Goal Directed and Descriptions of Associations: Circumstantial  Orientation:  Full (Time, Place, and Person)  Thought Content:  Logical  Suicidal Thoughts:  No  Homicidal Thoughts:  No  Memory:  Immediate;   Poor Recent;   Poor Remote;   Poor  Judgement:  Impaired  Insight:  Fair  Psychomotor Activity:  Decreased  Concentration:  Concentration: Fair and Attention Span: Fair  Recall:  [employer] of Knowledge:  Fair  Language:  Fair  Akathisia:  Negative  Handed:  Right  AIMS (if indicated):     Assets:  Desire for Improvement Resilience  ADL's:  Impaired  Cognition:  WNL  Sleep:  Number of Hours: 6.75     Treatment Plan Summary: Daily contact with patient to assess and evaluate symptoms and progress in treatment, Medication management and Plan : Patient is seen and examined.  Patient is a 44 year old female with the above-stated past psychiatric history who is seen in follow-up.   Diagnosis: 1.  Bipolar disorder, most recently mixed with psychotic features 2.  Hyperthyroidism 3.  Chronic pain 4.  Dyspepsia 5.  GERD  Findings on examination today: 1.  Decrease in psychotic symptoms. 2.  Improved sleep. 3.  Denies suicidal or homicidal ideation.  Plan: 1.  Change haloperidol to 5 mg p.o. daily and 10 mg p.o. nightly either p.o. or IM for psychosis and mood stability. 2.  Continue ibuprofen 600 mg p.o. every 8 hours as needed pain. 3.  Continue lorazepam 0.5 mg p.o. twice daily for anxiety. 4.  Continue methimazole 10 mg p.o. twice daily for hyperthyroidism. 5.  Continue agitation protocol as needed 6.  If patient improves with continued Haldol will attempt to go to peer p.o. medications tomorrow. 7.  Add Pepcid 20 mg p.o. twice daily for dyspepsia. 8.  Disposition planning-in  progress.  Sharma Covert, MD 06/30/2019, 10:57 AM

## 2019-06-30 NOTE — Progress Notes (Signed)
Recreation Therapy Notes  Date: 5.27.21 Time: 1000 Location: 500 [address] Dayroom  Group Topic: Anxiety  Goal Area(s) Addresses:  Patient will identify triggers to anxiety. Patient will identify physical symptoms when anxious. Patient will identify coping skills for anxiety.  Intervention: Worksheet, pencils  Activity: Introduction to Anxiety.  Patients were to identify triggers to anxiety, physical symptoms, thoughts they have and coping skills for being anxious.  Education: Communication, Discharge Planning  Education Outcome: Acknowledges understanding/In group clarification offered/Needs additional education.   Clinical Observations/Feedback: Pt did not attend group session.    Victorino Sparrow, LRT/CTRS    Victorino Sparrow A 06/30/2019 11:27 AM

## 2019-07-01 MED ORDER — HALOPERIDOL LACTATE 5 MG/ML IJ SOLN: 5.0000 mg | Freq: Every day | INTRAMUSCULAR | Status: DC | PRN

## 2019-07-01 MED ORDER — DIPHENHYDRAMINE HCL 50 MG PO CAPS
50.0000 mg | ORAL_CAPSULE | Freq: Once | ORAL | Status: AC
  Administered 2019-07-01: 50 mg via ORAL

## 2019-07-01 MED ORDER — HALOPERIDOL 5 MG PO TABS: 5.0000 mg | ORAL_TABLET | Freq: Every day | ORAL | Status: DC | PRN

## 2019-07-01 MED ORDER — HALOPERIDOL LACTATE 5 MG/ML IJ SOLN: 10.0000 mg | Freq: Every evening | INTRAMUSCULAR | Status: DC | PRN

## 2019-07-01 MED ORDER — ONDANSETRON HCL 4 MG PO TABS: 8.0000 mg | ORAL_TABLET | Freq: Three times a day (TID) | ORAL | Status: DC | PRN

## 2019-07-01 MED ORDER — HALOPERIDOL 5 MG PO TABS: 10.0000 mg | ORAL_TABLET | Freq: Every evening | ORAL | Status: DC | PRN

## 2019-07-01 MED ORDER — RISPERIDONE 1 MG PO TBDP
1.0000 mg | ORAL_TABLET | ORAL | Status: AC
  Administered 2019-07-01: 1 mg via ORAL
  Filled 2019-07-01 (×2): qty 1

## 2019-07-01 MED ORDER — DIPHENHYDRAMINE HCL 25 MG PO CAPS
ORAL_CAPSULE | ORAL | Status: AC
  Filled 2019-07-01: qty 2

## 2019-07-01 MED ORDER — DIPHENHYDRAMINE HCL 25 MG PO CAPS
25.0000 mg | ORAL_CAPSULE | Freq: Four times a day (QID) | ORAL | Status: DC | PRN
  Administered 2019-07-02 (×3): 25 mg via ORAL
  Filled 2019-07-01 (×3): qty 1

## 2019-07-01 NOTE — Progress Notes (Signed)
   07/01/19 0900  Psych Admission Type (Psych Patients Only)  Admission Status Voluntary  Psychosocial Assessment  Patient Complaints Anxiety  Eye Contact Avoids;Brief  Facial Expression Anxious  Affect Anxious  Speech Slurred  Interaction Avoidant;Cautious;Forwards little;Guarded;Isolative;Minimal  Motor Activity Lethargic;Slow  Appearance/Hygiene Disheveled  Behavior Characteristics Cooperative  Mood Preoccupied  Thought Process  Coherency Incoherent  Content Preoccupation  Delusions Paranoid  Perception Derealization  Hallucination None reported or observed  Judgment Impaired  Confusion Mild  Danger to Self  Current suicidal ideation? Denies  Danger to Others  Danger to Others None reported or observed

## 2019-07-01 NOTE — Progress Notes (Signed)
[facility] MD Progress Note  07/01/2019 11:13 AM Veronica Perez  MRN:  [id number] Subjective:  Patient is a 44 year old female with a past psychiatric history significant for bipolar disorder, panic disorder, hyperthyroidism who presented to the [facility] emergency department on 06/27/2019 secondary to debility, tearfulness, paranoia.  Objective: Patient is seen and examined.  Patient is a 44 year old female with the above-stated past psychiatric history is seen in follow-up.  She was out of bed standing today, and came in for examination.  Reviewed the nursing notes from last night showed that she had spit out her Haldol.  She is speaking today, but remains significantly paranoid.  He is focused on her tongue, and "side effects".  On physical examination she is not having any cogwheeling today.  I think that some of her physical symptoms she is reporting are somatic in origin.  We discussed returning to Risperdal which she had been previously prescribed, but I warned her that if she refused the Risperdal we would go on and give her the Haldol as we have.  As stated above, she remains isolated, paranoid, and clearly psychotic.  Her vital signs are stable, she is afebrile.  She slept 6.25 hours last night.  No new laboratories.  Principal Problem: <principal problem not specified> Diagnosis: Active Problems:   Bipolar disorder, current episode manic severe with psychotic features ([place])  Total Time spent with patient: 20 minutes  Past Psychiatric History: See admission H&P  Past Medical History:  Past Medical History:  Diagnosis Date  . Bipolar disorder ([place])   . Depression   . HSV (herpes simplex virus) infection   . Panic attacks   . Thyroid disease     Past Surgical History:  Procedure Laterality Date  . ADENOIDECTOMY    . OVARIAN CYST SURGERY    . TONSILLECTOMY     Family History:  Family History  Problem Relation Age of Onset  . Diabetes Father   . Heart disease  Father   . Cancer Maternal Grandmother        breast cancer  . Bipolar disorder Mother   . Thyroid disease Neg Hx    Family Psychiatric  History: See admission H&P Social History:  Social History   Substance and Sexual Activity  Alcohol Use Not Currently   Comment: consumes less than 4x year     Social History   Substance and Sexual Activity  Drug Use No    Social History   Socioeconomic History  . Marital status: Single    Spouse name: Not on file  . Number of children: Not on file  . Years of education: Not on file  . Highest education level: High school graduate  Occupational History  . Not on file  Tobacco Use  . Smoking status: Former Smoker    Quit date: 01/18/2012    Years since quitting: 7.4  . Smokeless tobacco: Never Used  Substance and Sexual Activity  . Alcohol use: Not Currently    Comment: consumes less than 4x year  . Drug use: No  . Sexual activity: Yes    Birth control/protection: None  Other Topics Concern  . Not on file  Social History Narrative  . Not on file   Social Determinants of Health   Financial Resource Strain:   . Difficulty of Paying Living Expenses:   Food Insecurity:   . Worried About Charity fundraiser in the Last Year:   . [place] in the Last  Year:   Transportation Needs:   . Film/video editor (Medical):   Marland Kitchen Lack of Transportation (Non-Medical):   Physical Activity:   . Days of Exercise per Week:   . Minutes of Exercise per Session:   Stress:   . Feeling of Stress :   Social Connections:   . Frequency of Communication with Friends and Family:   . Frequency of Social Gatherings with Friends and Family:   . Attends Religious Services:   . Active Member of Clubs or Organizations:   . Attends Archivist Meetings:   Marland Kitchen Marital Status:    Additional Social History:                         Sleep: Fair  Appetite:  Fair  Current Medications: Current Facility-Administered Medications   Medication Dose Route Frequency Provider Last Rate Last Admin  . famotidine (PEPCID) tablet 20 mg  20 mg Oral BID Sharma Covert, MD   20 mg at 07/01/19 0751  . haloperidol (HALDOL) tablet 10 mg  10 mg Oral QHS PRN Sharma Covert, MD       Or  . haloperidol lactate (HALDOL) injection 10 mg  10 mg Intramuscular QHS PRN Sharma Covert, MD      . haloperidol (HALDOL) tablet 5 mg  5 mg Oral Daily PRN Sharma Covert, MD       Or  . haloperidol lactate (HALDOL) injection 5 mg  5 mg Intramuscular Daily PRN Sharma Covert, MD      . ibuprofen (ADVIL) tablet 600 mg  600 mg Oral Q8H PRN Mordecai Maes, NP      . LORazepam (ATIVAN) tablet 0.5 mg  0.5 mg Oral BID Suella Broad, FNP   0.5 mg at 07/01/19 0751  . risperiDONE (RISPERDAL M-TABS) disintegrating tablet 2 mg  2 mg Oral Q8H PRN Sharma Covert, MD   2 mg at 06/29/19 2054   And  . LORazepam (ATIVAN) tablet 1 mg  1 mg Oral PRN Sharma Covert, MD       And  . ziprasidone (GEODON) injection 20 mg  20 mg Intramuscular PRN Sharma Covert, MD      . methimazole (TAPAZOLE) tablet 10 mg  10 mg Oral BID Sharma Covert, MD   10 mg at 06/30/19 0752  . ondansetron (ZOFRAN) tablet 8 mg  8 mg Oral Q8H PRN Sharma Covert, MD        Lab Results: No results found for this or any previous visit (from the past 48 hour(s)).  Blood Alcohol level:  Lab Results  Component Value Date   ETH <10 06/26/2019   ETH <5 0000000    Metabolic Disorder Labs: Lab Results  Component Value Date   HGBA1C 5.0 06/28/2019   MPG 96.8 06/28/2019   MPG 97 10/18/2018   Lab Results  Component Value Date   PROLACTIN 13.1 06/28/2019   PROLACTIN 69.3 (H) 09/17/2016   Lab Results  Component Value Date   CHOL 165 06/28/2019   TRIG 136 06/28/2019   HDL 35 (L) 06/28/2019   CHOLHDL 4.7 06/28/2019   VLDL 27 06/28/2019   LDLCALC 103 (H) 06/28/2019   LDLCALC 56 12/11/2016    Physical Findings: AIMS: Facial and Oral  Movements Muscles of Facial Expression: None, normal Lips and Perioral Area: None, normal Jaw: None, normal Tongue: None, normal,Extremity Movements Upper (arms, wrists, hands, fingers): None, normal Lower (legs, knees,  ankles, toes): None, normal, Trunk Movements Neck, shoulders, hips: None, normal, Overall Severity Severity of abnormal movements (highest score from questions above): None, normal Incapacitation due to abnormal movements: None, normal Patient's awareness of abnormal movements (rate only patient's report): No Awareness, Dental Status Current problems with teeth and/or dentures?: No Does patient usually wear dentures?: No  CIWA:    COWS:     Musculoskeletal: Strength & Muscle Tone: within normal limits Gait & Station: normal Patient leans: N/A  Psychiatric Specialty Exam: Physical Exam  Nursing note and vitals reviewed. Constitutional: She is oriented to person, place, and time. She appears well-developed and well-nourished.  HENT:  Head: Normocephalic and atraumatic.  Respiratory: Effort normal.  Neurological: She is alert and oriented to person, place, and time.    Review of Systems  Blood pressure 116/88, pulse (!) 106, temperature 98.8 F (37.1 C), temperature source Oral, resp. rate 20, last menstrual period 06/20/2019, SpO2 95 %.There is no height or weight on file to calculate BMI.  General Appearance: Disheveled  Eye Contact:  Fair  Speech:  Blocked  Volume:  Decreased  Mood:  Anxious and Dysphoric  Affect:  Flat  Thought Process:  Disorganized and Descriptions of Associations: Loose  Orientation:  Negative  Thought Content:  Delusions, Hallucinations: Auditory and Paranoid Ideation  Suicidal Thoughts:  No  Homicidal Thoughts:  No  Memory:  Immediate;   Poor Recent;   Poor Remote;   Poor  Judgement:  Impaired  Insight:  Lacking  Psychomotor Activity:  Increased  Concentration:  Concentration: Fair and Attention Span: Fair  Recall:  [employer]  of Knowledge:  Poor  Language:  Fair  Akathisia:  Negative  Handed:  Right  AIMS (if indicated):     Assets:  Desire for Improvement Resilience  ADL's:  Impaired  Cognition:  WNL  Sleep:  Number of Hours: 6.75     Treatment Plan Summary: Daily contact with patient to assess and evaluate symptoms and progress in treatment, Medication management and Plan : Patient is seen and examined.  Patient is a 44 year old female with the above-stated past psychiatric history who is seen in follow-up.  Diagnosis: 1.  Bipolar disorder, most recently mixed with psychotic features 2.  Hyperthyroidism 3.  Chronic pain 4.  Dyspepsia 5.  GERD  Pertinent findings today: 1.  Paranoia and resistance to treatment continues. 2.  Patient remains isolated and noninteractive. 3.  No cogwheeling on physical examination 4.  Sleep remains stable  Plan: 1.  Attempt Risperdal 1 mg p.o. daily and 2 mg p.o. nightly for psychosis.  If patient refuses Risperdal we will give Haldol 5 mg p.o. daily and 10 mg p.o. nightly. 2.  Add Zofran 8 mg p.o. every 6 hours as needed nausea. 3.  Continue lorazepam 0.5 mg p.o. twice daily for anxiety. 4.  Continue methimazole 10 mg p.o. twice daily for hyperthyroidism. 5.  Continue ibuprofen 600 mg p.o. every 8 hours as needed pain. 6.  Continue Pepcid 20 mg p.o. twice daily for dyspepsia. 7.  Disposition planning-in progress.  Sharma Covert, MD 07/01/2019, 11:13 AM

## 2019-07-01 NOTE — Progress Notes (Signed)
   07/01/19 2100  Psych Admission Type (Psych Patients Only)  Admission Status Voluntary  Psychosocial Assessment  Patient Complaints Anxiety  Eye Contact Avoids;Brief  Facial Expression Anxious  Affect Anxious  Speech Slurred  Interaction Avoidant;Cautious;Forwards little;Guarded;Isolative;Minimal  Motor Activity Lethargic;Slow  Appearance/Hygiene Disheveled  Behavior Characteristics Cooperative  Mood Preoccupied  Thought Process  Coherency Incoherent  Content Preoccupation  Delusions Paranoid  Perception Derealization  Hallucination None reported or observed  Judgment Impaired  Confusion Mild  Danger to Self  Current suicidal ideation? Denies  Danger to Others  Danger to Others None reported or observed

## 2019-07-01 NOTE — Progress Notes (Signed)
Pt up to the nursing station, pt appeared to have tongue protruding . Pt given 50 mg Benadryl one time per [facility] and NP-Jason. Order for 25 mg q6 put in [employer]

## 2019-07-01 NOTE — Progress Notes (Signed)
SPIRITUALITY GROUP NOTE  Pt attended spirituality group facilitated by Simone Curia, MDIv, [place].  Group Description: Group focused on topic of hope. Patients participated in facilitated discussion around topic, connecting with one another around experiences and definitions for hope. Group members engaged with visual explorer photos, reflecting on what hope looks like for them today. Group engaged in discussion around how their definitions of hope are present today in hospital.  Modalities: Psycho-social ed, Adlerian, Narrative, MI  Patient Progress:  Invited.  DID NOT ATTEND

## 2019-07-01 NOTE — [facility] Suicide Risk Assessment (Signed)
[place] INPATIENT:  Family/Significant Other Suicide Prevention Education  Suicide Prevention Education: Education Completed; Mother, Simrun Laroque [phone]), has been identified by the patient as the family member/significant other with whom the patient will be residing, and identified as the person(s) who will aid the patient in the event of a mental health crisis (suicidal ideations/suicide attempt).  With written consent from the patient, the family member/significant other has been provided the following suicide prevention education, prior to the and/or following the discharge of the patient.  The suicide prevention education provided includes the following:  Suicide risk factors  Suicide prevention and interventions  National Suicide Hotline telephone number  [facility] assessment telephone number  [facility] Emergency Assistance [place] and/or Residential Mobile Crisis Unit telephone number   Request made of family/significant other to:  Remove weapons (e.g., guns, rifles, knives), all items previously/currently identified as safety concern.    Remove drugs/medications (over-the-counter, prescriptions, illicit drugs), all items previously/currently identified as a safety concern.   The family member/significant other verbalizes understanding of the suicide prevention education information provided.  The family member/significant other agrees to remove the items of safety concern listed above.   Patients mother reported that this patient has had increased anxiety recently due to her landlord making her move to a second floor apartment, which she has trouble walking up and down the stairs. Patients mother also stated that she feels like the patient has also been stressed due to not having enough room to unpack all of her belongings and states that her daughter is unable to assist in cleaning and taking care of the apartment. Patient mother, Everlene Farrier  expressed no concerns for this patient returning home. Patients mother reported that this patient does not have any weapons in the home.    Darletta Moll MSW, [place] Worker  [facility]

## 2019-07-02 MED ORDER — WHITE PETROLATUM EX OINT
TOPICAL_OINTMENT | CUTANEOUS | Status: AC
  Filled 2019-07-02: qty 5

## 2019-07-02 MED ORDER — RISPERIDONE 2 MG PO TBDP
2.0000 mg | ORAL_TABLET | Freq: Every day | ORAL | Status: DC
  Filled 2019-07-02 (×2): qty 1

## 2019-07-02 MED ORDER — RISPERIDONE 1 MG PO TBDP
1.0000 mg | ORAL_TABLET | Freq: Every day | ORAL | Status: DC
  Administered 2019-07-02 – 2019-07-03 (×2): 1 mg via ORAL
  Filled 2019-07-02 (×4): qty 1

## 2019-07-02 MED ORDER — RISPERIDONE 2 MG PO TBDP
3.0000 mg | ORAL_TABLET | Freq: Every day | ORAL | Status: DC
  Administered 2019-07-02: 3 mg via ORAL
  Filled 2019-07-02 (×4): qty 1

## 2019-07-02 NOTE — Progress Notes (Signed)
Pt up pacing in the room waking up her roommate. Pt has slept much of the day

## 2019-07-02 NOTE — Progress Notes (Signed)
[facility] MD Progress Note  07/02/2019 11:24 AM Veronica Perez  MRN:  [id number] Subjective:  Patient is a 44 year old female with a past psychiatric history significant for bipolar disorder, panic disorder, hyperthyroidism who presented to the [facility] emergency department on 06/27/2019 secondary to debility, tearfulness, paranoia.  Veronica Perez found sitting in her room. She is pressured and tangential. She reports her sleep was improved overnight, but nursing reports that she only slept 3.25 hours overnight. She did sleep during the day yesterday. She was given Benadryl last night for reported side effects to Haldol. Of note, patient was speaking normally throughout assessment with no tongue disturbances, until medication side effects were mentioned and patient then began sticking out her tongue and having dysarthric speech. She states she is having tongue movements from Haldol. Haldol was discontinued yesterday with the understanding that patient must take Risperdal instead. Benadryl is ordered PRN for EPS. She denies paranoia but does appear anxious. She denies SI/HI/AVH. She shows no signs of responding to internal stimuli.   Principal Problem: <principal problem not specified> Diagnosis: Active Problems:   Bipolar disorder, current episode manic severe with psychotic features ([place])  Total Time spent with patient: 15 minutes  Past Psychiatric History: See admission H&P  Past Medical History:  Past Medical History:  Diagnosis Date  . Bipolar disorder ([place])   . Depression   . HSV (herpes simplex virus) infection   . Panic attacks   . Thyroid disease     Past Surgical History:  Procedure Laterality Date  . ADENOIDECTOMY    . OVARIAN CYST SURGERY    . TONSILLECTOMY     Family History:  Family History  Problem Relation Age of Onset  . Diabetes Father   . Heart disease Father   . Cancer Maternal Grandmother        breast cancer  . Bipolar disorder Mother   . Thyroid  disease Neg Hx    Family Psychiatric  History: See admission H&P Social History:  Social History   Substance and Sexual Activity  Alcohol Use Not Currently   Comment: consumes less than 4x year     Social History   Substance and Sexual Activity  Drug Use No    Social History   Socioeconomic History  . Marital status: Single    Spouse name: Not on file  . Number of children: Not on file  . Years of education: Not on file  . Highest education level: High school graduate  Occupational History  . Not on file  Tobacco Use  . Smoking status: Former Smoker    Quit date: 01/18/2012    Years since quitting: 7.4  . Smokeless tobacco: Never Used  Substance and Sexual Activity  . Alcohol use: Not Currently    Comment: consumes less than 4x year  . Drug use: No  . Sexual activity: Yes    Birth control/protection: None  Other Topics Concern  . Not on file  Social History Narrative  . Not on file   Social Determinants of Health   Financial Resource Strain:   . Difficulty of Paying Living Expenses:   Food Insecurity:   . Worried About Charity fundraiser in the Last Year:   . Arboriculturist in the Last Year:   Transportation Needs:   . Film/video editor (Medical):   Marland Kitchen Lack of Transportation (Non-Medical):   Physical Activity:   . Days of Exercise per Week:   . Minutes of  Exercise per Session:   Stress:   . Feeling of Stress :   Social Connections:   . Frequency of Communication with Friends and Family:   . Frequency of Social Gatherings with Friends and Family:   . Attends Religious Services:   . Active Member of Clubs or Organizations:   . Attends Archivist Meetings:   Marland Kitchen Marital Status:    Additional Social History:                         Sleep: Fair  Appetite:  Fair  Current Medications: Current Facility-Administered Medications  Medication Dose Route Frequency Provider Last Rate Last Admin  . diphenhydrAMINE (BENADRYL) 25 mg  capsule           . diphenhydrAMINE (BENADRYL) capsule 25 mg  25 mg Oral Q6H PRN Lindon Romp A, NP   25 mg at 07/02/19 0820  . famotidine (PEPCID) tablet 20 mg  20 mg Oral BID Sharma Covert, MD   20 mg at 07/02/19 0816  . haloperidol (HALDOL) tablet 10 mg  10 mg Oral QHS PRN Sharma Covert, MD       Or  . haloperidol lactate (HALDOL) injection 10 mg  10 mg Intramuscular QHS PRN Sharma Covert, MD      . haloperidol (HALDOL) tablet 5 mg  5 mg Oral Daily PRN Sharma Covert, MD       Or  . haloperidol lactate (HALDOL) injection 5 mg  5 mg Intramuscular Daily PRN Sharma Covert, MD      . ibuprofen (ADVIL) tablet 600 mg  600 mg Oral Q8H PRN Mordecai Maes, NP      . LORazepam (ATIVAN) tablet 0.5 mg  0.5 mg Oral BID Suella Broad, FNP   0.5 mg at 07/02/19 0820  . risperiDONE (RISPERDAL M-TABS) disintegrating tablet 2 mg  2 mg Oral Q8H PRN Sharma Covert, MD   2 mg at 06/29/19 2054   And  . LORazepam (ATIVAN) tablet 1 mg  1 mg Oral PRN Sharma Covert, MD       And  . ziprasidone (GEODON) injection 20 mg  20 mg Intramuscular PRN Sharma Covert, MD      . methimazole (TAPAZOLE) tablet 10 mg  10 mg Oral BID Sharma Covert, MD   10 mg at 06/30/19 0752  . ondansetron (ZOFRAN) tablet 8 mg  8 mg Oral Q8H PRN Sharma Covert, MD      . white petrolatum (VASELINE) gel             Lab Results: No results found for this or any previous visit (from the past 48 hour(s)).  Blood Alcohol level:  Lab Results  Component Value Date   ETH <10 06/26/2019   ETH <5 0000000    Metabolic Disorder Labs: Lab Results  Component Value Date   HGBA1C 5.0 06/28/2019   MPG 96.8 06/28/2019   MPG 97 10/18/2018   Lab Results  Component Value Date   PROLACTIN 13.1 06/28/2019   PROLACTIN 69.3 (H) 09/17/2016   Lab Results  Component Value Date   CHOL 165 06/28/2019   TRIG 136 06/28/2019   HDL 35 (L) 06/28/2019   CHOLHDL 4.7 06/28/2019   VLDL 27 06/28/2019    LDLCALC 103 (H) 06/28/2019   LDLCALC 56 12/11/2016    Physical Findings: AIMS: Facial and Oral Movements Muscles of Facial Expression: None, normal Lips and Perioral Area:  None, normal Jaw: None, normal Tongue: None, normal,Extremity Movements Upper (arms, wrists, hands, fingers): None, normal Lower (legs, knees, ankles, toes): None, normal, Trunk Movements Neck, shoulders, hips: None, normal, Overall Severity Severity of abnormal movements (highest score from questions above): None, normal Incapacitation due to abnormal movements: None, normal Patient's awareness of abnormal movements (rate only patient's report): No Awareness, Dental Status Current problems with teeth and/or dentures?: No Does patient usually wear dentures?: No  CIWA:    COWS:     Musculoskeletal: Strength & Muscle Tone: within normal limits Gait & Station: normal Patient leans: N/A  Psychiatric Specialty Exam: Physical Exam  Nursing note and vitals reviewed. Constitutional: She is oriented to person, place, and time. She appears well-developed and well-nourished.  Cardiovascular: Normal rate.  Respiratory: Effort normal.  Neurological: She is alert and oriented to person, place, and time.    Review of Systems  Constitutional: Negative.   Respiratory: Negative for cough and shortness of breath.   Psychiatric/Behavioral: Positive for sleep disturbance. Negative for agitation, behavioral problems, confusion, dysphoric mood, hallucinations, self-injury and suicidal ideas. The patient is nervous/anxious. The patient is not hyperactive.     Blood pressure (!) 129/93, pulse 87, temperature 98.5 F (36.9 C), temperature source Oral, resp. rate 18, last menstrual period 06/20/2019, SpO2 95 %.There is no height or weight on file to calculate BMI.  General Appearance: Casual  Eye Contact:  Good  Speech:  Pressured  Volume:  Normal  Mood:  Anxious  Affect:  Congruent  Thought Process:  Descriptions of  Associations: Tangential  Orientation:  Full (Time, Place, and Person)  Thought Content:  Rumination  Suicidal Thoughts:  No  Homicidal Thoughts:  No  Memory:  Immediate;   Fair Recent;   Fair  Judgement:  Fair  Insight:  Lacking  Psychomotor Activity:  Increased  Concentration:  Concentration: Fair and Attention Span: Poor  Recall:  [employer] of Knowledge:  Fair  Language:  Fair  Akathisia:  No  Handed:  Right  AIMS (if indicated):     Assets:  Armed forces logistics/support/administrative officer Housing Leisure Time  ADL's:  Intact  Cognition:  WNL  Sleep:  Number of Hours: 3.25     Treatment Plan Summary: Daily contact with patient to assess and evaluate symptoms and progress in treatment and Medication management   Continue inpatient hospitalization.  Continue Risperdal 1 mg PO QAM, 2 mg PO QHS for psychosis Continue Haldol PRN refusal of Risperdal Continue Pepcid 20 mg PO BID for GERD Continue Ativan 0.5 mg PO BID for anxiety Continue methimazole 10 mg PO BID for hyperthyroidism  Patient will participate in the therapeutic group milieu.  Discharge disposition in progress.   Connye Burkitt, NP 07/02/2019, 11:24 AM

## 2019-07-02 NOTE — Progress Notes (Signed)
Pt up this morning , pt talking better, pt not showing any signs of EPS.

## 2019-07-02 NOTE — [facility] Group Notes (Signed)
Adult Psychoeducational Group Note  Date:  07/02/2019 Time:  2:19 PM  Group Topic/Focus:  Identifying Needs:   The focus of this group is to help patients identify their personal needs that have been historically problematic and identify healthy behaviors to address their needs.  Participation Level:  Active  Participation Quality:  Appropriate  Affect:  Appropriate  Cognitive:  Appropriate  Insight: Improving  Engagement in Group:  Engaged  Modes of Intervention:  Discussion and Role-play  Additional Comments:  Pt was invested in the group and was able to add quite a bit of information. Responded well to the others in the group as well.  Paulino Rily 07/02/2019, 2:19 PM

## 2019-07-02 NOTE — Progress Notes (Signed)
Pt slept 3.25 hrs this evening, but pt slept 6.5 hrs during the day

## 2019-07-02 NOTE — Progress Notes (Signed)
Pt given PRN 25 mg Benadryl, for possible "side effects" to her medication . Pt appeared to be better after check of pt response to the medication

## 2019-07-02 NOTE — Progress Notes (Signed)
   Patient signed a 72 hour request for discharge on 06/30/19 @ 1742

## 2019-07-02 NOTE — Progress Notes (Signed)
   07/02/19 1000  Psych Admission Type (Psych Patients Only)  Admission Status Voluntary  Psychosocial Assessment  Patient Complaints Anxiety  Eye Contact Avoids;Brief  Facial Expression Anxious  Affect Anxious;Preoccupied  Speech Slurred  Interaction Avoidant;Forwards little  Motor Activity Slow  Appearance/Hygiene Unremarkable  Behavior Characteristics Cooperative  Mood Suspicious;Preoccupied  Thought Horticulturist, commercial of ideas  Content Preoccupation  Delusions Paranoid  Perception Derealization  Hallucination None reported or observed  Judgment Impaired  Confusion Mild  Danger to Self  Current suicidal ideation? Denies  Danger to Others  Danger to Others None reported or observed  Danger to Others Abnormal  Harmful Behavior to others No threats or harm toward other people  Pt refused Methimazole c/o will cause anxiety. Provider notified.

## 2019-07-03 MED ORDER — FAMOTIDINE 20 MG PO TABS: 20.0000 mg | ORAL_TABLET | Freq: Two times a day (BID) | ORAL | 0 refills | Status: DC

## 2019-07-03 MED ORDER — METHIMAZOLE 10 MG PO TABS: 10.0000 mg | ORAL_TABLET | Freq: Two times a day (BID) | ORAL | 0 refills | Status: DC

## 2019-07-03 MED ORDER — RISPERIDONE 3 MG PO TBDP: 3.0000 mg | ORAL_TABLET | Freq: Every day | ORAL | 0 refills | Status: DC

## 2019-07-03 MED ORDER — RISPERIDONE 1 MG PO TBDP: 1.0000 mg | ORAL_TABLET | ORAL | 0 refills | Status: DC

## 2019-07-03 NOTE — [facility] Suicide Risk Assessment (Addendum)
[facility] Discharge Suicide Risk Assessment   Principal Problem: Mood disorder Discharge Diagnoses: Active Problems:   Bipolar disorder, current episode manic severe with psychotic features ([place])   Total Time spent with patient: 30 minutes  Musculoskeletal: Strength & Muscle Tone: within normal limits Gait & Station: normal Patient leans: N/A  Psychiatric Specialty Exam: Review of Systems no chest pain, no shortness of breath, no nausea, no vomiting  Blood pressure 122/83, pulse 84, temperature 98.4 F (36.9 C), temperature source Oral, resp. rate (!) 102, last menstrual period 06/20/2019, SpO2 95 %.There is no height or weight on file to calculate BMI.  General Appearance: Improving grooming  Eye Contact::  Good  Speech:  Normal Rate-not pressured  Volume:  Normal  Mood:  Reports feeling better.  Currently denies feeling depressed  Affect:  Reactive, slightly anxious  Thought Process:  Linear and Descriptions of Associations: Intact  Orientation:  Full (Time, Place, and Person)-she is currently fully oriented x3.  Thought Content:  no hallucinations, no delusions  Suicidal Thoughts:  No denies suicidal or self injurious ideations, denies homicidal or violent ideations   Homicidal Thoughts:  No  Memory:  recent and remote grossly intact  Judgement:  Other:  improving   Insight:  Fair and improving  Psychomotor Activity:  Normal- no psychomotor agitation or restlessness noted . No significant distal tremors  Concentration:  Good  Recall:  Good  Fund of Knowledge:Good  Language: Good  Akathisia:  Negative  Handed:  Right  AIMS (if indicated):     Assets:  Desire for Improvement Resilience  Sleep:  Number of Hours: 6.5  Cognition: WNL  ADL's:  Intact   Mental Status Per Nursing Assessment::   On Admission:  NA  Demographic Factors:  44 year old female  Loss Factors: Had expressed concerns about her mother's health. Chronic medical illness  Historical Factors: History  of Bipolar Disorder. History of Hyperthyroidism  Risk Reduction Factors:   Sense of responsibility to family, Positive social support and Positive coping skills or problem solving skills  Continued Clinical Symptoms:  At this time patient presents alert, attentive, and is oriented x 3. She presents calm, in no acute distress or discomfort. She reports mood is better, improved . Affect is appropriate, reactive, slightly anxious. Thought process is linear. Denies suicidal ideations, denies homicidal or violent ideations, no hallucinations, not internally preoccupied, no SI or HI. She is currently future oriented, states she needs to pay bills in order to keep her home and services .  She denies medication side effects. As reviewed with staff- no psychomotor agitation or disruptive behaviors on unit  Harriett Sine, NP  has contacted patient's father- he corroborated that patient has improved and did not have concerns regarding discharge, states that  they plan to stay with patient for a period of time for added support. We spoke about the importance of adequate medical management and follow up for underlying history of hyperthyroidism. Patient expresses understanding and states she has an established PCP and has been referred to an endocrinologist .  Informed by staff that patient has requested discharge in writing 2+ days ago. Based on above assessment ( no current SI or HI, no overt psychotic symptoms, currently alert, attentive, oriented x 3, able to care for basic ADLs) there are no current grounds for involuntary commitment .  Cognitive Features That Contribute To Risk:  No gross cognitive deficits noted upon discharge. Is alert , attentive, and oriented x 3   Suicide Risk:  Mild:  Suicidal ideation of limited frequency, intensity, duration, and specificity.  There are no identifiable plans, no associated intent, mild dysphoria and related symptoms, good self-control (both objective and  subjective assessment), few other risk factors, and identifiable protective factors, including available and accessible social support.  Follow-up Information    Schedule an appointment as soon as possible for a visit  with Nche, Charlene Brooke, NP.   Specialty: Internal Medicine Contact information: [place] [address] [address]   Specialty: Emergency Medicine Why: If symptoms worsen Contact information: [place] [id number] [place] [id number] [phone]          Plan Of Care/Follow-up recommendations:  Activity:  as tolerated  Diet:  regular Tests:  NA Other:  See below   Patient is requesting discharge and there are no current grounds for involuntary commitment . She is leaving unit in good spirits, plans to return home. Patient is going to follow up for outpatient psychiatric management and as above , states she has an established PCP for ongoing medical management who is referring her to endocrinologist for management of thyroid disease .  Jenne Campus, MD 07/03/2019, 10:06 AM

## 2019-07-03 NOTE — Progress Notes (Signed)
   07/02/19 2210  Psych Admission Type (Psych Patients Only)  Admission Status Voluntary  Psychosocial Assessment  Eye Contact Avoids;Brief  Facial Expression Anxious  Affect Anxious;Preoccupied  Speech Slurred  Interaction Avoidant;Forwards little  Motor Activity Slow  Appearance/Hygiene Unremarkable  Behavior Characteristics Cooperative  Mood Preoccupied;Pleasant  Thought Horticulturist, commercial of ideas  Content Preoccupation  Delusions Paranoid  Perception Derealization  Hallucination None reported or observed  Judgment Impaired  Confusion Mild  Danger to Self  Current suicidal ideation? Denies  Danger to Others  Danger to Others None reported or observed  Danger to Others Abnormal  Harmful Behavior to others No threats or harm toward other people

## 2019-07-03 NOTE — Progress Notes (Signed)
Discharge Note:  D. Pt alert and oriented x 3. Denies SI and HI at present. No S/S of distress.  Compliant with  medications. Pt assessed by MD. Discharged home as ordered.     A.  Emotional Support offered Patient encouragement provided. All belongings from locker 38   returned to Patient at the time of discharge. Discharge instructions reveiwed with Patient.  R.  Pt verbalized understanding related to  discharge instructions. Pt signed belonging sheet in  agreement with items received from locker. Denies  concerns at this time ambulatory with  steady gait. Appears to be in no physical distress discharged to the lobby.

## 2019-07-03 NOTE — Progress Notes (Signed)
   07/02/19 2210  COVID-19 Daily Checkoff  Have you had a fever (temp > 37.80C/100F)  in the past 24 hours?  No  If you have had runny nose, nasal congestion, sneezing in the past 24 hours, has it worsened? No  COVID-19 EXPOSURE  Have you traveled outside the state in the past 14 days? No  Have you been in contact with someone with a confirmed diagnosis of COVID-19 or PUI in the past 14 days without wearing appropriate PPE? No  Have you been living in the same home as a person with confirmed diagnosis of COVID-19 or a PUI (household contact)? No  Have you been diagnosed with COVID-19? No

## 2019-07-03 NOTE — Plan of Care (Signed)
Complete Pt Discharged home.

## 2019-07-03 NOTE — Discharge Summary (Addendum)
Physician Discharge Summary Note  Patient:  Veronica Perez is an 44 y.o., female MRN:  [id number] DOB:  Apr 25, 1975 Patient phone:  [phone] (home)  Patient address:   [address] [place] [address],  Total Time spent with patient: 15 minutes  Date of Admission:  06/27/2019 Date of Discharge: 07/04/19  Reason for Admission:  Acute psychosis  Principal Problem: <principal problem not specified> Discharge Diagnoses: Active Problems:   Bipolar disorder, current episode manic severe with psychotic features [facility])   Past Psychiatric History: Chart review indicates a past psychiatric admission in November 2014 for agitated/manic presentation.  At the time she was diagnosed with bipolar disorder/mania.  At the time was discharged on Tegretol, Celexa, Flexeril, Latuda, trazodone.  Past Medical History:  Past Medical History:  Diagnosis Date  . Bipolar disorder ([place])   . Depression   . HSV (herpes simplex virus) infection   . Panic attacks   . Thyroid disease     Past Surgical History:  Procedure Laterality Date  . ADENOIDECTOMY    . OVARIAN CYST SURGERY    . TONSILLECTOMY     Family History:  Family History  Problem Relation Age of Onset  . Diabetes Father   . Heart disease Father   . Cancer Maternal Grandmother        breast cancer  . Bipolar disorder Mother   . Thyroid disease Neg Hx    Family Psychiatric  History: Unknown Social History:  Social History   Substance and Sexual Activity  Alcohol Use Not Currently   Comment: consumes less than 4x year     Social History   Substance and Sexual Activity  Drug Use No    Social History   Socioeconomic History  . Marital status: Single    Spouse name: Not on file  . Number of children: Not on file  . Years of education: Not on file  . Highest education level: High school graduate  Occupational History  . Not on file  Tobacco Use  . Smoking status: Former Smoker    Quit date: 01/18/2012     Years since quitting: 7.4  . Smokeless tobacco: Never Used  Substance and Sexual Activity  . Alcohol use: Not Currently    Comment: consumes less than 4x year  . Drug use: No  . Sexual activity: Yes    Birth control/protection: None  Other Topics Concern  . Not on file  Social History Narrative  . Not on file   Social Determinants of Health   Financial Resource Strain:   . Difficulty of Paying Living Expenses:   Food Insecurity:   . Worried About Charity fundraiser in the Last Year:   . Arboriculturist in the Last Year:   Transportation Needs:   . Film/video editor (Medical):   Marland Kitchen Lack of Transportation (Non-Medical):   Physical Activity:   . Days of Exercise per Week:   . Minutes of Exercise per Session:   Stress:   . Feeling of Stress :   Social Connections:   . Frequency of Communication with Friends and Family:   . Frequency of Social Gatherings with Friends and Family:   . Attends Religious Services:   . Active Member of Clubs or Organizations:   . Attends Archivist Meetings:   Marland Kitchen Marital Status:     Hospital Course:  From admission H&P: 44 year old female.  Presented to ER on 5/23.  Reportedly was brought in by  daughter due to paranoia.  At the time she presented tearful, distressed reporting that her mother was anemic, not eating and needed  medical help.  She also expressed concern about her father and daughter.  In ED she presented tearful, labile, internally preoccupied, reporting that characters on TV were saying her name and stating her address. While in ED she was followed by [facility] staff.  She presented labile , often crying/sobbing.  She described vague suicidal ideations but did not elaborate, and denied HI.  Did not elaborate regarding hallucinations but it was felt by staff that she was internally preoccupied at times. Patient also reported a history of headache and neck pain for which she stated she took lorazepam and "muscle relaxers". Chart notes  indicate she had a head CT scan done on 4/24 due to headache and head trauma: Reported as negative/unremarkable. Home medications were listed as Tylenol as needed for pain, Ativan 1 mg 3 times daily.   Ms. Khamvongsa was admitted for acute psychosis. She remained on the [facility] unit for six days. She was started on Risperdal. TSH was <0.010. Methimazole was started. She participated in group therapy on the unit. She responded well to treatment with no adverse effects reported. She has shown improved mood, affect, sleep, and interaction. She denies any SI/HI/AVH and contracts for safety. She has well-organized speech and behavior. No delusional thought content expressed. No paranoid thought content expressed, and she shows no visible signs of paranoia. No signs of responding to internal stimuli. She is requesting discharge. We reviewed importance of following up with PCP for thyroid, and patient states understanding. Patient states she has an established endocrinologist where she can follow up. Collateral information was obtained from her father, who denies safety concerns for discharge and states he and patient's mother will be checking in with patient frequently after discharge. Patient is discharging on the medications listed below. She agrees to follow up at [facility] and internal medicine (see below). Patient is provided with prescriptions for medications upon discharge. Her father is picking her up for discharge home.  Physical Findings: AIMS: Facial and Oral Movements Muscles of Facial Expression: None, normal Lips and Perioral Area: None, normal Jaw: None, normal Tongue: None, normal,Extremity Movements Upper (arms, wrists, hands, fingers): None, normal Lower (legs, knees, ankles, toes): None, normal, Trunk Movements Neck, shoulders, hips: None, normal, Overall Severity Severity of abnormal movements (highest score from questions above): None, normal Incapacitation due to  abnormal movements: None, normal Patient's awareness of abnormal movements (rate only patient's report): No Awareness, Dental Status Current problems with teeth and/or dentures?: No Does patient usually wear dentures?: No  CIWA:    COWS:     Musculoskeletal: Strength & Muscle Tone: within normal limits Gait & Station: normal Patient leans: N/A  Psychiatric Specialty Exam: Physical Exam  Nursing note and vitals reviewed. Constitutional: She is oriented to person, place, and time. She appears well-developed and well-nourished.  Cardiovascular: Normal rate.  Respiratory: Effort normal.  Neurological: She is alert and oriented to person, place, and time.    Review of Systems  Constitutional: Negative.   Respiratory: Negative for cough and shortness of breath.   Psychiatric/Behavioral: Negative for agitation, behavioral problems, confusion, dysphoric mood, hallucinations, self-injury, sleep disturbance and suicidal ideas. The patient is not nervous/anxious and is not hyperactive.     Blood pressure 122/83, pulse 84, temperature 98.4 F (36.9 C), temperature source Oral, resp. rate (!) 102, last menstrual period 06/20/2019, SpO2 95 %.There is no  height or weight on file to calculate BMI.  See MD's discharge SRA    Have you used any form of tobacco in the last 30 days? (Cigarettes, Smokeless Tobacco, Cigars, and/or Pipes): No  Has this patient used any form of tobacco in the last 30 days? (Cigarettes, Smokeless Tobacco, Cigars, and/or Pipes)  No  Blood Alcohol level:  Lab Results  Component Value Date   ETH <10 06/26/2019   ETH <5 0000000    Metabolic Disorder Labs:  Lab Results  Component Value Date   HGBA1C 5.0 06/28/2019   MPG 96.8 06/28/2019   MPG 97 10/18/2018   Lab Results  Component Value Date   PROLACTIN 13.1 06/28/2019   PROLACTIN 69.3 (H) 09/17/2016   Lab Results  Component Value Date   CHOL 165 06/28/2019   TRIG 136 06/28/2019   HDL 35 (L) 06/28/2019    CHOLHDL 4.7 06/28/2019   VLDL 27 06/28/2019   LDLCALC 103 (H) 06/28/2019   LDLCALC 56 12/11/2016    See Psychiatric Specialty Exam and Suicide Risk Assessment completed by Attending Physician prior to discharge.  Discharge destination:  Home  Is patient on multiple antipsychotic therapies at discharge:  No   Has Patient had three or more failed trials of antipsychotic monotherapy by history:  No  Recommended Plan for Multiple Antipsychotic Therapies: NA  Discharge Instructions    Discharge instructions   Complete by: As directed    Patient is instructed to take all prescribed medications as recommended. Report any side effects or adverse reactions to your outpatient psychiatrist. Patient is instructed to abstain from alcohol and illegal drugs while on prescription medications. In the event of worsening symptoms, patient is instructed to call the crisis hotline, 911, or go to the nearest emergency department for evaluation and treatment.     Allergies as of 07/03/2019      Reactions   Penicillins Hives, Itching, Nausea And Vomiting, Other (See Comments)   Has patient had a PCN reaction causing immediate rash, facial/tongue/throat swelling, SOB or lightheadedness with hypotension: No Has patient had a PCN reaction causing severe rash involving mucus membranes or skin necrosis: No Has patient had a PCN reaction that required hospitalization: No Has patient had a PCN reaction occurring within the last 10 years: No If all of the above answers are "NO", then may proceed with Cephalosporin use.      Medication List    STOP taking these medications   acetaminophen 500 MG tablet Commonly known as: TYLENOL   LORazepam 1 MG tablet Commonly known as: ATIVAN     TAKE these medications     Indication  famotidine 20 MG tablet Commonly known as: Pepcid Take 1 tablet (20 mg total) by mouth 2 (two) times daily.  Indication: Gastroesophageal Reflux Disease   methimazole 10 MG  tablet Commonly known as: TAPAZOLE Take 1 tablet (10 mg total) by mouth 2 (two) times daily.  Indication: Overactive Thyroid Gland   risperiDONE 1 MG disintegrating tablet Commonly known as: RISPERDAL M-TABS Take 1 tablet (1 mg total) by mouth every morning.  Indication: MIXED BIPOLAR AFFECTIVE DISORDER   risperidone 3 MG disintegrating tablet Commonly known as: RISPERDAL M-TABS Take 1 tablet (3 mg total) by mouth at bedtime.  Indication: MIXED BIPOLAR AFFECTIVE DISORDER      Follow-up Information    Schedule an appointment as soon as possible for a visit  with Nche, Charlene Brooke, NP.   Specialty: Internal Medicine Contact information: [place]  [place] [facility]PT.   Specialty: Emergency Medicine Why: If symptoms worsen Contact information: [place] [id number] [place] Follow up.   Specialty: Behavioral Health Why: Your social work team at the hospital will make an appointment for you to see Dr. Reece Levy.   They will call you on Tuesday 6/1 with that information at [phone]. Contact information: [place] [address] [phone]           Follow-up recommendations: Activity as tolerated. Diet as recommended by primary care physician. Keep all scheduled follow-up appointments as recommended.   Comments:   Patient is instructed to take all prescribed medications as recommended. Report any side effects or adverse reactions to your outpatient psychiatrist. Patient is instructed to abstain from alcohol and illegal drugs while on prescription medications. In the event of worsening symptoms, patient is instructed to call the crisis hotline, 911, or go to the nearest emergency department for evaluation and treatment.  Signed: Connye Burkitt, NP 07/04/2019,  8:07 AM   Patient seen, Suicide Assessment Completed.  Disposition Plan Reviewed

## 2019-07-03 NOTE — Progress Notes (Signed)
Adult Psychoeducational Group Note  Date:  07/03/2019 Time:  2:32 AM  Group Topic/Focus:  Wrap-Up Group:   The focus of this group is to help patients review their daily goal of treatment and discuss progress on daily workbooks.  Participation Level:  Did Not Attend  Participation Quality:  Did Not Attend  Affect:  Did Not Attend  Cognitive:  Did Not Attend  Insight: None  Engagement in Group:  Did Not Attend  Modes of Intervention:  Did Not Attend  Additional Comments:  Pt did not attend evening wrap up group tonight.  Candy Sledge 07/03/2019, 2:32 AM

## 2019-07-03 NOTE — Progress Notes (Signed)
  [facility] Adult Case Management Discharge Plan :  Will you be returning to the same living situation after discharge:  Yes,  with family At discharge, do you have transportation home?: Yes,  father Do you have the ability to pay for your medications: Yes,  insurance  Release of information consent forms completed and emailed to Medical Records, then turned in to Medical Records by CSW.   Patient to Follow up at: Follow-up Information    Schedule an appointment as soon as possible for a visit  with Nche, Charlene Brooke, NP.   Specialty: Internal Medicine Contact information: [place] [address] [address]   Specialty: Emergency Medicine Why: If symptoms worsen Contact information: [place] [id number] [place] Follow up.   Specialty: Behavioral Health Why: Your social work team at the hospital will make an appointment for you to see Dr. Reece Levy.   They will call you on Tuesday 6/1 with that information at [phone]. Contact information: [address] [place] [address] [phone]           Next level of care provider has access to [place] and Suicide Prevention discussed: Yes,  with mother  Have you used any form of tobacco in the last 30 days? (Cigarettes, Smokeless Tobacco, Cigars, and/or Pipes): No  Has patient been referred to the Quitline?: N/A patient is not a smoker  Patient has been referred for addiction treatment: N/A  Maretta Los, LCSW 07/03/2019, 10:34 AM

## 2019-11-16 ENCOUNTER — Encounter: Payer: Self-pay | Admitting: Nurse Practitioner

## 2019-11-16 ENCOUNTER — Ambulatory Visit (INDEPENDENT_AMBULATORY_CARE_PROVIDER_SITE_OTHER): Payer: Medicare Other | Admitting: Nurse Practitioner

## 2019-11-16 VITALS — BP 120/86 | HR 86 | Temp 97.9°F | Ht 64.0 in | Wt 192.2 lb

## 2019-11-16 DIAGNOSIS — Z719 Counseling, unspecified: Secondary | ICD-10-CM

## 2019-11-16 DIAGNOSIS — E0590 Thyrotoxicosis, unspecified without thyrotoxic crisis or storm: Secondary | ICD-10-CM

## 2019-11-16 NOTE — Patient Instructions (Signed)
Strep Throat, Adult Strep throat is an infection in the throat that is caused by bacteria. It is common during the cold months of the year. It mostly affects children who are 5-43 years old. However, people of all ages can get it at any time of the year. This infection spreads from person to person (is contagious) through coughing, sneezing, or having close contact. Your health care provider may use other names to describe the infection. It can be called tonsillitis (if there is swelling of the tonsils), or pharyngitis (if there is swelling at the back of the throat). What are the causes? This condition is caused by the Streptococcus pyogenes bacteria. What increases the risk? You are more likely to develop this condition if:  You care for school-age children, or are around school-age children. Children are more likely to get strep throat and may spread it to others.  You spend time in crowded places where the infection can spread easily.  You have close contact with someone who has strep throat. What are the signs or symptoms? Symptoms of this condition include:  Fever or chills.  Redness, swelling, or pain in the tonsils or throat.  Pain or difficulty when swallowing.  White or yellow spots on the tonsils or throat.  Tender glands in the neck and under the jaw.  Bad smelling breath.  Red rash all over the body. This is rare. How is this diagnosed? This condition is diagnosed by tests that check for the presence and the amount of bacteria that cause strep throat. They are:  Rapid strep test. Your throat is swabbed and checked for the presence of bacteria. Results are usually ready in minutes.  Throat culture test. Your throat is swabbed. The sample is placed in a cup that allows infections to grow. Results are usually ready in 1 or 2 days. How is this treated? This condition may be treated with:  Medicines that kill germs (antibiotics).  Medicines that relieve pain or fever.  These include: ? Ibuprofen or acetaminophen. ? Aspirin, only for patients who are over the age of 18. ? Throat lozenges. ? Throat sprays. Follow these instructions at home: Medicines   Take over-the-counter and prescription medicines only as told by your health care provider.  Take your antibiotic medicine as told by your health care provider. Do not stop taking the antibiotic even if you start to feel better. Eating and drinking   If you have trouble swallowing, try eating soft foods until your sore throat feels better.  Drink enough fluid to keep your urine pale yellow.  To help relieve pain, you may have: ? Warm fluids, such as soup and tea. ? Cold fluids, such as frozen desserts or popsicles. General instructions  Gargle with a salt-water mixture 3-4 times a day or as needed. To make a salt-water mixture, completely dissolve -1 tsp (3-6 g) of salt in 1 cup (237 mL) of warm water.  Get plenty of rest.  Stay home from work or school until you have been taking antibiotics for 24 hours.  Avoid smoking or being around people who smoke.  Keep all follow-up visits as told by your health care provider. This is important. How is this prevented?   Do not share food, drinking cups, or personal items that could cause the infection to spread to other people.  Wash your hands well with soap and water, and make sure that all people in your house wash their hands well.  Have family members tested if   they have a sore throat or fever. They may need an antibiotic if they have strep throat. Contact a health care provider if:  The glands in your neck continue to get bigger.  You develop a rash, cough, or earache.  You cough up a thick mucus that is green, yellow-brown, or bloody.  You have pain or discomfort that does not get better with medicine.  Your symptoms seem to be getting worse and not better.  You have a fever. Get help right away if:  You have new symptoms, such as  vomiting, severe headache, stiff or painful neck, chest pain, or shortness of breath.  You have severe throat pain, drooling, or changes in your voice.  You have swelling of the neck, or the skin on the neck becomes red and tender.  You have signs of dehydration, such as tiredness (fatigue), dry mouth, and decreased urination.  You become increasingly sleepy, or you cannot wake up completely.  Your joints become red or painful. Summary  Strep throat is an infection in the throat that is caused by the Streptococcus pyogenes bacteria. This infection is spread from person to person (is contagious) through coughing, sneezing, or having close contact.  Take your medicines, including antibiotics, as told by your health care provider. Do not stop taking the antibiotic even if you start to feel better.  To prevent the spread of germs, wash your hands well with soap and water. Have others do the same. Do not share food, drinking cups, or personal items.  Get help right away if you have new symptoms, such as vomiting, severe headache, stiff or painful neck, chest pain, or shortness of breath. This information is not intended to replace advice given to you by your health care provider. Make sure you discuss any questions you have with your health care provider. Document Revised: 04/09/2018 Document Reviewed: 04/09/2018 Elsevier Patient Education  2020 Elsevier Inc.  

## 2019-11-16 NOTE — Progress Notes (Signed)
° °  Subjective:  Patient ID: Veronica Perez, female    DOB: 1975/07/12  Age: 44 y.o. MRN: [id number]  CC: Follow-up (Pt would like to discuss ways to prevent strep throat. )  HPI Veronica Perez has question about streptococcus immunization. Veronica Perez wants to know is this is same as pneumococcus immunization. Veronica Perez has frequent tonsillitis and pharyngitis. Veronica Perez denies any current symptoms.  Reviewed past Medical, Social and Family history today.  Outpatient Medications Prior to Visit  Medication Sig Dispense Refill   LORazepam (ATIVAN) 1 MG tablet Take 1 mg by mouth 3 (three) times daily.     famotidine (PEPCID) 20 MG tablet Take 1 tablet (20 mg total) by mouth 2 (two) times daily. (Patient not taking: Reported on 11/16/2019) 60 tablet 0   methimazole (TAPAZOLE) 10 MG tablet Take 1 tablet (10 mg total) by mouth 2 (two) times daily. (Patient not taking: Reported on 11/16/2019) 60 tablet 0   risperiDONE (RISPERDAL M-TABS) 1 MG disintegrating tablet Take 1 tablet (1 mg total) by mouth every morning. (Patient not taking: Reported on 11/16/2019) 30 tablet 0   risperiDONE (RISPERDAL M-TABS) 3 MG disintegrating tablet Take 1 tablet (3 mg total) by mouth at bedtime. (Patient not taking: Reported on 11/16/2019) 30 tablet 0   No facility-administered medications prior to visit.    ROS See HPI  Objective:  BP 120/86 (BP Location: Left Arm, Patient Position: Sitting, Cuff Size: Normal)    Pulse 86    Temp 97.9 F (36.6 C) (Temporal)    Ht 5\' 4"  (1.626 m)    Wt 192 lb 3.2 oz (87.2 kg)    SpO2 98%    BMI 32.99 kg/m   Physical Exam Neurological:     Mental Status: Veronica Perez is alert and oriented to person, place, and time.  Psychiatric:        Attention and Perception: Attention normal.        Mood and Affect: Mood is anxious. Affect is labile.        Speech: Speech is rapid and pressured.        Behavior: Behavior is cooperative.        Thought Content: Thought content is paranoid.        Cognition and  Memory: Cognition normal.        Judgment: Judgment is impulsive.    Assessment & Plan:  This visit occurred during the SARS-CoV-2 public health emergency.  Safety protocols were in place, including screening questions prior to the visit, additional usage of staff PPE, and extensive cleaning of exam room while observing appropriate contact time as indicated for disinfecting solutions.   Veronica Perez was seen today for follow-up.  Diagnoses and all orders for this visit:  Health education/counseling  Explained the difference between streptococcus and pneumococcus bacteria. I also provided printed information on strep throat. Veronica Perez verbalized understanding and stated all her questions were answered.  Problem List Items Addressed This Visit    None    Visit Diagnoses    Health education/counseling    -  Primary      I have spent 81mins with this patient regarding history taking, documentation, formulating plan and discussing treatment options with patient.  Follow-up: Return in about 4 weeks (around 12/14/2019) for CPE (fasting).  Wilfred Lacy, NP

## 2019-11-16 NOTE — Assessment & Plan Note (Signed)
Refuses to take methimazole due to possible side effects Refuses to schedule another appt with endocrinology

## 2019-12-08 ENCOUNTER — Telehealth: Payer: Self-pay | Admitting: Nurse Practitioner

## 2019-12-08 NOTE — Telephone Encounter (Signed)
Left message for patient to schedule Annual Wellness Visit.  Please schedule with Nurse Health Advisor Martha Stanley, RN at [facility]  °

## 2020-07-19 ENCOUNTER — Telehealth: Payer: Self-pay | Admitting: Nurse Practitioner

## 2020-07-19 NOTE — Telephone Encounter (Signed)
Left message for patient to call back and schedule Medicare Annual Wellness Visit (AWV).   Please offer to do virtually or by telephone.   Due for AWVI  Please schedule at anytime with Nurse Health Advisor.   

## 2020-08-16 ENCOUNTER — Emergency Department (HOSPITAL_COMMUNITY)
Admission: EM | Admit: 2020-08-16 | Discharge: 2020-08-16 | Disposition: A | Discharge status: Home / Self Care | Payer: 59 | Attending: Emergency Medicine | Admitting: Emergency Medicine

## 2020-08-16 ENCOUNTER — Other Ambulatory Visit: Payer: Self-pay

## 2020-08-16 ENCOUNTER — Encounter (HOSPITAL_COMMUNITY): Payer: Self-pay

## 2020-08-16 DIAGNOSIS — Y9301 Activity, walking, marching and hiking: Secondary | ICD-10-CM | POA: Diagnosis not present

## 2020-08-16 DIAGNOSIS — S80911A Unspecified superficial injury of right knee, initial encounter: Secondary | ICD-10-CM | POA: Diagnosis present

## 2020-08-16 DIAGNOSIS — Z23 Encounter for immunization: Secondary | ICD-10-CM | POA: Insufficient documentation

## 2020-08-16 DIAGNOSIS — R000 Tachycardia, unspecified: Secondary | ICD-10-CM | POA: Diagnosis not present

## 2020-08-16 DIAGNOSIS — F17210 Nicotine dependence, cigarettes, uncomplicated: Secondary | ICD-10-CM | POA: Diagnosis not present

## 2020-08-16 DIAGNOSIS — S80211A Abrasion, right knee, initial encounter: Secondary | ICD-10-CM | POA: Insufficient documentation

## 2020-08-16 DIAGNOSIS — L03115 Cellulitis of right lower limb: Secondary | ICD-10-CM

## 2020-08-16 DIAGNOSIS — Y9289 Other specified places as the place of occurrence of the external cause: Secondary | ICD-10-CM | POA: Insufficient documentation

## 2020-08-16 DIAGNOSIS — E039 Hypothyroidism, unspecified: Secondary | ICD-10-CM | POA: Insufficient documentation

## 2020-08-16 DIAGNOSIS — W109XXA Fall (on) (from) unspecified stairs and steps, initial encounter: Secondary | ICD-10-CM | POA: Diagnosis not present

## 2020-08-16 LAB — PREGNANCY, URINE: Preg Test, Ur: NEGATIVE

## 2020-08-16 MED ORDER — SULFAMETHOXAZOLE-TRIMETHOPRIM 800-160 MG PO TABS
1.0000 | ORAL_TABLET | Freq: Once | ORAL | Status: AC
  Administered 2020-08-16: 1 via ORAL
  Filled 2020-08-16: qty 1

## 2020-08-16 MED ORDER — TETANUS-DIPHTH-ACELL PERTUSSIS 5-2.5-18.5 LF-MCG/0.5 IM SUSY
0.5000 mL | PREFILLED_SYRINGE | Freq: Once | INTRAMUSCULAR | Status: AC
  Administered 2020-08-16: 0.5 mL via INTRAMUSCULAR
  Filled 2020-08-16: qty 0.5

## 2020-08-16 MED ORDER — SULFAMETHOXAZOLE-TRIMETHOPRIM 800-160 MG PO TABS
1.0000 | ORAL_TABLET | Freq: Two times a day (BID) | ORAL | 0 refills | Status: AC
Start: 2020-08-16 — End: 2020-08-23

## 2020-08-16 NOTE — ED Provider Notes (Signed)
[facility] DEPT Provider Note   CSN: [id number] Arrival date & time: 08/16/20  0810     History Chief Complaint  Patient presents with   Knee Injury    Veronica Perez is a 45 y.o. female.  Patient is a 45 year old female with a history of hyperthyroidism not on medication, bipolar disease who is presenting today due to concern for infection of her right knee.  She was walking down her steps 1 week ago when she tripped and fell landing on her right knee on the concrete.  She felt like it was looking fine and scraped but in the last 12 hours and now has appeared red and there has been some mild green drainage.  She is still able to walk and bend her knee but she is concerned for infection.  She also feels that she is pregnant but does not know how far along she is.  She was planning on going to the doctor to get this checked out.  She is not sure when her last menstrual period was because she reports she is anemic and she just never knows anymore.  She denies any dysuria or abdominal pain.  The history is provided by the patient.      Past Medical History:  Diagnosis Date   Bipolar disorder ([place])    Depression    HSV (herpes simplex virus) infection    Low TSH level 05/30/2016   Panic attacks    Thyroid disease    Tobacco dependence syndrome 04/02/2006   Formatting of this note might be different from the original. 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 12/22/2012   PANIC ATTACKS 04/02/2006   TOBACCO  DEPENDENCE 04/02/2006   Atopic rhinitis 04/02/2006   Gastroesophageal reflux disease 04/02/2006   Bipolar I disorder ([place]) 04/02/2006    Past Surgical History:  Procedure Laterality Date   ADENOIDECTOMY     OVARIAN CYST SURGERY     TONSILLECTOMY       OB History     Gravida  1   Para  1   Term  1   Preterm      AB      Living  1      SAB      IAB      Ectopic      Multiple      Live Births  1           Family History  Problem Relation Age of Onset   Diabetes Father    Heart disease Father    Cancer Maternal Grandmother        breast cancer   Bipolar disorder Mother    Thyroid disease Neg Hx     Social History   Tobacco Use   Smoking status: Every Day    Packs/day: 25.00    Types: Cigarettes   Smokeless tobacco: Never  Vaping Use   Vaping Use: Never used  Substance Use Topics   Alcohol use: Yes   Drug use: No    Home Medications Prior to Admission  medications   Medication Sig Start Date End Date Taking? Authorizing Provider  LORazepam (ATIVAN) 1 MG tablet Take 1 mg by mouth 3 (three) times daily. 08/04/19   [provider]    Allergies    Penicillins  Review of Systems   Review of Systems  All other systems reviewed and are negative.  Physical Exam Updated Vital Signs BP (!) 154/79 (BP Location: Left Arm)   Pulse (!) 106   Temp 99.1 F (37.3 C) (Oral)   Resp 18   Ht 5\' 4"  (1.626 m)   Wt 88 kg   LMP 08/06/2020 (Approximate)   SpO2 93%   BMI 33.30 kg/m   Physical Exam Vitals and nursing note reviewed.  Constitutional:      General: She is not in acute distress.    Appearance: Normal appearance.  HENT:     Head: Normocephalic and atraumatic.  Eyes:     Pupils: Pupils are equal, round, and reactive to light.  Cardiovascular:     Rate and Rhythm: Tachycardia present.  Pulmonary:     Effort: Pulmonary effort is normal.  Musculoskeletal:     Cervical back: Normal range of motion and neck supple.     Right  knee: Normal range of motion. Tenderness present.       Legs:  Skin:    General: Skin is warm and dry.  Neurological:     Mental Status: She is alert and oriented to person, place, and time. Mental status is at baseline.     Sensory: No sensory deficit.     Motor: No weakness.     Gait: Gait normal.  Psychiatric:     Comments: Tangential, rapid pressured speech, poor attention    ED Results / Procedures / Treatments   Labs (all labs ordered are listed, but only abnormal results are displayed) Labs Reviewed  PREGNANCY, URINE  POC URINE PREG, ED    EKG None  Radiology No results found.  Procedures Procedures   Medications Ordered in ED Medications  Tdap (BOOSTRIX) injection 0.5 mL (has no administration in time range)    ED Course  I have reviewed the triage vital signs and the nursing notes.  Pertinent labs & imaging results that were available during my care of the patient were reviewed by me and considered in my medical decision making (see chart for details).    MDM Rules/Calculators/A&P                          Patient presenting today due to concern for infection of a wound on her right knee.  There is no evidence of septic joint at this time as patient has full range of motion.  She is able to ambulate.  Denies systemic symptoms but in the last 12 hours she feels like it has become red and started to drain.  Possible early mild cellulitis present.  Patient also is concerned that she is pregnant and cannot recall the exact dates of her last period.  Also when looking through the chart patient has a history of hypothyroidism and last was on medication a few years ago.  This would most likely explain her hypertension, tachycardia.  Urine pregnancy test today neg.  Tetanus shot was updated.  Patient was treated with antibiotics but b/c of PCN allergy and never having cephalosporins and pt does not want doxy due to risk as she still believes she is pregnant she was  treated with bactrim.  Encouraged her to follow-up with her PCP so that she could go back on treatment for the hyperthyroidism.  Final Clinical Impression(s) / ED Diagnoses Final diagnoses:  Abrasion of right knee, initial encounter  Cellulitis of right lower extremity    Rx / DC Orders ED Discharge Orders          Ordered    sulfamethoxazole-trimethoprim (BACTRIM DS) 800-160 MG tablet  2 times daily        08/16/20 4193             Blanchie Dessert, MD 08/16/20 [phone]

## 2020-08-16 NOTE — ED Triage Notes (Signed)
Patient states she tripped and fell a week ago. Patient has a right knee wound. Patient denies LOC and hitting her head when she fell a week ago.

## 2020-08-16 NOTE — Discharge Instructions (Addendum)
Clean the area daily with soap and water.  Cover with vasoline and a dressing.  The antibiotic went to [facility].  Recheck a pregnancy test in 1-2 weeks.  If still having pressure follow up with your doctor.

## 2020-08-22 ENCOUNTER — Ambulatory Visit: Payer: 59 | Admitting: Nurse Practitioner

## 2020-08-23 ENCOUNTER — Ambulatory Visit: Payer: 59 | Admitting: Nurse Practitioner

## 2020-08-23 ENCOUNTER — Encounter: Payer: Self-pay | Admitting: Nurse Practitioner

## 2020-09-03 ENCOUNTER — Emergency Department (HOSPITAL_COMMUNITY)
Admission: EM | Admit: 2020-09-03 | Discharge: 2020-09-09 | Disposition: A | Discharge status: Home / Self Care | Payer: 59 | Attending: Emergency Medicine | Admitting: Emergency Medicine

## 2020-09-03 ENCOUNTER — Other Ambulatory Visit: Payer: Self-pay

## 2020-09-03 DIAGNOSIS — Z20822 Contact with and (suspected) exposure to covid-19: Secondary | ICD-10-CM | POA: Diagnosis not present

## 2020-09-03 DIAGNOSIS — F17210 Nicotine dependence, cigarettes, uncomplicated: Secondary | ICD-10-CM | POA: Diagnosis not present

## 2020-09-03 DIAGNOSIS — F3010 Manic episode without psychotic symptoms, unspecified: Secondary | ICD-10-CM

## 2020-09-03 DIAGNOSIS — Y900 Blood alcohol level of less than 20 mg/100 ml: Secondary | ICD-10-CM | POA: Insufficient documentation

## 2020-09-03 DIAGNOSIS — E0590 Thyrotoxicosis, unspecified without thyrotoxic crisis or storm: Secondary | ICD-10-CM | POA: Diagnosis present

## 2020-09-03 DIAGNOSIS — Z79899 Other long term (current) drug therapy: Secondary | ICD-10-CM | POA: Insufficient documentation

## 2020-09-03 DIAGNOSIS — F29 Unspecified psychosis not due to a substance or known physiological condition: Secondary | ICD-10-CM | POA: Insufficient documentation

## 2020-09-03 DIAGNOSIS — N9489 Other specified conditions associated with female genital organs and menstrual cycle: Secondary | ICD-10-CM | POA: Insufficient documentation

## 2020-09-03 DIAGNOSIS — F4310 Post-traumatic stress disorder, unspecified: Secondary | ICD-10-CM | POA: Diagnosis present

## 2020-09-03 DIAGNOSIS — R451 Restlessness and agitation: Secondary | ICD-10-CM | POA: Diagnosis present

## 2020-09-03 DIAGNOSIS — F312 Bipolar disorder, current episode manic severe with psychotic features: Secondary | ICD-10-CM | POA: Insufficient documentation

## 2020-09-03 LAB — COMPREHENSIVE METABOLIC PANEL
ALT: 25 U/L (ref 0–44)
AST: 19 U/L (ref 15–41)
Albumin: 3.3 g/dL — ABNORMAL LOW (ref 3.5–5.0)
Alkaline Phosphatase: 48 U/L (ref 38–126)
Anion gap: 8 (ref 5–15)
BUN: 9 mg/dL (ref 6–20)
CO2: 24 mmol/L (ref 22–32)
Calcium: 8.7 mg/dL — ABNORMAL LOW (ref 8.9–10.3)
Chloride: 110 mmol/L (ref 98–111)
Creatinine, Ser: 0.6 mg/dL (ref 0.44–1.00)
GFR, Estimated: >60 mL/min (ref >60)
Glucose, Bld: 133 mg/dL — ABNORMAL HIGH (ref 70–99)
Potassium: 3.3 mmol/L — ABNORMAL LOW (ref 3.5–5.1)
Sodium: 142 mmol/L (ref 135–145)
Total Bilirubin: 0.6 mg/dL (ref 0.3–1.2)
Total Protein: 5.9 g/dL — ABNORMAL LOW (ref 6.5–8.1)

## 2020-09-03 LAB — CBC WITH DIFFERENTIAL/PLATELET
Abs Immature Granulocytes: 0.01 10*3/uL (ref 0.00–0.07)
Basophils Absolute: 0 10*3/uL (ref 0.0–0.1)
Basophils Relative: 0 %
Eosinophils Absolute: 0.2 10*3/uL (ref 0.0–0.5)
Eosinophils Relative: 2 %
HCT: 37 % (ref 36.0–46.0)
Hemoglobin: 12.2 g/dL (ref 12.0–15.0)
Immature Granulocytes: 0 %
Lymphocytes Relative: 37 %
Lymphs Abs: 2.6 10*3/uL (ref 0.7–4.0)
MCH: 27.9 pg (ref 26.0–34.0)
MCHC: 33 g/dL (ref 30.0–36.0)
MCV: 84.7 fL (ref 80.0–100.0)
Monocytes Absolute: 0.4 10*3/uL (ref 0.1–1.0)
Monocytes Relative: 5 %
Neutro Abs: 3.9 10*3/uL (ref 1.7–7.7)
Neutrophils Relative %: 56 %
Platelets: 258 10*3/uL (ref 150–400)
RBC: 4.37 MIL/uL (ref 3.87–5.11)
RDW: 12.7 % (ref 11.5–15.5)
WBC: 7.1 10*3/uL (ref 4.0–10.5)
nRBC: 0 % (ref 0.0–0.2)

## 2020-09-03 LAB — ACETAMINOPHEN LEVEL: Acetaminophen (Tylenol), Serum: <10 ug/mL — ABNORMAL LOW (ref 10–30)

## 2020-09-03 LAB — SALICYLATE LEVEL: Salicylate Lvl: <7 mg/dL — ABNORMAL LOW (ref 7.0–30.0)

## 2020-09-03 LAB — ETHANOL: Alcohol, Ethyl (B): <10 mg/dL (ref <10)

## 2020-09-03 LAB — RESP PANEL BY RT-PCR (FLU A&B, COVID) ARPGX2
Influenza A by PCR: NEGATIVE
Influenza B by PCR: NEGATIVE
SARS Coronavirus 2 by RT PCR: NEGATIVE

## 2020-09-03 LAB — I-STAT BETA HCG BLOOD, ED (MC, WL, AP ONLY): I-stat hCG, quantitative: <5 m[IU]/mL (ref <5)

## 2020-09-03 MED ORDER — ZIPRASIDONE MESYLATE 20 MG IM SOLR: 20.0000 mg | Freq: Once | INTRAMUSCULAR | Status: AC

## 2020-09-03 MED ORDER — LORAZEPAM 2 MG/ML IJ SOLN
2.0000 mg | Freq: Once | INTRAMUSCULAR | Status: AC
  Administered 2020-09-03: 2 mg via INTRAMUSCULAR
  Filled 2020-09-03: qty 1

## 2020-09-03 MED ORDER — LORAZEPAM 2 MG/ML IJ SOLN
2.0000 mg | Freq: Once | INTRAMUSCULAR | Status: DC
Start: 2020-09-03 — End: 2020-09-03

## 2020-09-03 MED ORDER — ZIPRASIDONE MESYLATE 20 MG IM SOLR
INTRAMUSCULAR | Status: AC
  Administered 2020-09-03: 20 mg via INTRAMUSCULAR
  Filled 2020-09-03: qty 20

## 2020-09-03 NOTE — ED Notes (Signed)
Placed Breakfast Order 
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
## 2020-09-03 NOTE — ED Notes (Signed)
[id number] Veronica Perez would like calls with updates if possible
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
## 2020-09-03 NOTE — BH Assessment (Signed)
RN to set machine up. TTS clinician attempted multiple times, no one answered machine.

## 2020-09-03 NOTE — ED Notes (Signed)
TTS in process 

## 2020-09-04 ENCOUNTER — Emergency Department (HOSPITAL_COMMUNITY): Payer: 59

## 2020-09-04 ENCOUNTER — Encounter (HOSPITAL_COMMUNITY): Payer: Self-pay | Admitting: Registered Nurse

## 2020-09-04 LAB — TSH: TSH: <0.01 u[IU]/mL — ABNORMAL LOW (ref 0.350–4.500)

## 2020-09-04 LAB — CBG MONITORING, ED: Glucose-Capillary: 84 mg/dL (ref 70–99)

## 2020-09-04 MED ORDER — OLANZAPINE 5 MG PO TABS
2.5000 mg | ORAL_TABLET | Freq: Two times a day (BID) | ORAL | Status: DC
  Administered 2020-09-04 – 2020-09-06 (×4): 2.5 mg via ORAL
  Filled 2020-09-04 (×5): qty 1

## 2020-09-04 MED ORDER — OLANZAPINE 10 MG IM SOLR
2.5000 mg | Freq: Once | INTRAMUSCULAR | Status: DC
  Filled 2020-09-04: qty 10

## 2020-09-04 MED ORDER — STERILE WATER FOR INJECTION IJ SOLN
INTRAMUSCULAR | Status: AC
  Filled 2020-09-04: qty 10

## 2020-09-04 MED ORDER — DIPHENHYDRAMINE HCL 50 MG/ML IJ SOLN
50.0000 mg | Freq: Once | INTRAMUSCULAR | Status: AC
  Administered 2020-09-04: 50 mg via INTRAMUSCULAR
  Filled 2020-09-04: qty 1

## 2020-09-04 MED ORDER — LORAZEPAM 2 MG/ML IJ SOLN
1.0000 mg | Freq: Once | INTRAMUSCULAR | Status: AC
  Administered 2020-09-04: 1 mg via INTRAMUSCULAR
  Filled 2020-09-04: qty 1

## 2020-09-04 MED ORDER — TRAZODONE HCL 50 MG PO TABS
50.0000 mg | ORAL_TABLET | Freq: Every evening | ORAL | Status: DC | PRN
  Administered 2020-09-04 – 2020-09-06 (×2): 50 mg via ORAL
  Filled 2020-09-04 (×4): qty 1

## 2020-09-04 MED ORDER — ZIPRASIDONE MESYLATE 20 MG IM SOLR
20.0000 mg | Freq: Once | INTRAMUSCULAR | Status: AC
  Administered 2020-09-04: 20 mg via INTRAMUSCULAR
  Filled 2020-09-04: qty 20

## 2020-09-04 MED ORDER — HYDROXYZINE HCL 25 MG PO TABS
25.0000 mg | ORAL_TABLET | Freq: Three times a day (TID) | ORAL | Status: DC | PRN
  Administered 2020-09-05 – 2020-09-09 (×5): 25 mg via ORAL
  Filled 2020-09-04 (×7): qty 1

## 2020-09-04 NOTE — ED Notes (Signed)
RN was able to convince patient to take PO meds vs IM; pt has flight of ideas and is fixated on leaving; Pt is redirectable but still has manic behaviors; pt is paranoid about MRSA being on the counter in room and wants a different room; Pt states understanding when staff explain there is no other room available at this time-Monique,RN

## 2020-09-04 NOTE — ED Provider Notes (Signed)
Emergency Medicine Observation Re-evaluation Note  Veronica Perez is a 45 y.o. female, seen on rounds today.  Pt initially presented to the ED for complaints of Manic Behavior Currently, the patient is communicating via hand signals.  Physical Exam  BP (!) 143/89   Pulse 94   Temp 97.8 F (36.6 C) (Oral)   Resp 20   Ht '5\' 4"'$  (1.626 m)   Wt 88 kg   LMP 08/06/2020 (Approximate)   SpO2 98%   BMI 33.30 kg/m  Physical Exam General: No distress Cardiac:  Lungs: Normal effort Psych: Not speaking, pounding on the walls  ED Course / MDM  EKG:   I have reviewed the labs performed to date as well as medications administered while in observation.  Recent changes in the last 24 hours include psych recommending inpatient treatment.  Plan  Current plan is for seeking inpatient treatment. Veronica Perez is under involuntary commitment.      Delia Heady, PA-C 09/04/20 [id number]    Wyvonnia Dusky, MD 09/04/20 [phone]

## 2020-09-04 NOTE — ED Notes (Signed)
Pt coming in and out of room, goes back with redirection/verbal cues.

## 2020-09-04 NOTE — ED Notes (Signed)
PT pacing, coming out of room demanding to leave. PT states, " I can leave if I want." Pt visually agitated and shaking. MD Trifan made aware.

## 2020-09-04 NOTE — ED Notes (Signed)
Patient transported to CT 

## 2020-09-04 NOTE — ED Notes (Signed)
Patient found to be sleeping with sheet wrapped around neck; RN removed sheet from neck; Pt calm at this time; Pt advised she was cold; RN used sheet to cover patient up and advised to not placed sheet on neck; Sitter present-Monique,RN

## 2020-09-04 NOTE — BH Assessment (Signed)
Comprehensive Clinical Assessment (CCA) Note  09/04/2020 Veronica Perez [id number]  Disposition: Margorie John, PA, patient meets inpatient criteria. Disposition SW will secure placement in the AM. Monique, RN, informed of disposition.  The patient demonstrates the following risk factors for suicide: Chronic risk factors for suicide include: psychiatric disorder of acute psychosis . Acute risk factors for suicide include: N/A. Protective factors for this patient include: positive social support. Considering these factors, the overall suicide risk at this point appears to be high. Patient is not appropriate for outpatient follow up.  [facility] ED from 09/03/2020 in [facility] ED from 08/16/2020 in [facility] DEPT ED to Hosp-Admission (Discharged) from 06/27/2019 in [facility]  C-SSRS RISK CATEGORY Error: Question 1 not populated No Risk No Risk      1:1 risk  Wafa Egert is a 45 year old female presenting under IVC due to paranoia. EDP was petitioner. Patient was only able to deny SI, HI, suicide attempts and self-harming behaviors. Patient did not answer anymore questions. Several attempts were made to encourage patient to answer questions. TTS clinician observed patient in the bed covering her face with her arms. Unable to assess for rest of assessment. Patient shared no collateral contact for TTS clinician to get additional information.  PER TRIAGE NOTE Pt presents with [employer]   Family attempted to IVC her earlier today  Request was denied per [employer] but pt has declined since then.  Flight of ideas.  Believes she is pregnant.  Is on her period and putting period blood on her legs and body.  EDP in triage.  Believes electricity is out to get her and turned off the power at home.  PER IVC Brought to the emergency department in custody of [place]. Patient extremely agitated  and paranoid. Patient aggressive, trying to break computer equipment in the emergency department. She is smearing her menstrual blood all over her body. She is extremely distraught, think electricity is trying to kill her. She apparently tried to turn off al of the electricity in her home earlier.   Chief Complaint:  Chief Complaint  Patient presents with   Manic Behavior   Visit Diagnosis: Psychosis disorder  CCA Biopsychosocial Patient Reported Schizophrenia/Schizoaffective Diagnosis in Past: No data recorded  Strengths: uta  Mental Health Symptoms Depression:   -- Pincus Badder)   Duration of Depressive symptoms:    Mania:   Racing thoughts; Recklessness; Change in energy/activity; Irritability   Anxiety:    Irritability; Restlessness   Psychosis:   Delusions   Duration of Psychotic symptoms:  Duration of Psychotic Symptoms: -- Pincus Badder)   Trauma:   -- Pincus Badder)   Obsessions:   -- (uta)   Compulsions:   Repeated behaviors/mental acts; Disrupts with routine/functioning   Inattention:   -- Pincus Badder)   Hyperactivity/Impulsivity:   -- Pincus Badder)   Oppositional/Defiant Behaviors:   -- Pincus Badder)   Emotional Irregularity:   -- Pincus Badder)   Other Mood/Personality Symptoms:  No data recorded   Mental Status Exam Appearance and self-care  Stature:   Average   Weight:   Average weight   Clothing:   Age-appropriate   Grooming:   Normal   Cosmetic use:   Age appropriate   Posture/gait:   Normal   Motor activity:   Not Remarkable   Sensorium  Attention:   Confused   Concentration:   -- Pincus Badder)   Orientation:   -- Pincus Badder)   Recall/memory:   -- (  uta)   Affect and Mood  Affect:   -- Pincus Badder)   Mood:   -- Pincus Badder)   Relating  Eye contact:   Fleeting   Facial expression:   -- Pincus Badder)   Attitude toward examiner:  No data recorded  Thought and Language  Speech flow:  Soft; Slurred Pincus Badder)   Thought content:   Delusions   Preoccupation:   -- Pincus Badder)   Hallucinations:    -- (paranoia)   Organization:  No data recorded  [employer] of Knowledge:   -- Pincus Badder)   Intelligence:   -- Pincus Badder)   Abstraction:  No data recorded  Judgement:   Poor; Impaired; Dangerous   Reality Testing:   Distorted   Insight:   Lacking; Poor   Decision Making:   Confused   Social Functioning  Social Maturity:   Impulsive   Social Judgement:   Heedless   Stress  Stressors:   -- Special educational needs teacher)   Coping Ability:  No data recorded  Skill Deficits:   -- Pincus Badder)   Supports:   Family    Religion:   Leisure/Recreation: Leisure / Recreation Do You Have Hobbies?:  Pincus Badder)  Exercise/Diet: Exercise/Diet Do You Follow a Special Diet?:  (uta) Do You Have Any Trouble Sleeping?:  (uta)  CCA Employment/Education Employment/Work Situation: Employment / Work Situation Employment Situation: On disability Why is Patient on Disability: uta How Long has Patient Been on Disability: uta Patient's Job has Been Impacted by Current Illness: No Has Patient ever Been in the [employer]?: No  Education:   CCA Family/Childhood History Family and Relationship History: Family history Marital status:  (uta) Does patient have children?:  (uta)  Childhood History:  Childhood History Did patient suffer any verbal/emotional/physical/sexual abuse as a child?:  (uta) Did patient suffer from severe childhood neglect?:  (uta) Has patient ever been sexually abused/assaulted/raped as an adolescent or adult?:  (uta) Was the patient ever a victim of a crime or a disaster?:  (uta) Witnessed domestic violence?:  (uta) Has patient been affected by domestic violence as an adult?:  Special educational needs teacher)  Child/Adolescent Assessment:   CCA Substance Use Alcohol/Drug Use: Alcohol / Drug Use Pain Medications: See PTA medication list Prescriptions: See PTA medication list Over the Counter: See PTA medication list History of alcohol / drug use?: No history of alcohol / drug abuse   ASAM's:  Six  Dimensions of Multidimensional Assessment  Dimension 1:  Acute Intoxication and/or Withdrawal Potential:      Dimension 2:  Biomedical Conditions and Complications:      Dimension 3:  Emotional, Behavioral, or Cognitive Conditions and Complications:     Dimension 4:  Readiness to Change:     Dimension 5:  Relapse, Continued use, or Continued Problem Potential:     Dimension 6:  Recovery/Living Environment:     ASAM Severity Score:    ASAM Recommended Level of Treatment:     Substance use Disorder (SUD)   Recommendations for Services/Supports/Treatments:   Discharge Disposition:   DSM5 Diagnoses: Patient Active Problem List   Diagnosis Date Noted   Abnormal uterine bleeding (AUB) 12/09/2018   Breast cancer screening 12/09/2018   Hyperthyroidism 11/24/2016   Bipolar disorder, current episode manic severe with psychotic features ([place]) 09/16/2016   PTSD (post-traumatic stress disorder) 09/16/2016   Panic disorder 09/16/2016   Heart palpitations 05/30/2016   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

## 2020-09-04 NOTE — ED Notes (Addendum)
Back from CT. TSH added on. Sleeping/ sedated. Lying flat in hospital bed. Calm, NAD, rise and fall of chest noted, surgical mask in place.

## 2020-09-04 NOTE — ED Notes (Addendum)
Pt sleeping, resting, NAD, calm. Asleep in bed with HOB 75 degrees. Intermittently eating periodically, and sleeping. Sitter present.

## 2020-09-04 NOTE — BH Assessment (Addendum)
Disposition :  Per Earleen Newport, NP, continue to recommend inpatient psychiatric treatment. Chart and medications reviewed.  Orders placed and medications started.  Please see note for medication orders and recommendation.  '@2230'$ , No [facility] bed availability. Patient faxed to multiple facilities for consideration of bed placement.    Destination  Service Provider Request Status Selected Services Address Phone Fax Patient Preferred  [facility]  Pending - Request Sent N/A [address], [place] [address] [phone] --  [facility]  Pending - Request Sent N/A [address] [place] [address] [phone] --  [place] N/A [place], [place] [address] [place] [phone] [place] [address] [place] --  [facility]  Pending - Request Sent N/A [address] [place], [place] [address] [id number] --  [facility]  Pending - Request Sent N/A [address] [place] [address] [phone] --  [facility]  Pending - Request Sent N/A [address] [place] [address] [phone] --  [facility]  Pending - Request Sent N/A [address], [place] [address] [phone] --  [facility]  Pending - Request Sent N/A [id number] Jeanene Erb [place] [address] [phone] --  [place] N/A [address] [place] [address] [phone] --  [facility]  Pending - Request Sent N/A [place], [place] [address] [id number] --  [facility]  Pending - Request Sent N/A [place] [place] [address]  [phone] --  [place] N/A [address], [place] [address] [phone] --  [facility]  Pending - Request Sent N/A [address] [place] [address] [phone] --  [facility]  Pending - Request Sent N/A [address] [place] [address] [phone] --  [facility]  Pending - Request Sent N/A [address] [place] [address] [id number] [phone] --  [place] N/A [place], [place] [address] [phone] --  [facility]  Pending - Request Sent N/A [address] [place] [address] [phone] --  [place]  Pending - No Request Sent N/A [place], [place] [address] [id number] [phone] --  [facility]  Pending - No Request Sent N/A [address] [place] [address] [phone]

## 2020-09-04 NOTE — ED Notes (Signed)
Pt shower prepared by sitter. Pt escorted to shower, pt started making racist comments "african americans can't touch me because they have different diseases than we do, they  Just can't touch me, my daughter has to have a clean room, all of this is cross-contaminated." Pt provided with new linen and scrubs because pt threw initial supplies in water. Pt told several times to clean self, as she was covered in blood. Pt finally agreed to change into new scrubs and underwear with fresh pad. Pt escorted back to room after shower.

## 2020-09-04 NOTE — Progress Notes (Signed)
Patient information has been sent to [facility] via secure chat to review for potential admission. Patient meets inpatient criteria per Margorie John, PA-C.   Situation ongoing, CSW will continue to monitor progress.    Signed:  Mariea Clonts, MSW, LCSW-A  09/04/2020 8:37 AM

## 2020-09-04 NOTE — ED Notes (Signed)
Pt pacing, getting loud, hitting door, and sobbing. PA Khatri notified.

## 2020-09-04 NOTE — Consult Note (Signed)
  Veronica Perez is a 45 year old female admitted to [facility] ED after presenting via [facility] police with complaints of manic and bizarre behavior with flight of ideas.  Believes she is pregnant.  Is on her period and putting period blood on her legs and body.  IVC petition by EDP.    Writer reviewed patients' chart, medications, and consulted with Dr. [name] on 09/04/2020.  Attempted to see patient via tele health but unable to related to patient being medicated related to her getting loud, pacing, hitting door and crying.   Records also indicate that patient presented to ED on 08/16/2020 with complaint of fall and thinking she may be pregnant.   No psychotropic medications currently ordered  Medication Management Started the following medications: Zyprexa 205 mg Bid for mood stabilization/psychosis Trazodone 50 mg Q hs prn sleep Vistaril 25 mg Tid prn anxiety  Recommendation:   ECG to monitor QTc prolongation (related to starting psychotropic medications) Ordered TSH related to patient reporting history of hyperthyroidism and not on any medications Ordered CT head.  Related to recent fall.  Possible head injury not reported, Compare to head CT scan done 06/2019.   Disposition: Recommend psychiatric Inpatient admission when medically cleared.    Secure message sent to Lawrence Santiago, RN patients nurse informing:  Continue to recommend inpatient psychiatric treatment.  Unable to complete tele psych consult.    Chart and medications reviewed.  Orders placed and medications started.  Please see note for medication orders and recommendation.  Inform MD only default listed.     Earleen Newport, NP

## 2020-09-05 DIAGNOSIS — F312 Bipolar disorder, current episode manic severe with psychotic features: Secondary | ICD-10-CM

## 2020-09-05 LAB — RAPID URINE DRUG SCREEN, HOSP PERFORMED
Amphetamines: NOT DETECTED
Barbiturates: NOT DETECTED
Benzodiazepines: NOT DETECTED
Cocaine: NOT DETECTED
Opiates: NOT DETECTED
Tetrahydrocannabinol: POSITIVE — AB

## 2020-09-05 NOTE — ED Notes (Signed)
Pt allowed to take VS however would not allow to take temp. Attempted to ask pt questions however she would not speak. Pt crying while in the room but would not acknowledge why

## 2020-09-05 NOTE — ED Notes (Signed)
Attempted to call mother back and received an answer and was advised that it would be a hardship on her and her husband if their daughter was accepted anywhere that wasn't close to home. Advised that we had no control over where she was accepted that we can only send her to where she is accepted. Mother then stated that they will just have to come get her and take her home. Advised that the pt is under IVC and that cannot happened. She then asked to speak with the pt advised the pt was asleep. She then asked about the pt condition and status. Advised that due to legal reasons I was unable to provide that information that I could send the psych provider a message to call her and speak with her. Messaged sent to psych reference same

## 2020-09-05 NOTE — Discharge Instructions (Addendum)
Transport to [facility] 8/4 at 8 AM.

## 2020-09-05 NOTE — ED Notes (Signed)
Pt getting agitated and fixated on a female sitter. Telling and yelling him "leave her alone".

## 2020-09-05 NOTE — ED Notes (Signed)
Pt currently sleeping, will obtain vitals once awake.

## 2020-09-05 NOTE — Consult Note (Signed)
Telepsych Consultation   Reason for Consult:  Delusional Referring Physician:  Montine Circle, PA-C Location of Patient: [facility] [address] Location of Provider: Other: [facility]  Patient Identification: Veronica Perez MRN:  [id number] Principal Diagnosis: Bipolar disorder, current episode manic severe with psychotic features [facility]) Diagnosis:  Principal Problem:   Bipolar disorder, current episode manic severe with psychotic features ([place]) Active Problems:   PTSD (post-traumatic stress disorder)   Hyperthyroidism   Total Time spent with patient: 30 minutes  Subjective:   Veronica Perez is a 45 y.o. female patient admitted to [facility] ED after presenting via [facility] police with complaints of manic and bizarre behavior with flight of ideas.  Believes she is pregnant.  Is on her period and putting period blood on her legs and body.  IVC petition by EDP.    HPI:  Veronica Perez, 45 y.o., female patient seen via tele health by this provider, consulted with Dr. [name]; and chart reviewed on 09/05/20.  On evaluation Veronica Perez is laying in bed.  Attempted to pull herself up in bed when asked to sit up so that she could participate in assessment. She was having a difficult time sitting up right and reaching for bed rails for assistance but kept missing.  Patient in/out of sleep throughout assessment.  When asked if she was having suicidal or homicidal ideation patient responded "Hell No."  Patient also denied auditory/visual hallucinations.  When asked why she was in the hospital she stated she was pregnant.  Patient is not a good historian and was unable to complete assessment.    Patient continues to need inpatient psychiatric treatment   Past Psychiatric History: Bipolar disorder, current episode manic, PTSD,  Risk to Self:   Risk to Others:   Prior Inpatient Therapy:   Prior Outpatient Therapy:    Past Medical History:  Past Medical History:  Diagnosis Date   Bipolar disorder ([place])     Depression    HSV (herpes simplex virus) infection    Low TSH level 05/30/2016   Panic attacks    Thyroid disease    Tobacco dependence syndrome 04/02/2006   Formatting of this note might be different from the original. Overview:  Qualifier: Diagnosis of  By: Beryle Lathe    Past Surgical History:  Procedure Laterality Date   ADENOIDECTOMY     OVARIAN CYST SURGERY     TONSILLECTOMY     Family History:  Family History  Problem Relation Age of Onset   Diabetes Father    Heart disease Father    Cancer Maternal Grandmother        breast cancer   Bipolar disorder Mother    Thyroid disease Neg Hx    Family Psychiatric  History: See above Social History:  Social History   Substance and Sexual Activity  Alcohol Use Yes     Social History   Substance and Sexual Activity  Drug Use No    Social History   Socioeconomic History   Marital status: Single    Spouse name: Not on file   Number of children: Not on file   Years of education: Not on file   Highest education level: High school graduate  Occupational History   Not on file  Tobacco Use   Smoking status: Every Day    Packs/day: 25.00    Types: Cigarettes   Smokeless tobacco: Never  Vaping Use   Vaping Use: Never used  Substance and Sexual Activity   Alcohol  use: Yes   Drug use: No   Sexual activity: Yes    Birth control/protection: None  Other Topics Concern   Not on file  Social History Narrative   Not on file   Social Determinants of Health   Financial Resource Strain: Not on file  Food Insecurity: Not on file  Transportation Needs: Not on file  Physical Activity: Not on file  Stress: Not on file  Social Connections: Not on file   Additional Social History:    Allergies:   Allergies  Allergen Reactions   Penicillins Hives, Itching and Nausea And Vomiting    Has patient had a PCN reaction causing immediate rash, facial/tongue/throat swelling, SOB or lightheadedness with hypotension: No Has  patient had a PCN reaction causing severe rash involving mucus membranes or skin necrosis: No Has patient had a PCN reaction that required hospitalization: No Has patient had a PCN reaction occurring within the last 10 years: No If all of the above answers are "NO", then may proceed with Cephalosporin use.    Labs:  Results for orders placed or performed during the hospital encounter of 09/03/20 (from the past 48 hour(s))  CBG monitoring, ED     Status: None   Collection Time: 09/04/20  3:37 PM  Result Value Ref Range   Glucose-Capillary 84 70 - 99 mg/dL    Comment: Glucose reference range applies only to samples taken after fasting for at least 8 hours.    Medications:  Current Facility-Administered Medications  Medication Dose Route Frequency Provider Last Rate Last Admin   hydrOXYzine (ATARAX/VISTARIL) tablet 25 mg  25 mg Oral TID PRN Jenene Kauffmann B, NP       OLANZapine (ZYPREXA) injection 2.5 mg  2.5 mg Intramuscular Once Blanchie Dessert, MD       OLANZapine (ZYPREXA) tablet 2.5 mg  2.5 mg Oral BID Lyndie Vanderloop B, NP   2.5 mg at 09/05/20 1048   traZODone (DESYREL) tablet 50 mg  50 mg Oral QHS PRN Resha Filippone B, NP   50 mg at 09/04/20 2155   Current Outpatient Medications  Medication Sig Dispense Refill   LORazepam (ATIVAN) 1 MG tablet Take 1 mg by mouth 3 (three) times daily. (Patient not taking: No sig reported)      Musculoskeletal: Strength & Muscle Tone:  Unable to assess Gait & Station:  Unable to assess Patient leans: N/A   Psychiatric Specialty Exam:  Presentation  General Appearance: Appropriate for Environment  Eye Contact:None  Speech:Slow; Slurred  Speech Volume:Decreased  Handedness:Right   Mood and Affect  Mood:Anxious; Depressed  Affect:Flat   Thought Process  Thought Processes:Disorganized; Irrevelant; Linear  Descriptions of Associations:Tangential  Orientation:Partial  Thought Content:Paranoid Ideation; Delusions  History of  Schizophrenia/Schizoaffective disorder:No  Duration of Psychotic Symptoms:-- Pincus Badder)  Hallucinations:Hallucinations: Auditory Description of Auditory Hallucinations: Unable to describe  Ideas of Reference:Paranoia  Suicidal Thoughts:Suicidal Thoughts: -- (Unable to assess)  Homicidal Thoughts:Homicidal Thoughts: -- (Unable to assess)   Sensorium  Memory:Recent Poor; Immediate Poor; Remote Poor  Judgment:Intact  Insight:-- (Unable to assess)   Executive Functions  Concentration:Poor  Attention Span:Poor  [place] of Knowledge:Poor  Language:Poor   Psychomotor Activity  Psychomotor Activity:Psychomotor Activity: Other (comment) (Unable to assess)   Assets  Assets:Other (comment) (Unable to assess)   Sleep  Sleep:Sleep: -- (Unable to assess)    Physical Exam: Physical Exam Vitals and nursing note reviewed. Exam conducted with a chaperone present.  Constitutional:      General: She is not  in acute distress.    Appearance: Normal appearance. She is not ill-appearing.  Cardiovascular:     Rate and Rhythm: Normal rate.  Pulmonary:     Effort: Pulmonary effort is normal.  Neurological:     Mental Status: She is alert.     Comments: Oriented to self   Psychiatric:        Attention and Perception: She is inattentive. She perceives auditory hallucinations.        Mood and Affect: Affect is flat.        Speech: Speech is slurred.        Behavior: Behavior is slowed.        Thought Content: Thought content is paranoid and delusional.        Judgment: Judgment is impulsive.   Review of Systems  Unable to perform ROS: Acuity of condition (Patient not a good historian and in/out of sleep during entire assessment)  Blood pressure 140/85, pulse 88, temperature 98.8 F (37.1 C), temperature source Oral, resp. rate 18, height '5\' 4"'$  (1.626 m), weight 88 kg, last menstrual period 08/06/2020, SpO2 98 %. Body mass index is 33.3 kg/m.  Treatment Plan  Summary: Daily contact with patient to assess and evaluate symptoms and progress in treatment, Medication management, and Plan Inpatient psychiatric treatment  Medication Management Continue current psychotropic medications: Zyprexa 205 mg Bid for mood stabilization/psychosis Trazodone 50 mg Q hs prn sleep Vistaril 25 mg Tid prn anxiety  Disposition: Recommend psychiatric Inpatient admission when medically cleared.   Patient has been accepted to [facility] main campus for tomorrow (09/06/2020) after 0800  This service was provided via telemedicine using a 2-way, interactive audio and video technology.  Names of all persons participating in this telemedicine service and their role in this encounter. Name: Earleen Newport Role: NP  Name: Dr. [name] Role: Psychiatrist  Name: Veronica Perez Role: Patient  Name: Thurmond Butts, RN Role: Patient's nurse sent a secure message informing:  Psychiatric consult complete.  Patient continues to need inpatient psychiatric treatment.  Please inform MD only default listed.      Renee Erb, NP 09/05/2020 3:20 PM

## 2020-09-05 NOTE — ED Notes (Signed)
Entered room to introduce myself and complete assessment on pt. Pt was standing with her back to the door. She would not acknowledge me speaking to her and would not turn around. She would not speak at all at this time

## 2020-09-05 NOTE — Progress Notes (Signed)
Patient has been faxed out due to no bed availability at [facility]. Patient meets inpatient criteria per [facility] Rankin,NP. Patient referred to the following facilities:  [facility]  [address] [place] [address] [id number]  [facility]  [address], [place] [address] [phone] [facility]  [place], [place] [address] [phone]  [facility]  [phone] [address] [place] [address] [phone] [facility]  [address] [place], [place] [address] [place]  [facility]  [address] [place] [address] [place]  [facility]  [address] [place] [address] [phone]  [facility]  [address] [place] [place] [address] [phone]  [facility]  [place] [address] [phone]  [facility]  [address], [place] [address] [phone] [facility]  [address], [place] [address] [phone]  [facility]  [address], [place] [address] [phone] [facility]  [address], [place] [address] [phone] [facility]  [address], [place] [address] [phone] [facility]  [address], [place] [address] [phone]  [facility]  [address] [place] [address] [place]  [place]  [address] [place] [address] [place]  [facility]  [address] [place] [address] [phone]  [place]  [place], [place] [address] [phone]  [facility]  [place], [place] [address] [phone]    CSW will continue to monitor disposition.    Mariea Clonts, MSW, LCSW-A  12:41 PM 09/05/2020

## 2020-09-05 NOTE — ED Notes (Signed)
Received verbal report from Armando Reichert RN at this time

## 2020-09-05 NOTE — ED Provider Notes (Signed)
Emergency Medicine Observation Re-evaluation Note  Veronica Perez is a 45 y.o. female, seen on rounds today.  Pt initially presented to the ED for complaints of Manic Behavior Currently, the patient is IVC with recommendation for inpatient psychiatric treatment.  Physical Exam  BP 124/80 (BP Location: Right Arm)   Pulse 82   Temp 98.6 F (37 C) (Oral)   Resp 18   Ht '5\' 4"'$  (1.626 m)   Wt 88 kg   LMP 08/06/2020 (Approximate)   SpO2 100%   BMI 33.30 kg/m  Physical Exam General: Sleeping. Lungs: Breathing even and unlabored. Psych: Deferred to allow patient to sleep  ED Course / MDM  EKG:EKG Interpretation  Date/Time:  Tuesday September 04 2020 14:08:02 EDT Ventricular Rate:  74 PR Interval:  174 QRS Duration: 98 QT Interval:  394 QTC Calculation: 437 R Axis:   93 Text Interpretation: Normal sinus rhythm Rightward axis Borderline ECG When compared with ECG of 06/28/2019, No significant change was found Confirmed by Delora Fuel ([id number]) on 09/04/2020 11:14:40 PM  I have reviewed the labs performed to date as well as medications administered while in observation.  Recent changes in the last 24 hours include none.  No acute events reported during shift.  Plan  Current plan is for inpatient psychiatric treatment.Veronica Perez is under involuntary commitment.      Veronica Pick, MD 09/05/20 [phone]

## 2020-09-05 NOTE — ED Notes (Signed)
Pt urinated but unable to send urine for UDS because it was contaminated with stool and blood from her period.

## 2020-09-05 NOTE — ED Provider Notes (Addendum)
Emergency Medicine Observation Re-evaluation Note  Veronica Perez is a 45 y.o. female, seen on rounds today.  Pt initially presented to the ED for complaints of Manic Behavior With delusional thoughts, flight of ideas/pressured speech. Currently calm, no new c/o overnight.   Physical Exam  BP 124/80 (BP Location: Right Arm)   Pulse 82   Temp 98.6 F (37 C) (Oral)   Resp 18   Ht 1.626 m ('5\' 4"'$ )   Wt 88 kg   LMP 08/06/2020 (Approximate)   SpO2 100%   BMI 33.30 kg/m  Physical Exam General: cal Cardiac: regular rate Lungs: breathing comfortably Psych: calm  ED Course / MDM    I have reviewed the labs performed to date as well as medications administered while in observation.  Recent changes in the last 24 hours include ED observation, continued stabilization on meds, and BH reassessment.   Plan  Current plan is for [facility] inpatient placement.  Veronica Perez is under involuntary commitment.   Lajean Saver, MD 09/05/20 [phone]  [facility] team has reassessed - pt accepted to [facility] 8/4 at 8 AM, Dr Selinda Flavin.      Lajean Saver, MD 09/05/20 [phone]

## 2020-09-05 NOTE — Progress Notes (Signed)
Patient information has been sent to [facility] via secure chat to review for potential admission. Patient meets inpatient criteria per Earleen Newport, NP.   Situation ongoing, CSW will continue to monitor progress.    Signed:  Mariea Clonts, MSW, LCSW-A  09/05/2020 8:34 AM

## 2020-09-05 NOTE — Progress Notes (Signed)
Pt accepted to [facility]     Patient meets inpatient criteria per Earleen Newport, NP  Dr.Thomas Selinda Flavin is the attending provider.    Call report to [phone]  Thurmond Butts, RN @ [facility] ED notified.     Pt scheduled  to arrive at [facility] tomorrow after 0800.  Mariea Clonts, MSW, LCSW-A  12:54 PM 09/05/2020

## 2020-09-05 NOTE — ED Notes (Addendum)
Pt mother calling for an update Everlene Farrier [phone]. Returned call phone was answered and was hung up on. Attempted to call back with no answer

## 2020-09-06 MED ORDER — ZIPRASIDONE MESYLATE 20 MG IM SOLR
20.0000 mg | Freq: Once | INTRAMUSCULAR | Status: AC
  Administered 2020-09-06: 20 mg via INTRAMUSCULAR
  Filled 2020-09-06: qty 20

## 2020-09-06 MED ORDER — STERILE WATER FOR INJECTION IJ SOLN
INTRAMUSCULAR | Status: AC
  Administered 2020-09-06: 1.2 mL
  Filled 2020-09-06: qty 10

## 2020-09-06 MED ORDER — OLANZAPINE 5 MG PO TABS
7.5000 mg | ORAL_TABLET | Freq: Two times a day (BID) | ORAL | Status: DC
  Administered 2020-09-06: 7.5 mg via ORAL
  Filled 2020-09-06 (×2): qty 2

## 2020-09-06 NOTE — ED Notes (Signed)
Pt sitting up in bed. Refusing to respond to RN. Pt points to breakfast tray, gestures and points in the air, then gestures to her lips and does a "zip closed" gesture, then points to food again. Pt was speaking to sitter earlier but will not speak to RN. Pt was agitated at shift change, aggressively grabbed shower items off nurse station counter and retreated to room, [place] door. Pt has gone to the BR and has been provided meal and beverages. Offered alternative meal if desired but pt refusing to speak to RN, holding torso tightly with both arms and covering face with hair. Rocking back and forth, but no aggressive or violent behavior at this time. RN and sitter monitoring behavior closely.

## 2020-09-06 NOTE — ED Notes (Signed)
Pt ambulated back to room crying at this time with a shirt on and no pants

## 2020-09-06 NOTE — ED Notes (Signed)
Veronica Perez with psych returned call and provided information reference mother statements at this time

## 2020-09-06 NOTE — ED Notes (Signed)
Pt refused po prn meds. Standing in room screaming and crying. Spoke with provider reference same and received orders

## 2020-09-06 NOTE — ED Provider Notes (Signed)
Emergency Medicine Observation Re-evaluation Note  Veronica Perez is a 45 y.o. female, seen on rounds today.  Pt initially presented to the ED for complaints of Manic Behavior Currently, the patient is resting.  Physical Exam  BP (!) 158/83 (BP Location: Right Arm)   Pulse 77   Temp 98.6 F (37 C) (Oral)   Resp 16   Ht '5\' 4"'$  (1.626 m)   Wt 88 kg   SpO2 100%   BMI 33.30 kg/m  Physical Exam General: resting comfortably, NAD Lungs: normal WOB Psych: currently calm and resting  ED Course / MDM  EKG:EKG Interpretation  Date/Time:  Tuesday September 04 2020 14:08:02 EDT Ventricular Rate:  74 PR Interval:  174 QRS Duration: 98 QT Interval:  394 QTC Calculation: 437 R Axis:   93 Text Interpretation: Normal sinus rhythm Rightward axis Borderline ECG When compared with ECG of 06/28/2019, No significant change was found Confirmed by Delora Fuel ([id number]) on 09/04/2020 11:14:40 PM  I have reviewed the labs performed to date as well as medications administered while in observation.    Plan  Current plan is for inpatient psychiatric care.  Patient is currently under IVC.      Lorelle Gibbs, DO 09/06/20 1651

## 2020-09-06 NOTE — ED Notes (Signed)
Pt has placement at [facility]. RN attempted report at 1140 and facility refused report. RN had previously spoken to Judson Roch in intake at [facility] who confirmed that pt was okay to come despite receiving IM geodon this morning. Christena Deem RN at [facility] refused report. This RN has called [facility] to give report 8 times since 1140 with the phone ringing without pickup or voicemail. Charge RN aware. RN reaching out to SW for further instruction.  Pt has remained agitated, intermittently talking to staff with nonsensical flight of ideas, paranoia, guarded behavior. Pt called her family and told them they needed to come get her "right away." Pt redirectable and remains nonviolent at this time.

## 2020-09-06 NOTE — ED Notes (Signed)
PT has become verbal , PT stated that she has rights and this place  Is holding her against her rights, PT stated that she has a bed at home , PT states she doesn't feel safe here and wants to know why she can't go home. PT began to repeat the same question why can't I go home . This NT ended conversation to give PT a period alone as no answers satisfied her.

## 2020-09-06 NOTE — ED Notes (Signed)
Pt is refusing to turn shower off and yelling to leave her alone at this time

## 2020-09-06 NOTE — ED Notes (Signed)
Pt is standing at the door using the towel to wipe the window down at this time

## 2020-09-06 NOTE — ED Notes (Signed)
Pt standing at closed door talking nonstop to sitter through window, repeating same flight of ideas, reporting pregnancy and says "this is no place to raise a child." Pt intermittently lays in bed talking to self then up talking again to sitter.

## 2020-09-06 NOTE — ED Notes (Signed)
Pt sitting up on side of the bed. Attempted to obtain VS on pt and pt would not give permission. She would not speak or nod her head. Pt kept her head down with her hair covering her face

## 2020-09-06 NOTE — ED Notes (Signed)
Due to PT excessive crying after taking a shower, PT refused to have vitals taken, PT was hard to redirect, RN was advised of PT's emotional state.

## 2020-09-06 NOTE — BH Assessment (Signed)
Attempted call to mother; no answer.

## 2020-09-06 NOTE — ED Notes (Signed)
Pt refused medication at this time. Pt had to be held for medication administration at this time by ED techs and security

## 2020-09-06 NOTE — ED Notes (Signed)
Tech at bedside and asked if pt wanted to take a shower per pt she does. Pt setup to take a shower at this time

## 2020-09-06 NOTE — ED Notes (Signed)
Pt returned to room from restroom at this time

## 2020-09-06 NOTE — ED Notes (Signed)
Pt less agitated and intrusive at this time, staying in room and less hyperverbal towards staff. Meal tray and beverages given. Pt denies pain, denies further needs.

## 2020-09-06 NOTE — BH Assessment (Signed)
Zyprexa increased to 7.5 mg bid as patient is agitated  Dynisha Due

## 2020-09-06 NOTE — ED Notes (Signed)
Pt out of room crying at this time ambulating to the restroom.

## 2020-09-06 NOTE — ED Notes (Addendum)
Pt asked RN to come talk, RN obliged. Pt with flight of ideas, talking about how her privacy was invaded, then how all she hears is "talk talk talk," reports that she is pregnant and "would never hurt the baby!" Pt exhibiting delusions and paranoia.  Pt did take scheduled and PRN PO meds at this time.

## 2020-09-07 MED ORDER — OLANZAPINE 5 MG PO TABS
7.5000 mg | ORAL_TABLET | Freq: Two times a day (BID) | ORAL | Status: DC
  Administered 2020-09-07 – 2020-09-09 (×4): 7.5 mg via ORAL
  Filled 2020-09-07 (×2): qty 2
  Filled 2020-09-07 (×2): qty 1
  Filled 2020-09-07: qty 2
  Filled 2020-09-07: qty 1

## 2020-09-07 MED ORDER — LORAZEPAM 2 MG/ML IJ SOLN: 1.0000 mg | Freq: Once | INTRAMUSCULAR | Status: AC

## 2020-09-07 MED ORDER — LORAZEPAM 2 MG/ML IJ SOLN
INTRAMUSCULAR | Status: AC
  Administered 2020-09-07: 1 mg via INTRAMUSCULAR
  Filled 2020-09-07: qty 1

## 2020-09-07 MED ORDER — OLANZAPINE 10 MG IM SOLR
5.0000 mg | Freq: Two times a day (BID) | INTRAMUSCULAR | Status: DC
  Filled 2020-09-07 (×3): qty 10

## 2020-09-07 MED ORDER — ZIPRASIDONE MESYLATE 20 MG IM SOLR: 20.0000 mg | Freq: Once | INTRAMUSCULAR | Status: AC

## 2020-09-07 MED ORDER — ZIPRASIDONE MESYLATE 20 MG IM SOLR
INTRAMUSCULAR | Status: AC
  Administered 2020-09-07: 20 mg via INTRAMUSCULAR
  Filled 2020-09-07: qty 20

## 2020-09-07 MED ORDER — STERILE WATER FOR INJECTION IJ SOLN
INTRAMUSCULAR | Status: AC
  Filled 2020-09-07: qty 10

## 2020-09-07 NOTE — ED Notes (Signed)
Pt started walking out of her room screaming that the staff is trying to poison her. Pt is very paranoid and agitated at this time. Pt is hard to redirect. Pt was cussing and yelling at the sitter. MD notified. Ativan IM given with the help of security for safety purposes. Will continue to monitor.

## 2020-09-07 NOTE — ED Notes (Signed)
Pt continues to pace in her room. Observed making bizarre gestures and talking to self at this time. Safety precautions maintained. No agitation observed.

## 2020-09-07 NOTE — ED Notes (Signed)
Pt is now up and asked if she can take a shower. Pt wants to make a phone call first. Currently in the nursing station. Pt is redirectable. Will continue to monitor. Safety precautions maintained.

## 2020-09-07 NOTE — ED Notes (Addendum)
Pt walking out of room and yelling about previous nurse. Pt also yelling that she doesn't care she's under IVC (as explained by this RN) and that she is going to call an Melburn Popper to ride home. RN explained to pt that she is unable to leave under IVC. Pt expressed her concern for being in [place] and saying that she only came to the hospital to be assessed for her pregnancy. RN explained that her blood test came back negative for pregnancy, to which pt stated, "I don't care, that test is wrong. I've been feeling these babies running and moving in me since May."

## 2020-09-07 NOTE — ED Notes (Signed)
Pt dressing herself after showering and dressing in her previous purple scrubs. This RN reminded pt that she was given clean scrubs to put on. Pt began to raise her voice and say that the clean scrubs are dirty and smell bad, and then attempted to put the scrubs up to this RN's face to "smell them." RN redirected pt and reminded pt that the scrubs are clean. Pt still chose to don her previous scrubs.

## 2020-09-07 NOTE — Progress Notes (Signed)
CSW spoke with [facility] admission department regarding disposition updates. The patient is currently on hold with an anticipating arrival for tomorrow after 0800. Weekend disposition social worker will follow-up.   Mariea Clonts, MSW, LCSW-A  9:56 AM 09/07/2020

## 2020-09-07 NOTE — ED Notes (Addendum)
PT at 1900 was very loud, PT stated that she was being held against her will, PT also stated that she was being poisoned, PT stated that the staff was forcing her to go to the bathroom, and that she was ready to leave. PT stated that she was given  shots of poison and  that she was pregnant and it was hurting her baby. The RN spoke with her , regarding several issue , she assured PT that the staff was doing everything to help her.PT stated that she tossed her food in the trash, PT  Refused to eat it because it was poisoned , The RN stated that the food was not poisoned. This NT placed an order for a sandwich , Also , called EVS to service PT room. PT was observed in the bathroom cleaning up, at which time EVS serviced room this NT changed PT sheets, food arrived PT sat in hallway  ate 100 % of food, After, PT was asked to return to her room. PT did with no resistance . PT has been in room calm and watching TV.

## 2020-09-07 NOTE — ED Notes (Signed)
[facility] meal tray delivered to pt. Pt appears calm and cooperative with ED staff at this time.

## 2020-09-07 NOTE — ED Notes (Signed)
Spoke to [employer] in [place] regarding pt had to be given IM meds for agitation earlier. [facility] won't take pt until 24hours post IM meds. Will try to transfer again tomorrow.

## 2020-09-07 NOTE — ED Notes (Signed)
Pt laying down in her bed with new warm blankets and television remote. Continues to be calm and cooperative with staff at this time.

## 2020-09-07 NOTE — ED Notes (Signed)
Per request, pt given mesh underwear and hygiene pad for her menstrual period. Pt ambulated self to bathroom with aforementioned supplies.

## 2020-09-07 NOTE — ED Notes (Signed)
Pt offered and given diet sprite and crackers. Per Dyann Ruddle, NT, no sandwich bags available in ED. Cafeteria was called to request a sandwich for pt

## 2020-09-07 NOTE — ED Notes (Signed)
Pt showering per request

## 2020-09-07 NOTE — ED Notes (Signed)
Lunch Ordered °

## 2020-09-07 NOTE — ED Notes (Addendum)
Pt ambulated to nurse's station, agitated about being in the ED and not at home. Pt repeatedly asked this RN "what they plan on doing to me, what do they want from me," to which this RN repeatedly explained that pt is awaiting placement at a behavioral health facility. Pt becoming increasingly agitated and beginning to raise voice, saying that she has a home to go to and she needs to call her mom and be picked up. RN reminded pt that she is under IVC and that her right to leave has been temporarily revoked. RN again explained plan of care to pt. Pt returned to her room as another pt was brought into [facility].

## 2020-09-07 NOTE — ED Notes (Addendum)
Pt at nurses station demanding her two phone calls and talking very loudly at the nurse, stating "she's going home today, that she's not staying another night in here. She has her own residence that she pays rent for and she's not going to keep staying here." The RN went to get the dr to discuss medications. [employer] at nurses station also. Provider is currently at pt's room.

## 2020-09-07 NOTE — ED Provider Notes (Signed)
Emergency Medicine Observation Re-evaluation Note  Veronica Perez is a 45 y.o. female, seen on rounds today.  Pt initially presented to the ED for complaints of Manic Behavior Currently, the patient is pacing around the room.  Physical Exam  BP (!) 142/90 (BP Location: Right Arm)   Pulse 64   Temp 98 F (36.7 C) (Oral)   Resp 20   Ht '5\' 4"'$  (1.626 m)   Wt 88 kg   SpO2 100%   BMI 33.30 kg/m  Physical Exam General: Agitated Cardiac: Normal rate Lungs: No increased work of breathing Psych: Agitated with tangential thinking and rapid pressured speech  ED Course / MDM  EKG:EKG Interpretation  Date/Time:  Tuesday September 04 2020 14:08:02 EDT Ventricular Rate:  74 PR Interval:  174 QRS Duration: 98 QT Interval:  394 QTC Calculation: 437 R Axis:   93 Text Interpretation: Normal sinus rhythm Rightward axis Borderline ECG When compared with ECG of 06/28/2019, No significant change was found Confirmed by Delora Fuel ([id number]) on 09/04/2020 11:14:40 PM  I have reviewed the labs performed to date as well as medications administered while in observation.  Recent changes in the last 24 hours include agitation.  Plan  Current plan is for patient has currently been accepted to [facility] but now is agitated and manic appearing.  Will give Geodon.  We will hold her morning Zyprexa given the Geodon injection.  Prior EKG was reviewed which showed no evidence of QT prolongation from 2 days ago.Veronica Perez is under involuntary commitment.      Malvin Johns, MD 09/07/20 [phone]

## 2020-09-07 NOTE — ED Notes (Signed)
Currently laying on bed. Called RN as pt was complaining of right knee wound to be dressed. Wound appears to be closed, dry and intact. Appears to be healing well. Pt was told to wash her knee and RN will apply bacitracin. Pt agreed to take a shower after taking a nap. Will continue to monitor. Pt states she feels tired at this time. Safety precautions maintained.

## 2020-09-07 NOTE — ED Notes (Signed)
Dr.Belfi was notified when pt was demanding and talking loudly in the nursing station. Dr.Belfi came to talk to pt at bedside. At this time, pt is not redirectable. Pt is demanding to go home. Repeatedly states in a loud voice, "I want to go home. I want to go home. I have a home. I just lost my keys." Pressured speech and flights of ideas noted. Pt also has delusions that she is pregnant at this time. Unable to redirect pt. Geodon IM given at this time with security at bedside. Pt was uncooperative with IM med. Safety precautions maintained. Currently laying on bed. Sitter 1:1 continued. Transfer to [facility] today is cancelled and will try again tomorrow. [facility] won't take pt unless 24 hrs post IM meds.

## 2020-09-07 NOTE — ED Notes (Signed)
Attempted to call [facility] for pt status. Admission nurse is not available. Will call back.

## 2020-09-07 NOTE — ED Notes (Addendum)
Pt observed pacing in the room. Pt is still paranoid and delusional. Refuses to take a shower now. Observed staring at the walls and staff at time with suspicious behavior. Pt is still redirectable at this time. Safety precautions maintained with a sitter. Will continue to monitor.

## 2020-09-07 NOTE — ED Notes (Signed)
Psych NP notified.

## 2020-09-07 NOTE — ED Notes (Signed)
Pt continues to cuss and scream at staff at this time. Security at bedside this time. Will continue to monitor. Safety precautions maintained.

## 2020-09-07 NOTE — ED Notes (Signed)
Pt continues to pace in the room. Currently appears calm but still has bizarre behavior. Will continue to monitor. Safety precautions maintained.

## 2020-09-07 NOTE — ED Notes (Signed)
Pt crying in room at this time. Pt was apologetic. Pt was talking about her son and her parents. Pt is still delusional and paranoid. Observed with bizarre behavior. Will continue to monitor. Safety precautions maintained.

## 2020-09-08 MED ORDER — ZIPRASIDONE HCL 20 MG PO CAPS
20.0000 mg | ORAL_CAPSULE | ORAL | Status: DC | PRN
  Administered 2020-09-08: 20 mg via ORAL
  Filled 2020-09-08 (×2): qty 1

## 2020-09-08 MED ORDER — LORAZEPAM 1 MG PO TABS
2.0000 mg | ORAL_TABLET | Freq: Four times a day (QID) | ORAL | Status: DC | PRN
  Administered 2020-09-08 (×2): 2 mg via ORAL
  Filled 2020-09-08 (×2): qty 2

## 2020-09-08 MED ORDER — ADULT MULTIVITAMIN W/MINERALS CH
1.0000 | ORAL_TABLET | Freq: Once | ORAL | Status: AC
  Administered 2020-09-08: 1 via ORAL
  Filled 2020-09-08: qty 1

## 2020-09-08 MED ORDER — MAGNESIUM OXIDE -MG SUPPLEMENT 400 (240 MG) MG PO TABS
800.0000 mg | ORAL_TABLET | Freq: Once | ORAL | Status: AC
  Administered 2020-09-08: 800 mg via ORAL
  Filled 2020-09-08: qty 2

## 2020-09-08 NOTE — ED Notes (Signed)
Agitated, escalating, making second phone call to vent on her parents, verbalizing anger and frustrations while yelling over phone. No change in manic behavior. Unable to rationalize.

## 2020-09-08 NOTE — ED Provider Notes (Signed)
Emergency Medicine Observation Re-evaluation Note  Veronica Perez is a 45 y.o. female, seen on rounds today.  Pt initially presented to the ED for complaints of Manic Behavior Currently, the patient is sleeping  Physical Exam  BP (!) 147/81 (BP Location: Right Arm)   Pulse 72   Temp 98.2 F (36.8 C)   Resp 12   Ht '5\' 4"'$  (1.626 m)   Wt 88 kg   SpO2 100%   BMI 33.30 kg/m  Physical Exam General: Calm, sleeping Cardiac: RRR Lungs:CTAB Psych: deferred to let patient sleep  ED Course / MDM  EKG:EKG Interpretation  Date/Time:  Tuesday September 04 2020 14:08:02 EDT Ventricular Rate:  74 PR Interval:  174 QRS Duration: 98 QT Interval:  394 QTC Calculation: 437 R Axis:   93 Text Interpretation: Normal sinus rhythm Rightward axis Borderline ECG When compared with ECG of 06/28/2019, No significant change was found Confirmed by Delora Fuel ([id number]) on 09/04/2020 11:14:40 PM  I have reviewed the labs performed to date as well as medications administered while in observation.  Recent changes in the last 24 hours include    Plan  Current plan is for Plan for [facility] yesterday but transfer was delayed due to agitation. Plan for PO meds and [facility] today at [employer] is under involuntary commitment.      Garald Balding, PA-C 09/08/20 [place], [place], DO 09/08/20 1027

## 2020-09-08 NOTE — ED Notes (Signed)
Pt to desk stating that she is "very pregnant and needs to know when she can go home"  Pt redirectable at this time.  Returned to room without incident after utilizing restroom

## 2020-09-08 NOTE — ED Notes (Signed)
Pt tearful speaking with sitter. Offered medication to take the edge off, declined, tissues were enough.

## 2020-09-08 NOTE — ED Notes (Signed)
Continues verbalization in door way of wanting to go home

## 2020-09-08 NOTE — ED Notes (Signed)
Consistent, persistent, pressured random flight of topics while standing in doorway talking with sitter. Verbalizes naturally hyper and bouncy, and resistance to medications because they have the opposite effect or the side effects. Cautious and guarded with medications. Refers to pregnancy frequently.

## 2020-09-08 NOTE — ED Notes (Signed)
Pt ambulated self to bathroom

## 2020-09-08 NOTE — ED Notes (Signed)
Agreeable to medications for racing and hyper thoughts, and anxiousness. Continues persistent ongoing non-stopping pressured speaking with sitter while standing and pacing in doorway.

## 2020-09-08 NOTE — ED Notes (Signed)
Continues manic behavior. Continuous, persistent, unstopping conversation while eating and standing in doorway with sitter.

## 2020-09-08 NOTE — ED Notes (Signed)
Hyper/ manic. Incessant talking with pressured speech. Verbalizes "paying bills, roller skating, not here to linger, here to test pregnancy, ready to go home". Some positive conversation with sitter when redirected to positives. Pt has low tolerance for being directed, rushed, and not being listened too.

## 2020-09-08 NOTE — ED Notes (Signed)
EDPA rounded.

## 2020-09-08 NOTE — ED Notes (Signed)
Pt escalating, angry, agitated, and loudly vocalizing about yesterdays events. Mad about yesterdays staff and perception of being given IM injections. Attempting to listen, develop therapeutic conversation, de-esccalate and reassure. Declining PO meds at this time.

## 2020-09-08 NOTE — ED Notes (Addendum)
Standing in doorway talking with/ at sitter. Random flight of ideas, frustrated, animated and pressured speech verbalized incessantly. Most anger about yesterdays events, staff and injections. Attempting to re-direct.

## 2020-09-08 NOTE — ED Notes (Signed)
Pt sleeping/resting. NAD, calm. First rest and sleep of the day. Arousable to voice. Moved to room 41 d/t purple area closing. Sitter present. Preparing for shift change.

## 2020-09-08 NOTE — ED Notes (Signed)
Sleeping, mildly restless, sitter present.

## 2020-09-08 NOTE — ED Notes (Signed)
Safety sitter now available and sitting outside of pt's room.

## 2020-09-08 NOTE — ED Notes (Addendum)
Up to b/r. Steady gait. Sitter present for stand-by assistance. Appears kind, cooperative, pleasant, as well as guarded and mildly anxious.

## 2020-09-08 NOTE — ED Notes (Signed)
Offered medications to make things calmer, less hyper, and decrease racing. Declined. Standing, eating and continued pressured racing thoughts talking with sitter.

## 2020-09-08 NOTE — ED Notes (Signed)
Pt given new warm blankets

## 2020-09-08 NOTE — Progress Notes (Signed)
CSW contacted [place] in reference to a follow-up on referral. CSW spoke with Lelon Frohlich who advised that she will present this patient to the nurse to see if an appropriate bed is available for placement.  Glennie [place], MSW, [place], LCAS-A Phone: [phone] Disposition/TOC

## 2020-09-08 NOTE — ED Notes (Signed)
Pt sleeping, NAD, calm. Sitter present. Preparing to call report and initiate transfer to [facility]

## 2020-09-08 NOTE — ED Notes (Signed)
Pharmacy notified for zyprexa dose.  Pt given bag meal and PO fluids

## 2020-09-08 NOTE — ED Notes (Signed)
Spoke with Jovita Kussmaul, RN at [facility] [phone]. Unable to receive pt at this time d/t IM injection given 8/5 at [place]. Can call report this evening at [place], pt can arrive after 8p tonight. Bed held. This RN will call report this evening. Pt sleeping at this time. Breakfast arrived to [facility]. Sitter present. Recent VSS.

## 2020-09-09 NOTE — ED Notes (Signed)
[facility] states ETA for transport 0830

## 2020-09-09 NOTE — ED Notes (Signed)
Again no answer at [facility] for report.  Advised per [facility] RN to attempt to arrange transport.

## 2020-09-09 NOTE — ED Notes (Signed)
Attempted report to [facility].  No answer

## 2020-09-09 NOTE — ED Notes (Signed)
Pt compliant with po meds, agreeable to transfer to [facility].  Belongings gathered from locker as well as belongings dropped off by family this morning.

## 2020-09-09 NOTE — ED Notes (Signed)
Message left with [place] for transport.  Return call back number provided

## 2020-09-09 NOTE — ED Notes (Signed)
Report called to [facility] for admission, Spoke to [place],  to be transported by [facility].

## 2020-09-09 NOTE — ED Notes (Signed)
Attempted report to [facility].  No answer a second time

## 2020-10-19 ENCOUNTER — Emergency Department (HOSPITAL_COMMUNITY)
Admission: EM | Admit: 2020-10-19 | Discharge: 2020-10-20 | Disposition: A | Discharge status: Home / Self Care | Payer: 59 | Attending: Emergency Medicine | Admitting: Emergency Medicine

## 2020-10-19 ENCOUNTER — Emergency Department (HOSPITAL_COMMUNITY): Payer: 59

## 2020-10-19 DIAGNOSIS — S32020A Wedge compression fracture of second lumbar vertebra, initial encounter for closed fracture: Secondary | ICD-10-CM | POA: Diagnosis not present

## 2020-10-19 DIAGNOSIS — F23 Brief psychotic disorder: Secondary | ICD-10-CM

## 2020-10-19 DIAGNOSIS — Y900 Blood alcohol level of less than 20 mg/100 ml: Secondary | ICD-10-CM | POA: Diagnosis not present

## 2020-10-19 DIAGNOSIS — S301XXA Contusion of abdominal wall, initial encounter: Secondary | ICD-10-CM | POA: Diagnosis not present

## 2020-10-19 DIAGNOSIS — Y92410 Unspecified street and highway as the place of occurrence of the external cause: Secondary | ICD-10-CM | POA: Diagnosis not present

## 2020-10-19 DIAGNOSIS — S0990XA Unspecified injury of head, initial encounter: Secondary | ICD-10-CM | POA: Insufficient documentation

## 2020-10-19 DIAGNOSIS — S161XXA Strain of muscle, fascia and tendon at neck level, initial encounter: Secondary | ICD-10-CM

## 2020-10-19 DIAGNOSIS — F29 Unspecified psychosis not due to a substance or known physiological condition: Secondary | ICD-10-CM | POA: Insufficient documentation

## 2020-10-19 DIAGNOSIS — S20219A Contusion of unspecified front wall of thorax, initial encounter: Secondary | ICD-10-CM

## 2020-10-19 DIAGNOSIS — R079 Chest pain, unspecified: Secondary | ICD-10-CM | POA: Insufficient documentation

## 2020-10-19 DIAGNOSIS — Z79899 Other long term (current) drug therapy: Secondary | ICD-10-CM | POA: Diagnosis not present

## 2020-10-19 DIAGNOSIS — M25561 Pain in right knee: Secondary | ICD-10-CM | POA: Diagnosis not present

## 2020-10-19 DIAGNOSIS — S3992XA Unspecified injury of lower back, initial encounter: Secondary | ICD-10-CM | POA: Diagnosis present

## 2020-10-19 LAB — URINALYSIS, ROUTINE W REFLEX MICROSCOPIC
Bilirubin Urine: NEGATIVE
Glucose, UA: NEGATIVE mg/dL
Hgb urine dipstick: NEGATIVE
Ketones, ur: 5 mg/dL — AB
Leukocytes,Ua: NEGATIVE
Nitrite: NEGATIVE
Protein, ur: NEGATIVE mg/dL
Specific Gravity, Urine: 1.025 (ref 1.005–1.030)
pH: 7 (ref 5.0–8.0)

## 2020-10-19 LAB — CBC WITH DIFFERENTIAL/PLATELET
Abs Immature Granulocytes: 0.07 10*3/uL (ref 0.00–0.07)
Basophils Absolute: 0 10*3/uL (ref 0.0–0.1)
Basophils Relative: 0 %
Eosinophils Absolute: 0.2 10*3/uL (ref 0.0–0.5)
Eosinophils Relative: 1 %
HCT: 39.3 % (ref 36.0–46.0)
Hemoglobin: 12.9 g/dL (ref 12.0–15.0)
Immature Granulocytes: 1 %
Lymphocytes Relative: 24 %
Lymphs Abs: 3.1 10*3/uL (ref 0.7–4.0)
MCH: 28.2 pg (ref 26.0–34.0)
MCHC: 32.8 g/dL (ref 30.0–36.0)
MCV: 86 fL (ref 80.0–100.0)
Monocytes Absolute: 0.7 10*3/uL (ref 0.1–1.0)
Monocytes Relative: 5 %
Neutro Abs: 9.2 10*3/uL — ABNORMAL HIGH (ref 1.7–7.7)
Neutrophils Relative %: 69 %
Platelets: 297 10*3/uL (ref 150–400)
RBC: 4.57 MIL/uL (ref 3.87–5.11)
RDW: 12.4 % (ref 11.5–15.5)
WBC: 13.3 10*3/uL — ABNORMAL HIGH (ref 4.0–10.5)
nRBC: 0 % (ref 0.0–0.2)

## 2020-10-19 LAB — TYPE AND SCREEN
ABO/RH(D): O POS
Antibody Screen: NEGATIVE

## 2020-10-19 LAB — I-STAT CHEM 8, ED
BUN: 12 mg/dL (ref 6–20)
Calcium, Ion: 1.08 mmol/L — ABNORMAL LOW (ref 1.15–1.40)
Chloride: 104 mmol/L (ref 98–111)
Creatinine, Ser: 0.9 mg/dL (ref 0.44–1.00)
Glucose, Bld: 111 mg/dL — ABNORMAL HIGH (ref 70–99)
HCT: 36 % (ref 36.0–46.0)
Hemoglobin: 12.2 g/dL (ref 12.0–15.0)
Potassium: 3.9 mmol/L (ref 3.5–5.1)
Sodium: 139 mmol/L (ref 135–145)
TCO2: 28 mmol/L (ref 22–32)

## 2020-10-19 LAB — RAPID URINE DRUG SCREEN, HOSP PERFORMED
Amphetamines: NOT DETECTED
Barbiturates: NOT DETECTED
Benzodiazepines: NOT DETECTED
Cocaine: NOT DETECTED
Opiates: NOT DETECTED
Tetrahydrocannabinol: NOT DETECTED

## 2020-10-19 LAB — I-STAT BETA HCG BLOOD, ED (MC, WL, AP ONLY): I-stat hCG, quantitative: <5 m[IU]/mL (ref <5)

## 2020-10-19 LAB — BASIC METABOLIC PANEL
Anion gap: 14 (ref 5–15)
BUN: 11 mg/dL (ref 6–20)
CO2: 26 mmol/L (ref 22–32)
Calcium: 9.4 mg/dL (ref 8.9–10.3)
Chloride: 98 mmol/L (ref 98–111)
Creatinine, Ser: 0.89 mg/dL (ref 0.44–1.00)
GFR, Estimated: >60 mL/min (ref >60)
Glucose, Bld: 115 mg/dL — ABNORMAL HIGH (ref 70–99)
Potassium: 3.9 mmol/L (ref 3.5–5.1)
Sodium: 138 mmol/L (ref 135–145)

## 2020-10-19 LAB — ETHANOL: Alcohol, Ethyl (B): <10 mg/dL (ref <10)

## 2020-10-19 MED ORDER — HYDROCODONE-ACETAMINOPHEN 5-325 MG PO TABS
1.0000 | ORAL_TABLET | Freq: Once | ORAL | Status: DC
  Filled 2020-10-19 (×2): qty 1

## 2020-10-19 MED ORDER — IBUPROFEN 400 MG PO TABS
600.0000 mg | ORAL_TABLET | Freq: Four times a day (QID) | ORAL | Status: DC | PRN
  Administered 2020-10-19: 600 mg via ORAL
  Filled 2020-10-19: qty 1

## 2020-10-19 MED ORDER — IOHEXOL 350 MG/ML SOLN
80.0000 mL | Freq: Once | INTRAVENOUS | Status: AC | PRN
  Administered 2020-10-19: 80 mL via INTRAVENOUS

## 2020-10-19 MED ORDER — ACETAMINOPHEN 325 MG PO TABS: 650.0000 mg | ORAL_TABLET | Freq: Four times a day (QID) | ORAL | Status: DC | PRN
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
## 2020-10-19 NOTE — ED Notes (Signed)
Patients mom called wanted to check in on her , if you could inform patient to call mom tmrw at the time for allowed phone calls , thank you
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
## 2020-10-19 NOTE — ED Notes (Signed)
Food given per patient request prior to lunch.
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
## 2020-10-19 NOTE — ED Notes (Signed)
Patient transported to CT with Miquel Dunn RN

## 2020-10-19 NOTE — ED Notes (Signed)
Pt responds to stimuli and able to answer questions but lethargic
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
## 2020-11-06 ENCOUNTER — Inpatient Hospital Stay (HOSPITAL_COMMUNITY)
Admission: EM | Admit: 2020-11-06 | Discharge: 2020-11-12 | DRG: 101 | Disposition: A | Discharge status: Home / Self Care | Payer: 59 | Attending: Internal Medicine | Admitting: Internal Medicine

## 2020-11-06 ENCOUNTER — Emergency Department (HOSPITAL_COMMUNITY): Payer: 59

## 2020-11-06 ENCOUNTER — Other Ambulatory Visit: Payer: Self-pay

## 2020-11-06 DIAGNOSIS — K219 Gastro-esophageal reflux disease without esophagitis: Secondary | ICD-10-CM | POA: Diagnosis present

## 2020-11-06 DIAGNOSIS — E0590 Thyrotoxicosis, unspecified without thyrotoxic crisis or storm: Secondary | ICD-10-CM | POA: Diagnosis present

## 2020-11-06 DIAGNOSIS — I440 Atrioventricular block, first degree: Secondary | ICD-10-CM | POA: Diagnosis present

## 2020-11-06 DIAGNOSIS — Z23 Encounter for immunization: Secondary | ICD-10-CM

## 2020-11-06 DIAGNOSIS — F410 Panic disorder [episodic paroxysmal anxiety] without agoraphobia: Secondary | ICD-10-CM | POA: Diagnosis present

## 2020-11-06 DIAGNOSIS — G40909 Epilepsy, unspecified, not intractable, without status epilepticus: Principal | ICD-10-CM

## 2020-11-06 DIAGNOSIS — E739 Lactose intolerance, unspecified: Secondary | ICD-10-CM | POA: Diagnosis present

## 2020-11-06 DIAGNOSIS — F22 Delusional disorders: Secondary | ICD-10-CM | POA: Diagnosis present

## 2020-11-06 DIAGNOSIS — E039 Hypothyroidism, unspecified: Secondary | ICD-10-CM | POA: Diagnosis present

## 2020-11-06 DIAGNOSIS — R569 Unspecified convulsions: Secondary | ICD-10-CM

## 2020-11-06 DIAGNOSIS — I4892 Unspecified atrial flutter: Secondary | ICD-10-CM | POA: Diagnosis present

## 2020-11-06 DIAGNOSIS — Z91041 Radiographic dye allergy status: Secondary | ICD-10-CM

## 2020-11-06 DIAGNOSIS — N390 Urinary tract infection, site not specified: Secondary | ICD-10-CM | POA: Diagnosis present

## 2020-11-06 DIAGNOSIS — F319 Bipolar disorder, unspecified: Secondary | ICD-10-CM | POA: Diagnosis present

## 2020-11-06 DIAGNOSIS — F17210 Nicotine dependence, cigarettes, uncomplicated: Secondary | ICD-10-CM | POA: Diagnosis present

## 2020-11-06 DIAGNOSIS — F17200 Nicotine dependence, unspecified, uncomplicated: Secondary | ICD-10-CM | POA: Diagnosis present

## 2020-11-06 DIAGNOSIS — Z20822 Contact with and (suspected) exposure to covid-19: Secondary | ICD-10-CM | POA: Diagnosis present

## 2020-11-06 DIAGNOSIS — Z8619 Personal history of other infectious and parasitic diseases: Secondary | ICD-10-CM

## 2020-11-06 DIAGNOSIS — F4310 Post-traumatic stress disorder, unspecified: Secondary | ICD-10-CM | POA: Diagnosis present

## 2020-11-06 DIAGNOSIS — Z880 Allergy status to penicillin: Secondary | ICD-10-CM

## 2020-11-06 DIAGNOSIS — K5900 Constipation, unspecified: Secondary | ICD-10-CM

## 2020-11-06 DIAGNOSIS — Z79899 Other long term (current) drug therapy: Secondary | ICD-10-CM

## 2020-11-06 DIAGNOSIS — F312 Bipolar disorder, current episode manic severe with psychotic features: Secondary | ICD-10-CM | POA: Diagnosis present

## 2020-11-06 DIAGNOSIS — E042 Nontoxic multinodular goiter: Secondary | ICD-10-CM | POA: Diagnosis present

## 2020-11-06 DIAGNOSIS — Z818 Family history of other mental and behavioral disorders: Secondary | ICD-10-CM

## 2020-11-06 HISTORY — DX: Hypothyroidism, unspecified: E03.9

## 2020-11-06 HISTORY — DX: Personal history of nicotine dependence: Z87.891

## 2020-11-06 LAB — CBC WITH DIFFERENTIAL/PLATELET
Abs Immature Granulocytes: 0.02 10*3/uL (ref 0.00–0.07)
Basophils Absolute: 0 10*3/uL (ref 0.0–0.1)
Basophils Relative: 0 %
Eosinophils Absolute: 0.1 10*3/uL (ref 0.0–0.5)
Eosinophils Relative: 1 %
HCT: 40.3 % (ref 36.0–46.0)
Hemoglobin: 13.4 g/dL (ref 12.0–15.0)
Immature Granulocytes: 0 %
Lymphocytes Relative: 19 %
Lymphs Abs: 1.8 10*3/uL (ref 0.7–4.0)
MCH: 28.4 pg (ref 26.0–34.0)
MCHC: 33.3 g/dL (ref 30.0–36.0)
MCV: 85.4 fL (ref 80.0–100.0)
Monocytes Absolute: 0.5 10*3/uL (ref 0.1–1.0)
Monocytes Relative: 5 %
Neutro Abs: 7.1 10*3/uL (ref 1.7–7.7)
Neutrophils Relative %: 75 %
Platelets: 328 10*3/uL (ref 150–400)
RBC: 4.72 MIL/uL (ref 3.87–5.11)
RDW: 12.4 % (ref 11.5–15.5)
WBC: 9.5 10*3/uL (ref 4.0–10.5)
nRBC: 0 % (ref 0.0–0.2)

## 2020-11-06 LAB — RAPID URINE DRUG SCREEN, HOSP PERFORMED
Amphetamines: NOT DETECTED
Barbiturates: NOT DETECTED
Benzodiazepines: NOT DETECTED
Cocaine: NOT DETECTED
Opiates: NOT DETECTED
Tetrahydrocannabinol: NOT DETECTED

## 2020-11-06 LAB — COMPREHENSIVE METABOLIC PANEL
ALT: 14 U/L (ref 0–44)
AST: 13 U/L — ABNORMAL LOW (ref 15–41)
Albumin: 3.7 g/dL (ref 3.5–5.0)
Alkaline Phosphatase: 69 U/L (ref 38–126)
Anion gap: 7 (ref 5–15)
BUN: 13 mg/dL (ref 6–20)
CO2: 23 mmol/L (ref 22–32)
Calcium: 8.8 mg/dL — ABNORMAL LOW (ref 8.9–10.3)
Chloride: 107 mmol/L (ref 98–111)
Creatinine, Ser: 0.69 mg/dL (ref 0.44–1.00)
GFR, Estimated: >60 mL/min (ref >60)
Glucose, Bld: 102 mg/dL — ABNORMAL HIGH (ref 70–99)
Potassium: 4.3 mmol/L (ref 3.5–5.1)
Sodium: 137 mmol/L (ref 135–145)
Total Bilirubin: 0.6 mg/dL (ref 0.3–1.2)
Total Protein: 6.7 g/dL (ref 6.5–8.1)

## 2020-11-06 LAB — RESP PANEL BY RT-PCR (FLU A&B, COVID) ARPGX2
Influenza A by PCR: NEGATIVE
Influenza B by PCR: NEGATIVE
SARS Coronavirus 2 by RT PCR: NEGATIVE

## 2020-11-06 LAB — I-STAT BETA HCG BLOOD, ED (MC, WL, AP ONLY): I-stat hCG, quantitative: <5 m[IU]/mL (ref <5)

## 2020-11-06 LAB — ETHANOL: Alcohol, Ethyl (B): <10 mg/dL (ref <10)

## 2020-11-06 MED ORDER — TRAZODONE HCL 50 MG PO TABS: 50.0000 mg | ORAL_TABLET | Freq: Every day | ORAL | Status: DC

## 2020-11-06 MED ORDER — LORAZEPAM 2 MG/ML IJ SOLN
INTRAMUSCULAR | Status: AC
  Filled 2020-11-06: qty 1

## 2020-11-06 MED ORDER — ACETAMINOPHEN 650 MG RE SUPP: 650.0000 mg | Freq: Four times a day (QID) | RECTAL | Status: DC | PRN

## 2020-11-06 MED ORDER — HYDRALAZINE HCL 20 MG/ML IJ SOLN: 10.0000 mg | Freq: Three times a day (TID) | INTRAMUSCULAR | Status: DC | PRN

## 2020-11-06 MED ORDER — ENOXAPARIN SODIUM 30 MG/0.3ML IJ SOSY
30.0000 mg | PREFILLED_SYRINGE | INTRAMUSCULAR | Status: DC
  Administered 2020-11-08 – 2020-11-11 (×3): 30 mg via SUBCUTANEOUS
  Filled 2020-11-06 (×4): qty 0.3

## 2020-11-06 MED ORDER — ACETAMINOPHEN 325 MG PO TABS
650.0000 mg | ORAL_TABLET | Freq: Four times a day (QID) | ORAL | Status: DC | PRN
  Administered 2020-11-08 – 2020-11-11 (×4): 650 mg via ORAL
  Filled 2020-11-06 (×4): qty 2

## 2020-11-06 MED ORDER — SODIUM CHLORIDE 0.9 % IV SOLN: INTRAVENOUS | Status: DC

## 2020-11-06 MED ORDER — LORAZEPAM 1 MG PO TABS
1.0000 mg | ORAL_TABLET | Freq: Three times a day (TID) | ORAL | Status: DC
  Administered 2020-11-07: 1 mg via ORAL
  Filled 2020-11-06: qty 1

## 2020-11-06 MED ORDER — ONDANSETRON HCL 4 MG/2ML IJ SOLN: 4.0000 mg | Freq: Four times a day (QID) | INTRAMUSCULAR | Status: DC | PRN

## 2020-11-06 MED ORDER — DOCUSATE SODIUM 100 MG PO CAPS
100.0000 mg | ORAL_CAPSULE | Freq: Two times a day (BID) | ORAL | Status: DC
  Administered 2020-11-08 – 2020-11-12 (×8): 100 mg via ORAL
  Filled 2020-11-06 (×8): qty 1

## 2020-11-06 MED ORDER — ONDANSETRON HCL 4 MG PO TABS: 4.0000 mg | ORAL_TABLET | Freq: Four times a day (QID) | ORAL | Status: DC | PRN
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
## 2020-11-06 NOTE — ED Triage Notes (Addendum)
Pt BIB EMS due to a seizure. Pts boyfriend states he witnessed her "shaking" in the bathtub. When EMS arrived pt was axox4 but would close her eyes and not respond. When EMS sternal rubbed pt would scream. Pt responds to pain on arrival. VSS. Boyfriend at bedside. Pt does not have a hx of seizures.

## 2020-11-06 NOTE — ED Notes (Signed)
RN swabbed pt. Pt responded. Pt alert after covid swab

## 2020-11-06 NOTE — ED Notes (Signed)
Patient transported to CT 
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
## 2020-11-07 ENCOUNTER — Inpatient Hospital Stay (HOSPITAL_COMMUNITY): Payer: 59

## 2020-11-07 ENCOUNTER — Observation Stay (HOSPITAL_COMMUNITY): Payer: 59

## 2020-11-07 DIAGNOSIS — R569 Unspecified convulsions: Secondary | ICD-10-CM | POA: Diagnosis present

## 2020-11-07 DIAGNOSIS — Z91041 Radiographic dye allergy status: Secondary | ICD-10-CM | POA: Diagnosis not present

## 2020-11-07 DIAGNOSIS — Z23 Encounter for immunization: Secondary | ICD-10-CM | POA: Diagnosis present

## 2020-11-07 DIAGNOSIS — E0590 Thyrotoxicosis, unspecified without thyrotoxic crisis or storm: Secondary | ICD-10-CM

## 2020-11-07 DIAGNOSIS — N390 Urinary tract infection, site not specified: Secondary | ICD-10-CM | POA: Diagnosis present

## 2020-11-07 DIAGNOSIS — Z20822 Contact with and (suspected) exposure to covid-19: Secondary | ICD-10-CM | POA: Diagnosis present

## 2020-11-07 DIAGNOSIS — F22 Delusional disorders: Secondary | ICD-10-CM | POA: Diagnosis present

## 2020-11-07 DIAGNOSIS — F4310 Post-traumatic stress disorder, unspecified: Secondary | ICD-10-CM | POA: Diagnosis present

## 2020-11-07 DIAGNOSIS — E042 Nontoxic multinodular goiter: Secondary | ICD-10-CM | POA: Diagnosis present

## 2020-11-07 DIAGNOSIS — Z8619 Personal history of other infectious and parasitic diseases: Secondary | ICD-10-CM | POA: Diagnosis not present

## 2020-11-07 DIAGNOSIS — E039 Hypothyroidism, unspecified: Secondary | ICD-10-CM | POA: Diagnosis present

## 2020-11-07 DIAGNOSIS — F410 Panic disorder [episodic paroxysmal anxiety] without agoraphobia: Secondary | ICD-10-CM | POA: Diagnosis present

## 2020-11-07 DIAGNOSIS — E739 Lactose intolerance, unspecified: Secondary | ICD-10-CM | POA: Diagnosis present

## 2020-11-07 DIAGNOSIS — Z79899 Other long term (current) drug therapy: Secondary | ICD-10-CM | POA: Diagnosis not present

## 2020-11-07 DIAGNOSIS — I4892 Unspecified atrial flutter: Secondary | ICD-10-CM | POA: Diagnosis present

## 2020-11-07 DIAGNOSIS — F319 Bipolar disorder, unspecified: Secondary | ICD-10-CM

## 2020-11-07 DIAGNOSIS — G40909 Epilepsy, unspecified, not intractable, without status epilepticus: Secondary | ICD-10-CM | POA: Diagnosis present

## 2020-11-07 DIAGNOSIS — K219 Gastro-esophageal reflux disease without esophagitis: Secondary | ICD-10-CM | POA: Diagnosis present

## 2020-11-07 DIAGNOSIS — I440 Atrioventricular block, first degree: Secondary | ICD-10-CM | POA: Diagnosis present

## 2020-11-07 DIAGNOSIS — Z818 Family history of other mental and behavioral disorders: Secondary | ICD-10-CM | POA: Diagnosis not present

## 2020-11-07 DIAGNOSIS — Z880 Allergy status to penicillin: Secondary | ICD-10-CM | POA: Diagnosis not present

## 2020-11-07 DIAGNOSIS — F17210 Nicotine dependence, cigarettes, uncomplicated: Secondary | ICD-10-CM | POA: Diagnosis present

## 2020-11-07 LAB — CBC
HCT: 38.9 % (ref 36.0–46.0)
Hemoglobin: 12.8 g/dL (ref 12.0–15.0)
MCH: 28.5 pg (ref 26.0–34.0)
MCHC: 32.9 g/dL (ref 30.0–36.0)
MCV: 86.6 fL (ref 80.0–100.0)
Platelets: 291 10*3/uL (ref 150–400)
RBC: 4.49 MIL/uL (ref 3.87–5.11)
RDW: 12.7 % (ref 11.5–15.5)
WBC: 11.3 10*3/uL — ABNORMAL HIGH (ref 4.0–10.5)
nRBC: 0 % (ref 0.0–0.2)

## 2020-11-07 LAB — T4, FREE: Free T4: 1.3 ng/dL — ABNORMAL HIGH (ref 0.61–1.12)

## 2020-11-07 LAB — COMPREHENSIVE METABOLIC PANEL
ALT: 15 U/L (ref 0–44)
AST: 13 U/L — ABNORMAL LOW (ref 15–41)
Albumin: 3.5 g/dL (ref 3.5–5.0)
Alkaline Phosphatase: 58 U/L (ref 38–126)
Anion gap: 6 (ref 5–15)
BUN: 9 mg/dL (ref 6–20)
CO2: 25 mmol/L (ref 22–32)
Calcium: 8.4 mg/dL — ABNORMAL LOW (ref 8.9–10.3)
Chloride: 106 mmol/L (ref 98–111)
Creatinine, Ser: 0.72 mg/dL (ref 0.44–1.00)
GFR, Estimated: >60 mL/min (ref >60)
Glucose, Bld: 95 mg/dL (ref 70–99)
Potassium: 3.6 mmol/L (ref 3.5–5.1)
Sodium: 137 mmol/L (ref 135–145)
Total Bilirubin: 0.5 mg/dL (ref 0.3–1.2)
Total Protein: 6.4 g/dL — ABNORMAL LOW (ref 6.5–8.1)

## 2020-11-07 LAB — TSH: TSH: <0.01 u[IU]/mL — ABNORMAL LOW (ref 0.350–4.500)

## 2020-11-07 LAB — HIV ANTIBODY (ROUTINE TESTING W REFLEX): HIV Screen 4th Generation wRfx: NONREACTIVE

## 2020-11-07 MED ORDER — HALOPERIDOL LACTATE 5 MG/ML IJ SOLN: 0.5000 mg | Freq: Once | INTRAMUSCULAR | Status: DC

## 2020-11-07 MED ORDER — INFLUENZA VAC SPLIT QUAD 0.5 ML IM SUSY
0.5000 mL | PREFILLED_SYRINGE | INTRAMUSCULAR | Status: AC
  Administered 2020-11-08: 0.5 mL via INTRAMUSCULAR
  Filled 2020-11-07: qty 0.5

## 2020-11-07 MED ORDER — LORAZEPAM 1 MG PO TABS
1.0000 mg | ORAL_TABLET | Freq: Four times a day (QID) | ORAL | Status: DC | PRN
  Administered 2020-11-08 – 2020-11-09 (×2): 1 mg via ORAL
  Filled 2020-11-07 (×3): qty 1

## 2020-11-07 MED ORDER — HALOPERIDOL LACTATE 5 MG/ML IJ SOLN
2.0000 mg | Freq: Once | INTRAMUSCULAR | Status: AC
  Administered 2020-11-07: 2 mg via INTRAMUSCULAR
  Filled 2020-11-07: qty 1
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
## 2020-11-07 NOTE — Progress Notes (Signed)
LTM EEG hooked up and running - no initial skin breakdown - push button tested - neuro notified. [facility] monitoring.  Technically difficult hookup: Pt was combative and yelling during hookup. Husband at bedside for assistance.

## 2020-11-07 NOTE — Procedures (Signed)
Patient Name: Veronica Perez  MRN: [id number]  Epilepsy Attending: Lora Havens  Referring Physician/Provider: Loni Beckwith, PA-C Date: 11/07/2020 Duration: 23.12 mins  Patient history: 45 y.o. female with a PMHx of bipolar disorder, depression, panic attacks, thyroid disease and tobacco dependence who presented to the ED via EMS early this afternoon for assessment after she had a seizure-like spell at home. She had reported to her boyfriend that she was going to have a seizure, so she was moved to the bathtub. Her boyfriend then witnessed her begin to have what appeared to be a syncopal event followed by "shaking" in the bathtub and called EMS. The shaking lasted for 3-5 minutes and occurred at about 1130. Ellene Route reported that upon waking the patient knew who she was and where she was but quickly fell back asleep and had been like that ever since.  EEG to evaluate for seizures.  Level of alertness: Awake  AEDs during EEG study: Ativan  Technical aspects: This EEG study was done with scalp electrodes positioned according to the 10-20 International system of electrode placement. Electrical activity was acquired at a sampling rate of 500Hz  and reviewed with a high frequency filter of 70Hz  and a low frequency filter of 1Hz . EEG data were recorded continuously and digitally stored.   Description: EEG showed an excessive amount of 15 to 18 Hz beta activity distributed symmetrically and diffusely. Hyperventilation and photic stimulation were not performed.     ABNORMALITY - Excessive beta, generalized  IMPRESSION: This study is within normal limits. The excessive beta activity seen in the background is most likely due to the effect of benzodiazepine and is a benign EEG pattern. No seizures or epileptiform discharges were seen throughout the recording.  Zhara Gieske Barbra Sarks

## 2020-11-07 NOTE — Progress Notes (Signed)
EEG completed, results pending. 

## 2020-11-07 NOTE — ED Notes (Signed)
MD states pt can have food after she passes a dysphagia screen and has to have someone monitor her while eating for aspiration. Attempted to have pt drink water from a cup and pt refused to speak afterwards on multiple attempts to get her to talk, she would not. Pt informed that if she didn't speak or answer questions she would remain NPO and pt continued not to speak. No noted swallowing issues. Pt left as NPO at this time. Will continue to monitor.

## 2020-11-07 NOTE — Progress Notes (Signed)
Patient transferred from ED to [phone]. Patient is alert and oriented to person, place, time, and situation. Telemetry monitoring enabled and vital signs taken. Skin checked with Darrold Span. Fall precautions initiated. Patient bed in the locked, lowest position. Non-slip socks in place and bed alarm on. Call bell is within reach. Patient knows to call for assistance prior to getting up and patient demonstrates use. Patient is laying comfortably in bed.

## 2020-11-07 NOTE — Progress Notes (Signed)
PROGRESS NOTE    Veronica Perez  QQV:[id number] DOB: [birth year] DOA: 11/06/2020 PCP: Pcp, No   Brief Narrative: 45 year old with past medical history significant for bipolar disorder, depression, panic attacks, PTSD, tobacco abuse presented to the ED with complaints of reported seizure.  Per fianc patient had generalized body shaking which lasted longer.  He found her in the bathroom and in the bathtub with generalized body shaking that lasted 3 to 5 minutes.,  He reported a urine incontinence episode.  After the episode she was awake and aware where she was.  She then went back to sleep.   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 Patient remove wirer.  Plan for MRI.  Patient lack capacity, family will need to help patient for medical decision. Per psych patient does not need to be IVC for psychiatric reasons.   Hyperthyroidism:  History of Low TSH: Follow up in the past by Dr Renato Shin for hyperthyroidism.  Diagnosed 2018 [place] 2018 showed multinodular goiter.  Currently patient does not have sign of thyroid storm, no hypothermia, tachycardia. She does have anxiety/  -Discussed with Endocrinologist Dr Buddy Duty with free T 3 at 1.3 patient thats probably some underline psych problems, uncontrol thyroid could exacerbate psych problems. He could see patient office 10/11 at 10; am.  I will dicussed with patient and family in regard start treatment with methimazole.    Bipolar disorder/PTSD/anxiety Been taking any medication.  We will change lorazepam to as needed.  EKG reading with a flutter 321 AV block: EKG reviewed by cardiology likely artifact.    Estimated body mass index is 30.55 kg/m as  calculated from the following:   Height as of 10/19/20: 5\' 4"  (1.626 m).   Weight as of 10/19/20: 80.7 kg.   DVT prophylaxis: Lovenox Code Status: full code Family Communication: fiancee and mother Disposition Plan:  Status is: Inpatient  Remains inpatient appropriate because:Inpatient level of care appropriate due to severity of illness  Dispo: The patient is from: Home              Anticipated d/c is to: Home              Patient currently is not medically stable to d/c.   Difficult to place patient No        Consultants:  Neurology Psych   Procedures:  EEG  Antimicrobials:    Subjective: Patient seen multiple times throughout the day today. My initial evaluation she was not answering a lot of questions.   Patient wanted to leave AMA, I came to bedside and patient was alert and oriented x3 , she wanted to go home with her mother.  She agreed to stay for psychiatric evaluation.   Saw patient again in the afternoon and she was sitting in the bed and at times she has pressured speech.  She then stand up and said being in the bed make me weak, I feel better if I am standing.   Significant other at bedside, explained to him that patient lacked capacity,  if she tried to leave the hospital  family will need to help her making that decision.  Ellene Route would like for her to get MRI and further thyroid test.  Patient  has agreed to stay.  I also discussed with patient's mother that patient will need assistance from family to leave from the hospital.     Objective: Vitals:   11/07/20 1000 11/07/20 1015 11/07/20 1045 11/07/20 1100  BP: 125/74   99/83  Pulse: 79 79 (!) 56 91  Resp:    15  Temp:      TempSrc:      SpO2: 99% 99% 98% 99%    Intake/Output Summary (Last 24 hours) at 11/07/2020 1659 Last data filed at 11/07/2020 0028 Gross per 24 hour  Intake 157.9 ml  Output 550 ml  Net -392.1 ml   There were no vitals filed for this visit.  Examination:  General exam:  Appears calm and comfortable  Respiratory system: Clear to auscultation. Respiratory effort normal. Cardiovascular system: S1 & S2 heard, RRR. No JVD, murmurs, rubs, gallops or clicks. No pedal edema. Gastrointestinal system: Abdomen is nondistended, soft and nontender. No organomegaly or masses felt. Normal bowel sounds heard. Central nervous system: Alert and oriented. Extremities: Symmetric 5 x 5 power. Skin: No rashes, lesions or ulcers     Data Reviewed: I have personally reviewed following labs and imaging studies  CBC: Recent Labs  Lab 11/06/20 1327 11/07/20 0522  WBC 9.5 11.3*  NEUTROABS 7.1  --   HGB 13.4 12.8  HCT 40.3 38.9  MCV 85.4 86.6  PLT 328 024   Basic Metabolic Panel: Recent Labs  Lab 11/06/20 1327 11/07/20 0522  NA 137 137  K 4.3 3.6  CL 107 106  CO2 23 25  GLUCOSE 102* 95  BUN 13 9  CREATININE 0.69 0.72  CALCIUM 8.8* 8.4*   GFR: CrCl cannot be calculated (Unknown ideal weight.). Liver Function Tests: Recent Labs  Lab 11/06/20 1327 11/07/20 0522  AST 13* 13*  ALT 14 15  ALKPHOS 69 58  BILITOT 0.6 0.5  PROT 6.7 6.4*  ALBUMIN 3.7 3.5   No results for input(s): LIPASE, AMYLASE in the last 168 hours. No results for input(s): AMMONIA in the last 168 hours. Coagulation Profile: No results for input(s): INR, PROTIME in the last 168 hours. Cardiac Enzymes: No results for input(s): CKTOTAL, CKMB, CKMBINDEX, TROPONINI in the last 168 hours. BNP (last 3 results) No results for input(s): PROBNP in the last 8760 hours. HbA1C: No results for input(s): HGBA1C in the last 72 hours. CBG: No results for input(s): GLUCAP in the last 168 hours. Lipid Profile: No results for input(s): CHOL, HDL, LDLCALC, TRIG, CHOLHDL, LDLDIRECT in the last 72 hours. Thyroid Function Tests: No results for input(s): TSH, T4TOTAL, FREET4, T3FREE, THYROIDAB in the last 72 hours. Anemia Panel: No results for input(s): VITAMINB12, FOLATE, FERRITIN, TIBC, IRON, RETICCTPCT  in the last 72 hours. Sepsis Labs: No results for input(s): PROCALCITON, LATICACIDVEN in the last 168 hours.  Recent Results (from the past 240 hour(s))  Resp Panel by RT-PCR (Flu A&B, Covid) Nasopharyngeal Swab     Status: None   Collection Time: 11/06/20  5:01 PM   Specimen: Nasopharyngeal Swab; Nasopharyngeal(NP) swabs in vial transport medium  Result Value Ref Range Status   SARS Coronavirus 2 by RT PCR NEGATIVE NEGATIVE Final    Comment: (NOTE) SARS-CoV-2 target nucleic acids are NOT DETECTED.  The SARS-CoV-2 RNA is generally detectable in upper respiratory specimens during the acute phase of infection. The lowest concentration of SARS-CoV-2 viral copies this assay can detect is 138 copies/mL. A negative result does not preclude SARS-Cov-2 infection and should not be used as  the sole basis for treatment or other patient management decisions. A negative result may occur with  improper specimen collection/handling, submission of specimen other than nasopharyngeal swab, presence of viral mutation(s) within the areas targeted by this assay, and inadequate number of viral copies(<138 copies/mL). A negative result must be combined with clinical observations, patient history, and epidemiological information. The expected result is Negative.  Fact Sheet for Patients:  EntrepreneurPulse.com.au  Fact Sheet for Healthcare Providers:  IncredibleEmployment.be  This test is no t yet approved or cleared by the [place] FDA and  has been authorized for detection and/or diagnosis of SARS-CoV-2 by FDA under an Emergency Use Authorization (EUA). This EUA will remain  in effect (meaning this test can be used) for the duration of the COVID-19 declaration under Section 564(b)(1) of the Act, 21 U.S.C.section 360bbb-3(b)(1), unless the authorization is terminated  or revoked sooner.       Influenza A by PCR NEGATIVE NEGATIVE Final   Influenza B by PCR  NEGATIVE NEGATIVE Final    Comment: (NOTE) The Xpert Xpress SARS-CoV-2/FLU/RSV plus assay is intended as an aid in the diagnosis of influenza from Nasopharyngeal swab specimens and should not be used as a sole basis for treatment. Nasal washings and aspirates are unacceptable for Xpert Xpress SARS-CoV-2/FLU/RSV testing.  Fact Sheet for Patients: EntrepreneurPulse.com.au  Fact Sheet for Healthcare Providers: IncredibleEmployment.be  This test is not yet approved or cleared by the [place] FDA and has been authorized for detection and/or diagnosis of SARS-CoV-2 by FDA under an Emergency Use Authorization (EUA). This EUA will remain in effect (meaning this test can be used) for the duration of the COVID-19 declaration under Section 564(b)(1) of the Act, 21 U.S.C. section 360bbb-3(b)(1), unless the authorization is terminated or revoked.  Performed at [facility], [place] [address] [place], [place] [address]          Radiology Studies: CT HEAD WO CONTRAST (5MM)  Result Date: 11/06/2020 CLINICAL DATA:  Seizure, nontraumatic. EXAM: CT HEAD WITHOUT CONTRAST TECHNIQUE: Contiguous axial images were obtained from the base of the skull through the vertex without intravenous contrast. COMPARISON:  09/24/2020 FINDINGS: Brain: No evidence of acute infarction, hemorrhage, hydrocephalus, extra-axial collection or mass lesion/mass effect. Vascular: No hyperdense vessel or unexpected calcification. Skull: Normal. Negative for fracture or focal lesion. Sinuses/Orbits: No acute finding. Other: None. IMPRESSION: No acute intracranial abnormalities. Normal brain. Electronically Signed   By: Kerby Moors M.D.   On: 11/06/2020 15:26   EEG adult  Result Date: 11/07/2020 Lora Havens, MD     11/07/2020 10:55 AM Patient Name: ZINIA INNOCENT MRN: [id number] Epilepsy Attending: Lora Havens Referring Physician/Provider: Loni Beckwith, PA-C Date:  11/07/2020 Duration: 23.12 mins Patient history: 45 y.o. female with a PMHx of bipolar disorder, depression, panic attacks, thyroid disease and tobacco dependence who presented to the ED via EMS early this afternoon for assessment after she had a seizure-like spell at home. She had reported to her boyfriend that she was going to have a seizure, so she was moved to the bathtub. Her boyfriend then witnessed her begin to have what appeared to be a syncopal event followed by "shaking" in the bathtub and called EMS. The shaking lasted for 3-5 minutes and occurred at about 1130. Ellene Route reported that upon waking the patient knew who she was and where she was but quickly fell back asleep and had been like that ever since.  EEG to evaluate for seizures. Level of alertness: Awake AEDs  during EEG study: Ativan Technical aspects: This EEG study was done with scalp electrodes positioned according to the 10-20 International system of electrode placement. Electrical activity was acquired at a sampling rate of 500Hz  and reviewed with a high frequency filter of 70Hz  and a low frequency filter of 1Hz . EEG data were recorded continuously and digitally stored. Description: EEG showed an excessive amount of 15 to 18 Hz beta activity distributed symmetrically and diffusely. Hyperventilation and photic stimulation were not performed.   ABNORMALITY - Excessive beta, generalized IMPRESSION: This study is within normal limits. The excessive beta activity seen in the background is most likely due to the effect of benzodiazepine and is a benign EEG pattern. No seizures or epileptiform discharges were seen throughout the recording. Priyanka Barbra Sarks        Scheduled Meds:  docusate sodium  100 mg Oral BID   enoxaparin (LOVENOX) injection  30 mg Subcutaneous Q24H   haloperidol lactate  0.5 mg Intravenous Once   Continuous Infusions:  sodium chloride Stopped (11/07/20 1128)     LOS: 0 days    Time spent: 35 minutes.     Elmarie Shiley, MD [facility]   If 7PM-7AM, please contact night-coverage [email or web address]  11/07/2020, 4:59 PM

## 2020-11-07 NOTE — ED Notes (Signed)
Patient shouting at EEG tech and her fiance statements such as: "You're trying to kill me", "Why are you permanently gluing these probes to my head?! I don't want them on my head for the rest of my life","They're trying to kill me mama!", and shouting that the chloraprep sponge was poison and rubbing it on her skin was an attempt to poison her. Attending paged regarding patient's behavior and mental status.

## 2020-11-07 NOTE — ED Notes (Signed)
After passing swallow screen, patient given [place] sandwich and cranberry juice per patient's request.

## 2020-11-07 NOTE — Progress Notes (Addendum)
   11/07/20 1200  Clinical Encounter Type  Visited With Family;Patient and family together;Health care provider  Visit Type Initial;Psychological support;Spiritual support;Social support;ED  Referral From [facility] paged by ED waiting room staff to provide support to pt.'s fiance in lobby; fiance [name] shared that pt. suffered what seemed to him to be a panic attack and seizure last night.  He attempted to explain that pt. has a number of underlying stressors and traumas which he believes are relevant to her current state of agitation and to the events leading to this admission to ED.  [place] returned to pt.'s room w/fiance to assess pt.'s needs.  Pt. lying in bed having just finished EEG; she expressed sense of injury in having EEG leads "glued" to her head rather than affixed to a hat on her head; pt. says she wants to go home to her own bed.  Pt. seemed agitated and suspicious of [facility], fiance, and staff.  [place] excused himself when psychiatrist arrived; pt. requested food and [place] relayed this request to RN.

## 2020-11-07 NOTE — Progress Notes (Signed)
Neurology Progress Note  S: Patient is lying in bed. Not taking at first, then she will answer a few questions and close her eyes again.  Partner at bedside reports patient has not taken any of her medications for months including her psychiatric medications Patient exhibits some flight of ideas and significant paranoia about EEG testing  O: Current vital signs: BP 104/83   Pulse 68   Temp 97.7 F (36.5 C) (Oral)   Resp 20   SpO2 99%  Vital signs in last 24 hours: Temp:  [97.7 F (36.5 C)] 97.7 F (36.5 C) (10/04 1312) Pulse Rate:  [63-102] 68 (10/05 0630) Resp:  [10-27] 20 (10/05 0630) BP: (94-156)/(49-108) 104/83 (10/05 0630) SpO2:  [98 %-100 %] 99 % (10/05 0630)  GENERAL: Well appearing female in NAD. Is awake and talking in one word answers, then close her eyes and doesn't participate in some parts of exam not talk.  HEENT: Normocephalic and atraumatic. LUNGS: Normal respiratory effort.  CV: RRR on tele.  Ext: warm.  NEURO:  Mental Status: Alert after NP aroused her but closes eyes again. Patient will not answer orientation questions.   Speech/Language: speech is without aphasia or dysarthria.   Patient's comprehension is intact due to following some commands. Speech is only a whisper and one work answers. Patient say "owwww" during parts of exam.   Much more verbal during later attending examination, stating that electrodes will be permanently glued to her head, that her partner at bedside has been trying to harm her, though she is generally redirectable.  Cranial Nerves:  II: PERRL.  III, IV, VI:  Eyelids elevate symmetrically.  V: Sensation is intact to light touch and symmetrical to face.  VII: Face is symmetrical at rest.   VIII: hearing intact to voice. IX, X: Palate elevates symmetrically. Phonation is normal.  HR:CBULAGTX shrug 5/5. XII: tongue is midline without fasciculations. Motor:  5/5 UEs 5/5 LEs. Tone: is normal and bulk is normal. Sensation- Intact  to light touch bilaterally.  Coordination: Will not perform.  Gait- deferred.  Medications  Current Facility-Administered Medications:    0.9 %  sodium chloride infusion, , Intravenous, Continuous, Shawna Clamp, MD, Last Rate: 75 mL/hr at 11/07/20 0951, Rate Verify at 11/07/20 0951   acetaminophen (TYLENOL) tablet 650 mg, 650 mg, Oral, Q6H PRN **OR** acetaminophen (TYLENOL) suppository 650 mg, 650 mg, Rectal, Q6H PRN, Shawna Clamp, MD   docusate sodium (COLACE) capsule 100 mg, 100 mg, Oral, BID, Shawna Clamp, MD   enoxaparin (LOVENOX) injection 30 mg, 30 mg, Subcutaneous, Q24H, Shawna Clamp, MD   hydrALAZINE (APRESOLINE) injection 10 mg, 10 mg, Intravenous, Q8H PRN, Shawna Clamp, MD   LORazepam (ATIVAN) tablet 1 mg, 1 mg, Oral, TID, Shawna Clamp, MD   ondansetron (ZOFRAN) tablet 4 mg, 4 mg, Oral, Q6H PRN **OR** ondansetron (ZOFRAN) injection 4 mg, 4 mg, Intravenous, Q6H PRN, Shawna Clamp, MD  Current Outpatient Medications:    LATUDA 120 MG TABS, Take 120 mg by mouth at bedtime. (Patient not taking: Reported on 11/06/2020), Disp: , Rfl:    LORazepam (ATIVAN) 1 MG tablet, Take 1 mg by mouth 3 (three) times daily. (Patient not taking: No sig reported), Disp: , Rfl:    traZODone (DESYREL) 50 MG tablet, Take 50 mg by mouth at bedtime. (Patient not taking: No sig reported), Disp: , Rfl:   Pertinent Labs HIV NR. UDS negative.   Lab Results  Component Value Date   TSH <0.010 (L) 09/03/2020   T3TOTAL 198 (H)  09/17/2016   T4TOTAL 16.0 (H) 06/10/2016     No new imaging   EEG: This study is within normal limits. The excessive beta activity seen in the background is most likely due to the effect of benzodiazepine and is a benign EEG pattern. No seizures or epileptiform discharges were seen throughout the recording.  Assessment: 45 yo female with with negative workup thus far for seizure that she had at home. EEG negative. CTH negative.  Of note, further review of her chart  reveals longstanding untreated hypothyroidism, which does put her at risk of neurological manifestations such as myxedema coma.  Unfortunately only a time-limited EEG was able to be obtained before the patient self discontinued her leads secondary to her psychosis.  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   Recommendations/Plan:  -f/up cEEG.  -If EEG is negative, patient can likely be discharge with perhaps therapy f/up -Appreciate psychiatry input; agree she does not appear to have capacity to make medical decisions for herself at this point -Hypothyroid management per primary team, would consider endocrine consult given the severity and longstanding nature of her hypothyroidism and the significant risks this can carry -Neurology will be available on an as-needed basis, please reach out if new questions or concerns arise  Pt seen by Clance Boll, MSN, APN-BC/Nurse Practitioner/Neuro and later by MD. Note and plan to be edited as needed by MD.  Pager: [phone]  Attending Neurologist's note:  I personally saw this patient, gathering history, performing a full neurologic examination, reviewing relevant labs, personally reviewing relevant imaging including head CT, and formulated the assessment and plan, adding the note above for completeness and clarity to accurately reflect my thoughts  Greater than 35 minutes were spent in care of this patient today, greater than 50% of bedside

## 2020-11-07 NOTE — ED Notes (Signed)
Patient calm and more cooperative with interventions. Continues to refuse IV and EEG.

## 2020-11-07 NOTE — ED Notes (Signed)
Patient removed monitoring equipment and dressed herself to leave.

## 2020-11-07 NOTE — ED Notes (Signed)
Pt asking for food and water. Pt currently NPO, MD paged for order changed. Will continue to monitor.

## 2020-11-08 ENCOUNTER — Encounter (HOSPITAL_COMMUNITY): Payer: Self-pay | Admitting: Family Medicine

## 2020-11-08 DIAGNOSIS — F319 Bipolar disorder, unspecified: Secondary | ICD-10-CM | POA: Diagnosis not present

## 2020-11-08 DIAGNOSIS — E0590 Thyrotoxicosis, unspecified without thyrotoxic crisis or storm: Secondary | ICD-10-CM | POA: Diagnosis not present

## 2020-11-08 DIAGNOSIS — F4310 Post-traumatic stress disorder, unspecified: Secondary | ICD-10-CM | POA: Diagnosis not present

## 2020-11-08 DIAGNOSIS — R569 Unspecified convulsions: Secondary | ICD-10-CM

## 2020-11-08 LAB — URINALYSIS, ROUTINE W REFLEX MICROSCOPIC
Bilirubin Urine: NEGATIVE
Glucose, UA: NEGATIVE mg/dL
Hgb urine dipstick: NEGATIVE
Ketones, ur: NEGATIVE mg/dL
Nitrite: NEGATIVE
Protein, ur: NEGATIVE mg/dL
Specific Gravity, Urine: 1.019 (ref 1.005–1.030)
WBC, UA: >50 WBC/hpf — ABNORMAL HIGH (ref 0–5)
pH: 7 (ref 5.0–8.0)

## 2020-11-08 LAB — PREGNANCY, URINE: Preg Test, Ur: NEGATIVE

## 2020-11-08 LAB — T3, FREE: T3, Free: 3.9 pg/mL (ref 2.0–4.4)

## 2020-11-08 MED ORDER — SULFAMETHOXAZOLE-TRIMETHOPRIM 800-160 MG PO TABS
1.0000 | ORAL_TABLET | Freq: Two times a day (BID) | ORAL | Status: DC
  Administered 2020-11-08 – 2020-11-09 (×3): 1 via ORAL
  Filled 2020-11-08 (×5): qty 1

## 2020-11-08 MED ORDER — ARIPIPRAZOLE 5 MG PO TABS
5.0000 mg | ORAL_TABLET | Freq: Every day | ORAL | Status: DC
  Administered 2020-11-08 – 2020-11-09 (×2): 5 mg via ORAL
  Filled 2020-11-08 (×2): qty 1

## 2020-11-08 NOTE — Consult Note (Signed)
[facility] New Psychiatric Evaluation   Service Date: November 08, 2020 LOS:  LOS: 1 day    Assessment  Veronica Perez is a 45 y.o. female admitted medically for 11/06/2020  1:00 PM for seizurelike activity. She carries the psychiatric diagnoses of bipolar disorder and has a past medical history of  uncontrolled hyperthyroidism. Psychiatry was consulted for agitation, capacity to leave AMA, and need for inpt psych hospitalization by Veronica Hummer, MD.    Her current presentation of vague paranoia, poor sleep and worsening anxiety is most consistent with known diagnosis of bipolar disease complicated by uncontrolled hyperthyroidism although first hospitalization seems to predate diagnosis of hyperthyroidism. Current outpatient psychotropic medications include latuda and historically she has had a fair response to these medications; she is not compliant with any psychotropic medication. On initial examination, patient answered my questions appropriately and denied any thoughts of self-harm or intent to harm others; her fiancee and mother . She does not meet criteria for involuntary psychiatric hospitalization in the state of [place]. Although she has some vague paranoia and some psychotic thought processes and likely does not have capacity to leave AMA, fiancee (most likely surrogate decision maker) is also against keeping pt in hospital against her will. At the time I saw the pt, neurology stated that they had no further inpatient neurological workup. I have reviewed past several psychiatric notes; while patient is not at her baseline she does not seem as acutely ill psychiatrically as in prior presentations.  I do think she is at high risk of decompensating to the point that she requires involuntary treatment however (as above) she does not meet criteria for IVC In [place] as she is not an immediate threat to herself or others.   10/6: asked to see patient again as pt interested in  talking about medications. Discussed abilify with option to bridge to LAI, used its use in ADHD/depression to help get pt and fiancee buy in. Pt open to taking aripiprazole; fiancee hopeful this will bridge to LAI.   Diagnoses:  Active Hospital problems: Principal Problem:   Seizure disorder [facility]) Active Problems:   PANIC ATTACKS   TOBACCO DEPENDENCE   Gastroesophageal reflux disease   Bipolar I disorder (HCC)   PTSD (post-traumatic stress disorder)   Hyperthyroidism    Problems edited/added by me: No problems updated.  Plan  ## Safety and Observation Level:  - Based on my clinical evaluation, I estimate the patient to be at low risk of self harm in the current setting - At this time, we recommend a routine level of observation. This decision is based on my review of the chart including patient's history and current presentation, interview of the patient, mental status examination, and consideration of suicide risk including evaluating suicidal ideation, plan, intent, suicidal or self-harm behaviors, risk factors, and protective factors. This judgment is based on our ability to directly address suicide risk, implement suicide prevention strategies and develop a safety plan while the patient is in the clinical setting. Please contact our team if there is a concern that risk level has changed.  ## Medications:  -- s aripiprazole 5 mg qAM  - would generally recommend pt be offered medication with option to transition to LAI such as aripiprazole (would be able to get buy in from pt and fiancee due to being part of ADHD algorithm)  ## Medical Decision Making Capacity:  -- Patient did not have capacity to leave AMA (unable to clearly and coherently state reasons  to leave, perseverated on nutrition). However, stated wishes (leave without forced treatment) are in line with her fiancee and mother's opinion (generally to avoid forced treatment, discussed more in depth with fiancee than mother --  per neurology, minimal inpt workup remaining for seizures -- she does not meet criteria for IVC for psychiatric reasons in the state of [place]  ## Further Work-up:  -- outpt f/u with psychiatry (please put in information for walk-in hours at [facility]) -- needs outpt f/u with endocrinology (non compliant with methimazole, uncontrolled hyperthyroidism contributing to symptoms)  ## Disposition:  -- likely home with fiancee   ##Legal Status -- voluntary   Thank you for this consult request. Recommendations have been communicated to the primary team.  We will sign off at this time.   [place] A Veronica Perez    NEW history  Relevant Aspects of Hospital Course:  Admitted on 11/06/2020 for seizurelike activity. Has been minimally compliant (ripped off EEG leads due to concerns over glue poisoning her).   After seeing yesterday, TSH came back low with elevated T3/T4.   Patient Report:  Patient seen in early afternoon at request of hospital team. Discussed r/b/se of aripiprazole at length incl metabolic risks and akathisia; agreeable to try this medication. Discussed bad side effects  She is apprehensive about trying medication but ultimately agrees to try it. Feels lorazepam is best med for her issues - discussed this is not possible long term.   No SI/HI/AH/VH.   ROS:  Generally (+) only for pain from accident today.   No chest pain,   Collateral information:  None obtained today; fiancee at bedside  Psychiatric History:  Information collected from pt, medical record Psych hospitalizations in 2014, 2021 and 2022 - mostly for mania On numerous antipsychotics, mood stabilizers, etc Generally not compliant with meds.   Family psych history: per chart review, bipolar disorder in mother.   Medical History: Past Medical History:  Diagnosis Date   Bipolar disorder ([place])    Depression    Former smoker, stopped smoking many years ago    HSV (herpes simplex virus) infection     Hypothyroid    Low TSH level 05/30/2016   Panic attacks    Thyroid disease     Surgical History: Past Surgical History:  Procedure Laterality Date   ADENOIDECTOMY     OVARIAN CYST SURGERY     TONSILLECTOMY      Medications:   Current Facility-Administered Medications:    0.9 %  sodium chloride infusion, , Intravenous, Continuous, Shawna Clamp, MD, Stopped at 11/07/20 1128   acetaminophen (TYLENOL) tablet 650 mg, 650 mg, Oral, Q6H PRN **OR** acetaminophen (TYLENOL) suppository 650 mg, 650 mg, Rectal, Q6H PRN, Shawna Clamp, MD   ARIPiprazole (ABILIFY) tablet 5 mg, 5 mg, Oral, Daily, Nusaiba Guallpa A, 5 mg at 11/08/20 1537   docusate sodium (COLACE) capsule 100 mg, 100 mg, Oral, BID, Shawna Clamp, MD, 100 mg at 11/08/20 0856   enoxaparin (LOVENOX) injection 30 mg, 30 mg, Subcutaneous, Q24H, Shawna Clamp, MD, 30 mg at 11/08/20 0856   haloperidol lactate (HALDOL) injection 0.5 mg, 0.5 mg, Intravenous, Once, Regalado, Belkys A, MD   hydrALAZINE (APRESOLINE) injection 10 mg, 10 mg, Intravenous, Q8H PRN, Shawna Clamp, MD   LORazepam (ATIVAN) tablet 1 mg, 1 mg, Oral, Q6H PRN, Regalado, Belkys A, MD, 1 mg at 11/08/20 0019   ondansetron (ZOFRAN) tablet 4 mg, 4 mg, Oral, Q6H PRN **OR** ondansetron (ZOFRAN) injection 4 mg, 4 mg, Intravenous, Q6H PRN, Dwyane Dee,  Pardeep, MD   sulfamethoxazole-trimethoprim (BACTRIM DS) 800-160 MG per tablet 1 tablet, 1 tablet, Oral, Q12H, Chotiner, Yevonne Aline, MD, 1 tablet at 11/08/20 0415  Allergies: Allergies  Allergen Reactions   Penicillins Hives, Itching and Nausea And Vomiting    Has patient had a PCN reaction causing immediate rash, facial/tongue/throat swelling, SOB or lightheadedness with hypotension: No Has patient had a PCN reaction causing severe rash involving mucus membranes or skin necrosis: No Has patient had a PCN reaction that required hospitalization: No Has patient had a PCN reaction occurring within the last 10 years: No If all of  the above answers are "NO", then may proceed with Cephalosporin use.   Iodine    Lactose Intolerance (Gi)    Penicillins Hives and Nausea And Vomiting    Social History:  Lives with fiancee Has 67ish y/o daughter  Tobacco use: denies - quit a couple of months ago. Not using nicotine replacement outside of the hospital Alcohol use: denies Drug use: denies  Family History:  The patient's family history includes Bipolar disorder in her mother; Cancer in her maternal grandmother; Diabetes in her father; Heart disease in her father.    Objective  Vital signs:  Temp:  [97.6 F (36.4 C)-99 F (37.2 C)] 98.7 F (37.1 C) (10/06 1620) Pulse Rate:  [84-100] 84 (10/06 1620) Resp:  [16-20] 19 (10/06 1620) BP: (108-131)/(56-103) 108/79 (10/06 1620) SpO2:  [95 %-100 %] 98 % (10/06 1620) Weight:  [77.3 kg] 77.3 kg (10/05 2217)  Physical Exam: Gen: Alert, oriented, in no acute distress.  Neuro: CNII-XII grossly intact.Did not volitionally fall asleep. Pacing throughout interview Pulm: no increased work of breathing Skin: no rash noted  Mental Status Exam: Appearance: Awake, pacing  Attitude:  Generally cooperative, good eye contact, takes breaks for personal grooming or curling hair  Behavior/Psychomotor: Mild tremor noted  Speech/Language:  Soft, normal amount  Mood: "OK"  Affect: distracted  Thought process: circumstantial  Thought content:   (+) vague paranoia, devoid of SI/HI  Perceptual disturbances:  Denies AH/VH, not overtly RIS  Attention: Much improved   Concentration: Adequate to conversation, chronically poor (ADHD), easily overwhelmed  Orientation: Oriented x3, some difficulty w/ situation  Memory: Recent/remote intact  Fund of knowledge:  Not formally assessed  Insight:   poor  Judgment:  poor  Impulse Control: poor     Data Reviewed: Chart

## 2020-11-08 NOTE — Procedures (Addendum)
Patient Name: Veronica Perez  MRN: [id number]  Epilepsy Attending: Lora Havens  Referring Physician/Provider: Dr Kerney Elbe Duration: 11/07/2020 3790 to 105/2022 1108   Patient history: 45 y.o. female with a PMHx of bipolar disorder, depression, panic attacks, thyroid disease and tobacco dependence who presented to the ED via EMS early this afternoon for assessment after she had a seizure-like spell at home. She had reported to her boyfriend that she was going to have a seizure, so she was moved to the bathtub. Her boyfriend then witnessed her begin to have what appeared to be a syncopal event followed by "shaking" in the bathtub and called EMS. The shaking lasted for 3-5 minutes and occurred at about 1130. Ellene Route reported that upon waking the patient knew who she was and where she was but quickly fell back asleep and had been like that ever since.  EEG to evaluate for seizures.   Level of alertness: Awake, asleep   AEDs during EEG study: Ativan   Technical aspects: This EEG study was done with scalp electrodes positioned according to the 10-20 International system of electrode placement. Electrical activity was acquired at a sampling rate of 500Hz  and reviewed with a high frequency filter of 70Hz  and a low frequency filter of 1Hz . EEG data were recorded continuously and digitally stored.    Description: EEG showed an excessive amount of 15 to 18 Hz beta activity distributed symmetrically and diffusely. Sleep was characterized by sleep spindles (12-14hz ), maximal frontocentral region. Hyperventilation and photic stimulation were not performed.     Patient pulled electrodes after 1108 and therefore study was not interpretable.    ABNORMALITY - Excessive beta, generalized   IMPRESSION: This study is within normal limits. The excessive beta activity seen in the background is most likely due to the effect of benzodiazepine and is a benign EEG pattern. No seizures or epileptiform discharges  were seen throughout the recording.   Nika Yazzie Barbra Sarks

## 2020-11-08 NOTE — TOC Initial Note (Signed)
Transition of Care [facility]) - Initial/Assessment Note    Patient Details  Name: Veronica Perez MRN: [id number] Date of Birth: [birth year]  Transition of [facility]) CM/SW Contact:    Verdell Carmine, RN Phone Number: 11/08/2020, 8:21 AM  Clinical Narrative:                  45 year old patient admitted with seizure, observed by SO. Patient was assessed by psychiatry, as she wanted to leave AMA< and the provider felt she did not have capacity to do so. Psychiatry agreed, however did not feel that she warranted a IVC or inpatient stay.  Patient has SO and mother for support. Follow up MD appointment made and in patient instructions.  DC to home with family  Expected Discharge Plan: Home/Self Care Barriers to Discharge: No Barriers Identified   Patient Goals and CMS Choice        Expected Discharge Plan and Services Expected Discharge Plan: Home/Self Care       Living arrangements for the past 2 months: Apartment                                      Prior Living Arrangements/Services Living arrangements for the past 2 months: Apartment Lives with:: Significant Other Patient language and need for interpreter reviewed:: Yes        Need for Family Participation in Patient Care: Yes (Comment) Care giver support system in place?: Yes (comment)   Criminal Activity/Legal Involvement Pertinent to Current Situation/Hospitalization: No - Comment as needed  Activities of Daily Living Home Assistive Devices/Equipment: None ADL Screening (condition at time of admission) Patient's cognitive ability adequate to safely complete daily activities?: Yes Is the patient deaf or have difficulty hearing?: No Does the patient have difficulty seeing, even when wearing glasses/contacts?: No Does the patient have difficulty concentrating, remembering, or making decisions?: Yes Patient able to express need for assistance with ADLs?: Yes Does the patient have difficulty dressing or  bathing?: Yes Independently performs ADLs?: Yes (appropriate for developmental age) Does the patient have difficulty walking or climbing stairs?: Yes Weakness of Legs: None Weakness of Arms/Hands: Left  Permission Sought/Granted                  Emotional Assessment       Orientation: : Oriented to Self Alcohol / Substance Use: Not Applicable Psych Involvement: Yes (comment)  Admission diagnosis:  Seizure disorder ([place]) [G40.909] Seizure-like activity ([place]) [R56.9] Patient Active Problem List   Diagnosis Date Noted   Seizure disorder ([place]) 11/06/2020   Abnormal uterine bleeding (AUB) 12/09/2018   Breast cancer screening 12/09/2018   Hyperthyroidism 11/24/2016   Bipolar disorder, current episode manic severe with psychotic features ([place]) 09/16/2016   PTSD (post-traumatic stress disorder) 09/16/2016   Panic disorder 09/16/2016   Heart palpitations 05/30/2016   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

## 2020-11-08 NOTE — Progress Notes (Signed)
PROGRESS NOTE    Veronica Perez  KDX:[id number] DOB: [birth year] DOA: 11/06/2020 PCP: Pcp, No   Brief Narrative: 45 year old with past medical history significant for bipolar disorder, depression, panic attacks, PTSD, tobacco abuse presented to the ED with complaints of reported seizure.  Per fianc patient had generalized body shaking which lasted longer.  He found her in the bathroom and in the bathtub with generalized body shaking that lasted 3 to 5 minutes.,  He reported a urine incontinence episode.  After the episode she was awake and aware where she was.  She then went back to sleep.   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 Patient remove wirer.  Plan for MRI.  Patient lack capacity, family will need to help patient for medical decision. Per psych patient does not need to be IVC for psychiatric reasons.  No further episodes.   Hyperthyroidism:  History of Low TSH: Follow up in the past by Dr Renato Shin for hyperthyroidism.  Diagnosed 2018 [place] 2018 showed multinodular goiter.  Currently patient does not have sign of thyroid storm, no hypothermia, tachycardia. She does have anxiety/  -Discussed with Endocrinologist Dr Buddy Duty with free T 3 at 1.3 patient thats probably some underline psych problems, uncontrol thyroid could exacerbate psych problems. He could see patient office 10/11 at 10; am.  Patient does not want to start methimazole. She is willing to follow up with Dr Buddy Duty out patient.  HR 80--100. Mostly 80. Monitor to consider BB.   Bipolar disorder/PTSD/anxiety She has not Been taking any medication.  We will change lorazepam to as needed. Consulted psych again, patient interested in  medication.   UTI:  Started on Bactrim.   EKG reading with a flutter 321 AV block: EKG reviewed by cardiology likely artifact.    Estimated body mass index is 29.25 kg/m as calculated from the following:   Height as of this encounter: 5\' 4"  (1.626 m).   Weight as of this encounter: 77.3 kg.   DVT prophylaxis: Lovenox Code Status: full code Family Communication: fiancee and mother Disposition Plan:  Status is: Inpatient  Remains inpatient appropriate because:Inpatient level of care appropriate due to severity of illness  Dispo: The patient is from: Home              Anticipated d/c is to: Home              Patient currently is not medically stable to d/c.   Difficult to place patient No        Consultants:  Neurology Psych   Procedures:  EEG  Antimicrobials:    Subjective: She does not wants to start methimazole. [name] was at bedside.  She is willing to follow with Dr Buddy Duty.  She and fiancee are interested in medications for psych issues.  No tremors notice.   Objective: Vitals:   11/08/20 0414 11/08/20 0833 11/08/20 1221 11/08/20 1620  BP: 124/74 119/67 119/71 108/79  Pulse:  100 86 84  Resp: 19 19 17 19   Temp: 97.6 F (36.4 C) 99 F (37.2 C) 99 F (37.2 C) 98.7 F (37.1 C)  TempSrc: Oral Oral Oral Oral  SpO2: 97% 95% 100% 98%  Weight:      Height:  Intake/Output Summary (Last 24 hours) at 11/08/2020 1732 Last data filed at 11/08/2020 0400 Gross per 24 hour  Intake 420 ml  Output 1300 ml  Net -880 ml    Filed Weights   11/07/20 2217  Weight: 77.3 kg    Examination:  General exam: anxious Respiratory system: CTA Cardiovascular system: S 1, S 2 RRR Gastrointestinal system: BS present, soft, nt Central nervous system: alert Extremities: no edema    Data Reviewed: I have personally reviewed following labs and imaging studies  CBC: Recent Labs  Lab 11/06/20 1327 11/07/20 0522  WBC 9.5 11.3*  NEUTROABS 7.1  --   HGB  13.4 12.8  HCT 40.3 38.9  MCV 85.4 86.6  PLT 328 845    Basic Metabolic Panel: Recent Labs  Lab 11/06/20 1327 11/07/20 0522  NA 137 137  K 4.3 3.6  CL 107 106  CO2 23 25  GLUCOSE 102* 95  BUN 13 9  CREATININE 0.69 0.72  CALCIUM 8.8* 8.4*    GFR: Estimated Creatinine Clearance: 91.2 mL/min (by C-G formula based on SCr of 0.72 mg/dL). Liver Function Tests: Recent Labs  Lab 11/06/20 1327 11/07/20 0522  AST 13* 13*  ALT 14 15  ALKPHOS 69 58  BILITOT 0.6 0.5  PROT 6.7 6.4*  ALBUMIN 3.7 3.5    No results for input(s): LIPASE, AMYLASE in the last 168 hours. No results for input(s): AMMONIA in the last 168 hours. Coagulation Profile: No results for input(s): INR, PROTIME in the last 168 hours. Cardiac Enzymes: No results for input(s): CKTOTAL, CKMB, CKMBINDEX, TROPONINI in the last 168 hours. BNP (last 3 results) No results for input(s): PROBNP in the last 8760 hours. HbA1C: No results for input(s): HGBA1C in the last 72 hours. CBG: No results for input(s): GLUCAP in the last 168 hours. Lipid Profile: No results for input(s): CHOL, HDL, LDLCALC, TRIG, CHOLHDL, LDLDIRECT in the last 72 hours. Thyroid Function Tests: Recent Labs    11/07/20 1430  TSH <0.010*  FREET4 1.30*  T3FREE 3.9   Anemia Panel: No results for input(s): VITAMINB12, FOLATE, FERRITIN, TIBC, IRON, RETICCTPCT in the last 72 hours. Sepsis Labs: No results for input(s): PROCALCITON, LATICACIDVEN in the last 168 hours.  Recent Results (from the past 240 hour(s))  Resp Panel by RT-PCR (Flu A&B, Covid) Nasopharyngeal Swab     Status: None   Collection Time: 11/06/20  5:01 PM   Specimen: Nasopharyngeal Swab; Nasopharyngeal(NP) swabs in vial transport medium  Result Value Ref Range Status   SARS Coronavirus 2 by RT PCR NEGATIVE NEGATIVE Final    Comment: (NOTE) SARS-CoV-2 target nucleic acids are NOT DETECTED.  The SARS-CoV-2 RNA is generally detectable in upper respiratory specimens during the  acute phase of infection. The lowest concentration of SARS-CoV-2 viral copies this assay can detect is 138 copies/mL. A negative result does not preclude SARS-Cov-2 infection and should not be used as the sole basis for treatment or other patient management decisions. A negative result may occur with  improper specimen collection/handling, submission of specimen other than nasopharyngeal swab, presence of viral mutation(s) within the areas targeted by this assay, and inadequate number of viral copies(<138 copies/mL). A negative result must be combined with clinical observations, patient history, and epidemiological information. The expected result is Negative.  Fact Sheet for Patients:  EntrepreneurPulse.com.au  Fact Sheet for Healthcare Providers:  IncredibleEmployment.be  This test is no t yet approved or cleared by the [place] FDA and  has been authorized for detection and/or  diagnosis of SARS-CoV-2 by FDA under an Emergency Use Authorization (EUA). This EUA will remain  in effect (meaning this test can be used) for the duration of the COVID-19 declaration under Section 564(b)(1) of the Act, 21 U.S.C.section 360bbb-3(b)(1), unless the authorization is terminated  or revoked sooner.       Influenza A by PCR NEGATIVE NEGATIVE Final   Influenza B by PCR NEGATIVE NEGATIVE Final    Comment: (NOTE) The Xpert Xpress SARS-CoV-2/FLU/RSV plus assay is intended as an aid in the diagnosis of influenza from Nasopharyngeal swab specimens and should not be used as a sole basis for treatment. Nasal washings and aspirates are unacceptable for Xpert Xpress SARS-CoV-2/FLU/RSV testing.  Fact Sheet for Patients: EntrepreneurPulse.com.au  Fact Sheet for Healthcare Providers: IncredibleEmployment.be  This test is not yet approved or cleared by the [place] FDA and has been authorized for detection and/or  diagnosis of SARS-CoV-2 by FDA under an Emergency Use Authorization (EUA). This EUA will remain in effect (meaning this test can be used) for the duration of the COVID-19 declaration under Section 564(b)(1) of the Act, 21 U.S.C. section 360bbb-3(b)(1), unless the authorization is terminated or revoked.  Performed at [facility], [place] [address] [place], [place] [address]           Radiology Studies: MR BRAIN WO CONTRAST  Result Date: 11/07/2020 CLINICAL DATA:  Bipolar disorder.  Delirium.  Possible seizure. EXAM: MRI HEAD WITHOUT CONTRAST TECHNIQUE: Multiplanar, multiecho pulse sequences of the brain and surrounding structures were obtained without intravenous contrast. COMPARISON:  Head CT yesterday. FINDINGS: Brain: The brain has a normal appearance without evidence of malformation, atrophy, old or acute small or large vessel infarction, mass lesion, hemorrhage, hydrocephalus or extra-axial collection. Mesial temporal lobes appear symmetric and normal. Vascular: Major vessels at the base of the brain show flow. Venous sinuses appear patent. Skull and upper cervical spine: Normal. Sinuses/Orbits: Clear/normal. Other: None significant. IMPRESSION: Normal examination. Electronically Signed   By: Nelson Chimes M.D.   On: 11/07/2020 17:29   EEG adult  Result Date: 11/07/2020 Lora Havens, MD     11/07/2020 10:55 AM Patient Name: Veronica Perez MRN: [id number] Epilepsy Attending: Lora Havens Referring Physician/Provider: Loni Beckwith, PA-C Date: 11/07/2020 Duration: 23.12 mins Patient history: 44 y.o. female with a PMHx of bipolar disorder, depression, panic attacks, thyroid disease and tobacco dependence who presented to the ED via EMS early this afternoon for assessment after she had a seizure-like spell at home. She had reported to her boyfriend that she was going to have a seizure, so she was moved to the bathtub. Her boyfriend then witnessed her begin to have what  appeared to be a syncopal event followed by "shaking" in the bathtub and called EMS. The shaking lasted for 3-5 minutes and occurred at about 1130. Ellene Route reported that upon waking the patient knew who she was and where she was but quickly fell back asleep and had been like that ever since.  EEG to evaluate for seizures. Level of alertness: Awake AEDs during EEG study: Ativan Technical aspects: This EEG study was done with scalp electrodes positioned according to the 10-20 International system of electrode placement. Electrical activity was acquired at a sampling rate of 500Hz  and reviewed with a high frequency filter of 70Hz  and a low frequency filter of 1Hz . EEG data were recorded continuously and digitally stored. Description: EEG showed an excessive amount of 15 to 18 Hz beta activity distributed symmetrically and diffusely. Hyperventilation  and photic stimulation were not performed.   ABNORMALITY - Excessive beta, generalized IMPRESSION: This study is within normal limits. The excessive beta activity seen in the background is most likely due to the effect of benzodiazepine and is a benign EEG pattern. No seizures or epileptiform discharges were seen throughout the recording. Priyanka Barbra Sarks   Overnight EEG with video  Result Date: 11/08/2020 Lora Havens, MD     11/08/2020  9:58 AM Patient Name: AILY TZENG MRN: [id number] Epilepsy Attending: Lora Havens Referring Physician/Provider: Dr Kerney Elbe Duration: 11/07/2020 4235 to 105/2022 1108  Patient history: 45 y.o. female with a PMHx of bipolar disorder, depression, panic attacks, thyroid disease and tobacco dependence who presented to the ED via EMS early this afternoon for assessment after she had a seizure-like spell at home. She had reported to her boyfriend that she was going to have a seizure, so she was moved to the bathtub. Her boyfriend then witnessed her begin to have what appeared to be a syncopal event followed by "shaking" in the  bathtub and called EMS. The shaking lasted for 3-5 minutes and occurred at about 1130. Ellene Route reported that upon waking the patient knew who she was and where she was but quickly fell back asleep and had been like that ever since.  EEG to evaluate for seizures.  Level of alertness: Awake, asleep  AEDs during EEG study: Ativan  Technical aspects: This EEG study was done with scalp electrodes positioned according to the 10-20 International system of electrode placement. Electrical activity was acquired at a sampling rate of 500Hz  and reviewed with a high frequency filter of 70Hz  and a low frequency filter of 1Hz . EEG data were recorded continuously and digitally stored.  Description: EEG showed an excessive amount of 15 to 18 Hz beta activity distributed symmetrically and diffusely. Sleep was characterized by sleep spindles (12-14hz ), maximal frontocentral region. Hyperventilation and photic stimulation were not performed.   Patient pulled electrodes after 1108 and therefore study was not interpretable.  ABNORMALITY - Excessive beta, generalized  IMPRESSION: This study is within normal limits. The excessive beta activity seen in the background is most likely due to the effect of benzodiazepine and is a benign EEG pattern. No seizures or epileptiform discharges were seen throughout the recording.  Priyanka Barbra Sarks        Scheduled Meds:  ARIPiprazole  5 mg Oral Daily   docusate sodium  100 mg Oral BID   enoxaparin (LOVENOX) injection  30 mg Subcutaneous Q24H   haloperidol lactate  0.5 mg Intravenous Once   sulfamethoxazole-trimethoprim  1 tablet Oral Q12H   Continuous Infusions:  sodium chloride Stopped (11/07/20 1128)     LOS: 1 day    Time spent: 35 minutes.     Elmarie Shiley, MD [facility]   If 7PM-7AM, please contact night-coverage [email or web address]  11/08/2020, 5:32 PM

## 2020-11-08 NOTE — Progress Notes (Signed)
Notified by RN that pt complained of vaginal itching and discomfort earlier and wanted a UA and UCG ordered.  UCG negative.  UA hazy with leukocytes but no nitrites. WBC present.  Reports burning with urination. Urine culture ordered.  Will start bactrim DS to cover UTI

## 2020-11-09 ENCOUNTER — Inpatient Hospital Stay (HOSPITAL_COMMUNITY): Payer: 59

## 2020-11-09 DIAGNOSIS — F319 Bipolar disorder, unspecified: Secondary | ICD-10-CM | POA: Diagnosis not present

## 2020-11-09 DIAGNOSIS — E0590 Thyrotoxicosis, unspecified without thyrotoxic crisis or storm: Secondary | ICD-10-CM | POA: Diagnosis not present

## 2020-11-09 DIAGNOSIS — F4310 Post-traumatic stress disorder, unspecified: Secondary | ICD-10-CM | POA: Diagnosis not present

## 2020-11-09 LAB — URINALYSIS, ROUTINE W REFLEX MICROSCOPIC
Bilirubin Urine: NEGATIVE
Glucose, UA: NEGATIVE mg/dL
Hgb urine dipstick: NEGATIVE
Ketones, ur: NEGATIVE mg/dL
Leukocytes,Ua: NEGATIVE
Nitrite: NEGATIVE
Protein, ur: NEGATIVE mg/dL
Specific Gravity, Urine: 1.012 (ref 1.005–1.030)
pH: 5 (ref 5.0–8.0)

## 2020-11-09 LAB — URINE CULTURE: Culture: >=100000 — AB

## 2020-11-09 MED ORDER — CEPHALEXIN 500 MG PO CAPS
500.0000 mg | ORAL_CAPSULE | Freq: Three times a day (TID) | ORAL | Status: DC
  Administered 2020-11-09 – 2020-11-11 (×6): 500 mg via ORAL
  Filled 2020-11-09 (×8): qty 1

## 2020-11-09 MED ORDER — LORAZEPAM 1 MG PO TABS
2.0000 mg | ORAL_TABLET | Freq: Once | ORAL | Status: AC
  Administered 2020-11-09: 2 mg via ORAL
  Filled 2020-11-09: qty 2

## 2020-11-09 NOTE — Consult Note (Signed)
[facility] New Psychiatric Evaluation   Service Date: November 09, 2020 LOS:  LOS: 2 days    Assessment  Veronica Perez is a 45 y.o. female admitted medically for 11/06/2020  1:00 PM for seizurelike activity. She carries the psychiatric diagnoses of bipolar disorder and has a past medical history of  uncontrolled hyperthyroidism. Psychiatry was consulted for agitation, capacity to leave AMA, and need for inpt psych hospitalization by [name], MD.    Her current presentation of vague paranoia, poor sleep and worsening anxiety is most consistent with known diagnosis of bipolar disease complicated by uncontrolled hyperthyroidism although first hospitalization seems to predate diagnosis of hyperthyroidism. Current outpatient psychotropic medications include latuda and historically she has had a fair response to these medications; she is not compliant with any psychotropic medication. On initial examination, patient answered my questions appropriately and denied any thoughts of self-harm or intent to harm others; her fiancee and mother . She does not meet criteria for involuntary psychiatric hospitalization in the state of [place]. Although she has some vague paranoia and some psychotic thought processes and likely does not have capacity to leave AMA, fiancee (most likely surrogate decision maker) is also against keeping pt in hospital against her will. At the time I saw the pt, neurology stated that they had no further inpatient neurological workup. I have reviewed past several psychiatric notes; while patient is not at her baseline she does not seem as acutely ill psychiatrically as in prior presentations.  I do think she is at high risk of decompensating to the point that she requires involuntary treatment however (as above) she does not meet criteria for IVC In  as she is not an immediate threat to herself or others.   10/6: asked to see patient again as pt interested in  talking about medications. Discussed abilify with option to bridge to LAI, used its use in ADHD/depression to help get pt and fiancee buy in. Pt open to taking aripiprazole; fiancee hopeful this will bridge to LAI.   10/7: asked to see pt in early afternoon - mute and almost catatonic appearing (no rigidity, no vs abnormality, has not stopped eating - more parakinetic). Seems overall more like stress reaction after being in hospital w/ hx medical trauma - trying OTO lorazepam to assess for response. She did not have a response with this med; will be reassessed by team tomorrow. D/w fiancee it can take longer to have an effect orally, discuss with team tomorrow if she does start to respond after I leave. Akathisia also on differential although difficult to assess as pt nonverbal.   Diagnoses:  Active Hospital problems: Principal Problem:   Seizure disorder [facility]) Active Problems:   PANIC ATTACKS   TOBACCO DEPENDENCE   Gastroesophageal reflux disease   Bipolar I disorder (HCC)   PTSD (post-traumatic stress disorder)   Hyperthyroidism    Problems edited/added by me: No problems updated.  Plan  ## Safety and Observation Level:  - Based on my clinical evaluation, I estimate the patient to be at low risk of self harm in the current setting - At this time, we recommend a routine level of observation. This decision is based on my review of the chart including patient's history and current presentation, interview of the patient, mental status examination, and consideration of suicide risk including evaluating suicidal ideation, plan, intent, suicidal or self-harm behaviors, risk factors, and protective factors. This judgment is based on our ability to directly address suicide risk,  implement suicide prevention strategies and develop a safety plan while the patient is in the clinical setting. Please contact our team if there is a concern that risk level has changed.  ## Medications:  -- stop  aripiprazole 5 mg qAM  - unclear what is going on with pt at this time; low chance of contributing to catatonia. Would be open to restarting medication in future.   - would generally recommend pt be offered medication with option to transition to LAI such as aripiprazole (would be able to get buy in from pt and fiancee due to being part of ADHD algorithm)  ## Medical Decision Making Capacity:  Not formally assessed today  ## Further Work-up:  -- outpt f/u with psychiatry (please put in information for walk-in hours at [facility]) -- needs outpt f/u with endocrinology (non compliant with methimazole, uncontrolled hyperthyroidism contributing to symptoms)  ## Disposition:  -- likely home with fiancee   ##Legal Status -- voluntary   Thank you for this consult request. Recommendations have been communicated to the primary team.  We will continue to follow at this time.   [name]    NEW history  Relevant Aspects of Hospital Course:  Admitted on 11/06/2020 for seizurelike activity. Has been minimally compliant (ripped off EEG leads due to concerns over glue poisoning her).   After seeing yesterday, TSH came back low with elevated T3/T4.   Patient Report:  Patient seen in early afternoon at request of hospital team, had gotten 2 doses of abilify at this point. She is nearly completely mute, making poor eye contact, holding herself and rocking back and forth - has been going on since this AM. Unable to engage in conversation despite many efforts. Much the same this afternoon. After OTO order of oral lorazepam, pt had no discernable response.   No SI/HI/AH/VH.   ROS:  Unable to perform, pt mute  No chest pain,   Collateral information:  [name] concerned about current ability to care for self.  Psychiatric History:  Information collected from pt, medical record Psych hospitalizations in 2014, 2021 and 2022 - mostly for mania On numerous antipsychotics, mood stabilizers,  etc Generally not compliant with meds.   Family psych history: per chart review, bipolar disorder in mother.   Medical History: Past Medical History:  Diagnosis Date   Bipolar disorder ([place])    Depression    Former smoker, stopped smoking many years ago    HSV (herpes simplex virus) infection    Hypothyroid    Low TSH level 05/30/2016   Panic attacks    Thyroid disease     Surgical History: Past Surgical History:  Procedure Laterality Date   ADENOIDECTOMY     OVARIAN CYST SURGERY     TONSILLECTOMY      Medications:   Current Facility-Administered Medications:    0.9 %  sodium chloride infusion, , Intravenous, Continuous, Shawna Clamp, MD, Stopped at 11/07/20 1128   acetaminophen (TYLENOL) tablet 650 mg, 650 mg, Oral, Q6H PRN, 650 mg at 11/09/20 0424 **OR** acetaminophen (TYLENOL) suppository 650 mg, 650 mg, Rectal, Q6H PRN, Shawna Clamp, MD   ARIPiprazole (ABILIFY) tablet 5 mg, 5 mg, Oral, Daily, Merwin Breden A, 5 mg at 11/09/20 0821   cephALEXin (KEFLEX) capsule 500 mg, 500 mg, Oral, Q8H, Regalado, Belkys A, MD, 500 mg at 11/09/20 1622   docusate sodium (COLACE) capsule 100 mg, 100 mg, Oral, BID, Shawna Clamp, MD, 100 mg at 11/09/20 0820   enoxaparin (LOVENOX) injection 30 mg,  30 mg, Subcutaneous, Q24H, Shawna Clamp, MD, 30 mg at 11/08/20 0856   haloperidol lactate (HALDOL) injection 0.5 mg, 0.5 mg, Intravenous, Once, Regalado, Belkys A, MD   hydrALAZINE (APRESOLINE) injection 10 mg, 10 mg, Intravenous, Q8H PRN, Shawna Clamp, MD   LORazepam (ATIVAN) tablet 1 mg, 1 mg, Oral, Q6H PRN, Regalado, Belkys A, MD, 1 mg at 11/08/20 0019   ondansetron (ZOFRAN) tablet 4 mg, 4 mg, Oral, Q6H PRN **OR** ondansetron (ZOFRAN) injection 4 mg, 4 mg, Intravenous, Q6H PRN, Shawna Clamp, MD  Allergies: Allergies  Allergen Reactions   Penicillins Hives, Itching and Nausea And Vomiting    Has patient had a PCN reaction causing immediate rash, facial/tongue/throat swelling,  SOB or lightheadedness with hypotension: No Has patient had a PCN reaction causing severe rash involving mucus membranes or skin necrosis: No Has patient had a PCN reaction that required hospitalization: No Has patient had a PCN reaction occurring within the last 10 years: No If all of the above answers are "NO", then may proceed with Cephalosporin use.   Iodine    Lactose Intolerance (Gi)    Penicillins Hives and Nausea And Vomiting    Social History:  Lives with fiancee Has 61ish y/o daughter  Tobacco use: denies - quit a couple of months ago. Not using nicotine replacement outside of the hospital Alcohol use: denies Drug use: denies  Family History:  The patient's family history includes Bipolar disorder in her mother; Cancer in her maternal grandmother; Diabetes in her father; Heart disease in her father.    Objective  Vital signs:  Temp:  [98.4 F (36.9 C)-99.1 F (37.3 C)] 98.5 F (36.9 C) (10/07 0740) Pulse Rate:  [83-100] 95 (10/07 0821) Resp:  [15-20] 17 (10/07 0821) BP: (102-130)/(65-78) 124/78 (10/07 0740) SpO2:  [96 %-99 %] 98 % (10/07 3785)  Physical Exam: Gen: Alert, oriented, in no acute distress. Holding self and shaking back and forth.  Neuro: CNII-XII grossly intact. Alternatly Pacing and rocking back and forth throughout interview Pulm: no increased work of breathing Skin: no rash noted  Mental Status Exam: (reflecting first exam) Appearance: Awake, pacing  Attitude:  Fearful, shaking back and forth, not cooperative  Behavior/Psychomotor: Mild tremor noted  Speech/Language:  Soft, normal amount  Mood: Did not state  Affect: scared  Thought process: Could not assess  Thought content:   Could not assess  Perceptual disturbances:  Could not assess  Attention: Could not assess.   Concentration: Could not assess  Orientation: Could not assess  Memory: Could not assess  Fund of knowledge:  Could not assess  Insight:   poor  Judgment:  poor   Impulse Control: poor     Data Reviewed: Chart

## 2020-11-09 NOTE — Progress Notes (Signed)
PROGRESS NOTE    Veronica Perez  ATF:[id number] DOB: 29-Jun-1975 DOA: 11/06/2020 PCP: Pcp, No   Brief Narrative: 45 year old with past medical history significant for bipolar disorder, depression, panic attacks, PTSD, tobacco abuse presented to the ED with complaints of reported seizure.  Per fianc patient had generalized body shaking which lasted longer.  He found her in the bathroom and in the bathtub with generalized body shaking that lasted 3 to 5 minutes.,  He reported a urine incontinence episode.  After the episode she was awake and aware where she was.  She then went back to sleep.   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 Patient remove wirer.  MRI negative Patient lack capacity, family will need to help patient for medical decision. Per psych Patient does not need to be IVC for psychiatric reasons.    Hyperthyroidism:  History of Low TSH: Follow up in the past by Dr Renato Shin for hyperthyroidism.  Diagnosed 2018 [place] 2018 showed multinodular goiter.  Currently patient does not have sign of thyroid storm, no hypothermia, tachycardia. She does have anxiety/  -Discussed with Endocrinologist Dr Buddy Duty with free T 3 at 1.3 patient thats probably some underline psych problems, uncontrol thyroid could exacerbate psych problems. He could see patient office 10/11 at 10; am.  Patient does not want to start methimazole. She is willing to follow up with Dr Buddy Duty out patient.  HR 80--100. Mostly 80. Monitor to consider BB.  Follow endocrinologist out patient.   Bipolar disorder/PTSD/anxiety She has not neen taking any medication.  We will change lorazepam to as needed. Started on Abilify She appears  more anxious today and upset.   UTI:  Started on Bactrim.  Urine growing strep, change antibiotics  to Keflex.   Abdominal pain. She is holding her abdomen. Does not elaborate in history.  I order KUB, patient decline.   EKG reading with a flutter 321 AV block: EKG reviewed by cardiology likely artifact.    Estimated body mass index is 29.25 kg/m as calculated from the following:   Height as of this encounter: 5\' 4"  (1.626 m).   Weight as of this encounter: 77.3 kg.   DVT prophylaxis: Lovenox Code Status: full code Family Communication: fiancee  Disposition Plan:  Status is: Inpatient  Remains inpatient appropriate because:Inpatient level of care appropriate due to severity of illness  Dispo: The patient is from: Home              Anticipated d/c is to: Home              Patient currently is not medically stable to d/c.   Difficult to place patient No        Consultants:  Neurology Psych   Procedures:  EEG  Antimicrobials:    Subjective: She is more anxious today, holding her lower abdomen. Complaints of pain.    Objective: Vitals:   11/08/20 2332 11/09/20 0412 11/09/20 0740 11/09/20 0821  BP: 105/74 102/65 124/78   Pulse: 89 100 83 95  Resp: 15 17 16 17   Temp: 98.6 F (37 C) 98.4 F (36.9 C) 98.5 F (36.9 C)   TempSrc: Oral Oral Axillary   SpO2: 97% 97% 96% 98%  Weight:  Height:       No intake or output data in the 24 hours ending 11/09/20 1522  Filed Weights   11/07/20 2217  Weight: 77.3 kg    Examination:  General exam: Anxious Respiratory system: CTA Cardiovascular system: S 1, S 2 RRR Gastrointestinal system: BS present, soft, nt Central nervous system: Alert Extremities: No edema    Data Reviewed: I have personally reviewed following labs and imaging studies  CBC: Recent Labs  Lab 11/06/20 1327 11/07/20 0522  WBC 9.5 11.3*  NEUTROABS 7.1  --   HGB 13.4 12.8  HCT 40.3 38.9  MCV 85.4 86.6  PLT 328 291     Basic Metabolic Panel: Recent Labs  Lab 11/06/20 1327 11/07/20 0522  NA 137 137  K 4.3 3.6  CL 107 106  CO2 23 25  GLUCOSE 102* 95  BUN 13 9  CREATININE 0.69 0.72  CALCIUM 8.8* 8.4*    GFR: Estimated Creatinine Clearance: 91.2 mL/min (by C-G formula based on SCr of 0.72 mg/dL). Liver Function Tests: Recent Labs  Lab 11/06/20 1327 11/07/20 0522  AST 13* 13*  ALT 14 15  ALKPHOS 69 58  BILITOT 0.6 0.5  PROT 6.7 6.4*  ALBUMIN 3.7 3.5    No results for input(s): LIPASE, AMYLASE in the last 168 hours. No results for input(s): AMMONIA in the last 168 hours. Coagulation Profile: No results for input(s): INR, PROTIME in the last 168 hours. Cardiac Enzymes: No results for input(s): CKTOTAL, CKMB, CKMBINDEX, TROPONINI in the last 168 hours. BNP (last 3 results) No results for input(s): PROBNP in the last 8760 hours. HbA1C: No results for input(s): HGBA1C in the last 72 hours. CBG: No results for input(s): GLUCAP in the last 168 hours. Lipid Profile: No results for input(s): CHOL, HDL, LDLCALC, TRIG, CHOLHDL, LDLDIRECT in the last 72 hours. Thyroid Function Tests: Recent Labs    11/07/20 1430  TSH <0.010*  FREET4 1.30*  T3FREE 3.9    Anemia Panel: No results for input(s): VITAMINB12, FOLATE, FERRITIN, TIBC, IRON, RETICCTPCT in the last 72 hours. Sepsis Labs: No results for input(s): PROCALCITON, LATICACIDVEN in the last 168 hours.  Recent Results (from the past 240 hour(s))  Resp Panel by RT-PCR (Flu A&B, Covid) Nasopharyngeal Swab     Status: None   Collection Time: 11/06/20  5:01 PM   Specimen: Nasopharyngeal Swab; Nasopharyngeal(NP) swabs in vial transport medium  Result Value Ref Range Status   SARS Coronavirus 2 by RT PCR NEGATIVE NEGATIVE Final    Comment: (NOTE) SARS-CoV-2 target nucleic acids are NOT DETECTED.  The SARS-CoV-2 RNA is generally detectable in upper respiratory specimens during the acute phase of infection. The lowest concentration  of SARS-CoV-2 viral copies this assay can detect is 138 copies/mL. A negative result does not preclude SARS-Cov-2 infection and should not be used as the sole basis for treatment or other patient management decisions. A negative result may occur with  improper specimen collection/handling, submission of specimen other than nasopharyngeal swab, presence of viral mutation(s) within the areas targeted by this assay, and inadequate number of viral copies(<138 copies/mL). A negative result must be combined with clinical observations, patient history, and epidemiological information. The expected result is Negative.  Fact Sheet for Patients:  EntrepreneurPulse.com.au  Fact Sheet for Healthcare Providers:  IncredibleEmployment.be  This test is no t yet approved or cleared by the [place] FDA and  has been authorized for detection and/or diagnosis of SARS-CoV-2 by FDA under an Emergency Use Authorization (EUA). This  EUA will remain  in effect (meaning this test can be used) for the duration of the COVID-19 declaration under Section 564(b)(1) of the Act, 21 U.S.C.section 360bbb-3(b)(1), unless the authorization is terminated  or revoked sooner.       Influenza A by PCR NEGATIVE NEGATIVE Final   Influenza B by PCR NEGATIVE NEGATIVE Final    Comment: (NOTE) The Xpert Xpress SARS-CoV-2/FLU/RSV plus assay is intended as an aid in the diagnosis of influenza from Nasopharyngeal swab specimens and should not be used as a sole basis for treatment. Nasal washings and aspirates are unacceptable for Xpert Xpress SARS-CoV-2/FLU/RSV testing.  Fact Sheet for Patients: EntrepreneurPulse.com.au  Fact Sheet for Healthcare Providers: IncredibleEmployment.be  This test is not yet approved or cleared by the [place] FDA and has been authorized for detection and/or diagnosis of SARS-CoV-2 by FDA under an Emergency Use  Authorization (EUA). This EUA will remain in effect (meaning this test can be used) for the duration of the COVID-19 declaration under Section 564(b)(1) of the Act, 21 U.S.C. section 360bbb-3(b)(1), unless the authorization is terminated or revoked.  Performed at [facility], [place] [address] [place], [place] [address]   Urine Culture     Status: Abnormal   Collection Time: 11/08/20 12:40 AM   Specimen: In/Out Cath Urine  Result Value Ref Range Status   Specimen Description IN/OUT CATH URINE  Final   Special Requests NONE  Final   Culture (A)  Final    >=100,000 COLONIES/mL STREPTOCOCCUS AGALACTIAE TESTING AGAINST S. AGALACTIAE NOT ROUTINELY PERFORMED DUE TO PREDICTABILITY OF AMP/PEN/VAN SUSCEPTIBILITY. Performed at [facility], [place] [address] [place], [place] [address]    Report Status 11/09/2020 FINAL  Final          Radiology Studies: MR BRAIN WO CONTRAST  Result Date: 11/07/2020 CLINICAL DATA:  Bipolar disorder.  Delirium.  Possible seizure. EXAM: MRI HEAD WITHOUT CONTRAST TECHNIQUE: Multiplanar, multiecho pulse sequences of the brain and surrounding structures were obtained without intravenous contrast. COMPARISON:  Head CT yesterday. FINDINGS: Brain: The brain has a normal appearance without evidence of malformation, atrophy, old or acute small or large vessel infarction, mass lesion, hemorrhage, hydrocephalus or extra-axial collection. Mesial temporal lobes appear symmetric and normal. Vascular: Major vessels at the base of the brain show flow. Venous sinuses appear patent. Skull and upper cervical spine: Normal. Sinuses/Orbits: Clear/normal. Other: None significant. IMPRESSION: Normal examination. Electronically Signed   By: Nelson Chimes M.D.   On: 11/07/2020 17:29   Overnight EEG with video  Result Date: 11/08/2020 Lora Havens, MD     11/08/2020  9:58 AM Patient Name: Veronica Perez MRN: [id number] Epilepsy Attending: Lora Havens Referring  Physician/Provider: Dr Kerney Elbe Duration: 11/07/2020 3532 to 105/2022 1108  Patient history: 45 y.o. female with a PMHx of bipolar disorder, depression, panic attacks, thyroid disease and tobacco dependence who presented to the ED via EMS early this afternoon for assessment after she had a seizure-like spell at home. She had reported to her boyfriend that she was going to have a seizure, so she was moved to the bathtub. Her boyfriend then witnessed her begin to have what appeared to be a syncopal event followed by "shaking" in the bathtub and called EMS. The shaking lasted for 3-5 minutes and occurred at about 1130. Ellene Route reported that upon waking the patient knew who she was and where she was but quickly fell back asleep and had been like that ever since.  EEG to  evaluate for seizures.  Level of alertness: Awake, asleep  AEDs during EEG study: Ativan  Technical aspects: This EEG study was done with scalp electrodes positioned according to the 10-20 International system of electrode placement. Electrical activity was acquired at a sampling rate of 500Hz  and reviewed with a high frequency filter of 70Hz  and a low frequency filter of 1Hz . EEG data were recorded continuously and digitally stored.  Description: EEG showed an excessive amount of 15 to 18 Hz beta activity distributed symmetrically and diffusely. Sleep was characterized by sleep spindles (12-14hz ), maximal frontocentral region. Hyperventilation and photic stimulation were not performed.   Patient pulled electrodes after 1108 and therefore study was not interpretable.  ABNORMALITY - Excessive beta, generalized  IMPRESSION: This study is within normal limits. The excessive beta activity seen in the background is most likely due to the effect of benzodiazepine and is a benign EEG pattern. No seizures or epileptiform discharges were seen throughout the recording.  Priyanka Barbra Sarks        Scheduled Meds:  ARIPiprazole  5 mg Oral Daily   cephALEXin   500 mg Oral Q8H   docusate sodium  100 mg Oral BID   enoxaparin (LOVENOX) injection  30 mg Subcutaneous Q24H   haloperidol lactate  0.5 mg Intravenous Once   Continuous Infusions:  sodium chloride Stopped (11/07/20 1128)     LOS: 2 days    Time spent: 35 minutes.     Elmarie Shiley, MD [facility]   If 7PM-7AM, please contact night-coverage [email or web address]  11/09/2020, 3:22 PM

## 2020-11-09 NOTE — Progress Notes (Signed)
MD in to see patient. Gave new order for urinalysis to monitor UTI and course of antibiotic. Educated patient to urinate in specimen hat. Placed specimen hat in bedside commode and in toilet. Nodded head in understanding.     1300- Patient has yet to offer specimen.    1430- Patient has yet to offer specimen.

## 2020-11-10 LAB — BASIC METABOLIC PANEL
Anion gap: 7 (ref 5–15)
BUN: 10 mg/dL (ref 6–20)
CO2: 22 mmol/L (ref 22–32)
Calcium: 9 mg/dL (ref 8.9–10.3)
Chloride: 109 mmol/L (ref 98–111)
Creatinine, Ser: 0.61 mg/dL (ref 0.44–1.00)
GFR, Estimated: >60 mL/min (ref >60)
Glucose, Bld: 90 mg/dL (ref 70–99)
Potassium: 3.9 mmol/L (ref 3.5–5.1)
Sodium: 138 mmol/L (ref 135–145)

## 2020-11-10 LAB — GLUCOSE, CAPILLARY: Glucose-Capillary: 81 mg/dL (ref 70–99)

## 2020-11-10 MED ORDER — QUETIAPINE FUMARATE 25 MG PO TABS: 25.0000 mg | ORAL_TABLET | Freq: Two times a day (BID) | ORAL | Status: DC

## 2020-11-10 MED ORDER — QUETIAPINE FUMARATE 25 MG PO TABS: 25.0000 mg | ORAL_TABLET | Freq: Two times a day (BID) | ORAL | 0 refills | Status: DC

## 2020-11-10 MED ORDER — CEPHALEXIN 500 MG PO CAPS: 500.0000 mg | ORAL_CAPSULE | Freq: Three times a day (TID) | ORAL | 0 refills | Status: DC
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
## 2020-11-10 NOTE — Progress Notes (Signed)
Attempting to review discharge instructions with patient. Patient is nonverbal, barely opening her eyes, pouting and rocking back and forth. Fiance at bedside offering support but patient is cognitively different than earlier assessed.

## 2020-11-10 NOTE — Discharge Summary (Signed)
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 11/06/20  5:01 PM   Specimen: Nasopharyngeal Swab; Nasopharyngeal(NP) swabs in vial transport medium  Result Value Ref Range Status   SARS Coronavirus 2 by RT PCR NEGATIVE NEGATIVE Final    Comment: (NOTE) SARS-CoV-2 target nucleic acids are NOT DETECTED.  The SARS-CoV-2 RNA is generally detectable in upper respiratory specimens during the acute phase of infection. The lowest concentration of SARS-CoV-2 viral copies this assay can detect is 138 copies/mL. A negative result does not preclude SARS-Cov-2 infection and should not be used as the sole basis for treatment or other patient management decisions. A negative result may occur with  improper specimen collection/handling, submission of specimen other than nasopharyngeal swab, presence of viral mutation(s) within the areas targeted by this assay, and inadequate number of viral copies(<138 copies/mL). A negative result must be combined with clinical observations, patient history, and epidemiological information. The expected result is Negative.  Fact Sheet for Patients:   EntrepreneurPulse.com.au  Fact Sheet for Healthcare Providers:  IncredibleEmployment.be  This test is no t yet approved or cleared by the [place] FDA and  has been authorized for detection and/or diagnosis of SARS-CoV-2 by FDA under an Emergency Use Authorization (EUA). This EUA will remain  in effect (meaning this test can be used) for the duration of the COVID-19 declaration under Section 564(b)(1) of the Act, 21 U.S.C.section 360bbb-3(b)(1), unless the authorization is terminated  or revoked sooner.       Influenza A by PCR NEGATIVE NEGATIVE Final   Influenza B by PCR NEGATIVE NEGATIVE Final    Comment: (NOTE) The Xpert Xpress SARS-CoV-2/FLU/RSV plus assay is intended as an aid in the diagnosis of influenza from Nasopharyngeal swab specimens and should not be used as a sole basis for treatment. Nasal washings and aspirates are unacceptable for Xpert Xpress SARS-CoV-2/FLU/RSV testing.  Fact Sheet for Patients: EntrepreneurPulse.com.au  Fact Sheet for Healthcare Providers: IncredibleEmployment.be  This test is not yet approved or cleared by the [place] FDA and has been authorized for detection and/or diagnosis of SARS-CoV-2 by FDA under an Emergency Use Authorization (EUA). This EUA will remain in effect (meaning this test can be used) for the duration of the COVID-19 declaration under Section 564(b)(1) of the Act, 21 U.S.C. section 360bbb-3(b)(1), unless the authorization is terminated or revoked.  Performed at [facility], [place] [address], [place], [place] [address]   Urine Culture     Status: Abnormal  Collection Time: 11/08/20 12:40 AM   Specimen: In/Out Cath Urine  Result Value Ref Range Status   Specimen Description IN/OUT CATH URINE  Final   Special Requests NONE  Final   Culture (A)  Final    >=100,000 COLONIES/mL STREPTOCOCCUS AGALACTIAE TESTING AGAINST S. AGALACTIAE  NOT ROUTINELY PERFORMED DUE TO PREDICTABILITY OF AMP/PEN/VAN SUSCEPTIBILITY. Performed at [facility], [place] [address], [place], [place] [address]    Report Status 11/09/2020 FINAL  Final     Labs: BNP (last 3 results) No results for input(s): BNP in the last 8760 hours. Basic Metabolic Panel: Recent Labs  Lab 11/06/20 1327 11/07/20 0522  NA 137 137  K 4.3 3.6  CL 107 106  CO2 23 25  GLUCOSE 102* 95  BUN 13 9  CREATININE 0.69 0.72  CALCIUM 8.8* 8.4*   Liver Function Tests: Recent Labs  Lab 11/06/20 1327 11/07/20 0522  AST 13* 13*  ALT 14 15  ALKPHOS 69 58  BILITOT 0.6 0.5  PROT 6.7 6.4*  ALBUMIN 3.7 3.5   No results for input(s): LIPASE, AMYLASE in the last 168 hours. No results for input(s): AMMONIA in the last 168 hours. CBC: Recent Labs  Lab 11/06/20 1327 11/07/20 0522  WBC 9.5 11.3*  NEUTROABS 7.1  --   HGB 13.4 12.8  HCT 40.3 38.9  MCV 85.4 86.6  PLT 328 291   Cardiac Enzymes: No results for input(s): CKTOTAL, CKMB, CKMBINDEX, TROPONINI in the last 168 hours. BNP: Invalid input(s): POCBNP CBG: No results for input(s): GLUCAP in the last 168 hours. D-Dimer No results for input(s): DDIMER in the last 72 hours. Hgb A1c No results for input(s): HGBA1C in the last 72 hours. Lipid Profile No results for input(s): CHOL, HDL, LDLCALC, TRIG, CHOLHDL, LDLDIRECT in the last 72 hours. Thyroid function studies No results for input(s): TSH, T4TOTAL, T3FREE, THYROIDAB in the last 72 hours.  Invalid input(s): FREET3 Anemia work up No results for input(s): VITAMINB12, FOLATE, FERRITIN, TIBC, IRON, RETICCTPCT in the last 72 hours. Urinalysis    Component Value Date/Time   COLORURINE STRAW (A) 11/09/2020 2050   APPEARANCEUR CLEAR 11/09/2020 2050   LABSPEC 1.012 11/09/2020 2050   PHURINE 5.0 11/09/2020 2050   GLUCOSEU NEGATIVE 11/09/2020 2050   [place] NEGATIVE 11/09/2020 2050   HGBUR trace-intact 03/01/2010 1016   [place] NEGATIVE 11/09/2020 2050    [place] NEGATIVE 11/09/2020 2050   PROTEINUR NEGATIVE 11/09/2020 2050   UROBILINOGEN 0.2 03/11/2018 1722   NITRITE NEGATIVE 11/09/2020 2050   LEUKOCYTESUR NEGATIVE 11/09/2020 2050   Sepsis Labs Invalid input(s): PROCALCITONIN,  WBC,  LACTICIDVEN Microbiology Recent Results (from the past 240 hour(s))  Resp Panel by RT-PCR (Flu A&B, Covid) Nasopharyngeal Swab     Status: None   Collection Time: 11/06/20  5:01 PM   Specimen: Nasopharyngeal Swab; Nasopharyngeal(NP) swabs in vial transport medium  Result Value Ref Range Status   SARS Coronavirus 2 by RT PCR NEGATIVE NEGATIVE Final    Comment: (NOTE) SARS-CoV-2 target nucleic acids are NOT DETECTED.  The SARS-CoV-2 RNA is generally detectable in upper respiratory specimens during the acute phase of infection. The lowest concentration of SARS-CoV-2 viral copies this assay can detect is 138 copies/mL. A negative result does not preclude SARS-Cov-2 infection and should not be used as the sole basis for treatment or other patient management decisions. A negative result may occur with  improper specimen collection/handling, submission of specimen other than nasopharyngeal swab, presence of viral mutation(s) within the areas targeted by  this assay, and inadequate number of viral copies(<138 copies/mL). A negative result must be combined with clinical observations, patient history, and epidemiological information. The expected result is Negative.  Fact Sheet for Patients:  EntrepreneurPulse.com.au  Fact Sheet for Healthcare Providers:  IncredibleEmployment.be  This test is no t yet approved or cleared by the [place] FDA and  has been authorized for detection and/or diagnosis of SARS-CoV-2 by FDA under an Emergency Use Authorization (EUA). This EUA will remain  in effect (meaning this test can be used) for the duration of the COVID-19 declaration under Section 564(b)(1) of the Act,  21 U.S.C.section 360bbb-3(b)(1), unless the authorization is terminated  or revoked sooner.       Influenza A by PCR NEGATIVE NEGATIVE Final   Influenza B by PCR NEGATIVE NEGATIVE Final    Comment: (NOTE) The Xpert Xpress SARS-CoV-2/FLU/RSV plus assay is intended as an aid in the diagnosis of influenza from Nasopharyngeal swab specimens and should not be used as a sole basis for treatment. Nasal washings and aspirates are unacceptable for Xpert Xpress SARS-CoV-2/FLU/RSV testing.  Fact Sheet for Patients: EntrepreneurPulse.com.au  Fact Sheet for Healthcare Providers: IncredibleEmployment.be  This test is not yet approved or cleared by the [place] FDA and has been authorized for detection and/or diagnosis of SARS-CoV-2 by FDA under an Emergency Use Authorization (EUA). This EUA will remain in effect (meaning this test can be used) for the duration of the COVID-19 declaration under Section 564(b)(1) of the Act, 21 U.S.C. section 360bbb-3(b)(1), unless the authorization is terminated or revoked.  Performed at [facility], [place] [address], [place], [place] [address]   Urine Culture     Status: Abnormal   Collection Time: 11/08/20 12:40 AM   Specimen: In/Out Cath Urine  Result Value Ref Range Status   Specimen Description IN/OUT CATH URINE  Final   Special Requests NONE  Final   Culture (A)  Final    >=100,000 COLONIES/mL STREPTOCOCCUS AGALACTIAE TESTING AGAINST S. AGALACTIAE NOT ROUTINELY PERFORMED DUE TO PREDICTABILITY OF AMP/PEN/VAN SUSCEPTIBILITY. Performed at [facility], [place] [address], [place], [place] [address]    Report Status 11/09/2020 FINAL  Final     Time coordinating discharge: 40 minutes  SIGNED:   Elmarie Shiley, MD  [facility]

## 2020-11-11 LAB — CBC
HCT: 40.6 % (ref 36.0–46.0)
Hemoglobin: 13.7 g/dL (ref 12.0–15.0)
MCH: 28.2 pg (ref 26.0–34.0)
MCHC: 33.7 g/dL (ref 30.0–36.0)
MCV: 83.5 fL (ref 80.0–100.0)
Platelets: 300 10*3/uL (ref 150–400)
RBC: 4.86 MIL/uL (ref 3.87–5.11)
RDW: 12.7 % (ref 11.5–15.5)
WBC: 8.6 10*3/uL (ref 4.0–10.5)
nRBC: 0 % (ref 0.0–0.2)

## 2020-11-11 LAB — BASIC METABOLIC PANEL
Anion gap: 10 (ref 5–15)
BUN: 11 mg/dL (ref 6–20)
CO2: 19 mmol/L — ABNORMAL LOW (ref 22–32)
Calcium: 9.1 mg/dL (ref 8.9–10.3)
Chloride: 106 mmol/L (ref 98–111)
Creatinine, Ser: 0.59 mg/dL (ref 0.44–1.00)
GFR, Estimated: >60 mL/min (ref >60)
Glucose, Bld: 110 mg/dL — ABNORMAL HIGH (ref 70–99)
Potassium: 3.8 mmol/L (ref 3.5–5.1)
Sodium: 135 mmol/L (ref 135–145)

## 2020-11-11 MED ORDER — OLANZAPINE 5 MG PO TBDP
5.0000 mg | ORAL_TABLET | Freq: Once | ORAL | Status: AC
  Administered 2020-11-11: 5 mg via ORAL
  Filled 2020-11-11: qty 1

## 2020-11-11 MED ORDER — ENOXAPARIN SODIUM 40 MG/0.4ML IJ SOSY
40.0000 mg | PREFILLED_SYRINGE | INTRAMUSCULAR | Status: DC
  Administered 2020-11-12: 40 mg via SUBCUTANEOUS
  Filled 2020-11-11: qty 0.4

## 2020-11-11 MED ORDER — CEPHALEXIN 500 MG PO CAPS
500.0000 mg | ORAL_CAPSULE | Freq: Three times a day (TID) | ORAL | Status: AC
  Administered 2020-11-11 – 2020-11-12 (×3): 500 mg via ORAL
  Filled 2020-11-11 (×3): qty 1

## 2020-11-11 NOTE — Progress Notes (Signed)
PROGRESS NOTE    Veronica Perez  SWF:[id number] DOB: [birth year] DOA: 11/06/2020 PCP: Pcp, No   Brief Narrative: 45 year old with past medical history significant for bipolar disorder, depression, panic attacks, PTSD, tobacco abuse presented to the ED with complaints of reported seizure.  Per fianc patient had generalized body shaking which lasted longer.  He found her in the bathroom and in the bathtub with generalized body shaking that lasted 3 to 5 minutes.,  He reported a urine incontinence episode.  After the episode she was awake and aware where she was.  She then went back to sleep.   Assessment & Plan:   Principal Problem:   Seizure disorder ([place]) Active Problems:   PANIC ATTACKS   TOBACCO DEPENDENCE   Gastroesophageal reflux disease   Bipolar I disorder (HCC)   PTSD (post-traumatic stress disorder)   Hyperthyroidism   1-Bipolar disorder/PTSD/anxiety She has not neen taking any medication.  We will change lorazepam to as needed. Abilify discontinue due to concern for adverse effect.  Patient discharge cancel yesterday, because patient became nonverbal, barely opening eyes. Then overnight was crying, received Zyprexa.   Patient sleepy this am.  Will ask psych to see patient again.   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 Patient remove wirer.  MRI negative Patient lack capacity, family will need to help patient for medical decision. Per psych Patient does not need to be IVC for psychiatric reasons.    Hyperthyroidism:  History of Low TSH: Follow up in the past by Dr Renato Shin for hyperthyroidism.  Diagnosed 2018 [place] 2018 showed multinodular goiter.  Currently patient does not have sign of thyroid storm, no hypothermia, tachycardia. She does have anxiety/  -Discussed with Endocrinologist Dr Buddy Duty with free T 3 at 1.3 patient thats probably some underline psych problems, uncontrol thyroid could exacerbate psych problems. He  could see patient office 10/11 at 10; am.  Patient does not want to start methimazole. She is willing to follow up with Dr Buddy Duty out patient.  HR 80--100. Mostly 80. Monitor to consider BB.  Follow endocrinologist out patient.    UTI:  Started on Bactrim.  Urine growing strep, change antibiotics  to Keflex.   Abdominal pain. She is holding her abdomen. Does not elaborate in history.  I order KUB, patient decline.  Resolved.   EKG reading with a flutter 321 AV block: EKG reviewed by cardiology likely artifact.    Estimated body mass index is 29.25 kg/m as calculated from the following:   Height as of this encounter: 5\' 4"  (1.626 m).   Weight as of this encounter: 77.3 kg.   DVT prophylaxis: Lovenox Code Status: full code Family Communication: fiancee  Disposition Plan:  Status is: Inpatient  Remains inpatient appropriate because:Inpatient level of care appropriate due to severity of illness  Dispo: The patient is from: Home              Anticipated d/c is to: Home              Patient currently is not medically stable to d/c.   Difficult to place patient No        Consultants:  Neurology Psych   Procedures:  EEG  Antimicrobials:    Subjective: She was sleepy, moves extremities passively.   Objective: Vitals:   11/11/20 0008 11/11/20 0400 11/11/20 0810 11/11/20 1228  BP: (!) 156/88 136/80 132/75 102/67  Pulse: 90 72 75 95  Resp: 20 15 14 17   Temp: 97.6  F (36.4 C) 97.6 F (36.4 C)  97.6 F (36.4 C)  TempSrc: Axillary Oral  Axillary  SpO2: 98% 98% 99% 96%  Weight:      Height:        Intake/Output Summary (Last 24 hours) at 11/11/2020 1509 Last data filed at 11/11/2020 1059 Gross per 24 hour  Intake 120 ml  Output --  Net 120 ml    Filed Weights   11/07/20 2217  Weight: 77.3 kg    Examination:  General exam: Sleepy Respiratory system: CTA Cardiovascular system: S 1, S 2 RRR Gastrointestinal system: BS present, soft, nt Central  nervous system: sleepy Extremities: no edema    Data Reviewed: I have personally reviewed following labs and imaging studies  CBC: Recent Labs  Lab 11/06/20 1327 11/07/20 0522 11/11/20 0154  WBC 9.5 11.3* 8.6  NEUTROABS 7.1  --   --   HGB 13.4 12.8 13.7  HCT 40.3 38.9 40.6  MCV 85.4 86.6 83.5  PLT 328 291 299    Basic Metabolic Panel: Recent Labs  Lab 11/06/20 1327 11/07/20 0522 11/10/20 1913 11/11/20 0154  NA 137 137 138 135  K 4.3 3.6 3.9 3.8  CL 107 106 109 106  CO2 23 25 22  19*  GLUCOSE 102* 95 90 110*  BUN 13 9 10 11   CREATININE 0.69 0.72 0.61 0.59  CALCIUM 8.8* 8.4* 9.0 9.1    GFR: Estimated Creatinine Clearance: 91.2 mL/min (by C-G formula based on SCr of 0.59 mg/dL). Liver Function Tests: Recent Labs  Lab 11/06/20 1327 11/07/20 0522  AST 13* 13*  ALT 14 15  ALKPHOS 69 58  BILITOT 0.6 0.5  PROT 6.7 6.4*  ALBUMIN 3.7 3.5    No results for input(s): LIPASE, AMYLASE in the last 168 hours. No results for input(s): AMMONIA in the last 168 hours. Coagulation Profile: No results for input(s): INR, PROTIME in the last 168 hours. Cardiac Enzymes: No results for input(s): CKTOTAL, CKMB, CKMBINDEX, TROPONINI in the last 168 hours. BNP (last 3 results) No results for input(s): PROBNP in the last 8760 hours. HbA1C: No results for input(s): HGBA1C in the last 72 hours. CBG: Recent Labs  Lab 11/10/20 1849  GLUCAP 81   Lipid Profile: No results for input(s): CHOL, HDL, LDLCALC, TRIG, CHOLHDL, LDLDIRECT in the last 72 hours. Thyroid Function Tests: No results for input(s): TSH, T4TOTAL, FREET4, T3FREE, THYROIDAB in the last 72 hours.  Anemia Panel: No results for input(s): VITAMINB12, FOLATE, FERRITIN, TIBC, IRON, RETICCTPCT in the last 72 hours. Sepsis Labs: No results for input(s): PROCALCITON, LATICACIDVEN in the last 168 hours.  Recent Results (from the past 240 hour(s))  Resp Panel by RT-PCR (Flu A&B, Covid) Nasopharyngeal Swab     Status:  None   Collection Time: 11/06/20  5:01 PM   Specimen: Nasopharyngeal Swab; Nasopharyngeal(NP) swabs in vial transport medium  Result Value Ref Range Status   SARS Coronavirus 2 by RT PCR NEGATIVE NEGATIVE Final    Comment: (NOTE) SARS-CoV-2 target nucleic acids are NOT DETECTED.  The SARS-CoV-2 RNA is generally detectable in upper respiratory specimens during the acute phase of infection. The lowest concentration of SARS-CoV-2 viral copies this assay can detect is 138 copies/mL. A negative result does not preclude SARS-Cov-2 infection and should not be used as the sole basis for treatment or other patient management decisions. A negative result may occur with  improper specimen collection/handling, submission of specimen other than nasopharyngeal swab, presence of viral mutation(s) within the areas targeted by  this assay, and inadequate number of viral copies(<138 copies/mL). A negative result must be combined with clinical observations, patient history, and epidemiological information. The expected result is Negative.  Fact Sheet for Patients:  EntrepreneurPulse.com.au  Fact Sheet for Healthcare Providers:  IncredibleEmployment.be  This test is no t yet approved or cleared by the [place] FDA and  has been authorized for detection and/or diagnosis of SARS-CoV-2 by FDA under an Emergency Use Authorization (EUA). This EUA will remain  in effect (meaning this test can be used) for the duration of the COVID-19 declaration under Section 564(b)(1) of the Act, 21 U.S.C.section 360bbb-3(b)(1), unless the authorization is terminated  or revoked sooner.       Influenza A by PCR NEGATIVE NEGATIVE Final   Influenza B by PCR NEGATIVE NEGATIVE Final    Comment: (NOTE) The Xpert Xpress SARS-CoV-2/FLU/RSV plus assay is intended as an aid in the diagnosis of influenza from Nasopharyngeal swab specimens and should not be used as a sole basis for  treatment. Nasal washings and aspirates are unacceptable for Xpert Xpress SARS-CoV-2/FLU/RSV testing.  Fact Sheet for Patients: EntrepreneurPulse.com.au  Fact Sheet for Healthcare Providers: IncredibleEmployment.be  This test is not yet approved or cleared by the [place] FDA and has been authorized for detection and/or diagnosis of SARS-CoV-2 by FDA under an Emergency Use Authorization (EUA). This EUA will remain in effect (meaning this test can be used) for the duration of the COVID-19 declaration under Section 564(b)(1) of the Act, 21 U.S.C. section 360bbb-3(b)(1), unless the authorization is terminated or revoked.  Performed at [facility], [place] [address] [place], [place] [address]   Urine Culture     Status: Abnormal   Collection Time: 11/08/20 12:40 AM   Specimen: In/Out Cath Urine  Result Value Ref Range Status   Specimen Description IN/OUT CATH URINE  Final   Special Requests NONE  Final   Culture (A)  Final    >=100,000 COLONIES/mL STREPTOCOCCUS AGALACTIAE TESTING AGAINST S. AGALACTIAE NOT ROUTINELY PERFORMED DUE TO PREDICTABILITY OF AMP/PEN/VAN SUSCEPTIBILITY. Performed at [facility], [place] [address] [place], [place] [address]    Report Status 11/09/2020 FINAL  Final          Radiology Studies: DG Abd 1 View  Result Date: 11/09/2020 CLINICAL DATA:  Constipation EXAM: ABDOMEN - 1 VIEW COMPARISON:  03/04/2010 FINDINGS: Supine frontal view of the abdomen and pelvis excludes the pubic symphysis and hemidiaphragms by collimation. The bowel gas pattern is unremarkable without obstruction or ileus. Minimal retained stool within the colon without significant constipation. No masses or abnormal calcifications. No acute bony abnormalities. IMPRESSION: 1. Unremarkable bowel gas pattern. Electronically Signed   By: Randa Ngo M.D.   On: 11/09/2020 18:12        Scheduled Meds:  cephALEXin  500 mg Oral Q8H    docusate sodium  100 mg Oral BID   enoxaparin (LOVENOX) injection  30 mg Subcutaneous Q24H   haloperidol lactate  0.5 mg Intravenous Once   Continuous Infusions:  sodium chloride Stopped (11/07/20 1128)     LOS: 4 days    Time spent: 35 minutes.     Elmarie Shiley, MD [facility]   If 7PM-7AM, please contact night-coverage [email or web address]  11/11/2020, 3:09 PM

## 2020-11-11 NOTE — Progress Notes (Signed)
Patient sitting at bedside. Not speaking.  Occasionally will cry. Shaking both arms with eyes closed.

## 2020-11-11 NOTE — Progress Notes (Signed)
Patient more alert and interacting with staff at present. Not speaking in full sentences but responding. Supportive friend at bedside.

## 2020-11-12 DIAGNOSIS — F4310 Post-traumatic stress disorder, unspecified: Secondary | ICD-10-CM | POA: Diagnosis not present

## 2020-11-12 DIAGNOSIS — E0590 Thyrotoxicosis, unspecified without thyrotoxic crisis or storm: Secondary | ICD-10-CM | POA: Diagnosis not present

## 2020-11-12 DIAGNOSIS — F319 Bipolar disorder, unspecified: Secondary | ICD-10-CM | POA: Diagnosis not present

## 2020-11-12 MED ORDER — PROPRANOLOL HCL 10 MG PO TABS
5.0000 mg | ORAL_TABLET | Freq: Two times a day (BID) | ORAL | Status: DC
  Administered 2020-11-12: 5 mg via ORAL
  Filled 2020-11-12 (×2): qty 0.5

## 2020-11-12 MED ORDER — QUETIAPINE FUMARATE 25 MG PO TABS: 25.0000 mg | ORAL_TABLET | Freq: Every day | ORAL | 0 refills | Status: DC

## 2020-11-12 MED ORDER — CEPHALEXIN 500 MG PO CAPS: 500.0000 mg | ORAL_CAPSULE | Freq: Three times a day (TID) | ORAL | 0 refills | Status: AC

## 2020-11-12 MED ORDER — QUETIAPINE FUMARATE 25 MG PO TABS: 25.0000 mg | ORAL_TABLET | Freq: Every day | ORAL | Status: DC

## 2020-11-12 MED ORDER — PROPRANOLOL HCL 10 MG PO TABS: 5.0000 mg | ORAL_TABLET | Freq: Two times a day (BID) | ORAL | 0 refills | Status: DC

## 2020-11-12 NOTE — Consult Note (Addendum)
[facility] New Psychiatric Evaluation   Service Date: November 12, 2020 LOS:  LOS: 5 days    Assessment  Veronica Perez is a 45 y.o. female admitted medically for 11/06/2020  1:00 PM for seizurelike activity. She carries the psychiatric diagnoses of bipolar disorder and has a past medical history of  uncontrolled hyperthyroidism. Psychiatry was consulted for agitation, capacity to leave AMA, and need for inpt psych hospitalization by [name], MD.    Her current presentation of vague paranoia, poor sleep and worsening anxiety is most consistent with known diagnosis of bipolar disease complicated by uncontrolled hyperthyroidism although first hospitalization seems to predate diagnosis of hyperthyroidism. Current outpatient psychotropic medications include latuda and historically she has had a fair response to these medications; she is not compliant with any psychotropic medication. On initial examination, patient answered my questions appropriately and denied any thoughts of self-harm or intent to harm others; her fiancee and mother . She does not meet criteria for involuntary psychiatric hospitalization in the state of [place]. Although she has some vague paranoia and some psychotic thought processes and likely does not have capacity to leave AMA, fiancee (most likely surrogate decision maker) is also against keeping pt in hospital against her will. At the time I saw the pt, neurology stated that they had no further inpatient neurological workup. I have reviewed past several psychiatric notes; while patient is not at her baseline she does not seem as acutely ill psychiatrically as in prior presentations.  I do think she is at high risk of decompensating to the point that she requires involuntary treatment however (as above) she does not meet criteria for IVC In [place] as she is not an immediate threat to herself or others.   10/6: asked to see patient again as pt interested  in talking about medications. Discussed abilify with option to bridge to LAI, used its use in ADHD/depression to help get pt and fiancee buy in. Pt open to taking aripiprazole; fiancee hopeful this will bridge to LAI.   10/7: asked to see pt in early afternoon - mute and almost catatonic appearing (no rigidity, no vs abnormality, has not stopped eating - more parakinetic). Seems overall more like stress reaction after being in hospital w/ hx medical trauma - trying OTO lorazepam to assess for response. She did not have a response with this med; will be reassessed by team tomorrow. D/w fiancee it can take longer to have an effect orally, discuss with team tomorrow if she does start to respond after I leave. Akathisia also on differential although difficult to assess as pt nonverbal.   10/10: was seen over the weekend by Ricky Ala; also felt like pt did not meet psychiatric criteria. Today discussed extensively with pt and fiancee with Dr. Tyrell Antonio at bedside (who has attempted to discharge 2-3 times with pt and fiancee generally endorsing new symptoms or otherwise requesting additional hospital days without consenting to earlier proposed treatments including radioiodine or methimazole) that there is no medical reason to stay in the hospital. I do believe that many current symptoms are due to PTSD (dissociation, hypervigilance, etc) and agree with my original assessment that involuntary psychiatric hospitalization is not required at this time.   Patient continues to be very minimally verbal in the presence of providers although has been eating and talking more with fiancee. Outpatient resources for both medication and psychiatry have been discussed extensively with patient and fiancee. We discussed that if they are unable to leave  the hospital today, we will interpret this as an inability to meet ADLs/maintain safety outside of the hospital and we will be pursuing psychiatric hospitalization; given pt's  difficulty engaging in r/b/se discussion this would need to be involuntary. Patient and fiancee agreed to discharge today with outpt followup in place.  Diagnoses:  Active Hospital problems: Principal Problem:   Seizure disorder [facility]) Active Problems:   PANIC ATTACKS   TOBACCO DEPENDENCE   Gastroesophageal reflux disease   Bipolar I disorder (HCC)   PTSD (post-traumatic stress disorder)   Hyperthyroidism    Problems edited/added by me: No problems updated.  Plan  ## Safety and Observation Level:  - Based on my clinical evaluation, I estimate the patient to be at low risk of self harm in the current setting - At this time, we recommend a routine level of observation. This decision is based on my review of the chart including patient's history and current presentation, interview of the patient, mental status examination, and consideration of suicide risk including evaluating suicidal ideation, plan, intent, suicidal or self-harm behaviors, risk factors, and protective factors. This judgment is based on our ability to directly address suicide risk, implement suicide prevention strategies and develop a safety plan while the patient is in the clinical setting. Please contact our team if there is a concern that risk level has changed.  ## Medications:  -- continue quetiapine 25 mg QHS started by NP LEwis  - unclear what is going on with pt at this time; low chance of contributing to catatonia. Would be open to restarting medication in future.   ## Medical Decision Making Capacity:  Not formally assessed today  ## Further Work-up:  -- outpt f/u with psychiatry (please put in information for walk-in hours at [facility]) -- needs outpt f/u with endocrinology (non compliant with methimazole, uncontrolled hyperthyroidism contributing to symptoms)  ## Disposition:  -- likely home with fiancee   ##Legal Status -- voluntary   Thank you for this consult request. Recommendations have been  communicated to the primary team.  We will continue to follow at this time.   Hemet A Doreatha Offer    NEW history  Relevant Aspects of Hospital Course:  Admitted on 11/06/2020 for seizurelike activity. Has been minimally compliant (ripped off EEG leads due to concerns over glue poisoning her).   After seeing yesterday, TSH came back low with elevated T3/T4.   Patient Report:  Patient seen in late AMat request of hospital team, had gotten 2 doses of abilify at this point. She was able to make better eye contact and answer direct questions. Overly focused on medical symptoms - focusing on scab in leg and EKG machine. Continues to have difficulty interacting with medical team but is reportedly better when with fiancee.   We discussed hospital stay extensively and options for further treatment. Dr. Tyrell Antonio joined conversation and we discussed that there was no medical reason to stay in the hospital, and that we needed to decide a) for inpatient psychiatric admission or b) to leave the hospital and go home. Ultimately decided to go home; discussed expected course (stress diathesis, improvement of symptoms) and reasons to come back into the hospital extensively with pt and fiancee.   No SI/HI/AH/VH; has not endorsed such to fiancee  ROS:  Unable to perform, pt mute.   Psychiatric History:  Information collected from pt, medical record Psych hospitalizations in 2014, 2021 and 2022 - mostly for mania On numerous antipsychotics, mood stabilizers, etc Generally not compliant with meds.  Family psych history: per chart review, bipolar disorder in mother.   Medical History: Past Medical History:  Diagnosis Date   Bipolar disorder ([place])    Depression    Former smoker, stopped smoking many years ago    HSV (herpes simplex virus) infection    Hypothyroid    Low TSH level 05/30/2016   Panic attacks    Thyroid disease     Surgical History: Past Surgical History:  Procedure Laterality  Date   ADENOIDECTOMY     OVARIAN CYST SURGERY     TONSILLECTOMY      Medications:   Current Facility-Administered Medications:    acetaminophen (TYLENOL) tablet 650 mg, 650 mg, Oral, Q6H PRN, 650 mg at 11/11/20 2254 **OR** acetaminophen (TYLENOL) suppository 650 mg, 650 mg, Rectal, Q6H PRN, Shawna Clamp, MD   docusate sodium (COLACE) capsule 100 mg, 100 mg, Oral, BID, Shawna Clamp, MD, 100 mg at 11/12/20 0915   enoxaparin (LOVENOX) injection 40 mg, 40 mg, Subcutaneous, Q24H, Kris Mouton, RPH, 40 mg at 11/12/20 0915   haloperidol lactate (HALDOL) injection 0.5 mg, 0.5 mg, Intravenous, Once, Regalado, Belkys A, MD   hydrALAZINE (APRESOLINE) injection 10 mg, 10 mg, Intravenous, Q8H PRN, Shawna Clamp, MD   LORazepam (ATIVAN) tablet 1 mg, 1 mg, Oral, Q6H PRN, Regalado, Belkys A, MD, 1 mg at 11/09/20 2109   ondansetron (ZOFRAN) tablet 4 mg, 4 mg, Oral, Q6H PRN **OR** ondansetron (ZOFRAN) injection 4 mg, 4 mg, Intravenous, Q6H PRN, Shawna Clamp, MD   propranolol (INDERAL) tablet 5 mg, 5 mg, Oral, BID, Regalado, Belkys A, MD, 5 mg at 11/12/20 1536  Allergies: Allergies  Allergen Reactions   Penicillins Hives, Itching and Nausea And Vomiting    Has patient had a PCN reaction causing immediate rash, facial/tongue/throat swelling, SOB or lightheadedness with hypotension: No Has patient had a PCN reaction causing severe rash involving mucus membranes or skin necrosis: No Has patient had a PCN reaction that required hospitalization: No Has patient had a PCN reaction occurring within the last 10 years: No If all of the above answers are "NO", then may proceed with Cephalosporin use.   Iodine    Lactose Intolerance (Gi)    Penicillins Hives and Nausea And Vomiting    Social History:  Lives with fiancee Has 68ish y/o daughter  Tobacco use: denies - quit a couple of months ago. Not using nicotine replacement outside of the hospital Alcohol use: denies Drug use: denies  Family  History:  The patient's family history includes Bipolar disorder in her mother; Cancer in her maternal grandmother; Diabetes in her father; Heart disease in her father.    Objective  Vital signs:  Temp:  [97.1 F (36.2 C)-98.4 F (36.9 C)] 97.8 F (36.6 C) (10/10 1223) Pulse Rate:  [80-90] 90 (10/10 1223) Resp:  [16-23] 18 (10/10 1223) BP: (119-154)/(64-90) 119/64 (10/10 0821) SpO2:  [97 %-100 %] 98 % (10/10 1223)  Physical Exam: Gen: Alert, oriented, in no acute distress. Some pacing, less shaking back and forth than previously  Neuro: CNII-XII grossly intact. Pulm: no increased work of breathing Skin: no rash noted. Scab on R knee.   Mental Status Exam: (reflecting first exam) Appearance: Awake, well groomed, hair up  Attitude:  Reticent, scared  Behavior/Psychomotor: Mild tremor noted  Speech/Language:  Soft, paucity   Mood: nervous  Affect: Scared/congruent  Thought process: Some irrelevant words/details included, mostly d/t pt's hyperfocus on medical issues.   Thought content:   Devoid of SI/HI  Perceptual disturbances:  Did not endorse, not RIS  Attention: Attended to interview.   Concentration: poor  Orientation: Difficult to assess (pt near mute), at minimum oriented to self and situation  Memory: Could not assess  Fund of knowledge:  Could not assess  Insight:   poor  Judgment:  poor  Impulse Control: poor     Data Reviewed: Chart   I personally spent 35 minutes on the unit in direct patient care. The direct patient care time included face-to-face time with the patient, reviewing the patient's chart, communicating with other professionals, and coordinating care. Greater than 50% of this time was spent in counseling or coordinating care with the patient regarding goals of hospitalization, psycho-education, and discharge planning needs.

## 2020-11-12 NOTE — Discharge Summary (Addendum)
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 1223  BP: 119/64   Pulse: 83 90  Resp: 20 18  Temp: 98.4 F (36.9 C) 97.8 F (36.6 C)  SpO2: 98% 98%     General: Pt is alert, awake, anxious.  Cardiovascular: RRR, S1/S2 +, no rubs, no gallops Respiratory: CTA bilaterally, no wheezing, no rhonchi Abdominal: Soft, NT, ND, bowel sounds + Extremities: no edema, no cyanosis    The results of significant diagnostics from this hospitalization (including imaging, microbiology, ancillary and laboratory) are listed below for reference.     Microbiology: Recent Results (from the past 240 hour(s))  Resp Panel by RT-PCR (Flu A&B, Covid) Nasopharyngeal Swab     Status: None   Collection Time: 11/06/20  5:01 PM   Specimen: Nasopharyngeal Swab; Nasopharyngeal(NP) swabs in vial transport medium  Result Value Ref Range Status   SARS Coronavirus 2 by RT PCR NEGATIVE NEGATIVE Final    Comment: (NOTE) SARS-CoV-2 target nucleic acids are NOT DETECTED.  The SARS-CoV-2 RNA is generally detectable in upper respiratory specimens during the  acute phase of infection. The lowest concentration of SARS-CoV-2 viral copies this assay can detect is 138 copies/mL. A negative result does not preclude SARS-Cov-2 infection and should not be used as the sole basis for treatment or other patient management decisions. A negative result may occur with  improper specimen collection/handling, submission of specimen other than nasopharyngeal swab, presence of viral mutation(s) within the areas targeted by this assay, and inadequate number of viral copies(<138 copies/mL). A negative result must be combined with clinical observations, patient history, and epidemiological information. The expected result is Negative.  Fact Sheet for Patients:  EntrepreneurPulse.com.au  Fact Sheet for Healthcare Providers:  IncredibleEmployment.be  This test is no t yet approved or cleared by the [place] FDA and  has been authorized for detection and/or diagnosis of SARS-CoV-2 by FDA under an Emergency Use Authorization (EUA). This EUA will remain  in effect (meaning this test can be used) for the duration of the COVID-19 declaration under Section 564(b)(1) of the Act, 21 U.S.C.section 360bbb-3(b)(1), unless the authorization is terminated  or revoked sooner.       Influenza A by PCR NEGATIVE NEGATIVE Final   Influenza B by PCR NEGATIVE NEGATIVE Final    Comment: (NOTE) The Xpert Xpress SARS-CoV-2/FLU/RSV plus assay is intended as an aid in the diagnosis of influenza  from Nasopharyngeal swab specimens and should not be used as a sole basis for treatment. Nasal washings and aspirates are unacceptable for Xpert Xpress SARS-CoV-2/FLU/RSV testing.  Fact Sheet for Patients: EntrepreneurPulse.com.au  Fact Sheet for Healthcare Providers: IncredibleEmployment.be  This test is not yet approved or cleared by the [place] FDA and has been authorized for detection and/or  diagnosis of SARS-CoV-2 by FDA under an Emergency Use Authorization (EUA). This EUA will remain in effect (meaning this test can be used) for the duration of the COVID-19 declaration under Section 564(b)(1) of the Act, 21 U.S.C. section 360bbb-3(b)(1), unless the authorization is terminated or revoked.  Performed at [facility], [place] [address] [place], [place] [address]   Urine Culture     Status: Abnormal   Collection Time: 11/08/20 12:40 AM   Specimen: In/Out Cath Urine  Result Value Ref Range Status   Specimen Description IN/OUT CATH URINE  Final   Special Requests NONE  Final   Culture (A)  Final    >=100,000 COLONIES/mL STREPTOCOCCUS AGALACTIAE TESTING AGAINST S. AGALACTIAE NOT ROUTINELY PERFORMED DUE TO PREDICTABILITY OF AMP/PEN/VAN SUSCEPTIBILITY. Performed at [facility], [place] [address], [place], [place] [address]    Report Status 11/09/2020 FINAL  Final     Labs: BNP (last 3 results) No results for input(s): BNP in the last 8760 hours. Basic Metabolic Panel: Recent Labs  Lab 11/06/20 1327 11/07/20 0522 11/10/20 1913 11/11/20 0154  NA 137 137 138 135  K 4.3 3.6 3.9 3.8  CL 107 106 109 106  CO2 23 25 22  19*  GLUCOSE 102* 95 90 110*  BUN 13 9 10 11   CREATININE 0.69 0.72 0.61 0.59  CALCIUM 8.8* 8.4* 9.0 9.1   Liver Function Tests: Recent Labs  Lab 11/06/20 1327 11/07/20 0522  AST 13* 13*  ALT 14 15  ALKPHOS 69 58  BILITOT 0.6 0.5  PROT 6.7 6.4*  ALBUMIN 3.7 3.5   No results for input(s): LIPASE, AMYLASE in the last 168 hours. No results for input(s): AMMONIA in the last 168 hours. CBC: Recent Labs  Lab 11/06/20 1327 11/07/20 0522 11/11/20 0154  WBC 9.5 11.3* 8.6  NEUTROABS 7.1  --   --   HGB 13.4 12.8 13.7  HCT 40.3 38.9 40.6  MCV 85.4 86.6 83.5  PLT 328 291 300   Cardiac Enzymes: No results for input(s): CKTOTAL, CKMB, CKMBINDEX, TROPONINI in the last 168 hours. BNP: Invalid input(s): POCBNP CBG: Recent Labs  Lab  11/10/20 1849  GLUCAP 81   D-Dimer No results for input(s): DDIMER in the last 72 hours. Hgb A1c No results for input(s): HGBA1C in the last 72 hours. Lipid Profile No results for input(s): CHOL, HDL, LDLCALC, TRIG, CHOLHDL, LDLDIRECT in the last 72 hours. Thyroid function studies No results for input(s): TSH, T4TOTAL, T3FREE, THYROIDAB in the last 72 hours.  Invalid input(s): FREET3 Anemia work up No results for input(s): VITAMINB12, FOLATE, FERRITIN, TIBC, IRON, RETICCTPCT in the last 72 hours. Urinalysis    Component Value Date/Time   COLORURINE STRAW (A) 11/09/2020 2050   APPEARANCEUR CLEAR 11/09/2020 2050   LABSPEC 1.012 11/09/2020 2050   PHURINE 5.0 11/09/2020 2050   GLUCOSEU NEGATIVE 11/09/2020 2050   HGBUR NEGATIVE 11/09/2020 2050   HGBUR trace-intact 03/01/2010 [place] NEGATIVE 11/09/2020 2050   [place] 11/09/2020 2050   PROTEINUR NEGATIVE 11/09/2020 2050   UROBILINOGEN 0.2 03/11/2018 1722   NITRITE NEGATIVE 11/09/2020 2050   LEUKOCYTESUR NEGATIVE 11/09/2020 2050  Sepsis Labs Invalid input(s): PROCALCITONIN,  WBC,  LACTICIDVEN Microbiology Recent Results (from the past 240 hour(s))  Resp Panel by RT-PCR (Flu A&B, Covid) Nasopharyngeal Swab     Status: None   Collection Time: 11/06/20  5:01 PM   Specimen: Nasopharyngeal Swab; Nasopharyngeal(NP) swabs in vial transport medium  Result Value Ref Range Status   SARS Coronavirus 2 by RT PCR NEGATIVE NEGATIVE Final    Comment: (NOTE) SARS-CoV-2 target nucleic acids are NOT DETECTED.  The SARS-CoV-2 RNA is generally detectable in upper respiratory specimens during the acute phase of infection. The lowest concentration of SARS-CoV-2 viral copies this assay can detect is 138 copies/mL. A negative result does not preclude SARS-Cov-2 infection and should not be used as the sole basis for treatment or other patient management decisions. A negative result may occur with  improper specimen  collection/handling, submission of specimen other than nasopharyngeal swab, presence of viral mutation(s) within the areas targeted by this assay, and inadequate number of viral copies(<138 copies/mL). A negative result must be combined with clinical observations, patient history, and epidemiological information. The expected result is Negative.  Fact Sheet for Patients:  EntrepreneurPulse.com.au  Fact Sheet for Healthcare Providers:  IncredibleEmployment.be  This test is no t yet approved or cleared by the [place] FDA and  has been authorized for detection and/or diagnosis of SARS-CoV-2 by FDA under an Emergency Use Authorization (EUA). This EUA will remain  in effect (meaning this test can be used) for the duration of the COVID-19 declaration under Section 564(b)(1) of the Act, 21 U.S.C.section 360bbb-3(b)(1), unless the authorization is terminated  or revoked sooner.       Influenza A by PCR NEGATIVE NEGATIVE Final   Influenza B by PCR NEGATIVE NEGATIVE Final    Comment: (NOTE) The Xpert Xpress SARS-CoV-2/FLU/RSV plus assay is intended as an aid in the diagnosis of influenza from Nasopharyngeal swab specimens and should not be used as a sole basis for treatment. Nasal washings and aspirates are unacceptable for Xpert Xpress SARS-CoV-2/FLU/RSV testing.  Fact Sheet for Patients: EntrepreneurPulse.com.au  Fact Sheet for Healthcare Providers: IncredibleEmployment.be  This test is not yet approved or cleared by the [place] FDA and has been authorized for detection and/or diagnosis of SARS-CoV-2 by FDA under an Emergency Use Authorization (EUA). This EUA will remain in effect (meaning this test can be used) for the duration of the COVID-19 declaration under Section 564(b)(1) of the Act, 21 U.S.C. section 360bbb-3(b)(1), unless the authorization is terminated or revoked.  Performed at [facility], [place] [address], [place], [place] [address]   Urine Culture     Status: Abnormal   Collection Time: 11/08/20 12:40 AM   Specimen: In/Out Cath Urine  Result Value Ref Range Status   Specimen Description IN/OUT CATH URINE  Final   Special Requests NONE  Final   Culture (A)  Final    >=100,000 COLONIES/mL STREPTOCOCCUS AGALACTIAE TESTING AGAINST S. AGALACTIAE NOT ROUTINELY PERFORMED DUE TO PREDICTABILITY OF AMP/PEN/VAN SUSCEPTIBILITY. Performed at [facility], [place] [address], [place], [place] [address]    Report Status 11/09/2020 FINAL  Final     Time coordinating discharge: 40 minutes  SIGNED:   Elmarie Shiley, MD  [facility]

## 2020-11-12 NOTE — Progress Notes (Signed)
Pt was withdrawn, arms and hands trembling, no eye contact, kept eyes mostly closed for beginning of shift. Would communicate with fiance Mr [place] with minimal hand gestures, thumb up or down,he could get her to drink water and swallow pills. When asked pt if she was in pain, she whimpers and teared up. Tylenol was given. Pt able to rest comfortable with fiance at side holding her. Walked to bathroom with 1 assist. This morning after waking, she was much more interactive, slowly saying a few words, opening eyes, thanked nurse for helping her. Verbally expressed need for a stool softener. Pt and fiance were updated that a stool softener was ordered for this am. At time of shift change report , pt was sitting on side of bed - straightening shirt of fiance, eyes fully open and attentive to surroundings.

## 2020-11-12 NOTE — Progress Notes (Signed)
Spoke to pt's mother in regards to new medication ordered by physician. Everlene Farrier (pt's mother) in agreement and understood to go pick up new medication from [employer].

## 2020-11-13 ENCOUNTER — Other Ambulatory Visit (HOSPITAL_BASED_OUTPATIENT_CLINIC_OR_DEPARTMENT_OTHER): Payer: Self-pay | Admitting: Internal Medicine

## 2020-11-13 ENCOUNTER — Other Ambulatory Visit (HOSPITAL_COMMUNITY): Payer: Self-pay | Admitting: Internal Medicine

## 2020-11-24 ENCOUNTER — Other Ambulatory Visit: Payer: Self-pay

## 2020-11-24 ENCOUNTER — Encounter (HOSPITAL_COMMUNITY): Payer: Self-pay

## 2020-11-24 ENCOUNTER — Emergency Department (EMERGENCY_DEPARTMENT_HOSPITAL)
Admission: EM | Admit: 2020-11-24 | Discharge: 2020-11-26 | Disposition: A | Discharge status: Other Acute Inpatient Hospital | Payer: 59 | Source: Home / Self Care | Attending: Emergency Medicine | Admitting: Emergency Medicine

## 2020-11-24 DIAGNOSIS — F17210 Nicotine dependence, cigarettes, uncomplicated: Secondary | ICD-10-CM | POA: Insufficient documentation

## 2020-11-24 DIAGNOSIS — F4310 Post-traumatic stress disorder, unspecified: Secondary | ICD-10-CM | POA: Diagnosis present

## 2020-11-24 DIAGNOSIS — F312 Bipolar disorder, current episode manic severe with psychotic features: Secondary | ICD-10-CM | POA: Diagnosis present

## 2020-11-24 DIAGNOSIS — F29 Unspecified psychosis not due to a substance or known physiological condition: Secondary | ICD-10-CM

## 2020-11-24 DIAGNOSIS — E039 Hypothyroidism, unspecified: Secondary | ICD-10-CM | POA: Insufficient documentation

## 2020-11-24 DIAGNOSIS — F4389 Other reactions to severe stress: Secondary | ICD-10-CM | POA: Insufficient documentation

## 2020-11-24 DIAGNOSIS — F209 Schizophrenia, unspecified: Secondary | ICD-10-CM | POA: Diagnosis not present

## 2020-11-24 DIAGNOSIS — Z20822 Contact with and (suspected) exposure to covid-19: Secondary | ICD-10-CM | POA: Insufficient documentation

## 2020-11-24 DIAGNOSIS — F259 Schizoaffective disorder, unspecified: Secondary | ICD-10-CM | POA: Insufficient documentation

## 2020-11-24 DIAGNOSIS — Y900 Blood alcohol level of less than 20 mg/100 ml: Secondary | ICD-10-CM | POA: Insufficient documentation

## 2020-11-24 DIAGNOSIS — Z79899 Other long term (current) drug therapy: Secondary | ICD-10-CM | POA: Insufficient documentation
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
## 2020-11-25 DIAGNOSIS — F259 Schizoaffective disorder, unspecified: Secondary | ICD-10-CM | POA: Diagnosis present

## 2020-11-25 LAB — DIFFERENTIAL
Abs Immature Granulocytes: 0.04 10*3/uL (ref 0.00–0.07)
Basophils Absolute: 0.1 10*3/uL (ref 0.0–0.1)
Basophils Relative: 0 %
Eosinophils Absolute: 0.1 10*3/uL (ref 0.0–0.5)
Eosinophils Relative: 1 %
Immature Granulocytes: 0 %
Lymphocytes Relative: 28 %
Lymphs Abs: 3.8 10*3/uL (ref 0.7–4.0)
Monocytes Absolute: 0.7 10*3/uL (ref 0.1–1.0)
Monocytes Relative: 5 %
Neutro Abs: 8.7 10*3/uL — ABNORMAL HIGH (ref 1.7–7.7)
Neutrophils Relative %: 66 %

## 2020-11-25 LAB — RESP PANEL BY RT-PCR (FLU A&B, COVID) ARPGX2
Influenza A by PCR: NEGATIVE
Influenza B by PCR: NEGATIVE
SARS Coronavirus 2 by RT PCR: NEGATIVE

## 2020-11-25 LAB — COMPREHENSIVE METABOLIC PANEL
ALT: 18 U/L (ref 0–44)
AST: 15 U/L (ref 15–41)
Albumin: 3.9 g/dL (ref 3.5–5.0)
Alkaline Phosphatase: 57 U/L (ref 38–126)
Anion gap: 7 (ref 5–15)
BUN: 8 mg/dL (ref 6–20)
CO2: 25 mmol/L (ref 22–32)
Calcium: 8.7 mg/dL — ABNORMAL LOW (ref 8.9–10.3)
Chloride: 104 mmol/L (ref 98–111)
Creatinine, Ser: 0.65 mg/dL (ref 0.44–1.00)
GFR, Estimated: >60 mL/min (ref >60)
Glucose, Bld: 88 mg/dL (ref 70–99)
Potassium: 3.3 mmol/L — ABNORMAL LOW (ref 3.5–5.1)
Sodium: 136 mmol/L (ref 135–145)
Total Bilirubin: 0.7 mg/dL (ref 0.3–1.2)
Total Protein: 7.1 g/dL (ref 6.5–8.1)

## 2020-11-25 LAB — SALICYLATE LEVEL: Salicylate Lvl: <7 mg/dL — ABNORMAL LOW (ref 7.0–30.0)

## 2020-11-25 LAB — I-STAT BETA HCG BLOOD, ED (MC, WL, AP ONLY): I-stat hCG, quantitative: <5 m[IU]/mL (ref <5)

## 2020-11-25 LAB — CBC
HCT: 38.3 % (ref 36.0–46.0)
Hemoglobin: 12.9 g/dL (ref 12.0–15.0)
MCH: 28.3 pg (ref 26.0–34.0)
MCHC: 33.7 g/dL (ref 30.0–36.0)
MCV: 84 fL (ref 80.0–100.0)
Platelets: 365 10*3/uL (ref 150–400)
RBC: 4.56 MIL/uL (ref 3.87–5.11)
RDW: 12.4 % (ref 11.5–15.5)
WBC: 13.6 10*3/uL — ABNORMAL HIGH (ref 4.0–10.5)
nRBC: 0 % (ref 0.0–0.2)

## 2020-11-25 LAB — T4, FREE: Free T4: 1.35 ng/dL — ABNORMAL HIGH (ref 0.61–1.12)

## 2020-11-25 LAB — ETHANOL: Alcohol, Ethyl (B): <10 mg/dL (ref <10)

## 2020-11-25 LAB — TSH: TSH: <0.01 u[IU]/mL — ABNORMAL LOW (ref 0.350–4.500)

## 2020-11-25 LAB — ACETAMINOPHEN LEVEL: Acetaminophen (Tylenol), Serum: <10 ug/mL — ABNORMAL LOW (ref 10–30)

## 2020-11-25 MED ORDER — OLANZAPINE 10 MG PO TBDP
10.0000 mg | ORAL_TABLET | Freq: Every day | ORAL | Status: DC
  Administered 2020-11-26: 10 mg via ORAL
  Filled 2020-11-25 (×2): qty 1

## 2020-11-25 NOTE — BH Assessment (Signed)
Received TTS consult order. Per RN, Pt is currently somnolent and unable to participate in assessment.   Evelena Peat, [facility], [facility] Triage Specialist [phone]

## 2020-11-25 NOTE — ED Notes (Signed)
Pt on the phone with her mother arguing about a hand sanitizer bottle being her her daughters room. Pt is convinced that the alcohol fumes are coming through the pump nozzle and poisoning her child. Pt states she didn't have a choice to open all the windows in the house to let the fumes out. Pt is angry that her mother wont take the hand sanitizer out of the house. She continues to talk about how the fumes are coming through the nozzle and the nozzle needs to be covered with a Ziploc bag. Pt very fixated on hand sanitizer not being in her house.

## 2020-11-25 NOTE — ED Notes (Signed)
Pt meal tray given to her. Pt covered her nose and continued to stay laying down.

## 2020-11-25 NOTE — ED Notes (Signed)
Pt sitting up on stretcher eating her dinner.
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
## 2020-11-25 NOTE — ED Provider Notes (Signed)
Emergency Medicine Observation Re-evaluation Note  Veronica Perez is a 45 y.o. female, seen on rounds today.  Pt initially presented to the ED for complaints of IVC Pt with hx schizoaffective disorder.  By report, not eating/drinking well, not attending to ADLS, hygiene, etc due to paranoid thoughts, 'fear of soap' given as one example, and withdrawn/odd behavior.  No new c/o this AM.   Physical Exam  BP 129/73 (BP Location: Left Arm)   Pulse 85   Temp 98.3 F (36.8 C) (Oral)   Resp 16   Ht 1.626 m (5\' 4" )   Wt 77.2 kg   SpO2 95%   BMI 29.23 kg/m  Physical Exam General: calm, awake, very minimal response to questions, appears withdrawn. Cardiac: regular rate Lungs: breathing comfortably Psych: depressed mood, flat affect. Does not express thoughts of harm to self or others.  At times, does appear may be responding to internal stimuli, ?listening to voices. Occasionally expresses brief paranoid thought/ideation.   ED Course / MDM    I have reviewed the labs performed to date as well as medications administered while in observation.  Recent changes in the last 24 hours include ED obs and reassessment.   Plan  Plan is for [facility] reassessment this AM - possible inpatient psych placement. Will also ask to assess BH meds.        Lajean Saver, MD 11/25/20 [phone]

## 2020-11-25 NOTE — ED Notes (Signed)
Pt put pill in her mouth then spit down the straw in her drink. Documented as refused.

## 2020-11-25 NOTE — Progress Notes (Signed)
Per Suzzanne Cloud, patient meets criteria for inpatient treatment. There are no available or appropriate beds at [facility] today. CSW faxed referrals to the following facilities for review:  [facility]  Pending - No Request Sent N/A [address] [address] [place] [address] [phone] --  [place] N/A [address] [place] [address] [phone] --  [facility]  Pending - No Request Sent N/A [address] [place] [address] [phone] --  [facility]  Pending - No Request Sent N/A [address] [place] [address] [phone] --  [facility]  Pending - No Request Sent N/A [address] [place] [address] [phone] --  [facility]  Pending - No Request Sent N/A [place] [place] [address] [phone] --  [place]  Pending - No Request Sent N/A [name], [place] [address] [phone] --  [facility]  Pending - No Request Sent N/A [place], [place] [address] [place] --  [place]  Pending - No Request Sent N/A [place] [place] [address] [phone] --  [facility]  Pending - No Request Sent N/A [address] [place] [address] [phone] --  [facility]  Pending - No Request Sent N/A [address], [place] [address] [phone] --  [facility]  Pending - No Request Sent N/A [address] [place] [address] [phone] --  [place]  Pending - No Request Sent N/A [place], [place]  [address] [phone] --    TTS will  continue to seek bed placement.  Glennie [place], MSW, [place], LCAS-A Phone: [phone] Disposition/TOC
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
## 2020-11-25 NOTE — ED Notes (Signed)
Pt belongings placed in personal belongings bag in triage nurses station in cabinet labeled patient belonings
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
## 2020-11-26 ENCOUNTER — Other Ambulatory Visit: Payer: Self-pay

## 2020-11-26 ENCOUNTER — Inpatient Hospital Stay (HOSPITAL_COMMUNITY)
Admission: AD | Admit: 2020-11-26 | Discharge: 2020-12-15 | DRG: 885 | Disposition: A | Discharge status: Home / Self Care | Payer: 59 | Source: Intra-hospital | Attending: Emergency Medicine | Admitting: Emergency Medicine

## 2020-11-26 ENCOUNTER — Encounter (HOSPITAL_COMMUNITY): Payer: Self-pay | Admitting: Family

## 2020-11-26 DIAGNOSIS — Z880 Allergy status to penicillin: Secondary | ICD-10-CM

## 2020-11-26 DIAGNOSIS — Z79899 Other long term (current) drug therapy: Secondary | ICD-10-CM

## 2020-11-26 DIAGNOSIS — R41843 Psychomotor deficit: Secondary | ICD-10-CM | POA: Diagnosis present

## 2020-11-26 DIAGNOSIS — F410 Panic disorder [episodic paroxysmal anxiety] without agoraphobia: Secondary | ICD-10-CM | POA: Diagnosis present

## 2020-11-26 DIAGNOSIS — F259 Schizoaffective disorder, unspecified: Secondary | ICD-10-CM | POA: Diagnosis present

## 2020-11-26 DIAGNOSIS — F061 Catatonic disorder due to known physiological condition: Secondary | ICD-10-CM | POA: Diagnosis present

## 2020-11-26 DIAGNOSIS — F17210 Nicotine dependence, cigarettes, uncomplicated: Secondary | ICD-10-CM | POA: Diagnosis present

## 2020-11-26 DIAGNOSIS — Z9114 Patient's other noncompliance with medication regimen: Secondary | ICD-10-CM

## 2020-11-26 DIAGNOSIS — S32029D Unspecified fracture of second lumbar vertebra, subsequent encounter for fracture with routine healing: Secondary | ICD-10-CM | POA: Diagnosis not present

## 2020-11-26 DIAGNOSIS — F4310 Post-traumatic stress disorder, unspecified: Secondary | ICD-10-CM | POA: Diagnosis present

## 2020-11-26 DIAGNOSIS — B009 Herpesviral infection, unspecified: Secondary | ICD-10-CM | POA: Diagnosis present

## 2020-11-26 DIAGNOSIS — E0520 Thyrotoxicosis with toxic multinodular goiter without thyrotoxic crisis or storm: Secondary | ICD-10-CM | POA: Diagnosis present

## 2020-11-26 DIAGNOSIS — F3164 Bipolar disorder, current episode mixed, severe, with psychotic features: Secondary | ICD-10-CM

## 2020-11-26 DIAGNOSIS — L089 Local infection of the skin and subcutaneous tissue, unspecified: Secondary | ICD-10-CM | POA: Diagnosis present

## 2020-11-26 DIAGNOSIS — F312 Bipolar disorder, current episode manic severe with psychotic features: Secondary | ICD-10-CM | POA: Diagnosis present

## 2020-11-26 DIAGNOSIS — Z91048 Other nonmedicinal substance allergy status: Secondary | ICD-10-CM

## 2020-11-26 DIAGNOSIS — F209 Schizophrenia, unspecified: Secondary | ICD-10-CM | POA: Diagnosis present

## 2020-11-26 DIAGNOSIS — R45850 Homicidal ideations: Secondary | ICD-10-CM | POA: Diagnosis present

## 2020-11-26 DIAGNOSIS — Z818 Family history of other mental and behavioral disorders: Secondary | ICD-10-CM | POA: Diagnosis not present

## 2020-11-26 DIAGNOSIS — K5900 Constipation, unspecified: Secondary | ICD-10-CM | POA: Diagnosis present

## 2020-11-26 DIAGNOSIS — E039 Hypothyroidism, unspecified: Secondary | ICD-10-CM | POA: Diagnosis present

## 2020-11-26 DIAGNOSIS — E739 Lactose intolerance, unspecified: Secondary | ICD-10-CM | POA: Diagnosis present

## 2020-11-26 DIAGNOSIS — D3501 Benign neoplasm of right adrenal gland: Secondary | ICD-10-CM | POA: Diagnosis present

## 2020-11-26 DIAGNOSIS — E781 Pure hyperglyceridemia: Secondary | ICD-10-CM | POA: Diagnosis present

## 2020-11-26 DIAGNOSIS — F250 Schizoaffective disorder, bipolar type: Secondary | ICD-10-CM

## 2020-11-26 LAB — RAPID URINE DRUG SCREEN, HOSP PERFORMED
Amphetamines: NOT DETECTED
Barbiturates: NOT DETECTED
Benzodiazepines: NOT DETECTED
Cocaine: NOT DETECTED
Opiates: NOT DETECTED
Tetrahydrocannabinol: NOT DETECTED

## 2020-11-26 MED ORDER — OLANZAPINE 10 MG PO TBDP
10.0000 mg | ORAL_TABLET | Freq: Every day | ORAL | Status: DC
  Filled 2020-11-26 (×4): qty 1

## 2020-11-26 MED ORDER — METOPROLOL TARTRATE 25 MG PO TABS
25.0000 mg | ORAL_TABLET | Freq: Two times a day (BID) | ORAL | Status: DC
  Administered 2020-11-27 – 2020-12-03 (×12): 25 mg via ORAL
  Filled 2020-11-26 (×20): qty 1

## 2020-11-26 MED ORDER — ALUM & MAG HYDROXIDE-SIMETH 200-200-20 MG/5ML PO SUSP: 30.0000 mL | ORAL | Status: DC | PRN

## 2020-11-26 MED ORDER — OLANZAPINE 10 MG IM SOLR
10.0000 mg | Freq: Two times a day (BID) | INTRAMUSCULAR | Status: DC | PRN
  Filled 2020-11-26: qty 10

## 2020-11-26 MED ORDER — MAGNESIUM HYDROXIDE 400 MG/5ML PO SUSP
30.0000 mL | Freq: Every day | ORAL | Status: DC | PRN
  Administered 2020-12-01 – 2020-12-12 (×5): 30 mL via ORAL
  Filled 2020-11-26 (×6): qty 30

## 2020-11-26 MED ORDER — OLANZAPINE 10 MG IM SOLR: 10.0000 mg | Freq: Two times a day (BID) | INTRAMUSCULAR | Status: DC | PRN

## 2020-11-26 MED ORDER — ACETAMINOPHEN 325 MG PO TABS
650.0000 mg | ORAL_TABLET | Freq: Four times a day (QID) | ORAL | Status: DC | PRN
  Administered 2020-11-27 – 2020-12-07 (×3): 650 mg via ORAL
  Filled 2020-11-26 (×4): qty 2

## 2020-11-26 NOTE — BH Assessment (Addendum)
[facility] Assessment Progress Note   Per Sheran Fava, NP, this pt requires psychiatric hospitalization.  Linsey, RN, [facility] has pre-assigned pt to [facility] Rm [id number] to the service of Dr Caswell Corwin in anticipation of a discharge planned for later today.  Pt presents under IVC initiated by pt's daughter, and upheld by EDP Delora Fuel, MD, and IVC documents have been faxed to [facility].  EDP Lennice Sites, DO and pt's nurse, Joellen Jersey, have been notified, and Joellen Jersey agrees to call report to [phone].  Pt is to be transported via Event organiser when the time comes.   Jalene Mullet, [place] Coordinator [phone]

## 2020-11-26 NOTE — Progress Notes (Signed)
Adult Psychoeducational Group Note  Date:  11/26/2020 Time:  10:00 PM  Group Topic/Focus:  Wrap-Up Group:   The focus of this group is to help patients review their daily goal of treatment and discuss progress on daily workbooks.  Participation Level:  Did Not Attend  Participation Quality:   Did Not Attend  Affect:   Did Not Attend  Cognitive:   Did Not Attend  Insight: None  Engagement in Group:   Did Not Attend  Modes of Intervention:   Did Not Attend  Additional Comments:  Pt did not attend evening wrap up group tonight.  Candy Sledge 11/26/2020, 10:00 PM
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
## 2020-11-26 NOTE — Progress Notes (Signed)
Nursing 1:1 note D:Pt observed sitting on the side of the bed. Pt rocking and pulling at the straps of her back brace. Pt doesn't answer any questions when asked.  A: 1:1 observation continues for safety.   R: Pt remains safe

## 2020-11-26 NOTE — ED Notes (Signed)
Pt leaving at this time for the [facility] hospital. Nilda Riggs [facility].
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
## 2020-11-26 NOTE — Tx Team (Cosign Needed)
Initial Treatment Plan 11/26/2020 5:56 PM Veronica Perez DBZ:[id number]    PATIENT STRESSORS: Health problems   Medication change or noncompliance   Traumatic event     PATIENT STRENGTHS: Supportive family/friends    PATIENT IDENTIFIED PROBLEMS: paranoia  anxiety  Medication noncompliance                 DISCHARGE CRITERIA:  Ability to meet basic life and health needs Improved stabilization in mood, thinking, and/or behavior Motivation to continue treatment in a less acute level of care Need for constant or close observation no longer present  PRELIMINARY DISCHARGE PLAN: Attend aftercare/continuing care group Return to previous living arrangement  PATIENT/FAMILY INVOLVEMENT: This treatment plan has been presented to and reviewed with the patient, Veronica Perez.  The patient and family have been given the opportunity to ask questions and make suggestions.  Baron Sane, RN 11/26/2020, 5:56 PM

## 2020-11-27 DIAGNOSIS — F312 Bipolar disorder, current episode manic severe with psychotic features: Secondary | ICD-10-CM | POA: Diagnosis not present

## 2020-11-27 LAB — T3: T3, Total: 139 ng/dL (ref 71–180)

## 2020-11-27 MED ORDER — OLANZAPINE 10 MG PO TBDP: 10.0000 mg | ORAL_TABLET | Freq: Three times a day (TID) | ORAL | Status: DC | PRN

## 2020-11-27 MED ORDER — LORAZEPAM 1 MG PO TABS
1.0000 mg | ORAL_TABLET | Freq: Four times a day (QID) | ORAL | Status: DC
  Administered 2020-11-27 – 2020-11-30 (×12): 1 mg via ORAL
  Filled 2020-11-27 (×12): qty 1

## 2020-11-27 MED ORDER — LORAZEPAM 2 MG/ML IJ SOLN
1.0000 mg | Freq: Once | INTRAMUSCULAR | Status: AC
  Administered 2020-11-27: 1 mg via INTRAMUSCULAR
  Filled 2020-11-27: qty 1

## 2020-11-27 MED ORDER — LORAZEPAM 1 MG PO TABS: 1.0000 mg | ORAL_TABLET | ORAL | Status: DC | PRN

## 2020-11-27 MED ORDER — ZIPRASIDONE MESYLATE 20 MG IM SOLR: 20.0000 mg | INTRAMUSCULAR | Status: DC | PRN

## 2020-11-27 MED ORDER — RISPERIDONE 1 MG PO TBDP
1.0000 mg | ORAL_TABLET | Freq: Every day | ORAL | Status: DC
  Administered 2020-11-27: 1 mg via ORAL
  Filled 2020-11-27 (×3): qty 1

## 2020-11-27 MED ORDER — LORAZEPAM 2 MG/ML IJ SOLN: 1.0000 mg | Freq: Four times a day (QID) | INTRAMUSCULAR | Status: DC | PRN

## 2020-11-27 NOTE — BHH Counselor (Signed)
CSW attempted to complete this pt's PSA however, pt was sleeping heavily and could not be woken.CSW will attempt again at a later time.         Darletta Moll MSW, LCSW Clincal Social Worker  [facility]

## 2020-11-27 NOTE — Progress Notes (Signed)
Pt didn't attend psycho-ed group.

## 2020-11-27 NOTE — Progress Notes (Signed)
Pt awake asleep in wheelchair during safety rounds. Refused to eat lunch when offered, tray is on her bed. Per assigned MHT sitter, pt was crying asking about her daughter prior to sleeping in her wheelchair. Writer and female MHT staff approached pt to offer assistance and put her in the bed but she refused "My daughter" while crying.  Assigned provider made aware of pt's refusal to comply with current treatment regimen. Pt remains on 1:1 observation without fall event. Back brace remain in place. Assigned MHT staff in attendance at all times. Safety maintained.

## 2020-11-27 NOTE — Group Note (Signed)
Recreation Therapy Group Note   Group Topic:Leisure Education  Group Date: 11/27/2020 Start Time: 1010 End Time: 1100 Facilitators: Victorino Sparrow, LRT/CTRS Location: 500 Hall Dayroom  Goal Area(s) Addresses:  Patient will successfully identify benefits of leisure participation. Patient will successfully identify ways to access leisure activities. Patient will identify leisure activities available based on their geographical location in proximity to their home.   Group Description:  Leisure PSA. Patients picked a leisure activity from a cup. They were to make a PSA about that activity they chose.  Patients were to identify the benefits, location, equipment needed and when the activity could be done.  Patients were provided with magazines, glue sticks, safety scissors and construction paper to complete the activity.   Affect/Mood: N/A   Participation Level: Did not attend    Clinical Observations/Individualized Feedback: Pt did not attend group.    Plan: Continue to engage patient in RT group sessions 2-3x/week.   Victorino Sparrow, LRT/CTRS 11/27/2020 12:33 PM

## 2020-11-27 NOTE — Progress Notes (Signed)
Pt didn't attend orientation/goals group. 

## 2020-11-27 NOTE — Progress Notes (Signed)
Adult Psychoeducational Group Note  Date:  11/27/2020 Time:  8:17 PM  Group Topic/Focus:  Wrap-Up Group:   The focus of this group is to help patients review their daily goal of treatment and discuss progress on daily workbooks.  Participation Level:  Did Not Attend  Participation Quality:  Did Not Attend  Affect:   Did Not Attend  Cognitive:   Did Not Attend  Insight: None  Engagement in Group:   Did Not Attend   Modes of Intervention:   Did Not Attend   Additional Comments:  Pt did not attend evening wrap up group tonight.  Candy Sledge 11/27/2020, 8:17 PM

## 2020-11-27 NOTE — Progress Notes (Signed)
   11/26/20 2000  Psych Admission Type (Psych Patients Only)  Admission Status Involuntary  Psychosocial Assessment  Patient Complaints Crying spells  Eye Contact Avertive  Facial Expression Anxious;Worried  Affect Anxious  Speech Elective mutism  Interaction Minimal  Motor Activity Restless;Unsteady  Appearance/Hygiene Disheveled;In scrubs  Behavior Characteristics Unwilling to participate;Anxious;Irritable  Mood Anxious  Thought Process  Coherency Concrete thinking  Content Blaming others  Delusions None reported or observed  Perception UTA  Hallucination UTA  Judgment Poor  Confusion UTA  Danger to Self  Current suicidal ideation? Denies (would not answer question)  Danger to Others  Danger to Others None reported or observed   Pt seen in her room with sitter. Pt sitting in wheelchair shaking. "I need to leave. I have to go home and help my daughter. She needs me. I have to go home." Pt anxious and keeps repeating herself. Pt electively mute when it comes to answering assessment questions. Pt wearing her back brace. Keeps adjusting the straps. Pt is safe on the unit.
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
## 2020-11-27 NOTE — Progress Notes (Signed)
Nursing 1:1 note D:Pt observed walking in the hallway with her sitter. Pt says she is hungry. Pt given snacks. Pt gait steady. A: 1:1 observation continues for safety  R: Pt remains safe
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
## 2020-11-27 NOTE — Progress Notes (Signed)
Nursing 1:1 note D:Pt observed sitting in wheelchair. Pt refuses to get in bed and lay down to try and rest. Pt still electively mute.  A: 1:1 observation continues for safety  R: Pt remains safe

## 2020-11-27 NOTE — Progress Notes (Signed)
Recreation Therapy Notes  10.25.22  1129:  LRT attempted to complete Recreation Therapy assessment with patient.  Pt started crying and shaking/moving around in her wheelchair.  LRT will attempt to complete assessment at a later time.    Victorino Sparrow, LRT/CTRS    Ria Comment, Berdell Hostetler A 11/27/2020 1:24 PM

## 2020-11-27 NOTE — Progress Notes (Signed)
Pt presents with elective mutism, crying spells, childlike and mild confusion at medication window on initial contact. Required multiple verbal redirections to comply with current treatment regimen as in taking her medications, being out in milieu for scheduled groups but without effect; as pt declined on multiple approach. Denies SI, HI and AVH with head node while crying. Endorse having pain but will not verbalize where and how severe. Per shift report, pt slept poorly last night as she was in her wheelchair till 0200 refusing to go to bed. Documented sleep time was 2.75 last night. 1:1 observation maintained for safety related to unsteady gait. No fall incident to note thus far. Assigned MHT staff in attendance at all time. All medications offered with verbal education but pt continues to refused. Emotional support, encouragement and reassurance offered to pt.  Pt remains safe on unit. Visible in her room at this time. Refused to participate in unit groups as scheduled.

## 2020-11-27 NOTE — Progress Notes (Signed)
Nursing 1:1 note D:Pt observed sleeping in bed with eyes closed. RR even and unlabored. No distress noted. A: 1:1 observation continues for safety  R: Pt remains safe  
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
## 2020-11-27 NOTE — Progress Notes (Signed)
Pt has been receptive to care post Ativan 1 mg IM trial. Pt observed in milieu, got up from the wheelchair independently, smiling while talking on phone to family. Took her evening medications as scheduled without issues. Ate dinner, tolerated meal well.  1:1 observation maintained for safety without incident.Assigned staff in attendance at all times. Continued support, encouragement and reassurance provided to pt.  Pt remains safe on unit.

## 2020-11-28 ENCOUNTER — Encounter (HOSPITAL_COMMUNITY): Payer: Self-pay

## 2020-11-28 DIAGNOSIS — F312 Bipolar disorder, current episode manic severe with psychotic features: Secondary | ICD-10-CM | POA: Diagnosis not present

## 2020-11-28 LAB — HEMOGLOBIN A1C
Hgb A1c MFr Bld: 5 % (ref 4.8–5.6)
Mean Plasma Glucose: 96.8 mg/dL

## 2020-11-28 LAB — LIPID PANEL
Cholesterol: 141 mg/dL (ref 0–200)
HDL: 40 mg/dL — ABNORMAL LOW (ref >40)
LDL Cholesterol: 58 mg/dL (ref 0–99)
Total CHOL/HDL Ratio: 3.5 RATIO
Triglycerides: 214 mg/dL — ABNORMAL HIGH (ref <150)
VLDL: 43 mg/dL — ABNORMAL HIGH (ref 0–40)

## 2020-11-28 MED ORDER — RISPERIDONE 2 MG PO TBDP
2.0000 mg | ORAL_TABLET | Freq: Every day | ORAL | Status: DC
  Administered 2020-11-29 – 2020-11-30 (×2): 2 mg via ORAL
  Filled 2020-11-28 (×5): qty 1

## 2020-11-28 MED ORDER — BENZTROPINE MESYLATE 0.5 MG PO TABS
0.5000 mg | ORAL_TABLET | Freq: Two times a day (BID) | ORAL | Status: DC | PRN
  Administered 2020-12-09: 0.5 mg via ORAL
  Filled 2020-11-28 (×2): qty 1
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
## 2020-11-28 NOTE — Progress Notes (Signed)
Pt didn't attend therapeutic relaxation group.

## 2020-11-28 NOTE — Progress Notes (Signed)
D: Patient presents with appropriate affect at time of assessment. Patient denies SI/HI at this time. Patient also denies AH/VH at this time. Patient contracts for safety.  A: Provided positive reinforcement and encouragement. 1:1 observation in place for safety.  R: Patient cooperative and receptive to efforts. Patient remains safe on the unit under 1:1 observation.

## 2020-11-28 NOTE — Group Note (Signed)
Recreation Therapy Group Note   Group Topic:Problem Solving  Group Date: 11/28/2020 Start Time: 1000 End Time: 6812 Facilitators: Victorino Sparrow, LRT/CTRS Location: 500 Hall Dayroom   Goal Area(s) Addresses:  Patient will effectively work with peer towards shared goal.  Patient will identify skills used to make activity successful.  Patient will identify how skills used during activity can be used to reach post d/c goals.    Group Description: Each patient was given a poly spot plus one extra for the whole group and LRT used cones to create an obstacle course for patients to maneuver through.  Patients were to use the poly spots to get the group from one end of the hall to the other.  If anyone were to step off of their spot, the group had to start over. LRT and patients debriefed on the importance of patience, communication, paying attention, asking peers for help, and problem solving   Affect/Mood: N/A   Participation Level: Did not attend    Clinical Observations/Individualized Feedback: Pt did not attend group.    Plan: Continue to engage patient in RT group sessions 2-3x/week.   Victorino Sparrow, LRT/CTRS  11/28/2020 1:20 PM

## 2020-11-28 NOTE — Progress Notes (Signed)
Pt visible in dayroom for afternoon groups. Pt was actively engaged minimally. Tolerates lunch and fluids well. Remains medication compliant without adverse drug reactions. Ambulatory in milieu with a slow and steady gait. Back brace remains in place.  Support and reassurance provided to pt. 1:1 observation maintained without falls. Assigned staff in attendance at all times. Pt tolerates scheduled medications well without discomfort. Forwards on interactions. Pt is safe on unit.

## 2020-11-28 NOTE — Progress Notes (Signed)
Pt visible in milieu at still on phone this evening. Remains medication compliant, tolerates meals well. Denies concerns at this time. Support and reassurance provided to pt. 1:1 observation maintained without falls. Assigned staff in attendance at all times. Encouraged pt to verbalized concerns.  Pt tolerates scheduled medications well without discomfort. Forwards on interactions. Pt is safe on unit.
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
## 2020-11-28 NOTE — Group Note (Signed)
LCSW Group Therapy Note  Group Date: 11/28/2020 Start Time: 1300 End Time: 1400   Type of Therapy and Topic:  Group Therapy: Positive Affirmations  Participation Level:  Minimal   Description of Group:   This group addressed positive affirmation towards self and others.  Patients went around the room and identified two positive things about themselves and two positive things about a peer in the room.  Patients reflected on how it felt to share something positive with others, to identify positive things about themselves, and to hear positive things from others/ Patients were encouraged to have a daily reflection of positive characteristics or circumstances.   Therapeutic Goals: Patients will verbalize two of their positive qualities Patients will demonstrate empathy for others by stating two positive qualities about a peer in the group Patients will verbalize their feelings when voicing positive self affirmations and when voicing positive affirmations of others Patients will discuss the potential positive impact on their wellness/recovery of focusing on positive traits of self and others.  Summary of Patient Progress:  Veronica Perez engaged in the discussion towards the end of group, however did not participate majority of the time. She was able to identify positive affirmations about herself as well as other group members. Patient demonstrated limited insight into the subject matter, was respectful of peers, participated throughout the entire session.  Therapeutic Modalities:   Cognitive Behavioral Therapy Motivational Interviewing    Vassie Moselle, LCSW 11/28/2020  2:34 PM

## 2020-11-28 NOTE — BHH Group Notes (Signed)
Adult Psychoeducational Group Note  Date:  11/28/2020 Time:  9:44 AM  Group Topic/Focus:  Goals Group:   The focus of this group is to help patients establish daily goals to achieve during treatment and discuss how the patient can incorporate goal setting into their daily lives to aide in recovery.  Participation Level:  Did Not Attend    [name] 11/28/2020, 9:44 AM

## 2020-11-28 NOTE — Progress Notes (Signed)
Nursing 1:1 note D:Pt observed sleeping in bed with eyes closed. RR even and unlabored. No distress noted. A: 1:1 observation continues for safety  R: Pt remains safe  
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
## 2020-11-28 NOTE — Progress Notes (Signed)
   11/28/20 2041  Psych Admission Type (Psych Patients Only)  Admission Status Involuntary  Psychosocial Assessment  Patient Complaints Anxiety  Eye Contact Brief  Facial Expression Pensive;Flat  Affect Appropriate to circumstance  Speech Logical/coherent  Interaction Assertive  Motor Activity Slow  Appearance/Hygiene Improved  Behavior Characteristics Cooperative;Appropriate to situation  Mood Anxious  Thought Process  Coherency Concrete thinking  Content Blaming others  Delusions None reported or observed  Perception Derealization  Hallucination None reported or observed  Judgment Impaired  Confusion Mild  Danger to Self  Current suicidal ideation? Denies  Danger to Others  Danger to Others None reported or observed

## 2020-11-28 NOTE — BHH Group Notes (Signed)
Adult Psychoeducational Group Note  Date:  11/28/2020 Time:  9:02 PM  Group Topic/Focus:  Making Healthy Choices:   The focus of this group is to help patients identify negative/unhealthy choices they were using prior to admission and identify positive/healthier coping strategies to replace them upon discharge.  Participation Level:  Active  Participation Quality:  Attentive  Affect:  Appropriate  Cognitive:  Alert  Insight: Good  Engagement in Group:  Engaged  Modes of Intervention:  Discussion  Additional Comments  Dalene Carrow 11/28/2020, 9:02 PM

## 2020-11-28 NOTE — BH IP Treatment Plan (Signed)
Interdisciplinary Treatment and Diagnostic Plan Update  11/28/2020 Time of Session: 10:00am  RHILEE CURRIN MRN: [id number]  Principal Diagnosis: <principal problem not specified>  Secondary Diagnoses: Active Problems:   Schizoaffective psychosis ([place])   Current Medications:  Current Facility-Administered Medications  Medication Dose Route Frequency Provider Last Rate Last Admin   acetaminophen (TYLENOL) tablet 650 mg  650 mg Oral Q6H PRN Suella Broad, FNP   650 mg at 11/27/20 2117   alum & mag hydroxide-simeth (MAALOX/MYLANTA) 200-200-20 MG/5ML suspension 30 mL  30 mL Oral Q4H PRN Starkes-Perry, Gayland Curry, FNP       LORazepam (ATIVAN) injection 1 mg  1 mg Intramuscular Q6H PRN Harlow Asa, MD       OLANZapine zydis (ZYPREXA) disintegrating tablet 10 mg  10 mg Oral Q8H PRN France Ravens, MD       And   LORazepam (ATIVAN) tablet 1 mg  1 mg Oral PRN France Ravens, MD       And   ziprasidone (GEODON) injection 20 mg  20 mg Intramuscular PRN France Ravens, MD       LORazepam (ATIVAN) tablet 1 mg  1 mg Oral QID France Ravens, MD   1 mg at 11/28/20 1305   magnesium hydroxide (MILK OF MAGNESIA) suspension 30 mL  30 mL Oral Daily PRN Suella Broad, FNP       metoprolol tartrate (LOPRESSOR) tablet 25 mg  25 mg Oral BID Suella Broad, FNP   25 mg at 11/28/20 9678   risperiDONE (RISPERDAL M-TABS) disintegrating tablet 2 mg  2 mg Oral QHS France Ravens, MD       PTA Medications: Medications Prior to Admission  Medication Sig Dispense Refill Last Dose   propranolol (INDERAL) 10 MG tablet Take 0.5 tablets (5 mg total) by mouth 2 (two) times daily. 30 tablet 0    QUEtiapine (SEROQUEL) 25 MG tablet Take 1 tablet (25 mg total) by mouth at bedtime. (Patient taking differently: Take 25 mg by mouth 2 (two) times daily.) 30 tablet 0     Patient Stressors: Health problems   Medication change or noncompliance   Traumatic event    Patient Strengths: Supportive family/friends    Treatment Modalities: Medication Management, Group therapy, Case management,  1 to 1 session with clinician, Psychoeducation, Recreational therapy.   Physician Treatment Plan for Primary Diagnosis: <principal problem not specified> Long Term Goal(s): Improvement in symptoms so as ready for discharge   Short Term Goals: Ability to identify changes in lifestyle to reduce recurrence of condition will improve Ability to verbalize feelings will improve Ability to disclose and discuss suicidal ideas Ability to demonstrate self-control will improve Ability to identify and develop effective coping behaviors will improve Ability to maintain clinical measurements within normal limits will improve Compliance with prescribed medications will improve Ability to identify triggers associated with substance abuse/mental health issues will improve  Medication Management: Evaluate patient's response, side effects, and tolerance of medication regimen.  Therapeutic Interventions: 1 to 1 sessions, Unit Group sessions and Medication administration.  Evaluation of Outcomes: Not Met  Physician Treatment Plan for Secondary Diagnosis: Active Problems:   Schizoaffective psychosis ([place])  Long Term Goal(s): Improvement in symptoms so as ready for discharge   Short Term Goals: Ability to identify changes in lifestyle to reduce recurrence of condition will improve Ability to verbalize feelings will improve Ability to disclose and discuss suicidal ideas Ability to demonstrate self-control will improve Ability to identify and develop effective coping behaviors will improve  Ability to maintain clinical measurements within normal limits will improve Compliance with prescribed medications will improve Ability to identify triggers associated with substance abuse/mental health issues will improve     Medication Management: Evaluate patient's response, side effects, and tolerance of medication  regimen.  Therapeutic Interventions: 1 to 1 sessions, Unit Group sessions and Medication administration.  Evaluation of Outcomes: Not Met   RN Treatment Plan for Primary Diagnosis: <principal problem not specified> Long Term Goal(s): Knowledge of disease and therapeutic regimen to maintain health will improve  Short Term Goals: Ability to remain free from injury will improve, Ability to participate in decision making will improve, Ability to verbalize feelings will improve, Ability to disclose and discuss suicidal ideas, and Ability to identify and develop effective coping behaviors will improve  Medication Management: RN will administer medications as ordered by provider, will assess and evaluate patient's response and provide education to patient for prescribed medication. RN will report any adverse and/or side effects to prescribing provider.  Therapeutic Interventions: 1 on 1 counseling sessions, Psychoeducation, Medication administration, Evaluate responses to treatment, Monitor vital signs and CBGs as ordered, Perform/monitor CIWA, COWS, AIMS and Fall Risk screenings as ordered, Perform wound care treatments as ordered.  Evaluation of Outcomes: Not Met   LCSW Treatment Plan for Primary Diagnosis: <principal problem not specified> Long Term Goal(s): Safe transition to appropriate next level of care at discharge, Engage patient in therapeutic group addressing interpersonal concerns.  Short Term Goals: Engage patient in aftercare planning with referrals and resources, Increase social support, Increase emotional regulation, Facilitate acceptance of mental health diagnosis and concerns, Identify triggers associated with mental health/substance abuse issues, and Increase skills for wellness and recovery  Therapeutic Interventions: Assess for all discharge needs, 1 to 1 time with Social worker, Explore available resources and support systems, Assess for adequacy in community support network,  Educate family and significant other(s) on suicide prevention, Complete Psychosocial Assessment, Interpersonal group therapy.  Evaluation of Outcomes: Not Met   Progress in Treatment: Attending groups: Yes. Participating in groups: Yes. Taking medication as prescribed: Yes. Toleration medication: Yes. Family/Significant other contact made: Yes, individual(s) contacted:  Mother  Patient understands diagnosis: No. Discussing patient identified problems/goals with staff: Yes. Medical problems stabilized or resolved: Yes. Denies suicidal/homicidal ideation: Yes. Issues/concerns per patient self-inventory: No.   New problem(s) identified: No, Describe:  None   New Short Term/Long Term Goal(s): medication stabilization, elimination of SI thoughts, development of comprehensive mental wellness plan.   Patient Goals: "To get better with my back injury"  Discharge Plan or Barriers: Patient recently admitted. CSW will continue to follow and assess for appropriate referrals and possible discharge planning.   Reason for Continuation of Hospitalization: Anxiety Depression Medication stabilization  Estimated Length of Stay: 3 to 5 days    Scribe for Treatment Team: Darleen Crocker, Latanya Presser 11/28/2020 3:28 PM

## 2020-11-28 NOTE — Progress Notes (Signed)
Pt visible at medication window on initial contact with a bright affect, logical but concrete when engaged with fair eye contact, ambulatory in milieu with a slow but steady gait. Pt denies SI, HI, AVH and pain when assessed. Compliant with medications via PO route when offered. Reported she slept well with good appetite, low  energy and good concentration. Rates her depression 6/10 and hopelessness 8/10 and anxiety 1/10 with current stressors being "I want to go home and I miss my daughter". Pt did not attend scheduled morning groups despite multiple prompts. Observed on phone with family majority of this shift.  Support and reassurance provided to pt. 1:1 observation maintained without falls. Assigned staff in attendance at all times. Pt tolerates scheduled medications well without discomfort. Forwards on interactions. Back brace in place. Pt is safe on unit.

## 2020-11-29 DIAGNOSIS — F061 Catatonic disorder due to known physiological condition: Secondary | ICD-10-CM | POA: Diagnosis present

## 2020-11-29 DIAGNOSIS — F3164 Bipolar disorder, current episode mixed, severe, with psychotic features: Secondary | ICD-10-CM

## 2020-11-29 DIAGNOSIS — F312 Bipolar disorder, current episode manic severe with psychotic features: Secondary | ICD-10-CM | POA: Diagnosis not present

## 2020-11-29 MED ORDER — RISPERIDONE 1 MG PO TBDP
1.0000 mg | ORAL_TABLET | Freq: Every day | ORAL | Status: DC
  Administered 2020-11-29 – 2020-11-30 (×2): 1 mg via ORAL
  Filled 2020-11-29 (×3): qty 1

## 2020-11-29 NOTE — Progress Notes (Signed)
Nursing 1:1 note D:Pt observed on the telephone in the alcove. RR even and unlabored. No distress noted. Pt finished phone call and headed back to room with sitter.  A: 1:1 observation continues for safety  R: Pt remains safe

## 2020-11-29 NOTE — Progress Notes (Signed)
1:1  Pt's behavior has been less elevated this afternoon. On approach pt continues to harp on the same subjects regarding, daughter, family, and admission. Pt was educated on proper application of their brace. Pt was informed that if they weren't wearing their brace properly and limitations were being met, then perhaps reassessment of their need for the brace could reevaluated. Pt voiced understanding. Pt denies any physical complaints. Pt is walking now. Pt safe on the unit. Q51msafety checks implemented and continued. 1:1 continues for safety. Will continue to monitor. 1:1 continues for safety.

## 2020-11-29 NOTE — Progress Notes (Signed)
1:1 note  Pt found in bed; allowed to rest. After progression, pt was found in the dayroom for group. Pt told their sitter that their back was hurting so they have went back to using their wheel chair. Pt was argumentative with morning meds and attention seeking. Pt is childlike in their interaction. Pt displays tangential speech patterns and thought content. Pt ruminates on their parents placing them here and easily distractible. Pt continues to be difficult to redirect. Pt declined medication and even bargained to given their wheel chair back if they didn't have to take medications. Pt was eventually complaint with antipsychotics, but shows no evidence of learning or understanding for the need for these. Pt denies any pain to this writer this AM.  Pt denies si/hi/ah/vh and verbally agrees to approach staff if these become apparent or before harming themselves/others while at [place]: Pt provided support and encouragement. Pt given medication per protocol and standing orders. Q64m safety checks implemented and continued. 1:1 continues for safety.  R: Pt safe on the unit. Will continue to monitor.

## 2020-11-29 NOTE — Progress Notes (Signed)
D: Patient is currently asleep in bed with sitter at bedside. Respirations even and unlabored.  A: 1:1 observation in place for safety.  R: Patient remains safe on the unit at this time. 1:1 observation remains in place for safety.

## 2020-11-29 NOTE — Progress Notes (Signed)
D: Patient is asleep in bed at this time with sitter at bedside. Respirations even and unlabored.  A: 1:1 observation in place for safety.  R: Patient remains safe on the unit a this time. 1:1 observation remains in place for safety.

## 2020-11-29 NOTE — [facility] Group Notes (Signed)
Adult Psychoeducational Group Note  Date:  11/29/2020 Time:  4:24 PM  Group Topic/Focus:  Wellness Toolbox:   The focus of this group is to discuss various aspects of wellness, balancing those aspects and exploring ways to increase the ability to experience wellness.  Patients will create a wellness toolbox for use upon discharge.  Participation Level:  Did Not Attend   [name] 11/29/2020, 4:24 PM
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
## 2020-11-29 NOTE — Progress Notes (Signed)
The patient went on and on about her parents and how they watch television all of the time. She states that she hears the sound of television when she eats, sleeps, and goes to the restroom. Patient also states that she was grateful for being allowed to stay in her room today in order to relax and eat.

## 2020-11-29 NOTE — B[facility] Group Notes (Signed)
Adult Psychoeducational Group Note  Date:  11/29/2020 Time:  8:53 AM  Group Topic/Focus:  Goals Group:   The focus of this group is to help patients establish daily goals to achieve during treatment and discuss how the patient can incorporate goal setting into their daily lives to aide in recovery.  Participation Level:  Did Not Attend    [name] 11/29/2020, 8:53 AM

## 2020-11-29 NOTE — Progress Notes (Signed)
1:1 note  Pt's behavior has continued to escalate throughout the morning. Pt was adamant they didn't need medication that was prescribed for them and their elevated mood. Pt was eventually compliant. Pt was heard on the phone becoming verbally abusive toward family. Pt was asked to lower their tone and was compliant. Pt was provided lunch and was resting after. Pt failed to attend after lunch group. Pt safe on the unit. Q31m safety checks implemented and continued. 1:1 continues for safety. Will continue to monitor.

## 2020-11-29 NOTE — Progress Notes (Signed)
   11/29/20 1955  Psych Admission Type (Psych Patients Only)  Admission Status Involuntary  Psychosocial Assessment  Patient Complaints Anxiety;Depression;Irritability  Eye Contact Brief  Facial Expression Anxious;Pensive;Worried  Affect Anxious;Irritable  Materials engineer Activity Slow  Appearance/Hygiene Unremarkable  Behavior Characteristics Cooperative;Anxious;Irritable  Mood Anxious;Irritable;Pleasant  Thought Pension scheme manager thinking  Content Obsessions  Delusions Persecutory  Perception Derealization  Hallucination None reported or observed  Judgment Poor  Confusion None  Danger to Self  Current suicidal ideation? Denies  Danger to Others  Danger to Others None reported or observed   Pt seen in dayroom with sitter. Pt combing her hair. Pt denies SI, HI, AVH. Pt rates pain 5/10 in her back. Refused medication but did ask for heat and cold packs. Rates anxiety 3/10 and depression 5/10. Pt says she is depressed d/t her pain in her back. Pt says her anxiety is d/t not being home with her family at this time. Pt educated on the need for sleep, medication compliance and group attendance to comply with her treatment plan and get discharged sooner. Pt needs reinforcement to keep with treatment goals.

## 2020-11-29 NOTE — Group Note (Signed)
Occupational Therapy Group Note  Group Topic:Coping Skills  Group Date: 11/29/2020 Start Time: 1400 End Time: 1440 Facilitators: Ponciano Ort, OT/L   Group Description: Group encouraged increased engagement and participation through discussion and activity focused on "Coping Ahead." Patients were split up into teams and selected a card from a stack of positive coping strategies. Patients were instructed to act out/charade the coping skill for other peers to guess and receive points for their team. Discussion followed with a focus on identifying additional positive coping strategies and patients shared how they were going to cope ahead over the weekend while continuing hospitalization stay.  Therapeutic Goal(s): Identify positive vs negative coping strategies. Identify coping skills to be used during hospitalization vs coping skills outside of hospital/at home Increase participation in therapeutic group environment and promote engagement in treatment   Participation Level: Minimal   Participation Quality: Moderate Cues   Behavior: Guarded   Speech/Thought Process: Distracted   Affect/Mood: Flat   Insight: Poor   Judgement: Poor   Individualization: Veronica Perez was minimally in their participation of group discussion/activity. Pt joined late, and although was engaged in conversation with group leader, declined to engage in activity. When asked to share a song with the group, shared a song that group leader could not find and pt got frustrated, stated 'Forget about it then" and left group.   Modes of Intervention: Activity, Discussion, and Education  Patient Response to Interventions:  Challenging , Disengaged, and Engaged   Plan: Continue to engage patient in OT groups 2 - 3x/week.  11/29/2020  Ponciano Ort, OT/L

## 2020-11-29 NOTE — Plan of Care (Signed)
  Problem: Education: Goal: Ability to state activities that reduce stress will improve Outcome: Progressing   Problem: Coping: Goal: Ability to identify and develop effective coping behavior will improve Outcome: Progressing   Problem: Self-Concept: Goal: Ability to identify factors that promote anxiety will improve Outcome: Progressing

## 2020-11-29 NOTE — [facility] Suicide Risk Assessment (Signed)
[facility] INPATIENT:  Family/Significant Other Suicide Prevention Education  Suicide Prevention Education:  Contact Attempts:  mother Oval Moralez [phone]), has been identified by the patient as the family member/significant other with whom the patient will be residing, and identified as the person(s) who will aid the patient in the event of a mental health crisis.  With written consent from the patient, two attempts were made to provide suicide prevention education, prior to and/or following the patient's discharge.  We were unsuccessful in providing suicide prevention education.  A suicide education pamphlet was given to the patient to share with family/significant other.  Date and time of first attempt:11/29/2020 / 2:38pm CSW attempted to reach this pt's mother, was unable to leave a voicemail. Date and time of second attempt: Second attempt still needs to be made.  Jakayden Cancio A Jasminne Mealy 11/29/2020, 2:38 PM

## 2020-11-29 NOTE — Group Note (Signed)
Recreation Therapy Group Note   Group Topic:Personal Development  Group Date: 11/29/2020 Start Time: 1761 End Time: 1023 Facilitators: Victorino Sparrow, LRT/CTRS Location: [address] Dayroom   Goal Area(s) Addresses:  Patient will successfully identify triggers. Patient will successfully identify ways to avoid triggers. Patient will successfully identify benefits of knowing triggers.    Group Description:  Triggers.  Patients were given a worksheet where they were to identify three things that triggered them the most.  Patients then identified ways to avoid dealing with those triggers and lastly ways to deal with triggers head on when they can't be avoided.  LRT and patients then discussed the impact triggers can have on overall well being.   Affect/Mood: Anxious   Participation Level: Engaged   Participation Quality: Independent   Behavior: Interactive    Speech/Thought Process: Frustrated   Insight: Good   Judgement: Good   Modes of Intervention: Worksheet   Patient Response to Interventions:  Engaged   Education Outcome:  Acknowledges education and In group clarification offered    Clinical Observations/Individualized Feedback: Pt was engaged during group.  Pt showed frustration when discussing triggers.  Pt expressed trigger was parents ignoring dangers and unsafe things throughout their home.  Pt went on to explain when she tries to clean up the hazards and make the house more safe, parents always refer her to take her medication.  Pt continued to get frustrated because she felt everyone was against her trying to make the house safe to be in and she is the only one who looks after her parents, so she gets frustrated with them not listening to her.    Plan: Continue to engage patient in RT group sessions 2-3x/week.   Victorino Sparrow, LRT/CTRS 11/29/2020 11:37 AM

## 2020-11-30 DIAGNOSIS — F312 Bipolar disorder, current episode manic severe with psychotic features: Secondary | ICD-10-CM | POA: Diagnosis not present

## 2020-11-30 MED ORDER — LORAZEPAM 1 MG PO TABS
1.0000 mg | ORAL_TABLET | Freq: Three times a day (TID) | ORAL | Status: DC
  Administered 2020-11-30 – 2020-12-02 (×6): 1 mg via ORAL
  Filled 2020-11-30 (×5): qty 1

## 2020-11-30 MED ORDER — RISPERIDONE 2 MG PO TBDP
2.0000 mg | ORAL_TABLET | Freq: Every day | ORAL | Status: DC
  Administered 2020-12-01 – 2020-12-06 (×6): 2 mg via ORAL
  Filled 2020-11-30 (×9): qty 1

## 2020-11-30 NOTE — Progress Notes (Signed)
1:1 note  Pt's behavior has decreased since lunch. Pt is less fixated on their parents and leaving. Pt has been seen in the dayroom and groups. Pt denies any physical complaints or pain. Pt safe on the unit. Q71m safety checks implemented and continued. Will continue to monitor. 1:1 continues for safety.
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
## 2020-11-30 NOTE — Progress Notes (Signed)
Pt didn't attend therapeutic relaxation group.

## 2020-11-30 NOTE — Progress Notes (Signed)
1:1 note  Pt's mood has become elevated again but has produced positive outcomes. Pt received news a "friend" was coming to see them and provided self care in the form of a shower, fixing their hair, and changing into less sexually inappropriate attire. Pt denies any physical complaints or pain. Pt is still restless, pacing often. Pt is pleasant. Pt safe on the unit. Q67m safety checks implemented and continued. Will continue to monitor. 1:1 continues for safety.

## 2020-11-30 NOTE — BHH Group Notes (Signed)
Patient did not attend group.   Spirituality group facilitated by Kathrynn Humble, [place].   Group Description: Group focused on topic of hope. Patients participated in facilitated discussion around topic, connecting with one another around experiences and definitions for hope. Group members engaged with visual explorer photos, reflecting on what hope looks like for them today. Group engaged in discussion around how their definitions of hope are present today in hospital.   Modalities: Psycho-social ed, Adlerian, Narrative, MI
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
## 2020-11-30 NOTE — Progress Notes (Addendum)
Nursing 1:1 note D:Pt observed sleeping in bed with eyes closed. RR even and unlabored. No distress noted.  A: 1:1 observation continues for safety  R: Pt remains safe. Pt can have VS assessed by day shift sitter.
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
## 2020-11-30 NOTE — Group Note (Signed)
LCSW Group Therapy Note  Group Date: 11/30/2020 Start Time: 1100 End Time: 1200   Type of Therapy and Topic:  Group Therapy - Healthy vs Unhealthy Coping Skills  Participation Level:  Minimal   Description of Group The focus of this group was to determine what unhealthy coping techniques typically are used by group members and what healthy coping techniques would be helpful in coping with various problems. Patients were guided in becoming aware of the differences between healthy and unhealthy coping techniques. Patients were asked to identify 2-3 healthy coping skills they would like to learn to use more effectively.  Therapeutic Goals Patients learned that coping is what human beings do all day long to deal with various situations in their lives Patients defined and discussed healthy vs unhealthy coping techniques Patients identified their preferred coping techniques and identified whether these were healthy or unhealthy Patients determined 2-3 healthy coping skills they would like to become more familiar with and use more often. Patients provided support and ideas to each other   Summary of Patient Progress:  Patient participated minimally in group. Patient was guarded and irritable. Patient left halfway through group.    Therapeutic Modalities Cognitive Behavioral Therapy Motivational Interviewing  Zachery Conch, [place] 11/30/2020  12:25 PM

## 2020-11-30 NOTE — Plan of Care (Signed)
Attempted to call mother Perry Molla for collateral [phone]) multiple times but nobody answered. Will attempt to contact again at later time.

## 2020-11-30 NOTE — BHH Group Notes (Signed)
Adult Psychoeducational Group Note  Date:  11/30/2020 Time:  8:57 PM  Group Topic/Focus:  Goals Group:   The focus of this group is to help patients establish daily goals to achieve during treatment and discuss how the patient can incorporate goal setting into their daily lives to aide in recovery.  Participation Level:  Active  Participation Quality:  Attentive  Affect:  Appropriate  Cognitive:  Alert  Insight: Good  Engagement in Group:  Engaged  Modes of Intervention:  Discussion  Additional Comments  Dalene Carrow 11/30/2020, 8:57 PM

## 2020-11-30 NOTE — Progress Notes (Signed)
Nursing 1:1 note D:Pt observed sleeping in bed with eyes closed. RR even and unlabored. No distress noted. A: 1:1 observation continues for safety  R: Pt remains safe  

## 2020-11-30 NOTE — Plan of Care (Signed)
  Problem: Self-Concept: Goal: Level of anxiety will decrease Outcome: Progressing Goal: Ability to modify response to factors that promote anxiety will improve Outcome: Progressing   Problem: Education: Goal: Knowledge of [place] [facility] Education information/materials will improve Outcome: Progressing

## 2020-11-30 NOTE — Progress Notes (Signed)
Pt didn't attend psycho-ed group.

## 2020-11-30 NOTE — Progress Notes (Signed)
Pt didn't attend orientation/goals group. 

## 2020-11-30 NOTE — Group Note (Deleted)
LCSW Group Therapy Note   Group Date: 11/30/2020 Start Time: 1100 End Time: 1200   Type of Therapy and Topic:  Group Therapy:   Participation Level:  {BHH PARTICIPATION PZPSU:86484}  Description of Group:   Therapeutic Goals:  1.     Summary of Patient Progress:    ***  Therapeutic Modalities:   Zachery Conch, LCSWA 11/30/2020  11:58 AM

## 2020-12-01 DIAGNOSIS — F312 Bipolar disorder, current episode manic severe with psychotic features: Secondary | ICD-10-CM | POA: Diagnosis not present

## 2020-12-01 MED ORDER — RISPERIDONE 3 MG PO TBDP
3.0000 mg | ORAL_TABLET | Freq: Every day | ORAL | Status: DC
  Administered 2020-12-01 – 2020-12-05 (×5): 3 mg via ORAL
  Filled 2020-12-01 (×7): qty 1

## 2020-12-01 MED ORDER — DOXYCYCLINE HYCLATE 100 MG PO TABS
100.0000 mg | ORAL_TABLET | Freq: Two times a day (BID) | ORAL | Status: AC
  Administered 2020-12-01 – 2020-12-07 (×14): 100 mg via ORAL
  Filled 2020-12-01 (×16): qty 1

## 2020-12-01 MED ORDER — BACITRACIN-NEOMYCIN-POLYMYXIN OINTMENT TUBE
TOPICAL_OINTMENT | Freq: Two times a day (BID) | CUTANEOUS | Status: DC
  Administered 2020-12-03 – 2020-12-06 (×3): 1 via TOPICAL
  Filled 2020-12-01 (×2): qty 14.17

## 2020-12-01 MED ORDER — DOXYCYCLINE HYCLATE 100 MG PO TABS: 100.0000 mg | ORAL_TABLET | Freq: Two times a day (BID) | ORAL | Status: DC

## 2020-12-01 NOTE — Progress Notes (Signed)
1:1 note  Pt continues to improve today and is more animated this afternoon. Pt denies any physical complaints. Wound care provided for finger with ointment provided. Meal tray and snacks provided to pt. Pt safe on the unit. Q48m safety checks implemented and continued. 1:1 continues for safety.

## 2020-12-01 NOTE — Progress Notes (Signed)
1:1 note  Pt's behavior has been unremarkable since last encounter. No signs of distress noted. Pt continues to be restless and pacing. Pt safe on the unit. Q31m safety checks implemented and continued. Will continue to monitor. 1:1 continues for safety.

## 2020-12-01 NOTE — Progress Notes (Addendum)
[employer] [facility] [employer] MD Progress Note  12/01/2020 12:06 PM Veronica Perez  MRN:  [id number] Subjective:  Veronica Perez reported " I am not suicidal,"  Veronica Perez 1:1 due to high fall risk, agitation, confusion and disorganized thoughts. patient was seen and evaluated face-to-face.  She presents disheveled, tangential and pressured.  Reports poor concentration and disorganized thoughts and movements.  Rates her depression 3 out of 10 with 10 being the worst.  Patient needs constant redirection and encouragement with taking medications.   She is denying suicidal or homicidal ideations and auditory or visual hallucinations.  Veronica Perez present restless and agitated.  Reported "  I need to wash my hair."   Chart reviewed patient is prescribed Risperdal  2 mg p.o. twice daily.  Will titrate nightly dose. Risperdal 2 mg to 3 mg.  Patient hasn't attended daily group session consistently.  She reports a fair appetite.  States she is resting well throughout the night.  Documented sleep hours 6.5.  Staff to continue to monitor for safety.  Support, encouragement  and reassurance was provided.  Attending psychiatrist placed patient on Doxycycline 100 mg due to possible finger infection.   Principal Problem: Bipolar I disorder, current or most recent episode manic, with psychotic features ([place]) Diagnosis: Principal Problem:   Bipolar I disorder, current or most recent episode manic, with psychotic features ([place]) Active Problems:   PTSD (post-traumatic stress disorder)   Catatonia  Total Time spent with patient: 15 minutes  Past Psychiatric History:   Past Medical History:  Past Medical History:  Diagnosis Date   Bipolar disorder ([place])    Depression    Former smoker, stopped smoking many years ago    HSV (herpes simplex virus) infection    Hypothyroid    Low TSH level 05/30/2016   Panic attacks    Thyroid disease     Past Surgical History:  Procedure Laterality Date   ADENOIDECTOMY     OVARIAN CYST SURGERY      TONSILLECTOMY     Family History:  Family History  Problem Relation Age of Onset   Diabetes Father    Heart disease Father    Cancer Maternal Grandmother        breast cancer   Bipolar disorder Mother    Thyroid disease Neg Hx    Family Psychiatric  History:  Social History:  Social History   Substance and Sexual Activity  Alcohol Use Not Currently   Comment: denies     Social History   Substance and Sexual Activity  Drug Use No    Social History   Socioeconomic History   Marital status: Single    Spouse name: Not on file   Number of children: Not on file   Years of education: Not on file   Highest education level: High school graduate  Occupational History   Not on file  Tobacco Use   Smoking status: Every Day    Packs/day: 25.00    Types: Cigarettes   Smokeless tobacco: Never  Vaping Use   Vaping Use: Never used  Substance and Sexual Activity   Alcohol use: Not Currently    Comment: denies   Drug use: No   Sexual activity: Yes    Birth control/protection: None  Other Topics Concern   Not on file  Social History Narrative   Not on file   Social Determinants of Health   Financial Resource Strain: Not on file  Food Insecurity: Not on file  Transportation Needs: Not on file  Physical  Activity: Not on file  Stress: Not on file  Social Connections: Not on file   Additional Social History:                         Sleep: Good  Appetite:  Fair  Current Medications: Current Facility-Administered Medications  Medication Dose Route Frequency Provider Last Rate Last Admin   acetaminophen (TYLENOL) tablet 650 mg  650 mg Oral Q6H PRN Suella Broad, FNP   650 mg at 11/27/20 2117   alum & mag hydroxide-simeth (MAALOX/MYLANTA) 200-200-20 MG/5ML suspension 30 mL  30 mL Oral Q4H PRN Starkes-Perry, Gayland Curry, FNP       benztropine (COGENTIN) tablet 0.5 mg  0.5 mg Oral BID PRN Harlow Asa, MD       LORazepam (ATIVAN) injection 1 mg  1 mg  Intramuscular Q6H PRN Nelda Marseille, Aaleah Hirsch E, MD       OLANZapine zydis (ZYPREXA) disintegrating tablet 10 mg  10 mg Oral Q8H PRN France Ravens, MD       And   LORazepam (ATIVAN) tablet 1 mg  1 mg Oral PRN France Ravens, MD       And   ziprasidone (GEODON) injection 20 mg  20 mg Intramuscular PRN France Ravens, MD       LORazepam (ATIVAN) tablet 1 mg  1 mg Oral TID France Ravens, MD   1 mg at 12/01/20 0756   magnesium hydroxide (MILK OF MAGNESIA) suspension 30 mL  30 mL Oral Daily PRN Suella Broad, FNP       metoprolol tartrate (LOPRESSOR) tablet 25 mg  25 mg Oral BID Suella Broad, FNP   25 mg at 12/01/20 0756   risperiDONE (RISPERDAL M-TABS) disintegrating tablet 2 mg  2 mg Oral QHS France Ravens, MD   2 mg at 11/30/20 2032   risperiDONE (RISPERDAL M-TABS) disintegrating tablet 2 mg  2 mg Oral Daily France Ravens, MD   2 mg at 12/01/20 6546    Lab Results: No results found for this or any previous visit (from the past 78 hour(s)).  Blood Alcohol level:  Lab Results  Component Value Date   ETH <10 11/25/2020   ETH <10 50/35/4656    Metabolic Disorder Labs: Lab Results  Component Value Date   HGBA1C 5.0 11/28/2020   MPG 96.8 11/28/2020   MPG 96.8 06/28/2019   Lab Results  Component Value Date   PROLACTIN 13.1 06/28/2019   PROLACTIN 69.3 (H) 09/17/2016   Lab Results  Component Value Date   CHOL 141 11/28/2020   TRIG 214 (H) 11/28/2020   HDL 40 (L) 11/28/2020   CHOLHDL 3.5 11/28/2020   VLDL 43 (H) 11/28/2020   LDLCALC 58 11/28/2020   LDLCALC 103 (H) 06/28/2019    Physical Findings: AIMS: Facial and Oral Movements Muscles of Facial Expression: None, normal Lips and Perioral Area: None, normal Jaw: None, normal Tongue: None, normal,Extremity Movements Upper (arms, wrists, hands, fingers): None, normal Lower (legs, knees, ankles, toes): None, normal, Trunk Movements Neck, shoulders, hips: None, normal, Overall Severity Severity of abnormal movements (highest score from  questions above): None, normal Incapacitation due to abnormal movements: None, normal Patient's awareness of abnormal movements (rate only patient's report): No Awareness, Dental Status Current problems with teeth and/or dentures?: No Does patient usually wear dentures?: No  CIWA:  CIWA-Ar Total: 9 COWS:     Musculoskeletal: Strength & Muscle Tone: within normal limits Gait & Station: normal Patient leans: N/A  Psychiatric Specialty Exam:  Presentation  General Appearance: Disheveled  Eye Contact:Good  Speech:Pressured  Speech Volume:Normal  Handedness:Right   Mood and Affect  Mood:Depressed; Hopeless  Affect:Labile   Thought Process  Thought Processes:Goal Directed  Descriptions of Associations:Tangential  Orientation:Full (Time, Place and Person)  Thought Content:Delusions of toxic fumes  History of Schizophrenia/Schizoaffective disorder:No  Duration of Psychotic Symptoms:Greater than six months  Hallucinations:Denied Ideas of Reference:None  Suicidal Thoughts:Denied Homicidal Thoughts:Denied  Sensorium  Memory:Immediate Good; Recent Good; Remote Good  Judgment: Limited  Insight:Poor    Executive Functions  Concentration:Poor (unable to assess, patient not responding to questions)  Attention Span:Poor  [place]   Psychomotor Activity  Psychomotor Activity:No data recorded  Assets  Assets:Communication Skills; Desire for Improvement; Housing; Social Support   Sleep  Sleep: 5.75 hours   Physical Exam Vitals and nursing note reviewed.  Cardiovascular:     Rate and Rhythm: Normal rate.  Pulmonary:     Effort: Pulmonary effort is normal.  Neurological:     Mental Status: She is alert.  Psychiatric:        Attention and Perception: Attention normal.        Mood and Affect: Mood is anxious and depressed.        Speech: Speech normal.        Behavior: Behavior is agitated.         Thought Content: Thought content is paranoid.        Cognition and Memory: Cognition normal.   Review of Systems  Cardiovascular: Negative.   Gastrointestinal: Negative.   Psychiatric/Behavioral:  Positive for depression and hallucinations. Negative for suicidal ideas. The patient is nervous/anxious.   All other systems reviewed and are negative. Blood pressure 115/79, pulse 86, temperature 98.5 F (36.9 C), resp. rate 16, height 5\' 3"  (1.6 m), weight 76.2 kg, SpO2 99 %. Body mass index is 29.76 kg/m.   Treatment Plan Summary: Daily contact with patient to assess and evaluate symptoms and progress in treatment and Medication management  Continue with current treatment plan on 12/01/2020 as listed below except where noted  Bipolar 1 disorder with manic psychotic features:  Continue Risperdal 2 mg p.o. daily Increased Risperdal 2 mg p.o. nightly to 3 mg p.o. nightly Continue Ativan 1 mg p.o. 3 times daily - gradually titrating off monitoring for return of catatonia Attempting to get repeat EKG for QTC monitoring with dose titration  Finger Infection/r/o paronychia  Doxycycline 100mg  bid for 7 days; finger soaks and neosporin topically bid  See chart for agitation protocol  CSW to continue working on discharge disposition recommendation Patient to remain on a one-to-one Patient encouraged to participate within the therapeutic milieu   Derrill Center, NP 12/01/2020, 12:06 PM

## 2020-12-01 NOTE — [facility] Group Notes (Signed)
Psychoeducational Group Note  Date: 11/29/2020 Time: 0900-1000    Goal Setting   Purpose of Group: This group helps to provide patients with the steps of setting a goal that is specific, measurable, attainable, realistic and time specific. A discussion on how we keep ourselves stuck with negative self talk.    Participation Level:  Active  Participation Quality:  inAppropriate  Affect:  Appropriate  Cognitive:  Appropriate  Insight:  lacking  Engagement in Group:  Engaged, while there, then left the group.  Additional Comments:  Did not rate her energy. States that she is here because of her parents and they are still the same.  Paulino Rily

## 2020-12-01 NOTE — Progress Notes (Signed)
1:1 note  Pt found in bed; compliant with medication administration. Pt came to window with brace in hand and tried to give it to this Probation officer. Pt was educated to discuss using this with their MD. Pt continues to be restless. Pt has been heard on the phone being aggressive with parents but is less labile than yesterday. Pt continues to pace and has a difficult time attending group in it's entirety. Pt denies si/hi/ah/vh and verbally agrees to approach staff if these become apparent or before harming themselves/others while at [place].  A: Pt provided support and encouragement. Pt given medication per protocol and standing orders. Q90m safety checks implemented and continued.  R: Pt safe on the unit. Will continue to monitor.

## 2020-12-01 NOTE — Plan of Care (Signed)
°  Problem: Education: °Goal: Emotional status will improve °Outcome: Progressing °Goal: Mental status will improve °Outcome: Progressing °Goal: Verbalization of understanding the information provided will improve °Outcome: Progressing °  °

## 2020-12-01 NOTE — Group Note (Signed)
LCSW Group Therapy Note  Group Date: 12/01/2020 Start Time: 1130 End Time: 1200   Type of Therapy and Topic:  Group Therapy: Anger Cues and Responses  Participation Level:  Did Not Attend   Description of Group:   In this group, patients learned how to recognize the physical, cognitive, emotional, and behavioral responses they have to anger-provoking situations.  They identified a recent time they became angry and how they reacted.  They analyzed how their reaction was possibly beneficial and how it was possibly unhelpful.  The group discussed a variety of healthier coping skills that could help with such a situation in the future.  Focus was placed on how helpful it is to recognize the underlying emotions to our anger, because working on those can lead to a more permanent solution as well as our ability to focus on the important rather than the urgent.  Therapeutic Goals: Patients will remember their last incident of anger and how they felt emotionally and physically, what their thoughts were at the time, and how they behaved. Patients will identify how their behavior at that time worked for them, as well as how it worked against them. Patients will explore possible new behaviors to use in future anger situations. Patients will learn that anger itself is normal and cannot be eliminated, and that healthier reactions can assist with resolving conflict rather than worsening situations.  Summary of Patient Progress:  Veronica Perez was invited to group, did not attend.  Therapeutic Modalities:   Cognitive Behavioral Therapy    Veronica Perez, LCSWA 12/01/2020  1:21 PM

## 2020-12-01 NOTE — Group Note (Signed)
Group Topic: Other  Group Date: 12/01/2020 Start Time: 1000 End Time: 1100 Facilitators: Bethene Hankinson, Tyrell Antonio, NT  Department: [place] [facility]  Number of Participants: 8  Group Focus: activities of daily living skills, daily focus, and nursing group  Name: Veronica Perez Date of Birth: 03-19-1975  MR: [id number]    Level of Participation:Pt did attend nursing psycho ed group.

## 2020-12-01 NOTE — Progress Notes (Signed)
Cooperative with treatment, pleasant on approach, she remains on 1:1  with sitter for safety & falls, visible in the milieu, medication compliant, no behavioral issues to report on shift at this time.

## 2020-12-01 NOTE — Group Note (Signed)
Group Topic: Goal Setting  Group Date: 12/01/2020 Start Time: 0900 End Time: 0930 Facilitators: Tera Pellicane, Tyrell Antonio, NT  Department: [place] [facility]  Number of Participants: 8  Interventions utilized were orientation  Name: Veronica Perez Date of Birth: [birth year]  MR: [id number]    Level of Participation: Pt did attend morning goals group.

## 2020-12-02 DIAGNOSIS — F312 Bipolar disorder, current episode manic severe with psychotic features: Secondary | ICD-10-CM | POA: Diagnosis not present

## 2020-12-02 MED ORDER — LORAZEPAM 1 MG PO TABS
1.0000 mg | ORAL_TABLET | Freq: Two times a day (BID) | ORAL | Status: DC
  Administered 2020-12-03 – 2020-12-04 (×4): 1 mg via ORAL
  Filled 2020-12-02 (×5): qty 1

## 2020-12-02 MED ORDER — DOCUSATE SODIUM 100 MG PO CAPS
100.0000 mg | ORAL_CAPSULE | Freq: Every day | ORAL | Status: DC
  Administered 2020-12-02 – 2020-12-15 (×13): 100 mg via ORAL
  Filled 2020-12-02 (×17): qty 1

## 2020-12-02 NOTE — Group Note (Signed)
LCSW Group Therapy Note   Group Date: 12/02/2020 Start Time: 1000 End Time: 1100   Type of Therapy and Topic:  Group Therapy: Boundaries  Participation Level:  Did Not Attend  Description of Group: This group will address the use of boundaries in their personal lives. Patients will explore why boundaries are important, the difference between healthy and unhealthy boundaries, and negative and postive outcomes of different boundaries and will look at how boundaries can be crossed.  Patients will be encouraged to identify current boundaries in their own lives and identify what kind of boundary is being set. Facilitators will guide patients in utilizing problem-solving interventions to address and correct types boundaries being used and to address when no boundary is being used. Understanding and applying boundaries will be explored and addressed for obtaining and maintaining a balanced life. Patients will be encouraged to explore ways to assertively make their boundaries and needs known to significant others in their lives, using other group members and facilitator for role play, support, and feedback.  Therapeutic Goals:  1.  Patient will identify areas in their life where setting clear boundaries could be  used to improve their life.  2.  Patient will identify signs/triggers that a boundary is not being respected. 3.  Patient will identify two ways to set boundaries in order to achieve balance in  their lives: 4.  Patient will demonstrate ability to communicate their needs and set boundaries  through discussion and/or role plays  Summary of Patient Progress:  The patient was invited to group, did not attend.   Therapeutic Modalities:   Cognitive Behavioral Therapy Solution-Focused Therapy  Maretta Los, LCSWA 12/02/2020  3:10 PM

## 2020-12-02 NOTE — Progress Notes (Signed)
Nursing 1:1 note  D: Pt observed interacting appropriately in milieu. No distress reported nor observed. A: 1:1 observation continues for safety  R: pt remains safe on 1:1 for safety for TLSO brace per MD approval.
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
## 2020-12-02 NOTE — Progress Notes (Signed)
Pt didn't attend psycho-ed group.
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
## 2020-12-02 NOTE — Group Note (Signed)
Group Topic: Goal Setting  Group Date: 12/02/2020 Start Time: 0930 End Time: 1000 Facilitators: Thereasa Distance  Department: [facility]  Number of Participants: 5  Group Focus: goals/reality orientation Treatment Modality:  Patient-Centered Therapy Interventions utilized were clarification, orientation, and support Purpose: The purpose of this group is to talk about daily goals, what will be done to obtain those goals, rules of the unit and staff introduction.  Name: Veronica Perez Date of Birth: 03-22-1975  MR: [id number]    Level of Participation: when cued Quality of Participation: attentive and defensive Interactions with others: gave feedback Mood/Affect: anxious and irritable   Patients Problems:  Patient Active Problem List   Diagnosis Date Noted   Catatonia 11/29/2020   Seizure disorder ([place]) 11/06/2020   Abnormal uterine bleeding (AUB) 12/09/2018   Breast cancer screening 12/09/2018   Hyperthyroidism 11/24/2016   Bipolar disorder, current episode manic severe with psychotic features ([place]) 09/16/2016   PTSD (post-traumatic stress disorder) 09/16/2016   Panic disorder 09/16/2016   Heart palpitations 05/30/2016   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 12/22/2012   PANIC ATTACKS 04/02/2006   TOBACCO DEPENDENCE 04/02/2006   Atopic rhinitis 04/02/2006   Gastroesophageal reflux disease 04/02/2006   Bipolar I disorder, current or most recent episode manic, with psychotic features ([place]) 04/02/2006

## 2020-12-02 NOTE — Plan of Care (Signed)
I spoke with Dr. Mable Fill on call for Ortho and discussed her TLSO brace. He states the TLSO is used mainly for comfort given for compression fractures and is not required for stabilization. Will continue to allow patient to wear it since she states it is helping provide support and comfort.   Viann Fish, MD, [facility]
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
## 2020-12-02 NOTE — Progress Notes (Signed)
1 :1  Note   Patient has been sleeping, No behavior issues. 1 : 1 observation ongoing. Will continue to monitor.

## 2020-12-02 NOTE — Progress Notes (Signed)
Nursing 1:1 note D:Pt observed resting in bed with eyes closed. RR even and unlabored. No distress noted. A: 1:1 observation continues for safety  R: pt remains safe
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
## 2020-12-02 NOTE — Progress Notes (Signed)
D: Pt continues to be on 1:1 precautions for safety. MD approved pt to wear TLSO brace for support.  Pt complied with scheduled medications. Pt currently denies SI/ HI/AVH and pain to this writer during the shift. Pt interacts appropriately with others in the milieu. Pt verbally contracted for safety.   A: Support and encouragement was offered and accepted. Pt medications were administered per order. Safety rounds continued and maintained Q 15 throughout the shift.    R: Safety maintained. Will continue to monitor and assess.      12/02/20 1600  Psych Admission Type (Psych Patients Only)  Admission Status Involuntary  Psychosocial Assessment  Patient Complaints Anxiety  Eye Contact Fair  Facial Expression Animated;Anxious;Pensive;Worried  Affect Anxious;Preoccupied  Catering manager Activity Slow;Unsteady  Appearance/Hygiene Unremarkable  Behavior Characteristics Cooperative;Appropriate to situation  Thought Pension scheme manager thinking  Content Blaming others;Obsessions  Delusions Controlled;Persecutory  Perception Derealization  Hallucination None reported or observed  Judgment Poor  Confusion Mild  Danger to Self  Current suicidal ideation? Denies  Danger to Others  Danger to Others None reported or observed

## 2020-12-02 NOTE — Progress Notes (Signed)
Patient compliant with medications. 1:1 Observation ongoing. Denies SI/HI/A/VH and verbally contracted for safety. Support and encouragement provided.

## 2020-12-02 NOTE — Progress Notes (Signed)
Adult Psychoeducational Group Note  Date:  12/02/2020 Time:  12:57 AM  Group Topic/Focus:  Wrap-Up Group:   The focus of this group is to help patients review their daily goal of treatment and discuss progress on daily workbooks.     Additional Comments:  Did not attend.  Wolfgang Phoenix 12/02/2020, 12:57 AM

## 2020-12-02 NOTE — Progress Notes (Signed)
Nursing 1:1 note  D:Pt observed resting in bed with eyes closed. RR observed to be even and unlabored. No distress noted.  A: 1:1 observation continues for safety  R: pt remains safe

## 2020-12-03 ENCOUNTER — Encounter (HOSPITAL_COMMUNITY): Payer: Self-pay

## 2020-12-03 DIAGNOSIS — F312 Bipolar disorder, current episode manic severe with psychotic features: Secondary | ICD-10-CM | POA: Diagnosis not present

## 2020-12-03 MED ORDER — PROPRANOLOL HCL 10 MG PO TABS
10.0000 mg | ORAL_TABLET | Freq: Two times a day (BID) | ORAL | Status: DC
  Filled 2020-12-03 (×2): qty 1

## 2020-12-03 MED ORDER — PROPRANOLOL HCL 10 MG PO TABS
10.0000 mg | ORAL_TABLET | Freq: Three times a day (TID) | ORAL | Status: DC
  Administered 2020-12-03 – 2020-12-15 (×32): 10 mg via ORAL
  Filled 2020-12-03 (×43): qty 1

## 2020-12-03 MED ORDER — GABAPENTIN 100 MG PO CAPS
100.0000 mg | ORAL_CAPSULE | Freq: Three times a day (TID) | ORAL | Status: DC
  Filled 2020-12-03 (×5): qty 1

## 2020-12-03 NOTE — [facility] Counselor (Signed)
Events affecting the discharge plan:  - Awaiting MD's approval  Interventions by CSW: -CSW completed safety planning with patients mother. Pt's mother has   no safety concerns at this time and plans to stay with pt at home until more stable.  - Patient is established with [facility] for therapy and medication management.    - Pt has been referred to [facility] for higher level of services.   Emotional response of the patient/family to the plan of care: - Pt is agreeable to her discharge plans.     Darletta Moll MSW, LCSW Clincal Social Worker  [facility]

## 2020-12-03 NOTE — Progress Notes (Signed)
Pt didn't attend therapeutic relaxation group.

## 2020-12-03 NOTE — Progress Notes (Signed)
Pt didn't attend psycho-ed group.

## 2020-12-03 NOTE — Progress Notes (Signed)
1:1 NOTE  Patient compliant with medications and unit rules. Fall protocol in place and compliant. Patient continued to be observed on 1:1 without any issues. Support and encouragement ongoing.

## 2020-12-03 NOTE — BH IP Treatment Plan (Signed)
Interdisciplinary Treatment and Diagnostic Plan Update  12/03/2020 Time of Session: 10:50am Veronica Perez MRN: [id number]  Principal Diagnosis: Bipolar I disorder, current or most recent episode manic, with psychotic features ([place])  Secondary Diagnoses: Principal Problem:   Bipolar I disorder, current or most recent episode manic, with psychotic features ([place]) Active Problems:   PTSD (post-traumatic stress disorder)   Catatonia   Current Medications:  Current Facility-Administered Medications  Medication Dose Route Frequency Provider Last Rate Last Admin   acetaminophen (TYLENOL) tablet 650 mg  650 mg Oral Q6H PRN Suella Broad, FNP   650 mg at 12/02/20 2054   alum & mag hydroxide-simeth (MAALOX/MYLANTA) 200-200-20 MG/5ML suspension 30 mL  30 mL Oral Q4H PRN Starkes-Perry, Gayland Curry, FNP       benztropine (COGENTIN) tablet 0.5 mg  0.5 mg Oral BID PRN Harlow Asa, MD       docusate sodium (COLACE) capsule 100 mg  100 mg Oral Daily France Ravens, MD   100 mg at 12/03/20 1020   doxycycline (VIBRA-TABS) tablet 100 mg  100 mg Oral Q12H Nelda Marseille, Amy E, MD   100 mg at 12/03/20 1019   LORazepam (ATIVAN) injection 1 mg  1 mg Intramuscular Q6H PRN Harlow Asa, MD       OLANZapine zydis (ZYPREXA) disintegrating tablet 10 mg  10 mg Oral Q8H PRN France Ravens, MD       And   LORazepam (ATIVAN) tablet 1 mg  1 mg Oral PRN France Ravens, MD       And   ziprasidone (GEODON) injection 20 mg  20 mg Intramuscular PRN France Ravens, MD       LORazepam (ATIVAN) tablet 1 mg  1 mg Oral BID France Ravens, MD   1 mg at 12/03/20 1019   magnesium hydroxide (MILK OF MAGNESIA) suspension 30 mL  30 mL Oral Daily PRN Suella Broad, FNP   30 mL at 12/02/20 1513   metoprolol tartrate (LOPRESSOR) tablet 25 mg  25 mg Oral BID Suella Broad, FNP   25 mg at 12/03/20 1020   neomycin-bacitracin-polymyxin (NEOSPORIN) ointment   Topical BID Harlow Asa, MD   Given at 12/03/20 1022   risperiDONE  (RISPERDAL M-TABS) disintegrating tablet 2 mg  2 mg Oral Daily France Ravens, MD   2 mg at 12/03/20 1020   risperiDONE (RISPERDAL M-TABS) disintegrating tablet 3 mg  3 mg Oral QHS Derrill Center, NP   3 mg at 12/02/20 2052   PTA Medications: Medications Prior to Admission  Medication Sig Dispense Refill Last Dose   propranolol (INDERAL) 10 MG tablet Take 0.5 tablets (5 mg total) by mouth 2 (two) times daily. 30 tablet 0    QUEtiapine (SEROQUEL) 25 MG tablet Take 1 tablet (25 mg total) by mouth at bedtime. (Patient taking differently: Take 25 mg by mouth 2 (two) times daily.) 30 tablet 0     Patient Stressors: Health problems   Medication change or noncompliance   Traumatic event    Patient Strengths: Supportive family/friends   Treatment Modalities: Medication Management, Group therapy, Case management,  1 to 1 session with clinician, Psychoeducation, Recreational therapy.   Physician Treatment Plan for Primary Diagnosis: Bipolar I disorder, current or most recent episode manic, with psychotic features ([place]) Long Term Goal(s): Improvement in symptoms so as ready for discharge   Short Term Goals: Ability to identify changes in lifestyle to reduce recurrence of condition will improve Ability to verbalize feelings will improve Ability  to disclose and discuss suicidal ideas Ability to demonstrate self-control will improve Ability to identify and develop effective coping behaviors will improve Ability to maintain clinical measurements within normal limits will improve Compliance with prescribed medications will improve Ability to identify triggers associated with substance abuse/mental health issues will improve  Medication Management: Evaluate patient's response, side effects, and tolerance of medication regimen.  Therapeutic Interventions: 1 to 1 sessions, Unit Group sessions and Medication administration.  Evaluation of Outcomes: Not Met  Physician Treatment Plan for Secondary  Diagnosis: Principal Problem:   Bipolar I disorder, current or most recent episode manic, with psychotic features ([place]) Active Problems:   PTSD (post-traumatic stress disorder)   Catatonia  Long Term Goal(s): Improvement in symptoms so as ready for discharge   Short Term Goals: Ability to identify changes in lifestyle to reduce recurrence of condition will improve Ability to verbalize feelings will improve Ability to disclose and discuss suicidal ideas Ability to demonstrate self-control will improve Ability to identify and develop effective coping behaviors will improve Ability to maintain clinical measurements within normal limits will improve Compliance with prescribed medications will improve Ability to identify triggers associated with substance abuse/mental health issues will improve     Medication Management: Evaluate patient's response, side effects, and tolerance of medication regimen.  Therapeutic Interventions: 1 to 1 sessions, Unit Group sessions and Medication administration.  Evaluation of Outcomes: Not Met   RN Treatment Plan for Primary Diagnosis: Bipolar I disorder, current or most recent episode manic, with psychotic features ([place]) Long Term Goal(s): Knowledge of disease and therapeutic regimen to maintain health will improve  Short Term Goals: Ability to remain free from injury will improve, Ability to verbalize frustration and anger appropriately will improve, Ability to demonstrate self-control, Ability to identify and develop effective coping behaviors will improve, and Compliance with prescribed medications will improve  Medication Management: RN will administer medications as ordered by provider, will assess and evaluate patient's response and provide education to patient for prescribed medication. RN will report any adverse and/or side effects to prescribing provider.  Therapeutic Interventions: 1 on 1 counseling sessions, Psychoeducation, Medication  administration, Evaluate responses to treatment, Monitor vital signs and CBGs as ordered, Perform/monitor CIWA, COWS, AIMS and Fall Risk screenings as ordered, Perform wound care treatments as ordered.  Evaluation of Outcomes: Not Met   LCSW Treatment Plan for Primary Diagnosis: Bipolar I disorder, current or most recent episode manic, with psychotic features ([place]) Long Term Goal(s): Safe transition to appropriate next level of care at discharge, Engage patient in therapeutic group addressing interpersonal concerns.  Short Term Goals: Engage patient in aftercare planning with referrals and resources, Increase social support, Increase ability to appropriately verbalize feelings, Increase emotional regulation, Identify triggers associated with mental health/substance abuse issues, and Increase skills for wellness and recovery  Therapeutic Interventions: Assess for all discharge needs, 1 to 1 time with Social worker, Explore available resources and support systems, Assess for adequacy in community support network, Educate family and significant other(s) on suicide prevention, Complete Psychosocial Assessment, Interpersonal group therapy.  Evaluation of Outcomes: Not Met   Progress in Treatment: Attending groups: Yes. Participating in groups: Yes. Taking medication as prescribed: Yes. Toleration medication: Yes. Family/Significant other contact made: No, will contact:  mother Patient understands diagnosis: No. Discussing patient identified problems/goals with staff: Yes. Medical problems stabilized or resolved: No. Denies suicidal/homicidal ideation: Yes. Issues/concerns per patient self-inventory: No.  New problem(s) identified: No, Describe:  none  New Short Term/Long Term Goal(s): medication stabilization, elimination  of SI thoughts, development of comprehensive mental wellness plan.    Patient Goals:  "To get better with my back injury"  Discharge Plan or Barriers: Pt has been  referred to [facility] is to return home at discharge.   Reason for Continuation of Hospitalization: Anxiety Delusions  Hallucinations Medication stabilization  Estimated Length of Stay: 3-5 days   Scribe for Treatment Team: Vassie Moselle, LCSW 12/03/2020 11:51 AM

## 2020-12-03 NOTE — Progress Notes (Signed)
Pt was encouraged to come to group but didn't attend.

## 2020-12-03 NOTE — Progress Notes (Signed)
1:1 Note: Patient maintained on constant supervision for safety.  Patient visible in milieu watching TV.  Medications given as prescribed.  Patient is safe on the unit.

## 2020-12-03 NOTE — Progress Notes (Signed)
1:1 NOTE  Patient 1:1 observation ongoing. No s/s of distress Patient remains safe. In bed sleeping respirations noted. Will continue to monitor.

## 2020-12-03 NOTE — B[facility] Group Notes (Signed)
[facility] Group Notes:  (Nursing/MHT/Case Management/Adjunct)  Date:  12/03/2020  Time:  10:24 PM  Type of Therapy:   Wrap up group  Participation Level:  Active  Participation Quality:  Attentive  Affect:  Appropriate  Cognitive:  Alert  Insight:  Limited  Engagement in Group:  Supportive  Modes of Intervention:  Discussion and Education  Summary of Progress/Problems:  Malijah reported her day was good because she talked to her mother, watched the rain and talked to her boyfriend.  She stated that she felt hyper today but not as bad as yesterday.  Her goal for tomorrow is "to be discharged."  Windell Moment 12/03/2020, 10:24 PM

## 2020-12-03 NOTE — Progress Notes (Signed)
Pt in bed sleeping respirations noted. Without s/s of distress without fall this shift. 1::1 Observation ongoing.
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
## 2020-12-03 NOTE — Progress Notes (Signed)
1:1 Note: Patient is calm and appropriate on the unit.  Medication given as prescribed.  Routine safety checks maintained.  Ambulating on the unit as desired.  Patient is safe on the unit.

## 2020-12-03 NOTE — Progress Notes (Signed)
1: 1 Note: Patient maintained on constant supervision for safety.  Patient in her room resting.  Did not attend group when invited.  Visible in milieu briefly with minimal interaction.  Routine safety checks maintained.  Patient is safe on the unit.

## 2020-12-03 NOTE — Group Note (Signed)
LCSW Group Therapy Note   Group Date: 12/03/2020 Start Time: 1300 End Time: 1400   Type of Therapy and Topic:  Group Therapy: Challenging Core Beliefs  Participation Level:  Did Not Attend  Description of Group:  Patients were educated about core beliefs and asked to identify one harmful core belief that they have. Patients were asked to explore from where those beliefs originate. Patients were asked to discuss how those beliefs make them feel and the resulting behaviors of those beliefs. They were then be asked if those beliefs are true and, if so, what evidence they have to support them. Lastly, group members were challenged to replace those negative core beliefs with helpful beliefs.   Therapeutic Goals:   1. Patient will identify harmful core beliefs and explore the origins of such beliefs. 2. Patient will identify feelings and behaviors that result from those core beliefs. 3. Patient will discuss whether such beliefs are true. 4.  Patient will replace harmful core beliefs with helpful ones.  Summary of Patient Progress:  Did not attend  Therapeutic Modalities: Cognitive Behavioral Therapy; Solution-Focused Therapy   Vassie Moselle, LCSW 12/03/2020  1:50 PM
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
## 2020-12-04 MED ORDER — LORAZEPAM 0.5 MG PO TABS
0.7500 mg | ORAL_TABLET | Freq: Two times a day (BID) | ORAL | Status: DC
  Administered 2020-12-05: 0.75 mg via ORAL
  Filled 2020-12-04: qty 2

## 2020-12-04 MED ORDER — TOPIRAMATE 25 MG PO TABS
50.0000 mg | ORAL_TABLET | Freq: Two times a day (BID) | ORAL | Status: DC
  Administered 2020-12-04 – 2020-12-06 (×4): 50 mg via ORAL
  Filled 2020-12-04 (×8): qty 2

## 2020-12-04 NOTE — Progress Notes (Signed)
1:1 Note: Patient visible in milieu interacting with peers and staff.  Medications given as prescribed.  Safety checks maintained.  Support and encouragement offered as needed.  Patient is safe on the unit.

## 2020-12-04 NOTE — Progress Notes (Signed)
1:1 NOTE  Patient sleeping no distress 1:1 Observation ongoing, without S/S of distress or self harm gestures.

## 2020-12-04 NOTE — BHH Group Notes (Signed)
Adult Psychoeducational Group Note  Date:  12/04/2020 Time:  8:27 PM  Group Topic/Focus:  Making Healthy Choices:   The focus of this group is to help patients identify negative/unhealthy choices they were using prior to admission and identify positive/healthier coping strategies to replace them upon discharge.  Participation Level:  Active  Participation Quality:  Attentive  Affect:  Appropriate  Cognitive:  Appropriate  Insight: Good  Engagement in Group:  Engaged  Modes of Intervention:  Discussion  Additional Comments Dalene Carrow 12/04/2020, 8:27 PM

## 2020-12-04 NOTE — BHH Group Notes (Signed)
[place] Group Notes:  (Nursing/MHT/Case Management/Adjunct)  Date:  12/04/2020  Time:  5:10 PM  Type of Therapy:  Psychoeducational Skills  Participation Level:  Active  Participation Quality:  Appropriate  Affect:  Appropriate  Cognitive:  Appropriate  Insight:  Appropriate  Engagement in Group:  Engaged  Modes of Intervention:  Discussion and Education  Summary of Progress/Problems: The topic was mindfulness and the benefits it has such as improves sleep, helps relieve stress and overall relaxes the body and mind.   Adeoluwa Silvers J Javed Cotto 12/04/2020, 5:10 PM

## 2020-12-04 NOTE — Progress Notes (Signed)
  1:1 Note  Patient sleeping 1:1 observation continued. Patient remains safe. No S/S of distress.

## 2020-12-04 NOTE — Progress Notes (Signed)
Nursing 1:1 note D:Pt observed sitting in the dayroom with sitter. RR even and unlabored A: 1:1 observation continues for safety  R: Pt remains safe

## 2020-12-04 NOTE — Progress Notes (Signed)
1:1 NOTE  Patient in bed sleeping respirations noted. No s/s of distress. 1:1 observation ongoing without self harm gestures.

## 2020-12-04 NOTE — Group Note (Signed)
Recreation Therapy Group Note   Group Topic:Self-Esteem  Group Date: 12/04/2020 Start Time: 1000 End Time: 1035 Facilitators: Victorino Sparrow, LRT/CTRS Location: [address] Dayroom  Goal Area(s) Addresses:  Patient will be able to identify what influences self esteem. Patient will be able to identify positive traits about themselves. Patient will identify importance of having positive self esteem.    Group Description: Patients and LRT discussed what influences self esteem.  Patients and LRT talked about how internal and external factors can influence how one feels about themselves.  Patients were then given a picture of a blank face, and told to illustrate and describe how they see themselves. Patients were given colored pencils, markers, and crayons to complete the assignment. Patients shared their completed assignment with each other.  Patients were also asked to identify how others see them.  Patients and LRT debriefed on the importance of having confidence in ones self and not letting the negative outshine the positive.   Affect/Mood: N/A   Participation Level: Did not attend    Clinical Observations/Individualized Feedback:  Pt did not attend group.    Plan: Continue to engage patient in RT group sessions 2-3x/week.   Victorino Sparrow, LRT/CTRS 12/04/2020 12:45 PM
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
## 2020-12-04 NOTE — Progress Notes (Signed)
1 : 1 NOTE  12/03/20 1200  Psych Admission Type (Psych Patients Only)  Admission Status Involuntary  Psychosocial Assessment  Patient Complaints Anxiety  Eye Contact Fair  Facial Expression Animated;Anxious;Worried  Affect Anxious  Catering manager Activity Slow;Unsteady  Appearance/Hygiene Unremarkable  Behavior Characteristics Cooperative  Mood Anxious  Thought Process  Coherency Concrete thinking  Content Blaming others;Obsessions  Delusions Controlled;Persecutory  Perception Derealization  Hallucination None reported or observed  Judgment Poor  Confusion Mild  Danger to Self  Current suicidal ideation? Denies  Danger to Others  Danger to Others None reported or observed   Patient compliant with medications 1 : 1 observations ongoing. Patient remains safe.

## 2020-12-04 NOTE — Progress Notes (Signed)
1:1 Note: Patient maintained on constant supervision for safety.  Medications given as prescribed but refused Neurontin.  Ambulating on the unit as desired with steady gait.  Patient is safe on the unit.
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
## 2020-12-04 NOTE — Progress Notes (Signed)
1:1 Note: Patient maintained on constant supervision for safety.  Patient visible in milieu for therapy.  Medication given as prescribed.  Patient is calm and appropriate on the unit.  Patient is safe on the unit.

## 2020-12-04 NOTE — Progress Notes (Signed)
Pt didn't attend orientation/goals group. 

## 2020-12-05 MED ORDER — LORAZEPAM 0.5 MG PO TABS
0.7500 mg | ORAL_TABLET | Freq: Two times a day (BID) | ORAL | Status: AC
  Administered 2020-12-05: 0.75 mg via ORAL
  Filled 2020-12-05: qty 2

## 2020-12-05 MED ORDER — LORAZEPAM 0.5 MG PO TABS
0.5000 mg | ORAL_TABLET | Freq: Two times a day (BID) | ORAL | Status: AC
  Administered 2020-12-06 (×2): 0.5 mg via ORAL
  Filled 2020-12-05 (×2): qty 1

## 2020-12-05 NOTE — Progress Notes (Signed)
[place] [facility] 1:1 Observation Documentation  For the first (8) hours following discontinuation of 1:1 precautions, a progress note entry by nursing staff should be documented at least every 2 hours, reflecting the patient's behavior, condition, mood, and conversation.  Use the progress notes for additional entries.  Time 1:1 discontinued:  1300  Patient's Behavior:  pt is starting to c/o their new brace not being adequate. Pt is behaving childlike, similar to the presentation on arrival to [place].  Patient's Condition:  pt instructed to rest since recreation may have aggravated their back. Pt declined  Patient's Conversation:  pt is childlike and requesting to be back on 1:1 so they can have their prior brace.  Otelia Limes Wiley Magan 12/05/2020, 5:22 PM

## 2020-12-05 NOTE — Progress Notes (Signed)
PT Cancellation Note  Patient Details Name: Veronica Perez MRN: [id number] DOB: May 15, 1975   Cancelled Treatment:     Noted order was for pt to receive an abdominal binder for comfort. Instructed nurse and MD on how to order and call ortho tech if pt needs an abdominal binder. No skilled PT needs at this time. DCing the PT order. Thank you    Clide Dales 12/05/2020, 1:35 PM Gatha Mayer, PT, MPT Acute Rehabilitation Services Office: [phone] Pager: [phone] 12/05/2020
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
## 2020-12-05 NOTE — Progress Notes (Signed)
Nursing 1:1 note D:Pt observed sleeping in bed with eyes closed. RR even and unlabored. No distress noted. A: 1:1 observation continues for safety  R: pt remains safe  

## 2020-12-05 NOTE — Plan of Care (Signed)
  Problem: Activity: Goal: Interest or engagement in activities will improve Outcome: Progressing Goal: Sleeping patterns will improve Outcome: Progressing   

## 2020-12-05 NOTE — Progress Notes (Incomplete)
Nursing 1:1 note D:Pt observed sleeping in bed with eyes closed. RR even and unlabored. No distress noted. A: 1:1 observation continues for safety  R: Pt remains safe  

## 2020-12-05 NOTE — Progress Notes (Signed)
[place] [facility] 1:1 Observation Documentation  For the first (8) hours following discontinuation of 1:1 precautions, a progress note entry by nursing staff should be documented at least every 2 hours, reflecting the patient's behavior, condition, mood, and conversation.  Use the progress notes for additional entries.  Time 1:1 discontinued:  1300  Patient's Behavior:  pt has been calm and cooperative.  Patient's Condition:  pt denies all to this writer  Patient's Conversation:  pt continues to be animated with logical/coherent conversation.  Otelia Limes Angeleah Labrake 12/05/2020, 3:55 PM

## 2020-12-05 NOTE — Group Note (Signed)
[place] LCSW Group Therapy   12/05/2020 at 1:00pm   Type of Therapy:  Group Therapy: Value Clarification      Participation Level: Did not attend   Description of Group:  In this group, patients will learn to explore, identify, and clarify the values that influence their decisions and behavior and encourages them to build on their inner resources and strengths. This group will be clarifying, exporational and eductional, with patients participating in identifying their own values, identifying others values, and identifying societal values and their impact on their lives.      Therapeutic Goals: Patient will learn to identify values. Patient will learn to identify others values. Patient will learn ways to use their values in decisions they make in their daily lives. Patient will receive support and feedback from others     Therapeutic Modalities:  Acceptance and Commitment Therapy      Summary of Progress/Problems: Did not attend.

## 2020-12-05 NOTE — Progress Notes (Signed)
Pt didn't attend therapeutic relaxation group.

## 2020-12-05 NOTE — Group Note (Signed)
Recreation Therapy Group Note   Group Topic:Goal Setting  Group Date: 12/05/2020 Start Time: 6333 End Time: 1040 Facilitators: Victorino Sparrow, LRT/CTRS Location: [address] Dayroom   Goal Area(s) Addresses:  Patient will discuss goal with LRT. Patient will successfully complete sheet.   Activity Description: Patients were given a worksheet that focused on setting life goals.  Patients were given 6 categories (family, friends, work/school, spirituality, body and mental health).  For each category, patients were to identify what they do well, what they need to improve and set a goal for making the improvement.  LRT will note the 3 most important categories in the note.    Clinical Observations/Individualized Feedback: Activity was done as a 1:1 session.  Pt was anxious and continuously shaking her leg while in the chair.  Pt had her lipstick on and when given a compliment by LRT, pt brightened up a little.  Pt expressed doing well in taking care of her family by reminding them to do daily tasks.  Pt stated she needs no improvement in this area but made a goal of wanting to get better transportation in the next 6 months.  For body, pt does well with personal hygiene but wants to improve by taking a shower everyday.  Pt made a goal of taking a shower every other day.  Pt expressed with spirituality, she does well going to church.  Pt needs to improve by going to church more often on Sunday, Wednesday and watching it on T.V.  Pt set goal of going to church every week on Sunday, Wednesday and watching it on T.V).       Victorino Sparrow, LRT/CTRS 12/05/2020 12:51 PM

## 2020-12-05 NOTE — Progress Notes (Signed)
Pt didn't attend psycho-ed group.

## 2020-12-05 NOTE — Progress Notes (Signed)
Veronica Perez was in the dayroom much of the evening.  She attended evening wrap up group with good participation.  She took her hs medications without difficulty.  No prns required at this time.  She denied SI/HI or AVH.  She denied any pain or discomfort and appeared to be in no physical distress.  She had some difficulty falling asleep this evening and refused prn.  She is resting now appears to be asleep.  Q 15 minute checks maintained for safety.    12/05/20 2102  Psych Admission Type (Psych Patients Only)  Admission Status Involuntary  Psychosocial Assessment  Patient Complaints Anxiety  Eye Contact Fair  Facial Expression Animated;Anxious  Affect Anxious;Appropriate to circumstance  Speech Logical/coherent  Interaction Assertive  Motor Activity Slow  Appearance/Hygiene Unremarkable  Behavior Characteristics Cooperative;Appropriate to situation;Anxious  Mood Anxious;Pleasant  Thought Process  Coherency WDL  Content WDL  Delusions None reported or observed  Perception WDL  Hallucination None reported or observed  Judgment Poor  Confusion None  Danger to Self  Current suicidal ideation? Denies  Danger to Others  Danger to Others None reported or observed

## 2020-12-05 NOTE — Progress Notes (Signed)
Orthopedic Tech Progress Note Patient Details:  Veronica Perez 08-27-1975 [id number]  Ortho Devices Type of Ortho Device: Abdominal binder   Post Interventions Instructions Provided: Adjustment of device MD called and verbally gave order because it is not showing up correctly, gave abdominal binder to nurse.  Vernona Rieger 12/05/2020, 12:32 PM

## 2020-12-05 NOTE — Progress Notes (Signed)
1:1 note  Pt found in bed; compliant with medication administration. Pt denies any physical complaints or pain. Pt is still restless on approach. Pt is animated and pleasant. Pt was educated on possibly using a new brace for their back. Pt verbalizes understanding. Pt denies si/hi/ah/vh and verbally agrees to approach staff if these become apparent or before harming themselves/others while at [place].  A: Pt provided support and encouragement. Pt given medication per protocol and standing orders. Q83m safety checks implemented and continued. 1:1 continues for safety. R: Pt safe on the unit. Will continue to monitor.

## 2020-12-05 NOTE — Progress Notes (Signed)
Pt didn't attend orientation/goals group. 

## 2020-12-05 NOTE — [facility] Group Notes (Signed)
Adult Psychoeducational Group Note  Date:  12/05/2020 Time:  10:39 PM  Group Topic/Focus:  Crisis Planning:   The purpose of this group is to help patients create a crisis plan for use upon discharge or in the future, as needed.  Participation Level:  Active  Participation Quality:  Attentive  Affect:  Appropriate  Cognitive:  Alert  Insight: Good  Engagement in Group:  Engaged  Modes of Intervention:  Discussion  Additional Comments  Dalene Carrow 12/05/2020, 10:39 PM

## 2020-12-05 NOTE — Progress Notes (Signed)
[place] [facility] 1:1 Observation Documentation  For the first (8) hours following discontinuation of 1:1 precautions, a progress note entry by nursing staff should be documented at least every 2 hours, reflecting the patient's behavior, condition, mood, and conversation.  Use the progress notes for additional entries.  Time 1:1 discontinued:  1300  Patient's Behavior:  pt has been calm and cooperative.  Patient's Condition:  pt denies any physical pain. Pt is restless on approach.   Patient's Conversation:  pt is logical and coherent. Pt discussed their fiance and plans on discharge.   Otelia Limes Nelia Rogoff 12/05/2020, 1:02 PM

## 2020-12-05 NOTE — Progress Notes (Signed)
Pt awake and upset with relief sitter. Sitter cleaning her area after last sitter left and pt irritated because entire room wasn't disinfected. "It's like she don't care about me being sick. She just cleaning her area." Pt walking up and down the halls speaking loudly. "I can't go back in there for 10 minutes until the chemicals go away. That shit can make you sick. If you clean with apple cider vinegar, it's better for you." Spoke with pt in effort to calm her down. Used disinfecting wipes to clean door handles on room, bathroom and AC unit. Pt seems to be satisfied. Given a cup of water and pt returned to her room with sitter.

## 2020-12-06 MED ORDER — MELATONIN 3 MG PO TABS
3.0000 mg | ORAL_TABLET | Freq: Every day | ORAL | Status: DC
  Administered 2020-12-06 – 2020-12-13 (×8): 3 mg via ORAL
  Filled 2020-12-06 (×12): qty 1

## 2020-12-06 MED ORDER — LIDOCAINE 5 % EX PTCH
1.0000 | MEDICATED_PATCH | Freq: Every day | CUTANEOUS | Status: DC
  Administered 2020-12-06 – 2020-12-07 (×2): 1 via TRANSDERMAL
  Filled 2020-12-06 (×7): qty 1

## 2020-12-06 MED ORDER — TOPIRAMATE ER 100 MG PO SPRINKLE CAP24
200.0000 mg | EXTENDED_RELEASE_CAPSULE | Freq: Every day | ORAL | Status: DC
  Administered 2020-12-06 – 2020-12-08 (×3): 200 mg via ORAL
  Filled 2020-12-06 (×7): qty 2

## 2020-12-06 MED ORDER — MELATONIN 3 MG PO TABS
3.0000 mg | ORAL_TABLET | Freq: Every day | ORAL | Status: DC
  Filled 2020-12-06: qty 1

## 2020-12-06 MED ORDER — LORAZEPAM 0.5 MG PO TABS
0.2500 mg | ORAL_TABLET | Freq: Two times a day (BID) | ORAL | Status: AC
  Administered 2020-12-07 (×2): 0.25 mg via ORAL
  Filled 2020-12-06 (×2): qty 1

## 2020-12-06 MED ORDER — TOPIRAMATE 100 MG PO TABS
100.0000 mg | ORAL_TABLET | Freq: Two times a day (BID) | ORAL | Status: DC
  Filled 2020-12-06 (×2): qty 1
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
## 2020-12-06 NOTE — Progress Notes (Signed)
[place] [facility] 1:1 Observation Documentation  For the first (8) hours following discontinuation of 1:1 precautions, a progress note entry by nursing staff should be documented at least every 2 hours, reflecting the patient's behavior, condition, mood, and conversation.  Use the progress notes for additional entries.  Time 1:1 discontinued:  1300  Patient's Behavior:  pt continues to be restless  Patient's Condition:  pt denies still c/o not being able to use their back brace.  Patient's Conversation:  pt still displaying childlike behavior and lacks insight into their care with this writer having to reiterate multiple times why their original brace cannot be on the unit.   Otelia Limes Johnny Latu 12/06/2020, 9:46 AM

## 2020-12-06 NOTE — Progress Notes (Signed)
Pt denies SI/HI/AVH and verbally agrees to approach staff if these become apparent or before harming themselves/others. Rates depression 0/10. Rates anxiety 0/10. Rates pain 0/10. Pt at one point could not get thoughts together to say what she was wanting to say. Pt has been pleasant and kind. Pt was in group but did not want to participate. Scheduled medications administered to Pt, per MD orders. RN provided support and encouragement to Pt. Q15 min safety checks implemented and continued. Pt safe on the unit. RN will continue to monitor and intervene as needed.   12/06/20 0952  Psych Admission Type (Psych Patients Only)  Admission Status Involuntary  Psychosocial Assessment  Patient Complaints None  Eye Contact Fair  Facial Expression Animated  Affect Appropriate to circumstance  Speech Logical/coherent;Soft  Interaction Assertive  Motor Activity Restless;Slow  Appearance/Hygiene Unremarkable  Behavior Characteristics Cooperative;Calm;Appropriate to situation  Mood Pleasant  Aggressive Behavior  Effect No apparent injury  Thought Horticulturist, commercial of ideas  Content WDL  Delusions None reported or observed  Perception WDL  Hallucination None reported or observed  Judgment Poor  Confusion None  Danger to Self  Current suicidal ideation? Denies  Danger to Others  Danger to Others None reported or observed

## 2020-12-06 NOTE — Progress Notes (Addendum)
[facility] MD Progress Note  12/06/2020 4:13 PM Veronica Perez  MRN:  [id number]  Subjective:   Patient is a 45 year old female with history of bipolar affective disorder presenting involuntarily from [place] [facility] ED due to worsening anxiety, paranoia, and self-neglect, physical aggression and HI.    Overnight Events: No acute events, no agitation PRNs, patient did attend groups. No longer on 1:1, as patient received the abdominal binder. Patient slept Number of Hours: 6.5  CIWA-Ar Total: 1   Patient was evaluated this AM, initially seen in the day room talking on the phone, without any bracing for back support.  Stated that she feels good, slept alright, and that her appetite is good.  Patient was concerned that the new abdominal binder have inadequate support for her back, and that she wants to be old back brace again.  Discussed with patient, that prolonged structured back brace usage will weaken her back muscles, and later on make her pain worse.  Patient was partially receptive to this, said that because the abdominal binder was so unsupportive, said that she would rather not use at all.  She was still concerned about her back pain, stated that it feels achy.  Brought up methimazole again, patient said she would rather wait and think about is more. Patient still wants to go home, otherwise has no other acute complaints.  She denied SI/HI/AVH, delusions.  Principal Problem: Bipolar I disorder, current or most recent episode manic, with psychotic features ([place]) Diagnosis:  Principal Problem:   Bipolar I disorder, current or most recent episode manic, with psychotic features ([place]) Active Problems:   PTSD (post-traumatic stress disorder)   Catatonia  Total Time spent with patient: 30 minutes  Past Psychiatric History: see H&P  Past Medical History:  Past Medical History:  Diagnosis Date   Bipolar disorder ([place])    Depression    Former smoker, stopped smoking many years ago    HSV (herpes  simplex virus) infection    Hypothyroid    Low TSH level 05/30/2016   Panic attacks    Thyroid disease     Past Surgical History:  Procedure Laterality Date   ADENOIDECTOMY     OVARIAN CYST SURGERY     TONSILLECTOMY     Family History:  Family History  Problem Relation Age of Onset   Diabetes Father    Heart disease Father    Cancer Maternal Grandmother        breast cancer   Bipolar disorder Mother    Thyroid disease Neg Hx    Family Psychiatric  History: patient states mom and dad's side have depression  Social History:  Social History   Substance and Sexual Activity  Alcohol Use Not Currently   Comment: denies     Social History   Substance and Sexual Activity  Drug Use No    Social History   Socioeconomic History   Marital status: Single    Spouse name: Not on file   Number of children: Not on file   Years of education: Not on file   Highest education level: High school graduate  Occupational History   Not on file  Tobacco Use   Smoking status: Every Day    Packs/day: 25.00    Types: Cigarettes   Smokeless tobacco: Never  Vaping Use   Vaping Use: Never used  Substance and Sexual Activity   Alcohol use: Not Currently    Comment: denies   Drug use: No   Sexual activity: Yes  Birth control/protection: None  Other Topics Concern   Not on file  Social History Narrative   Not on file   Social Determinants of Health   Financial Resource Strain: Not on file  Food Insecurity: Not on file  Transportation Needs: Not on file  Physical Activity: Not on file  Stress: Not on file  Social Connections: Not on file   Sleep: Good  Appetite:  Good  Current Medications: Current Facility-Administered Medications  Medication Dose Route Frequency Provider Last Rate Last Admin   acetaminophen (TYLENOL) tablet 650 mg  650 mg Oral Q6H PRN Suella Broad, FNP   650 mg at 12/02/20 2054   alum & mag hydroxide-simeth (MAALOX/MYLANTA) 200-200-20 MG/5ML  suspension 30 mL  30 mL Oral Q4H PRN Starkes-Perry, Gayland Curry, FNP       benztropine (COGENTIN) tablet 0.5 mg  0.5 mg Oral BID PRN Nelda Marseille, Amy E, MD       docusate sodium (COLACE) capsule 100 mg  100 mg Oral Daily France Ravens, MD   100 mg at 12/06/20 0952   doxycycline (VIBRA-TABS) tablet 100 mg  100 mg Oral Q12H Singleton, Amy E, MD   100 mg at 12/06/20 0952   lidocaine (LIDODERM) 5 % 1 patch  1 patch Transdermal Daily Merrily Brittle, DO       LORazepam (ATIVAN) injection 1 mg  1 mg Intramuscular Q6H PRN Harlow Asa, MD       [START ON 12/07/2020] LORazepam (ATIVAN) tablet 0.25 mg  0.25 mg Oral BID Merrily Brittle, DO       LORazepam (ATIVAN) tablet 0.5 mg  0.5 mg Oral BID Merrily Brittle, DO   0.5 mg at 12/06/20 0952   OLANZapine zydis (ZYPREXA) disintegrating tablet 10 mg  10 mg Oral Q8H PRN France Ravens, MD       And   LORazepam (ATIVAN) tablet 1 mg  1 mg Oral PRN France Ravens, MD       And   ziprasidone (GEODON) injection 20 mg  20 mg Intramuscular PRN France Ravens, MD       magnesium hydroxide (MILK OF MAGNESIA) suspension 30 mL  30 mL Oral Daily PRN Suella Broad, FNP   30 mL at 12/02/20 1513   melatonin tablet 3 mg  3 mg Oral QPC supper Merrily Brittle, DO       neomycin-bacitracin-polymyxin (NEOSPORIN) ointment   Topical BID Harlow Asa, MD   Given at 12/05/20 1605   propranolol (INDERAL) tablet 10 mg  10 mg Oral TID Harlow Asa, MD   10 mg at 12/06/20 1154   topiramate ER (QUDEXY XR) capsule 200 mg  200 mg Oral QHS Merrily Brittle, DO        Lab Results:  No results found for this or any previous visit (from the past 48 hour(s)).   Blood Alcohol level:  Lab Results  Component Value Date   ETH <10 11/25/2020   ETH <10 43/32/9518    Metabolic Disorder Labs: Lab Results  Component Value Date   HGBA1C 5.0 11/28/2020   MPG 96.8 11/28/2020   MPG 96.8 06/28/2019   Lab Results  Component Value Date   PROLACTIN 13.1 06/28/2019   PROLACTIN 69.3 (H) 09/17/2016   Lab  Results  Component Value Date   CHOL 141 11/28/2020   TRIG 214 (H) 11/28/2020   HDL 40 (L) 11/28/2020   CHOLHDL 3.5 11/28/2020   VLDL 43 (H) 11/28/2020   LDLCALC 58 11/28/2020   LDLCALC 103 (  H) 06/28/2019    Physical Findings:  Musculoskeletal: Strength & Muscle Tone: within normal limits Gait & Station: ambulating without a brace, steadily, no gait abnormalities Patient leans: N/A  Psychiatric Specialty Exam:  Presentation  General Appearance: Hair washed, in pigtails, less makeup  Eye Contact: Good  Speech: Clear and coherent, intermittent stuttering and delay in speech, nonpressured  Speech Volume:Normal  Handedness:Right   Mood and Affect  Mood: Guarded, childlike at times, less anxious than yesterday  Affect: Constricted   Thought Process  Thought Processes: More coherent and linear, superficially goal directed  Orientation:Full (Time, Place and Person)  Thought Content: Denies AVH, ideas of reference, or first rank symptoms; is not grossly responding to internal/external stimuli on exam.  Continued to perseverate on needing her TSLO back brace and pain.  History of Schizophrenia/Schizoaffective disorder: Questionable diagnosis of schizoaffective per daughter on admission  Duration of Psychotic Symptoms:Greater than six months  Hallucinations:Denied  Ideas of Reference:None  Suicidal Thoughts:Denied  Homicidal Thoughts:Denied   Sensorium  Memory: Fair  Judgment: Poor  Insight: Lacking   Executive Functions  Concentration: Good  Attention Span: Good  Recall: [place]   Psychomotor Activity  Psychomotor Activity: Still restless and fidgety on exam, but only when she gets anxious  Assets  Assets:Communication Skills; Desire for Improvement; Housing; Social Support  Sleep  Patient slept Number of Hours: 6.5   Physical Exam Vitals and nursing note reviewed.  Constitutional:      Appearance:  Normal appearance. She is normal weight.  HENT:     Head: Normocephalic and atraumatic.  Pulmonary:     Effort: Pulmonary effort is normal.  Musculoskeletal:        General: No swelling or tenderness. Normal range of motion.     Comments: No midline spinal tenderness, no MSK tenderness.  Neurological:     Mental Status: She is oriented to person, place, and time.     Motor: No weakness.     Coordination: Coordination normal.     Gait: Gait normal.   Review of Systems  Respiratory:  Negative for shortness of breath.   Cardiovascular:  Negative for chest pain.  Gastrointestinal:  Negative for constipation, diarrhea, nausea and vomiting.  Blood pressure 117/67, pulse 79, temperature 98.2 F (36.8 C), temperature source Oral, resp. rate 16, height 5\' 3"  (1.6 m), weight 76.2 kg, SpO2 98 %. Body mass index is 29.76 kg/m.  Treatment Plan Summary: Daily contact with patient to assess and evaluate symptoms and progress in treatment and Medication management  ASSESSMENT Patient is a 45 year old female with history of bipolar affective disorder presenting involuntarily from [place] [facility] due to worsening anxiety, paranoia,and poor self-care.  On admission patient had signs of catatonia that improved with ativan challenge. Her catatonia has resolved but she has residual disorganized thinking, delusions, and irritability. There is question as to whether she has a schizoaffective d/o bipolar type diagnosis.  Plan to continue tapering Ativan as catatonia has improved.   Uncertain whether restlessness is related to her being on a lower dose of Ativan, higher dose of antipsychotic, or is due to untreated hyperthyroidism.     PLAN #Bipolar I manic with psychotic features (r/o schizoaffective d/o bipolar type) Patient still has residual delusions and disorganized thinking, we will continue SGA, and titrate up as needed. Also presented with restlessness and possible akathisia, unclear etiology, could  be 2/2 possible hyperthyroidism.  Risperdal has high risk of akathisia, we will discontinue to see  if her restlessness has improved.  We will also change her Topamax IR to ER, to simplify her medical regimen. Continue agitation protocol PRN DC Risperdal M tab 2 mg qAM and 3mg  qPM Continue Cogentin 0.5mg  bid PRN for EPS Change Topamax 50 mg twice daily to Topamax 100 mg nightly Continue propranolol 10 mg tid daily   #Catatonia, resolved Patient was catatonic on admission, which has resolved for now. Will start tapering Ativan, decreasing it by max 30% daily and monitor for relapse. Continue Ativan taper  Continue CIWA  Multinodular goiter/hyperthyroidism: Unsure if symptomatic considering other involving factors. TSH<0.01, Free T4 1.35. Will have patient follow-up for outpatient workup. Currently refusing methimazole due to unspecified side effect Patient continues to refuse medication for hyperthyroidism  Continue Inderal 10mg  TID   46mm right adrenal adenoma noted on CT scan 10/19/20 Will need PCP/endocrine f/u after discharge     3. Safety and Monitoring: Involuntary admission to inpatient psychiatric unit for safety, stabilization and treatment Daily contact with patient to assess and evaluate symptoms and progress in treatment Patient's case to be discussed in multi-disciplinary team meeting Observation Level : q15 minute checks Vital signs: q12 hours Precautions: suicide, elopement, and assault   4. Discharge Planning: Social work and case management to assist with discharge planning and identification of hospital follow-up needs prior to discharge Discharge Concerns: Need to establish a safety plan; Medication compliance and effectiveness Discharge Goals: Return home with outpatient referrals for mental health follow-up including medication management/psychotherapy  Merrily Brittle, DO Psychiatry Resident, PGY-1 [facility]  12/06/2020, 4:13 PM

## 2020-12-06 NOTE — Progress Notes (Signed)
Adult Psychoeducational Group Note  Date:  12/06/2020 Time:  9:14 PM  Group Topic/Focus:  Wrap-Up Group:   The focus of this group is to help patients review their daily goal of treatment and discuss progress on daily workbooks.  Participation Level:  Active  Participation Quality:  Appropriate  Affect:  Appropriate  Cognitive:  Appropriate  Insight: Appropriate  Engagement in Group:  Engaged  Modes of Intervention:  Discussion  Additional Comments:  patient said her day was a 49. Her goal make new friends and she achieved her goal coping skills stop being so shy.  Lenice Llamas Long 12/06/2020, 9:14 PM

## 2020-12-06 NOTE — Plan of Care (Signed)
  Problem: Coping: Goal: Ability to identify and develop effective coping behavior will improve Outcome: Progressing   Problem: Self-Concept: Goal: Level of anxiety will decrease Outcome: Progressing   Problem: Education: Goal: Emotional status will improve Outcome: Progressing

## 2020-12-06 NOTE — BHH Counselor (Signed)
Events affecting the discharge plan:  - Awaiting MD approval  Interventions by CSW:  - CSW resent ACTT referral to [facility] Emotional response of the patient/family to the plan of care: - Pt continues to be agreeable to discharge plans   Darletta Moll MSW, [facility]

## 2020-12-06 NOTE — Group Note (Signed)
Recreation Therapy Group Note   Group Topic:Leisure Education  Group Date: 12/06/2020 Start Time: 8144 End Time: 1015 Facilitators: Victorino Sparrow, LRT/CTRS Location: [address] Dayroom   Goal Area(s) Addresses:  Patient will identify positive leisure activities for use post discharge. Patient will identify at least one positive benefit of participation in leisure activities.  Patient will work effectively work with peer by sharing ideas and contributing to Counselling psychologist.  Group Description: In pairs, patients were asked to create a game with their teammate. Team's were tasked with designing a game, including a Name, Description of Game, Equipment/Supplies, Rules, and Number of players needed.    Affect/Mood: Anxious   Participation Level: None   Participation Quality: None   Behavior: Disinterested   Speech/Thought Process: None   Insight: None   Judgement: None   Modes of Intervention: Activity   Patient Response to Interventions:  None   Education Outcome:  Acknowledges education and In group clarification offered    Clinical Observations/Individualized Feedback: Pt did not participate in activity.  Pt stated she just wanted some water and proceeded to get water and observe peers.    Plan: Continue to engage patient in RT group sessions 2-3x/week.   Victorino Sparrow, LRT/CTRS 12/06/2020 12:11 PM

## 2020-12-06 NOTE — Progress Notes (Signed)
     12/06/20 2138  Psych Admission Type (Psych Patients Only)  Admission Status Involuntary  Psychosocial Assessment  Patient Complaints None  Eye Contact Fair  Facial Expression Animated;Anxious  Affect Appropriate to circumstance  Speech Logical/coherent;Soft  Interaction Assertive  Motor Activity Restless;Slow  Appearance/Hygiene Unremarkable  Behavior Characteristics Cooperative  Mood Pleasant;Anxious  Thought Horticulturist, commercial of ideas  Content WDL  Delusions None reported or observed  Perception WDL  Hallucination None reported or observed  Judgment Poor  Confusion None  Danger to Self  Current suicidal ideation? Denies  Danger to Others  Danger to Others None reported or observed

## 2020-12-07 ENCOUNTER — Encounter (HOSPITAL_COMMUNITY): Payer: Self-pay

## 2020-12-07 MED ORDER — QUETIAPINE FUMARATE ER 50 MG PO TB24
150.0000 mg | ORAL_TABLET | Freq: Every day | ORAL | Status: DC
  Filled 2020-12-07 (×3): qty 3

## 2020-12-07 NOTE — Progress Notes (Signed)
[facility] MD Progress Note  12/07/2020 6:41 PM Veronica Perez  MRN:  [id number]  Subjective:   Patient is a 45 year old female with history of bipolar affective disorder presenting involuntarily from [place] long ED due to worsening anxiety, paranoia, and self-neglect, physical aggression and HI.  [facility] Day 11    Overnight Events: No acute events, no agitation PRNs, patient did attend groups. No longer on 1:1, as patient received the abdominal binder. Patient slept Number of Hours: 5.75  CIWA-Ar Total: 1   Patient was evaluated this AM, initially seen in her room. She stated that she slept well, that her appetite is good, and that her mood is all right.  Stated that she does not like the hallway that she is on right now.  Stated that there is no customer service, compared to the other hour.  Stated that she feels isolated over here.  Stated that she is withering away because she does not have any water.  When asked if she has asked staff to have water, she just repeated that her kidneys have withering away.  Patient then repeatedly stated "I want to go back to the other hall".  Patient became teary and talked about feeling neglected, that she just wanted to let her mom know that smoking is dangerous especially around oxygen and open hand sanitizer.  She stated that she loves her child, that she would never neglect them.  Patient was also very upset because she poked her eye accidentally with her Bartholome Bill and was upset that she has to keep asking me RN for more saline for her eyes, rather than getting a bottle herself.  She denied SI/HI/AVH, delusions.  Principal Problem: Bipolar I disorder, current or most recent episode manic, with psychotic features ([place]) Diagnosis:  Principal Problem:   Bipolar I disorder, current or most recent episode manic, with psychotic features ([place]) Active Problems:   PTSD (post-traumatic stress disorder)   Catatonia  Total Time spent with patient: 30 minutes  Past  Psychiatric History: see H&P  Past Medical History:  Past Medical History:  Diagnosis Date   Bipolar disorder ([place])    Depression    Former smoker, stopped smoking many years ago    HSV (herpes simplex virus) infection    Hypothyroid    Low TSH level 05/30/2016   Panic attacks    Thyroid disease     Past Surgical History:  Procedure Laterality Date   ADENOIDECTOMY     OVARIAN CYST SURGERY     TONSILLECTOMY     Family History:  Family History  Problem Relation Age of Onset   Diabetes Father    Heart disease Father    Cancer Maternal Grandmother        breast cancer   Bipolar disorder Mother    Thyroid disease Neg Hx    Family Psychiatric  History: patient states mom and dad's side have depression  Social History:  Social History   Substance and Sexual Activity  Alcohol Use Not Currently   Comment: denies     Social History   Substance and Sexual Activity  Drug Use No    Social History   Socioeconomic History   Marital status: Single    Spouse name: Not on file   Number of children: Not on file   Years of education: Not on file   Highest education level: High school graduate  Occupational History   Not on file  Tobacco Use   Smoking status: Every Day  Packs/day: 25.00    Types: Cigarettes   Smokeless tobacco: Never  Vaping Use   Vaping Use: Never used  Substance and Sexual Activity   Alcohol use: Not Currently    Comment: denies   Drug use: No   Sexual activity: Yes    Birth control/protection: None  Other Topics Concern   Not on file  Social History Narrative   Not on file   Social Determinants of Health   Financial Resource Strain: Not on file  Food Insecurity: Not on file  Transportation Needs: Not on file  Physical Activity: Not on file  Stress: Not on file  Social Connections: Not on file   Sleep: Good  Appetite:  Good  Current Medications: Current Facility-Administered Medications  Medication Dose Route Frequency Provider  Last Rate Last Admin   acetaminophen (TYLENOL) tablet 650 mg  650 mg Oral Q6H PRN Suella Broad, FNP   650 mg at 12/02/20 2054   alum & mag hydroxide-simeth (MAALOX/MYLANTA) 200-200-20 MG/5ML suspension 30 mL  30 mL Oral Q4H PRN Starkes-Perry, Gayland Curry, FNP       benztropine (COGENTIN) tablet 0.5 mg  0.5 mg Oral BID PRN Nelda Marseille, Amy E, MD       docusate sodium (COLACE) capsule 100 mg  100 mg Oral Daily France Ravens, MD   100 mg at 12/07/20 0820   doxycycline (VIBRA-TABS) tablet 100 mg  100 mg Oral Q12H Singleton, Amy E, MD   100 mg at 12/07/20 0820   lidocaine (LIDODERM) 5 % 1 patch  1 patch Transdermal Daily Merrily Brittle, DO   1 patch at 12/07/20 0820   LORazepam (ATIVAN) injection 1 mg  1 mg Intramuscular Q6H PRN Harlow Asa, MD       OLANZapine zydis (ZYPREXA) disintegrating tablet 10 mg  10 mg Oral Q8H PRN France Ravens, MD       And   LORazepam (ATIVAN) tablet 1 mg  1 mg Oral PRN France Ravens, MD       And   ziprasidone (GEODON) injection 20 mg  20 mg Intramuscular PRN France Ravens, MD       magnesium hydroxide (MILK OF MAGNESIA) suspension 30 mL  30 mL Oral Daily PRN Suella Broad, FNP   30 mL at 12/02/20 1513   melatonin tablet 3 mg  3 mg Oral QPC supper Merrily Brittle, DO   3 mg at 12/06/20 1957   propranolol (INDERAL) tablet 10 mg  10 mg Oral TID Harlow Asa, MD   10 mg at 12/07/20 1709   QUEtiapine (SEROQUEL XR) 24 hr tablet 150 mg  150 mg Oral QHS Merrily Brittle, DO       topiramate ER (QUDEXY XR) capsule 200 mg  200 mg Oral QHS Merrily Brittle, DO   200 mg at 12/06/20 2139    Lab Results:  No results found for this or any previous visit (from the past 43 hour(s)).   Blood Alcohol level:  Lab Results  Component Value Date   ETH <10 11/25/2020   ETH <10 81/82/9937    Metabolic Disorder Labs: Lab Results  Component Value Date   HGBA1C 5.0 11/28/2020   MPG 96.8 11/28/2020   MPG 96.8 06/28/2019   Lab Results  Component Value Date   PROLACTIN 13.1  06/28/2019   PROLACTIN 69.3 (H) 09/17/2016   Lab Results  Component Value Date   CHOL 141 11/28/2020   TRIG 214 (H) 11/28/2020   HDL 40 (L) 11/28/2020  CHOLHDL 3.5 11/28/2020   VLDL 43 (H) 11/28/2020   LDLCALC 58 11/28/2020   LDLCALC 103 (H) 06/28/2019    Physical Findings:  Musculoskeletal: Strength & Muscle Tone: within normal limits Gait & Station: ambulating without a brace, steadily, no gait abnormalities Patient leans: N/A  Psychiatric Specialty Exam:  Presentation  General Appearance: Hair washed, in high ponytail, no make-up  Eye Contact: Good  Speech: Clear and coherent, intermittent stuttering and delay in speech, nonpressured  Speech Volume:Normal  Handedness:Right   Mood and Affect  Mood: Guarded, childlike at times, anxious  Affect: Labile, congruent, teary, upset, angry   Thought Process  Thought Processes: More coherent and linear, superficially goal directed  Orientation:Full (Time, Place and Person)  Thought Content: Denies AVH, ideas of reference, or first rank symptoms; is not grossly responding to internal/external stimuli on exam.  Perseverate on her eye, as she does not like this new hall, and how no one should abandon or ignore their children.  History of Schizophrenia/Schizoaffective disorder: Questionable diagnosis of schizoaffective per daughter on admission  Duration of Psychotic Symptoms:Greater than six months  Hallucinations:Denied  Ideas of Reference:None  Suicidal Thoughts:Denied  Homicidal Thoughts:Denied   Sensorium  Memory: Fair  Judgment: Poor  Insight: Lacking   Executive Functions  Concentration: Good  Attention Span: Good  Recall: [place]   Psychomotor Activity  Psychomotor Activity: Still restless and fidgety on exam, but only when she gets anxious  Assets  Assets:Communication Skills; Desire for Improvement; Housing; Social Support  Sleep  Patient slept  Number of Hours: 5.75   Physical Exam Vitals and nursing note reviewed.  Constitutional:      Appearance: Normal appearance. She is normal weight.  HENT:     Head: Normocephalic and atraumatic.  Pulmonary:     Effort: Pulmonary effort is normal.  Musculoskeletal:        General: No swelling or tenderness. Normal range of motion.     Comments: No midline spinal tenderness, no MSK tenderness.  Neurological:     Mental Status: She is oriented to person, place, and time.     Motor: No weakness.     Coordination: Coordination normal.     Gait: Gait normal.   Review of Systems  Respiratory:  Negative for shortness of breath.   Cardiovascular:  Negative for chest pain.  Gastrointestinal:  Negative for constipation, diarrhea, nausea and vomiting.  Blood pressure 129/76, pulse 82, temperature 97.6 F (36.4 C), temperature source Oral, resp. rate 16, height 5\' 3"  (1.6 m), weight 76.2 kg, SpO2 98 %. Body mass index is 29.76 kg/m.  Treatment Plan Summary: Daily contact with patient to assess and evaluate symptoms and progress in treatment and Medication management  ASSESSMENT Patient is a 45 year old female with history of bipolar affective disorder presenting involuntarily from [place] long ED due to worsening anxiety, paranoia,and poor self-care.  On admission patient had signs of catatonia that improved with ativan challenge. Her catatonia has resolved but she has residual disorganized thinking, delusions, and irritability. There is question as to whether she has a schizoaffective d/o bipolar type diagnosis.  Plan to continue tapering Ativan as catatonia has improved.     PLAN #Bipolar I manic with psychotic features (r/o schizoaffective d/o bipolar type) Patient still has residual delusions and disorganized thinking, we will continue SGA, and titrate up as needed. Also presented with restlessness and possible akathisia, unclear etiology, could be 2/2 possible hyperthyroidism.  Risperdal  has high risk of akathisia,  we will discontinue to see if her restlessness has improved.  We will also change her Topamax IR to ER, to simplify her medical regimen.  Patient's mood is still labile, we will start her on Seroquel 150 mg nightly. Continue agitation protocol PRN Continue Cogentin 0.5mg  bid PRN for EPS Continue topiramate ER 200 mg nightly Continue propranolol 10 mg tid daily  Start Seroquel XR 150 mg nightly  #Catatonia, resolved Patient was catatonic on admission, which has resolved for now.  Ativan taper completed Continue CIWA  Multinodular goiter/hyperthyroidism: Unsure if symptomatic considering other involving factors. TSH<0.01, Free T4 1.35. Will have patient follow-up for outpatient workup. Currently refusing methimazole due to unspecified side effect. Patient continues to refuse medication for hyperthyroidism  Continue Inderal 10mg  TID   46mm right adrenal adenoma noted on CT scan 10/19/20 Will need PCP/endocrine f/u after discharge     3. Safety and Monitoring: Involuntary admission to inpatient psychiatric unit for safety, stabilization and treatment Daily contact with patient to assess and evaluate symptoms and progress in treatment Patient's case to be discussed in multi-disciplinary team meeting Observation Level : q15 minute checks Vital signs: q12 hours Precautions: suicide, elopement, and assault   4. Discharge Planning: Social work and case management to assist with discharge planning and identification of hospital follow-up needs prior to discharge Discharge Concerns: Need to establish a safety plan; Medication compliance and effectiveness Discharge Goals: Return home with outpatient referrals for mental health follow-up including medication management/psychotherapy  Merrily Brittle, DO Psychiatry Resident, PGY-1 [facility]  12/07/2020, 6:41 PM

## 2020-12-07 NOTE — Group Note (Signed)
LCSW Group Therapy Note   Group Date: 12/07/2020 Start Time: 1300 End Time: 1400   Type of Therapy and Topic:  Group Therapy: Boundaries  Participation Level:  Did Not Attend  Description of Group: This group will address the use of boundaries in their personal lives. Patients will explore why boundaries are important, the difference between healthy and unhealthy boundaries, and negative and postive outcomes of different boundaries and will look at how boundaries can be crossed.  Patients will be encouraged to identify current boundaries in their own lives and identify what kind of boundary is being set. Facilitators will guide patients in utilizing problem-solving interventions to address and correct types boundaries being used and to address when no boundary is being used. Understanding and applying boundaries will be explored and addressed for obtaining and maintaining a balanced life. Patients will be encouraged to explore ways to assertively make their boundaries and needs known to significant others in their lives, using other group members and facilitator for role play, support, and feedback.  Therapeutic Goals:  1.  Patient will identify areas in their life where setting clear boundaries could be  used to improve their life.  2.  Patient will identify signs/triggers that a boundary is not being respected. 3.  Patient will identify two ways to set boundaries in order to achieve balance in  their lives: 4.  Patient will demonstrate ability to communicate their needs and set boundaries  through discussion and/or role plays  Summary of Patient Progress:  Did not attend  Therapeutic Modalities:   Cognitive [place], LCSW 12/07/2020  2:13 PM

## 2020-12-07 NOTE — Group Note (Signed)
Recreation Therapy Group Note   Group Topic:Stress Management  Group Date: 12/07/2020 Start Time: 0930 End Time: 0950 Facilitators: Victorino Sparrow, LRT/CTRS Location: [address] Dayroom   Goal Area(s) Addresses:  Patient will identify positive stress management techniques. Patient will identify benefits of using stress management post d/c.  Group Description:  Meditation.  LRT played a meditation that focused on forgiveness of self and how it can be hard to show grace to oneself after a bad decision or not accomplishing a goal.  Patients were to listen and follow along with the meditation as it played to engage in activity.  LRT also gave patients options on platforms to use other stress management techniques such as Youtube, Apps and the Internet.     Affect/Mood: Appropriate   Participation Level: Engaged   Participation Quality: Independent   Behavior: Appropriate   Speech/Thought Process: Focused   Insight: Good   Judgement: Good   Modes of Intervention: Meditation   Patient Response to Interventions:  Engaged   Education Outcome:  Acknowledges education and In group clarification offered    Clinical Observations/Individualized Feedback: Pt attended and participated in group.    Plan: Continue to engage patient in RT group sessions 2-3x/week.   Victorino Sparrow, LRT/CTRS 12/07/2020 11:41 AM

## 2020-12-07 NOTE — BH IP Treatment Plan (Signed)
Interdisciplinary Treatment and Diagnostic Plan Update  12/07/2020 Time of Session:  Veronica Perez MRN: [id number]  Principal Diagnosis: Bipolar I disorder, current or most recent episode manic, with psychotic features ([name])  Secondary Diagnoses: Principal Problem:   Bipolar I disorder, current or most recent episode manic, with psychotic features ([name]) Active Problems:   PTSD (post-traumatic stress disorder)   Catatonia   Current Medications:  Current Facility-Administered Medications  Medication Dose Route Frequency Provider Last Rate Last Admin   acetaminophen (TYLENOL) tablet 650 mg  650 mg Oral Q6H PRN Suella Broad, FNP   650 mg at 12/02/20 2054   alum & mag hydroxide-simeth (MAALOX/MYLANTA) 200-200-20 MG/5ML suspension 30 mL  30 mL Oral Q4H PRN Suella Broad, FNP       benztropine (COGENTIN) tablet 0.5 mg  0.5 mg Oral BID PRN Harlow Asa, MD       docusate sodium (COLACE) capsule 100 mg  100 mg Oral Daily France Ravens, MD   100 mg at 12/07/20 0820   doxycycline (VIBRA-TABS) tablet 100 mg  100 mg Oral Q12H Singleton, Amy E, MD   100 mg at 12/07/20 0820   lidocaine (LIDODERM) 5 % 1 patch  1 patch Transdermal Daily Merrily Brittle, DO   1 patch at 12/07/20 0820   LORazepam (ATIVAN) injection 1 mg  1 mg Intramuscular Q6H PRN Harlow Asa, MD       LORazepam (ATIVAN) tablet 0.25 mg  0.25 mg Oral BID Merrily Brittle, DO   0.25 mg at 12/07/20 0826   OLANZapine zydis (ZYPREXA) disintegrating tablet 10 mg  10 mg Oral Q8H PRN France Ravens, MD       And   LORazepam (ATIVAN) tablet 1 mg  1 mg Oral PRN France Ravens, MD       And   ziprasidone (GEODON) injection 20 mg  20 mg Intramuscular PRN France Ravens, MD       magnesium hydroxide (MILK OF MAGNESIA) suspension 30 mL  30 mL Oral Daily PRN Suella Broad, FNP   30 mL at 12/02/20 1513   melatonin tablet 3 mg  3 mg Oral QPC supper Merrily Brittle, DO   3 mg at 12/06/20 1957   propranolol (INDERAL) tablet 10 mg  10 mg  Oral TID Harlow Asa, MD   10 mg at 12/07/20 1246   topiramate ER (QUDEXY XR) capsule 200 mg  200 mg Oral QHS Merrily Brittle, DO   200 mg at 12/06/20 2139   PTA Medications: Medications Prior to Admission  Medication Sig Dispense Refill Last Dose   propranolol (INDERAL) 10 MG tablet Take 0.5 tablets (5 mg total) by mouth 2 (two) times daily. 30 tablet 0    QUEtiapine (SEROQUEL) 25 MG tablet Take 1 tablet (25 mg total) by mouth at bedtime. (Patient taking differently: Take 25 mg by mouth 2 (two) times daily.) 30 tablet 0     Patient Stressors: Health problems   Medication change or noncompliance   Traumatic event    Patient Strengths: Supportive family/friends   Treatment Modalities: Medication Management, Group therapy, Case management,  1 to 1 session with clinician, Psychoeducation, Recreational therapy.   Physician Treatment Plan for Primary Diagnosis: Bipolar I disorder, current or most recent episode manic, with psychotic features ([place]) Long Term Goal(s): Improvement in symptoms so as ready for discharge   Short Term Goals: Ability to identify changes in lifestyle to reduce recurrence of condition will improve Ability to verbalize feelings will improve Ability  to disclose and discuss suicidal ideas Ability to demonstrate self-control will improve Ability to identify and develop effective coping behaviors will improve Ability to maintain clinical measurements within normal limits will improve Compliance with prescribed medications will improve Ability to identify triggers associated with substance abuse/mental health issues will improve  Medication Management: Evaluate patient's response, side effects, and tolerance of medication regimen.  Therapeutic Interventions: 1 to 1 sessions, Unit Group sessions and Medication administration.  Evaluation of Outcomes: Progressing  Physician Treatment Plan for Secondary Diagnosis: Principal Problem:   Bipolar I disorder, current or  most recent episode manic, with psychotic features ([place]) Active Problems:   PTSD (post-traumatic stress disorder)   Catatonia  Long Term Goal(s): Improvement in symptoms so as ready for discharge   Short Term Goals: Ability to identify changes in lifestyle to reduce recurrence of condition will improve Ability to verbalize feelings will improve Ability to disclose and discuss suicidal ideas Ability to demonstrate self-control will improve Ability to identify and develop effective coping behaviors will improve Ability to maintain clinical measurements within normal limits will improve Compliance with prescribed medications will improve Ability to identify triggers associated with substance abuse/mental health issues will improve     Medication Management: Evaluate patient's response, side effects, and tolerance of medication regimen.  Therapeutic Interventions: 1 to 1 sessions, Unit Group sessions and Medication administration.  Evaluation of Outcomes: Progressing   RN Treatment Plan for Primary Diagnosis: Bipolar I disorder, current or most recent episode manic, with psychotic features ([place]) Long Term Goal(s): Knowledge of disease and therapeutic regimen to maintain health will improve  Short Term Goals: Ability to demonstrate self-control, Ability to participate in decision making will improve, and Ability to verbalize feelings will improve  Medication Management: RN will administer medications as ordered by provider, will assess and evaluate patient's response and provide education to patient for prescribed medication. RN will report any adverse and/or side effects to prescribing provider.  Therapeutic Interventions: 1 on 1 counseling sessions, Psychoeducation, Medication administration, Evaluate responses to treatment, Monitor vital signs and CBGs as ordered, Perform/monitor CIWA, COWS, AIMS and Fall Risk screenings as ordered, Perform wound care treatments as ordered.  Evaluation  of Outcomes: Progressing   LCSW Treatment Plan for Primary Diagnosis: Bipolar I disorder, current or most recent episode manic, with psychotic features ([place]) Long Term Goal(s): Safe transition to appropriate next level of care at discharge, Engage patient in therapeutic group addressing interpersonal concerns.  Short Term Goals: Engage patient in aftercare planning with referrals and resources, Increase social support, and Increase ability to appropriately verbalize feelings  Therapeutic Interventions: Assess for all discharge needs, 1 to 1 time with Social worker, Explore available resources and support systems, Assess for adequacy in community support network, Educate family and significant other(s) on suicide prevention, Complete Psychosocial Assessment, Interpersonal group therapy.  Evaluation of Outcomes: Progressing   Progress in Treatment: Attending groups: Yes. Participating in groups: Yes. Taking medication as prescribed: Yes. Toleration medication: Yes. Family/Significant other contact made: Yes, individual(s) contacted:  mother Patient understands diagnosis: Yes. Discussing patient identified problems/goals with staff: Yes. Medical problems stabilized or resolved: Yes. Denies suicidal/homicidal ideation: Yes. Issues/concerns per patient self-inventory: No. Other: None  New problem(s) identified: No, Describe:  None  New Short Term/Long Term Goal(s):medication stabilization, elimination of SI thoughts, development of comprehensive mental wellness plan.   Patient Goals:  "to get better with my back injury."  Discharge Plan or Barriers: Pt has appointments for medication management and therapy at [facility] following discharge.  Pt will go home where her mother will stay with her for a few weeks.   Reason for Continuation of Hospitalization: Medication stabilization  Estimated Length of Stay: 3-5 days   Scribe for Treatment Team: Eliott Nine 12/07/2020 1:53 PM

## 2020-12-07 NOTE — Progress Notes (Signed)
Pt refused Seroquel this evening"that's a little too much tonight"

## 2020-12-07 NOTE — Progress Notes (Signed)
   12/07/20 1100  Psych Admission Type (Psych Patients Only)  Admission Status Involuntary  Psychosocial Assessment  Patient Complaints None  Eye Contact Fair  Facial Expression Animated;Anxious  Affect Appropriate to circumstance  Speech Logical/coherent;Soft  Interaction Assertive  Motor Activity Restless;Slow  Appearance/Hygiene Unremarkable  Behavior Characteristics Cooperative  Mood Pleasant  Thought Process  Coherency Flight of ideas  Content WDL  Delusions None reported or observed  Perception WDL  Hallucination None reported or observed  Judgment Poor  Confusion None  Danger to Self  Current suicidal ideation? Denies  Danger to Others  Danger to Others None reported or observed

## 2020-12-07 NOTE — [facility] Group Notes (Signed)
Patient did not attend group today.

## 2020-12-08 MED ORDER — RISPERIDONE 1 MG PO TBDP
1.0000 mg | ORAL_TABLET | Freq: Every day | ORAL | Status: DC
  Administered 2020-12-08 – 2020-12-15 (×8): 1 mg via ORAL
  Filled 2020-12-08 (×11): qty 1

## 2020-12-08 MED ORDER — RISPERIDONE 1 MG PO TBDP
1.0000 mg | ORAL_TABLET | Freq: Every day | ORAL | Status: DC
  Administered 2020-12-08 – 2020-12-12 (×5): 1 mg via ORAL
  Filled 2020-12-08 (×8): qty 1

## 2020-12-08 NOTE — Progress Notes (Signed)
   12/08/20 1600  Psych Admission Type (Psych Patients Only)  Admission Status Involuntary  Psychosocial Assessment  Patient Complaints None  Eye Contact Fair  Facial Expression Animated;Anxious  Affect Appropriate to circumstance  Speech Logical/coherent;Soft  Interaction Assertive  Motor Activity Restless;Slow  Appearance/Hygiene Unremarkable  Behavior Characteristics Cooperative  Mood Pleasant  Aggressive Behavior  Effect No apparent injury  Thought Horticulturist, commercial of ideas  Content WDL  Delusions None reported or observed  Perception WDL  Hallucination None reported or observed  Judgment Poor  Confusion None  Danger to Self  Current suicidal ideation? Denies  Danger to Others  Danger to Others None reported or observed

## 2020-12-08 NOTE — Progress Notes (Signed)
Pt very needy and paranoid on the unit this evening.     12/08/20 0000  Psych Admission Type (Psych Patients Only)  Admission Status Involuntary  Psychosocial Assessment  Patient Complaints None  Eye Contact Fair  Facial Expression Animated;Anxious  Affect Appropriate to circumstance  Speech Logical/coherent;Soft  Interaction Assertive  Motor Activity Restless;Slow  Appearance/Hygiene Unremarkable  Behavior Characteristics Cooperative  Mood Pleasant  Thought Process  Coherency Flight of ideas  Content WDL  Delusions None reported or observed  Perception WDL  Hallucination None reported or observed  Judgment Poor  Confusion None  Danger to Self  Current suicidal ideation? Denies  Danger to Others  Danger to Others None reported or observed

## 2020-12-08 NOTE — Progress Notes (Addendum)
[facility] MD Progress Note  12/08/2020 3:32 PM Veronica Perez  MRN:  [id number]  Subjective:   Patient is a 45 year old female with history of bipolar affective disorder presenting involuntarily from [place] long ED due to worsening anxiety, paranoia, and self-neglect, physical aggression and HI.  [facility] Day 12    Overnight Events: No acute events, no agitation PRNs, patient did attend groups. She refused her PM Seroquel.  Patient slept Number of Hours: 5  CIWA-Ar Total: 1   Patient was evaluated this AM.  When asked what her mood is she states "I cannot sleep".  She attributes this to there being nothing to do.  She she repeats things frequently such as "I got tired of my stuff falling on the floor so I put it in bags on my couch ".  She is again concerned about her left eye.  She reports sensitivity to light and heat.  When asked about her refusal of Seroquel, she reports that she does well with respite all and does not wish to have Seroquel.  She denies suicidal thoughts and auditory/visual hallucinations.  Principal Problem: Bipolar I disorder, current or most recent episode manic, with psychotic features ([place]) Diagnosis:  Principal Problem:   Bipolar I disorder, current or most recent episode manic, with psychotic features ([place]) Active Problems:   PTSD (post-traumatic stress disorder)   Catatonia  Total Time spent with patient: 30 minutes  Past Psychiatric History: see H&P  Past Medical History:  Past Medical History:  Diagnosis Date   Bipolar disorder ([place])    Depression    Former smoker, stopped smoking many years ago    HSV (herpes simplex virus) infection    Hypothyroid    Low TSH level 05/30/2016   Panic attacks    Thyroid disease     Past Surgical History:  Procedure Laterality Date   ADENOIDECTOMY     OVARIAN CYST SURGERY     TONSILLECTOMY     Family History:  Family History  Problem Relation Age of Onset   Diabetes Father    Heart disease Father    Cancer  Maternal Grandmother        breast cancer   Bipolar disorder Mother    Thyroid disease Neg Hx    Family Psychiatric  History: patient states mom and dad's side have depression  Social History:  Social History   Substance and Sexual Activity  Alcohol Use Not Currently   Comment: denies     Social History   Substance and Sexual Activity  Drug Use No    Social History   Socioeconomic History   Marital status: Single    Spouse name: Not on file   Number of children: Not on file   Years of education: Not on file   Highest education level: High school graduate  Occupational History   Not on file  Tobacco Use   Smoking status: Every Day    Packs/day: 25.00    Types: Cigarettes   Smokeless tobacco: Never  Vaping Use   Vaping Use: Never used  Substance and Sexual Activity   Alcohol use: Not Currently    Comment: denies   Drug use: No   Sexual activity: Yes    Birth control/protection: None  Other Topics Concern   Not on file  Social History Narrative   Not on file   Social Determinants of Health   Financial Resource Strain: Not on file  Food Insecurity: Not on file  Transportation Needs: Not on file  Physical Activity: Not on file  Stress: Not on file  Social Connections: Not on file   Sleep: Good  Appetite:  Good  Current Medications: Current Facility-Administered Medications  Medication Dose Route Frequency Provider Last Rate Last Admin   acetaminophen (TYLENOL) tablet 650 mg  650 mg Oral Q6H PRN Suella Broad, FNP   650 mg at 12/07/20 2148   alum & mag hydroxide-simeth (MAALOX/MYLANTA) 200-200-20 MG/5ML suspension 30 mL  30 mL Oral Q4H PRN Starkes-Perry, Gayland Curry, FNP       benztropine (COGENTIN) tablet 0.5 mg  0.5 mg Oral BID PRN Harlow Asa, MD       docusate sodium (COLACE) capsule 100 mg  100 mg Oral Daily France Ravens, MD   100 mg at 12/08/20 1027   lidocaine (LIDODERM) 5 % 1 patch  1 patch Transdermal Daily Merrily Brittle, DO   1 patch at  12/07/20 0820   LORazepam (ATIVAN) injection 1 mg  1 mg Intramuscular Q6H PRN Harlow Asa, MD       OLANZapine zydis (ZYPREXA) disintegrating tablet 10 mg  10 mg Oral Q8H PRN France Ravens, MD       And   LORazepam (ATIVAN) tablet 1 mg  1 mg Oral PRN France Ravens, MD       And   ziprasidone (GEODON) injection 20 mg  20 mg Intramuscular PRN France Ravens, MD       magnesium hydroxide (MILK OF MAGNESIA) suspension 30 mL  30 mL Oral Daily PRN Suella Broad, FNP   30 mL at 12/02/20 1513   melatonin tablet 3 mg  3 mg Oral QPC supper Merrily Brittle, DO   3 mg at 12/07/20 2148   propranolol (INDERAL) tablet 10 mg  10 mg Oral TID Harlow Asa, MD   10 mg at 12/08/20 1455   risperiDONE (RISPERDAL M-TABS) disintegrating tablet 1 mg  1 mg Oral Daily Reka Wist, Jackie Plum, MD   1 mg at 12/08/20 1026   risperiDONE (RISPERDAL M-TABS) disintegrating tablet 1 mg  1 mg Oral QHS Tashi Andujo, Jackie Plum, MD       topiramate ER (QUDEXY XR) capsule 200 mg  200 mg Oral QHS Merrily Brittle, DO   200 mg at 12/07/20 2148    Lab Results:  No results found for this or any previous visit (from the past 36 hour(s)).   Blood Alcohol level:  Lab Results  Component Value Date   ETH <10 11/25/2020   ETH <10 53/97/6734    Metabolic Disorder Labs: Lab Results  Component Value Date   HGBA1C 5.0 11/28/2020   MPG 96.8 11/28/2020   MPG 96.8 06/28/2019   Lab Results  Component Value Date   PROLACTIN 13.1 06/28/2019   PROLACTIN 69.3 (H) 09/17/2016   Lab Results  Component Value Date   CHOL 141 11/28/2020   TRIG 214 (H) 11/28/2020   HDL 40 (L) 11/28/2020   CHOLHDL 3.5 11/28/2020   VLDL 43 (H) 11/28/2020   LDLCALC 58 11/28/2020   LDLCALC 103 (H) 06/28/2019    Physical Findings:  Musculoskeletal: Strength & Muscle Tone: within normal limits Gait & Station: ambulating without a brace, steadily, no gait abnormalities Patient leans: N/A  Psychiatric Specialty Exam:  Presentation  General Appearance:  Hair washed, in high ponytail, no make-up  Eye Contact: Good  Speech: Clear and coherent, intermittent stuttering and delay in speech, nonpressured  Speech Volume:Normal  Handedness:Right   Mood and Affect  Mood: Guarded, childlike at times,  anxious  Affect: Labile, congruent, teary, upset, angry   Thought Process  Thought Processes: More coherent and linear, superficially goal directed  Orientation:Full (Time, Place and Person)  Thought Content: Denies AVH, ideas of reference, or first rank symptoms; is not grossly responding to internal/external stimuli on exam.  Perseverate on her eye, as she does not like this new hall, and how no one should abandon or ignore their children.  History of Schizophrenia/Schizoaffective disorder: Questionable diagnosis of schizoaffective per daughter on admission  Duration of Psychotic Symptoms:Greater than six months  Hallucinations:Denied  Ideas of Reference:None  Suicidal Thoughts:Denied  Homicidal Thoughts:Denied   Sensorium  Memory: Fair  Judgment: Poor  Insight: Lacking   Executive Functions  Concentration: Good  Attention Span: Good  Recall: [place]   Psychomotor Activity  Psychomotor Activity: Still restless and fidgety on exam, but only when she gets anxious  Assets  Assets:Communication Skills; Desire for Improvement; Housing; Social Support  Sleep  Patient slept Number of Hours: 5   Physical Exam Vitals and nursing note reviewed.  Constitutional:      Appearance: Normal appearance. She is normal weight.  HENT:     Head: Normocephalic and atraumatic.  Pulmonary:     Effort: Pulmonary effort is normal.  Musculoskeletal:        General: No swelling or tenderness. Normal range of motion.     Comments: No midline spinal tenderness, no MSK tenderness.  Neurological:     Mental Status: She is oriented to person, place, and time.     Motor: No weakness.      Coordination: Coordination normal.     Gait: Gait normal.   Review of Systems  Respiratory:  Negative for shortness of breath.   Cardiovascular:  Negative for chest pain.  Gastrointestinal:  Negative for constipation, diarrhea, nausea and vomiting.  Blood pressure 116/68, pulse 84, temperature 97.6 F (36.4 C), temperature source Oral, resp. rate 16, height 5\' 3"  (1.6 m), weight 76.2 kg, SpO2 98 %. Body mass index is 29.76 kg/m.  Treatment Plan Summary: Daily contact with patient to assess and evaluate symptoms and progress in treatment and Medication management  ASSESSMENT Patient is a 45 year old female with history of bipolar affective disorder presenting involuntarily from [place] long ED due to worsening anxiety, paranoia,and poor self-care.  On admission patient had signs of catatonia that improved with ativan challenge. Her catatonia has resolved but she has residual disorganized thinking, delusions, and irritability. There is question as to whether she has a schizoaffective d/o bipolar type diagnosis.  Plan to continue tapering Ativan as catatonia has improved.     PLAN #Bipolar I manic with psychotic features (r/o schizoaffective d/o bipolar type) Patient still has residual delusions and disorganized thinking, we will continue SGA, and titrate up as needed. Also presented with restlessness and possible akathisia, unclear etiology, could be 2/2 possible hyperthyroidism.  Risperdal has high risk of akathisia, we will discontinue to see if her restlessness has improved.  We will also change her Topamax IR to ER, to simplify her medical regimen.  Patient's mood is still labile, we will start her on Seroquel 150 mg nightly. Continue agitation protocol PRN Continue Cogentin 0.5mg  bid PRN for EPS Continue topiramate ER 200 mg nightly Continue propranolol 10 mg tid daily  Switch Seroquel to Risperdal 1 mg BID  #Catatonia, resolved Patient was catatonic on admission, which has resolved for  now.  Ativan taper completed Continue CIWA  Multinodular goiter/hyperthyroidism: Unsure if symptomatic  considering other involving factors. TSH<0.01, Free T4 1.35. Will have patient follow-up for outpatient workup. Currently refusing methimazole due to unspecified side effect. Patient continues to refuse medication for hyperthyroidism  Continue Inderal 10mg  TID   59mm right adrenal adenoma noted on CT scan 10/19/20 Will need PCP/endocrine f/u after discharge     3. Safety and Monitoring: Involuntary admission to inpatient psychiatric unit for safety, stabilization and treatment Daily contact with patient to assess and evaluate symptoms and progress in treatment Patient's case to be discussed in multi-disciplinary team meeting Observation Level : q15 minute checks Vital signs: q12 hours Precautions: suicide, elopement, and assault   4. Discharge Planning: Social work and case management to assist with discharge planning and identification of hospital follow-up needs prior to discharge Discharge Concerns: Need to establish a safety plan; Medication compliance and effectiveness Discharge Goals: Return home with outpatient referrals for mental health follow-up including medication management/psychotherapy  Corky Sox, MD

## 2020-12-08 NOTE — Progress Notes (Signed)
Adult Psychoeducational Group Note  Date:  12/08/2020 Time:  11:03 PM  Group Topic/Focus:  Wrap-Up Group:   The focus of this group is to help patients review their daily goal of treatment and discuss progress on daily workbooks.  Pt did not attend

## 2020-12-08 NOTE — [facility] Group Notes (Addendum)
Psychoeducational Group Note    Date:12/08/2020 Time: 1300-1400    Purpose of Group: . The group focus' on teaching patients on how to identify their needs and how Life Skills:  A group where two lists are made. What people need and what are things that we do that are healthy. The lists are developed by the patients and it is explained that we often do the actions that are not healthy to get our list of needs met.  to develop the coping skills needed to get their needs met  Participation Level:  Did not attend  Lenae Wherley A  

## 2020-12-08 NOTE — Progress Notes (Signed)
Pt complained of her eye draining and not being able to open her eye and needing a specialist. Pt was observed opening her eye when she was observed without her knowledge and no evidence of drainage at this time . Pt was given saline .

## 2020-12-09 MED ORDER — WHITE PETROLATUM EX OINT
TOPICAL_OINTMENT | CUTANEOUS | Status: AC
  Filled 2020-12-09: qty 5

## 2020-12-09 NOTE — Group Note (Signed)
LCSW Group Therapy Note   Group Date: 12/09/2020 Start Time: 1000 End Time: 1030   Type of Therapy and Topic:  Group Therapy: Boundaries  Participation Level:  Active  Description of Group: This group will address the use of boundaries in their personal lives. Patients will explore why boundaries are important, the difference between healthy and unhealthy boundaries, and negative and postive outcomes of different boundaries and will look at how boundaries can be crossed.  Patients will be encouraged to identify current boundaries in their own lives and identify what kind of boundary is being set. Facilitators will guide patients in utilizing problem-solving interventions to address and correct types boundaries being used and to address when no boundary is being used. Understanding and applying boundaries will be explored and addressed for obtaining and maintaining a balanced life. Patients will be encouraged to explore ways to assertively make their boundaries and needs known to significant others in their lives, using other group members and facilitator for role play, support, and feedback.  Therapeutic Goals:  1.  Patient will identify areas in their life where setting clear boundaries could be  used to improve their life.  2.  Patient will identify signs/triggers that a boundary is not being respected. 3.  Patient will identify two ways to set boundaries in order to achieve balance in  their lives: 4.  Patient will demonstrate ability to communicate their needs and set boundaries  through discussion and/or role plays  Summary of Patient Progress:  Due to the acuity and low staffing, group was not held on the adult unit today. Patients were provided therapeutic worksheets and asked to meet with CSW as needed.  Therapeutic Modalities:   Cognitive Behavioral Therapy Solution-Focused Therapy  Eliott Nine 12/09/2020  10:24 AM

## 2020-12-09 NOTE — Progress Notes (Signed)
Pt continues to be very attention seeking and needy on the unit, but pleasant. Pt has to be instructed fully on what she needs to do on the unit for her to appear to function appropriately. Pt continues to question her medication and when educated on her meds pt will cry and be very apprehensive"I'm not taking any new drugs" pt has to be encouraged to take certain medications scheduled .    12/09/20 0000  Psych Admission Type (Psych Patients Only)  Admission Status Involuntary  Psychosocial Assessment  Patient Complaints Worrying;Restlessness  Eye Contact Fair  Facial Expression Animated;Anxious  Affect Appropriate to circumstance  Speech Logical/coherent;Soft  Interaction Assertive;Childlike;Attention-seeking  Motor Activity Restless;Slow  Appearance/Hygiene Unremarkable  Behavior Characteristics Cooperative;Restless  Mood Pleasant  Aggressive Behavior  Effect No apparent injury  Thought Horticulturist, commercial of ideas  Content WDL  Delusions None reported or observed  Perception WDL  Hallucination None reported or observed  Judgment Poor  Confusion None  Danger to Self  Current suicidal ideation? Denies  Danger to Others  Danger to Others None reported or observed

## 2020-12-09 NOTE — [facility] Group Notes (Signed)
Adult Psychoeducational Group Not Date:  12/09/2020 Time:  0900-1045 Group Topic/Focus: PROGRESSIVE RELAXATION. A group where deep breathing is taught and tensing and relaxation muscle groups is used. Imagery is used as well.  Pts are asked to imagine 3 pillars that hold them up when they are not able to hold themselves up.  Participation Level:  pt invited to the group, but did not attend Veronica Perez

## 2020-12-09 NOTE — Progress Notes (Addendum)
   12/09/20 1200  Psych Admission Type (Psych Patients Only)  Admission Status Involuntary  Psychosocial Assessment  Patient Complaints Worrying;Restlessness  Eye Contact Fair  Facial Expression Animated;Anxious  Affect Appropriate to circumstance  Speech Logical/coherent;Soft  Interaction Assertive;Childlike;Attention-seeking  Motor Activity Restless;Slow  Appearance/Hygiene Unremarkable  Behavior Characteristics Cooperative;Restless  Mood Pleasant;Labile  Aggressive Behavior  Effect No apparent injury  Thought Process  Coherency Disorganized  Content WDL  Delusions None reported or observed  Perception WDL  Hallucination None reported or observed  Judgment Poor  Confusion None  Danger to Self  Current suicidal ideation? Denies  Danger to Others  Danger to Others None reported or observed

## 2020-12-09 NOTE — Progress Notes (Signed)
Pt refused her Topirmate tonight , pt stated "It keeps me up, I can't take it" pt continues to be childlike, has no insight into Tx . Writer tried to educate pt on medication, but pt would not listen to Probation officer, so pt was encouraged to talk to the doctor

## 2020-12-09 NOTE — Progress Notes (Signed)
[facility] MD Progress Note  12/09/2020 5:56 PM Veronica Perez  MRN:  [id number]  Subjective:   Patient is a 45 year old female with history of bipolar affective disorder presenting involuntarily from [place] long ED due to worsening anxiety, paranoia, and self-neglect, physical aggression and HI.  [facility] Day 13    Overnight Events: No acute events, no agitation PRNs.  Patient slept Number of Hours: 6.3  CIWA-Ar Total: 1   Patient was evaluated this AM, initially seen at the RN station asking for water. Patient stated that she does not like her current room outside of 500 hall. Patient complained that she is not sleeping well because of the topamax, although she has been taking it for multiple days now.  Patient stated that she is getting anxious, and she really wants to go home.  Patient perseverates that the only medicine that works for her, is the Risperdal, and that she hates the Seroquel.  She continues to state that she will not take the Topamax, because it makes her feel terrible, even though she has been taking it for days now with no complaints about it.  Patient's speech is still tangential, and jumping from topic to topic.Marland Kitchen  She then continued to talk about how she is learning how to chill, and that her mouth is dry. Otherwise she denied SI/HI/AVH, delusions, paranoia, worsening symptoms.  Principal Problem: Bipolar I disorder, current or most recent episode manic, with psychotic features ([name]) Diagnosis:  Principal Problem:   Bipolar I disorder, current or most recent episode manic, with psychotic features ([name]) Active Problems:   PTSD (post-traumatic stress disorder)   Catatonia  Total Time spent with patient: 30 minutes  Past Psychiatric History: see H&P  Past Medical History:  Past Medical History:  Diagnosis Date   Bipolar disorder ([name])    Depression    Former smoker, stopped smoking many years ago    HSV (herpes simplex virus) infection    Hypothyroid    Low TSH level  05/30/2016   Panic attacks    Thyroid disease     Past Surgical History:  Procedure Laterality Date   ADENOIDECTOMY     OVARIAN CYST SURGERY     TONSILLECTOMY     Family History:  Family History  Problem Relation Age of Onset   Diabetes Father    Heart disease Father    Cancer Maternal Grandmother        breast cancer   Bipolar disorder Mother    Thyroid disease Neg Hx    Family Psychiatric  History: patient states mom and dad's side have depression  Social History:  Social History   Substance and Sexual Activity  Alcohol Use Not Currently   Comment: denies     Social History   Substance and Sexual Activity  Drug Use No    Social History   Socioeconomic History   Marital status: Single    Spouse name: Not on file   Number of children: Not on file   Years of education: Not on file   Highest education level: High school graduate  Occupational History   Not on file  Tobacco Use   Smoking status: Every Day    Packs/day: 25.00    Types: Cigarettes   Smokeless tobacco: Never  Vaping Use   Vaping Use: Never used  Substance and Sexual Activity   Alcohol use: Not Currently    Comment: denies   Drug use: No   Sexual activity: Yes    Birth control/protection:  None  Other Topics Concern   Not on file  Social History Narrative   Not on file   Social Determinants of Health   Financial Resource Strain: Not on file  Food Insecurity: Not on file  Transportation Needs: Not on file  Physical Activity: Not on file  Stress: Not on file  Social Connections: Not on file   Sleep: Good  Appetite:  Good  Current Medications: Current Facility-Administered Medications  Medication Dose Route Frequency Provider Last Rate Last Admin   acetaminophen (TYLENOL) tablet 650 mg  650 mg Oral Q6H PRN Suella Broad, FNP   650 mg at 12/07/20 2148   alum & mag hydroxide-simeth (MAALOX/MYLANTA) 200-200-20 MG/5ML suspension 30 mL  30 mL Oral Q4H PRN Starkes-Perry, Gayland Curry,  FNP       benztropine (COGENTIN) tablet 0.5 mg  0.5 mg Oral BID PRN Nelda Marseille, Amy E, MD       docusate sodium (COLACE) capsule 100 mg  100 mg Oral Daily France Ravens, MD   100 mg at 12/09/20 0839   lidocaine (LIDODERM) 5 % 1 patch  1 patch Transdermal Daily Merrily Brittle, DO   1 patch at 12/07/20 0820   LORazepam (ATIVAN) injection 1 mg  1 mg Intramuscular Q6H PRN Harlow Asa, MD       OLANZapine zydis (ZYPREXA) disintegrating tablet 10 mg  10 mg Oral Q8H PRN France Ravens, MD       And   LORazepam (ATIVAN) tablet 1 mg  1 mg Oral PRN France Ravens, MD       And   ziprasidone (GEODON) injection 20 mg  20 mg Intramuscular PRN France Ravens, MD       magnesium hydroxide (MILK OF MAGNESIA) suspension 30 mL  30 mL Oral Daily PRN Suella Broad, FNP   30 mL at 12/09/20 0841   melatonin tablet 3 mg  3 mg Oral QPC supper Merrily Brittle, DO   3 mg at 12/09/20 1746   propranolol (INDERAL) tablet 10 mg  10 mg Oral TID Harlow Asa, MD   10 mg at 12/09/20 1745   risperiDONE (RISPERDAL M-TABS) disintegrating tablet 1 mg  1 mg Oral Daily Hill, Jackie Plum, MD   1 mg at 12/09/20 0840   risperiDONE (RISPERDAL M-TABS) disintegrating tablet 1 mg  1 mg Oral QHS Hill, Jackie Plum, MD   1 mg at 12/08/20 2047   topiramate ER (QUDEXY XR) capsule 200 mg  200 mg Oral QHS Merrily Brittle, DO   200 mg at 12/08/20 2047    Lab Results:  No results found for this or any previous visit (from the past 48 hour(s)).   Blood Alcohol level:  Lab Results  Component Value Date   ETH <10 11/25/2020   ETH <10 78/24/2353    Metabolic Disorder Labs: Lab Results  Component Value Date   HGBA1C 5.0 11/28/2020   MPG 96.8 11/28/2020   MPG 96.8 06/28/2019   Lab Results  Component Value Date   PROLACTIN 13.1 06/28/2019   PROLACTIN 69.3 (H) 09/17/2016   Lab Results  Component Value Date   CHOL 141 11/28/2020   TRIG 214 (H) 11/28/2020   HDL 40 (L) 11/28/2020   CHOLHDL 3.5 11/28/2020   VLDL 43 (H) 11/28/2020    LDLCALC 58 11/28/2020   LDLCALC 103 (H) 06/28/2019    Physical Findings:  Musculoskeletal: Strength & Muscle Tone: within normal limits Gait & Station: ambulating without a brace, steadily, no gait abnormalities Patient leans:  N/A  Psychiatric Specialty Exam:  Presentation  General Appearance: Hair washed, casual, appropriately groomed, heavy make-up  Eye Contact: Good  Speech: Clear and coherent, intermittent stuttering and delay in speech, nonpressured, she will repeat the same phrase multiple times before moving on to the next topic  Speech Volume:Normal  Handedness:Right   Mood and Affect  Mood: Guarded, childlike at times, anxious  Affect: Labile, congruent, dysphoric   Thought Process  Thought Processes: More coherent and linear, superficially goal directed  Orientation:Full (Time, Place and Person)  Thought Content: Denies AVH, ideas of reference, or first rank symptoms; is not grossly responding to internal/external stimuli on exam.   History of Schizophrenia/Schizoaffective disorder: Questionable diagnosis of schizoaffective per daughter on admission  Duration of Psychotic Symptoms:Greater than six months  Hallucinations: Denied  Ideas of Reference: None  Suicidal Thoughts: Denied  Homicidal Thoughts: Denied   Sensorium  Memory: Fair  Judgment: Poor  Insight: Lacking   Community education officer  Concentration: Fair  Attention Span: Fair  Recall: [employer] of Knowledge: Fair  Language: Fair   Psychomotor Activity  Psychomotor Activity: Still restless and fidgety on exam, but only when she gets anxious  Assets  Assets:Communication Skills; Desire for Improvement; Housing; Social Support  Sleep  Patient slept Number of Hours: 6.3   Physical Exam Vitals and nursing note reviewed.  Constitutional:      Appearance: Normal appearance. She is normal weight.  HENT:     Head: Normocephalic and atraumatic.  Pulmonary:     Effort:  Pulmonary effort is normal.  Musculoskeletal:        General: No swelling or tenderness. Normal range of motion.     Comments: No midline spinal tenderness, no MSK tenderness.  Neurological:     Mental Status: She is oriented to person, place, and time.     Motor: No weakness.     Coordination: Coordination normal.     Gait: Gait normal.   Review of Systems  Respiratory:  Negative for shortness of breath.   Cardiovascular:  Negative for chest pain.  Gastrointestinal:  Negative for constipation, diarrhea, nausea and vomiting.  Blood pressure 129/78, pulse 68, temperature 97.6 F (36.4 C), temperature source Oral, resp. rate 16, height 5\' 3"  (1.6 m), weight 76.2 kg, SpO2 99 %. Body mass index is 29.76 kg/m.  Treatment Plan Summary: Daily contact with patient to assess and evaluate symptoms and progress in treatment and Medication management  ASSESSMENT Patient is a 45 year old female with history of bipolar affective disorder presenting involuntarily from [place] long ED due to worsening anxiety, paranoia,and poor self-care.  On admission patient had signs of catatonia that improved with ativan challenge. Her catatonia has resolved but she has residual disorganized thinking, delusions, and irritability. There is question as to whether she has a schizoaffective d/o bipolar type diagnosis.  Plan to continue tapering Ativan as catatonia has improved.     PLAN #Bipolar I manic with psychotic features (r/o schizoaffective d/o bipolar type) Patient still has residual delusions and disorganized thinking, we will continue SGA, and titrate up as needed. Also presented with restlessness and possible akathisia, unclear etiology, could be 2/2 possible hyperthyroidism. Risperdal has high risk of akathisia, we will discontinue to see if her restlessness has improved.  Continue agitation protocol PRN Continue Cogentin 0.5mg  bid PRN for EPS Continue topiramate ER 200 mg nightly Continue propranolol 10 mg  tid daily  Continue Risperdal 1 mg BID  #Catatonia, resolved Patient was catatonic on admission, which has resolved  for now.  Ativan taper completed Continue CIWA  Multinodular goiter/hyperthyroidism: Unsure if symptomatic considering other involving factors. TSH<0.01, Free T4 1.35. Will have patient follow-up for outpatient workup. Currently refusing methimazole due to unspecified side effect. Patient continues to refuse medication for hyperthyroidism  Continue Inderal 10mg  TID   67mm right adrenal adenoma noted on CT scan 10/19/20 Will need PCP/endocrine f/u after discharge     3. Safety and Monitoring: Involuntary admission to inpatient psychiatric unit for safety, stabilization and treatment Daily contact with patient to assess and evaluate symptoms and progress in treatment Patient's case to be discussed in multi-disciplinary team meeting Observation Level : q15 minute checks Vital signs: q12 hours Precautions: suicide, elopement, and assault   4. Discharge Planning: Social work and case management to assist with discharge planning and identification of hospital follow-up needs prior to discharge Discharge Concerns: Need to establish a safety plan; Medication compliance and effectiveness Discharge Goals: Return home with outpatient referrals for mental health follow-up including medication management/psychotherapy  Merrily Brittle, DO Psychiatry Resident, PGY-1 [facility] 12/09/2020, 5:56 PM

## 2020-12-10 ENCOUNTER — Encounter (HOSPITAL_COMMUNITY): Payer: Self-pay | Admitting: Family

## 2020-12-10 NOTE — BHH Group Notes (Signed)
Pt did not attend evening group.

## 2020-12-10 NOTE — Group Note (Signed)
Recreation Therapy Group Note   Group Topic:Stress Management  Group Date: 12/10/2020 Start Time: 0940 End Time: 0952 Facilitators: Victorino Sparrow, LRT/CTRS Location: [address] Dayroom   Goal Area(s) Addresses:  Patient will actively participate in stress management techniques presented during session.  Patient will successfully identify benefit of practicing stress management post d/c.   Group Description: Meditation. LRT played a meditation that focused on being resilient in the face of adversity.  Patients were to listen and follow along as meditation played to engage in group. Patients were given suggestions of ways to access scripts post d/c and encouraged to explore Youtube and other apps available on smartphones, tablets, and computers.   Affect/Mood: N/A   Participation Level: Did not attend    Clinical Observations/Individualized Feedback: Pt did not attend group.    Plan: Continue to engage patient in RT group sessions 2-3x/week.   Victorino Sparrow, LRT/CTRS 12/10/2020 12:31 PM

## 2020-12-10 NOTE — Group Note (Signed)
Occupational Therapy Group Note  Group Topic:Self-Esteem  Group Date: 12/10/2020 Start Time: 1400 End Time: 1435 Facilitators: Ponciano Ort, OT/L   Group Description: Group encouraged increased engagement and participation through discussion and activity focused on self-esteem. Patients explored and discussed the differences between healthy and low self-esteem and how it affects our daily lives and occupations with a focus on relationships, work, school, self-care, and personal leisure interests. Group discussion then transitioned into identifying specific strategies to boost self-esteem and engaged in a collaborative and independent activity looking at positive ways to describe oneself A-Z.   Therapeutic Goal(s): Understand and recognize the differences between healthy and low self-esteem Identify healthy strategies to improve/build self-esteem    Participation Level: Active   Participation Quality: Independent   Behavior: Calm, Cooperative, and Interactive    Speech/Thought Process: Focused   Affect/Mood: Euthymic   Insight: Fair   Judgement: Fair   Individualization: Lujean was active in their participation of group discussion/activity. Pt identified engaging in "working on cars" and "riding dirt bikes" as activities that boost his self-esteem. Appeared receptive to education and discussion.   Modes of Intervention: Activity, Discussion, and Education  Patient Response to Interventions:  Attentive, Engaged, and Interested    Plan: Continue to engage patient in OT groups 2 - 3x/week.  12/10/2020  Ponciano Ort, OT/L

## 2020-12-10 NOTE — Plan of Care (Signed)
I formerly took care of this patient during recent medical hospitalization for seizurelike activity.  Today, patient's fiancee inappropriately contacted me on my personal cell phone, which I had never provided to him - he found it through [place]. I informed him that this was inappropriate behavior and instructed him to direct all future requests to her current treatment team; he apologized and I let him know I would relay his request to her inpatient physicians.

## 2020-12-10 NOTE — BHH Group Notes (Signed)
[place] Group Notes:  (Nursing/MHT/Case Management/Adjunct)  Date:  12/10/2020  Time:  10:51 PM  Type of Therapy:  Psychoeducational Skills  Participation Level:  Minimal  Participation Quality:  Attentive  Affect:  Blunted  Cognitive:  Appropriate  Insight:  Limited  Engagement in Group:  Developing/Improving  Modes of Intervention:  Education  Summary of Progress/Problems: The patient attended the evening A.A.meeting and was appropriate.   Archie Balboa S 12/10/2020, 10:51 PM

## 2020-12-10 NOTE — BHH Group Notes (Signed)
Spiritual care group on grief and loss facilitated by chaplain Janne Napoleon, [facility]   Group Goal:   Support / Education around grief and loss   Members engage in facilitated group support and psycho-social education.   Group Description:   Following introductions and group rules, group members engaged in facilitated group dialog and support around topic of loss, with particular support around experiences of loss in their lives. Group Identified types of loss (relationships / self / things) and identified patterns, circumstances, and changes that precipitate losses. Reflected on thoughts / feelings around loss, normalized grief responses, and recognized variety in grief experience. Group noted Worden's four tasks of grief in discussion.   Group drew on Adlerian / Rogerian, narrative, MI,   Patient Progress: Veronica Perez attended group and was engaged in conversation.  Her comments were sometimes tangential, but she was redirected easily.  [place], Bcc Pager, [phone] 9:56 PM

## 2020-12-10 NOTE — Progress Notes (Signed)
   12/10/20 1100  Psych Admission Type (Psych Patients Only)  Admission Status Involuntary  Psychosocial Assessment  Patient Complaints Anxiety  Eye Contact Fair  Facial Expression Animated;Anxious  Affect Appropriate to circumstance  Speech Logical/coherent;Soft  Interaction Assertive;Childlike;Attention-seeking  Motor Activity Restless;Slow  Appearance/Hygiene Unremarkable  Behavior Characteristics Cooperative  Mood Anxious  Aggressive Behavior  Effect No apparent injury  Thought Process  Coherency Disorganized  Content WDL  Delusions None reported or observed  Perception WDL  Hallucination None reported or observed  Judgment Poor  Confusion None  Danger to Self  Current suicidal ideation? Denies  Danger to Others  Danger to Others None reported or observed

## 2020-12-10 NOTE — Progress Notes (Signed)
   12/10/20 0200  Psych Admission Type (Psych Patients Only)  Admission Status Involuntary  Psychosocial Assessment  Patient Complaints Anxiety;Worrying  Eye Contact Fair  Facial Expression Animated;Anxious  Affect Appropriate to circumstance  Speech Logical/coherent;Soft  Interaction Assertive;Childlike;Attention-seeking  Motor Activity Restless;Slow  Appearance/Hygiene Unremarkable  Behavior Characteristics Restless;Cooperative  Mood Labile;Anxious  Aggressive Behavior  Effect No apparent injury  Thought Process  Coherency Disorganized  Content WDL  Delusions None reported or observed  Perception WDL  Hallucination None reported or observed  Judgment Poor  Confusion None  Danger to Self  Current suicidal ideation? Denies  Danger to Others  Danger to Others None reported or observed

## 2020-12-10 NOTE — [facility] Group Notes (Signed)
Patient did not attend group today.

## 2020-12-10 NOTE — Progress Notes (Signed)
Pt stated she had a good day, pt continues to be concrete , no insight and childlike.     12/10/20 2000  Psych Admission Type (Psych Patients Only)  Admission Status Involuntary  Psychosocial Assessment  Patient Complaints Anxiety  Eye Contact Fair  Facial Expression Animated;Anxious  Affect Appropriate to circumstance  Speech Logical/coherent;Soft  Interaction Assertive;Childlike;Attention-seeking  Motor Activity Restless;Slow  Appearance/Hygiene Unremarkable  Behavior Characteristics Cooperative  Mood Anxious  Aggressive Behavior  Effect No apparent injury  Thought Process  Coherency Disorganized  Content WDL  Delusions None reported or observed  Perception WDL  Hallucination None reported or observed  Judgment Poor  Confusion None  Danger to Self  Current suicidal ideation? Denies  Danger to Others  Danger to Others None reported or observed

## 2020-12-10 NOTE — Group Note (Signed)
LCSW Group Therapy Note   Group Date: 12/10/2020 Start Time: 1300 End Time: 1400  Group Date: 12/10/2020 Start Time: 1300 End Time: 1400 LCSW Group Therapy Note  Type of Therapy and Topic:  Group Therapy - Healthy vs Unhealthy Coping Skills  Participation Level:  Minimal  Description of Group The focus of this group was to determine what unhealthy coping techniques typically are used by group members and what healthy coping techniques would be helpful in coping with various problems. Patients were guided in becoming aware of the differences between healthy and unhealthy coping techniques. Patients were asked to identify 2-3 healthy coping skills they would like to learn to use more effectively.  Therapeutic Goals 1. Patients learned that coping is what human beings do all day long to deal with various situations in their lives 2. Patients defined and discussed healthy vs unhealthy coping techniques 3. Patients identified their preferred coping techniques and identified whether these were healthy or unhealthy 4. Patients determined 2-3 healthy coping skills they would like to become more familiar with and use more often. 5. Patients provided support and ideas to each other   Summary of Patient Progress: Pt came to group and introduced herself and stated that she enjoys getting waxing done as a form of self care. Pt then left group and did not return.   Therapeutic Modalities Cognitive Behavioral Therapy Motivational Interviewing  Mliss Fritz, Latanya Presser 12/10/2020  1:40 PM
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
## 2020-12-11 DIAGNOSIS — F312 Bipolar disorder, current episode manic severe with psychotic features: Secondary | ICD-10-CM | POA: Diagnosis not present

## 2020-12-11 MED ORDER — WHITE PETROLATUM EX OINT
TOPICAL_OINTMENT | CUTANEOUS | Status: AC
  Filled 2020-12-11: qty 5

## 2020-12-11 NOTE — [facility] Group Notes (Signed)
[place] Group Notes:  (Nursing/MHT/Case Management/Adjunct)  Date:  12/11/2020  Time:  10:52 PM  Type of Therapy:  Psychoeducational Skills  Participation Level:  Active  Participation Quality:  Appropriate  Affect:  Appropriate  Cognitive:  Appropriate  Insight:  Limited  Engagement in Group:  Developing/Improving  Modes of Intervention:  Education  Summary of Progress/Problems: The patient rated her day as a 10 out of 10 since she went outside. She talked about working on going home and missing her kids. SHe then went on a tangent about sugar and how it makes her hyper.   Archie Balboa S 12/11/2020, 10:52 PM

## 2020-12-11 NOTE — [facility] Group Notes (Signed)
[place] Group Notes:  (Nursing/MHT/Case Management/Adjunct)  Date:  12/11/2020  Time:  11:50 AM  Type of Therapy:  Psychoeducational Skills  Participation Level:  Active  Participation Quality:  Appropriate  Affect:  Anxious  Cognitive:  Appropriate  Insight:  Good  Engagement in Group:  Distracting  Modes of Intervention:  Education  Summary of Progress/Problems: Patient was engaged in group, but not always in an appropriate way. Patient mostly anxious  and distracting to the group. Patient was able to name a coping skill to help better regulate her anxiety, as evidenced by saying "I can deep breath."  Jerrye Beavers 12/11/2020, 11:50 AM

## 2020-12-11 NOTE — Progress Notes (Addendum)
[facility] MD Progress Note  12/11/2020 1:05 PM Veronica Perez  MRN:  [id number]  Subjective:  Veronica Perez is a 45 y.o. female  with history of bipolar affective disorder presenting involuntarily from [facility] due to worsening anxiety, paranoia, and self-neglect, physical aggression and HI, then admitted to [facility] for management of mania. [place] Day 15     Overnight Events: No acute events, no agitation PRNs.  Patient slept Number of Hours: 6.75  CIWA-Ar Total: 1   Patient was evaluated this AM in presence of Dr. Nelda Marseille.  Discussed with patient her current hospitalization and diagnoses and when asked if she feels manic, she states "no I just multi-task a lot."  She states she recognizes that she needs to remain on her medications after discharge and is no longer refusing to acknowledge that she has a mental health diagnosis. Informed patient that we will try to schedule a family meeting or contact her parents to discuss her current functioning. Advised that her parents are concerned that if she does not continue to take her medicines outside the hospital to keep her mania under control, they would not be able to take care of her and that she might have to live in a group home. Patient states her goal is to return to live with her parents so she can continue to look after her elderly mother. She continues to refuse any other antipsychotics aside from Risperdal, and she continues to refuse LAI.  Discussed the need to f/u with PCP for adrenal adenoma noted on CT. Also discussed having her follow up with her endocrinologist outpatient for her hyperthyroidism and she agrees to see a PCP who she states can refer her to endocrine if needed. She continues to refuse methimazole, stating that it makes her "feel sick.".Otherwise, patient stated that she slept well because she is not taking the Topamax. She states that her appetite is good, and that her mood is good.She  denied SI/HI/AVH, paranoia, ideas of reference, or first rank symptoms. She continues to believe she was exposed to toxic fumes prior to admission but does not mention this unless prompted to discuss on exam.  Principal Problem: Bipolar I disorder, current or most recent episode manic, with psychotic features ([place]) Diagnosis: Principal Problem:   Bipolar I disorder, current or most recent episode manic, with psychotic features ([place]) Active Problems:   PTSD (post-traumatic stress disorder)   Catatonia  Total Time spent with patient: 30 minutes  Past Psychiatric History: see H&P  Past Medical History:  Past Medical History:  Diagnosis Date   Bipolar disorder ([place])    Depression    Former smoker, stopped smoking many years ago    HSV (herpes simplex virus) infection    Hypothyroid    Low TSH level 05/30/2016   Panic attacks    Thyroid disease     Past Surgical History:  Procedure Laterality Date   ADENOIDECTOMY     OVARIAN CYST SURGERY     TONSILLECTOMY     Family History:  Family History  Problem Relation Age of Onset   Diabetes Father    Heart disease Father    Cancer Maternal Grandmother        breast cancer   Bipolar disorder Mother    Thyroid disease Neg Hx    Family Psychiatric  History: see H&P  Social History:  Social History   Substance and Sexual Activity  Alcohol Use Not Currently   Comment: denies  Social History   Substance and Sexual Activity  Drug Use No    Social History   Socioeconomic History   Marital status: Single    Spouse name: Not on file   Number of children: 1   Years of education: Not on file   Highest education level: High school graduate  Occupational History   Not on file  Tobacco Use   Smoking status: Every Day    Packs/day: 25.00    Types: Cigarettes   Smokeless tobacco: Never  Vaping Use   Vaping Use: Never used  Substance and Sexual Activity   Alcohol use: Not Currently    Comment: denies   Drug use: No    Sexual activity: Yes    Birth control/protection: None  Other Topics Concern   Not on file  Social History Narrative   Not on file   Social Determinants of Health   Financial Resource Strain: Not on file  Food Insecurity: Not on file  Transportation Needs: Not on file  Physical Activity: Not on file  Stress: Not on file  Social Connections: Not on file   Additional Social History:  Patient lives at home with daughter near her parents.  Sleep: Good  Appetite:  Good  Current Medications: Current Facility-Administered Medications  Medication Dose Route Frequency Provider Last Rate Last Admin   acetaminophen (TYLENOL) tablet 650 mg  650 mg Oral Q6H PRN Suella Broad, FNP   650 mg at 12/07/20 2148   alum & mag hydroxide-simeth (MAALOX/MYLANTA) 200-200-20 MG/5ML suspension 30 mL  30 mL Oral Q4H PRN Suella Broad, FNP       benztropine (COGENTIN) tablet 0.5 mg  0.5 mg Oral BID PRN Viann Fish E, MD   0.5 mg at 12/09/20 2158   docusate sodium (COLACE) capsule 100 mg  100 mg Oral Daily France Ravens, MD   100 mg at 12/11/20 0809   LORazepam (ATIVAN) injection 1 mg  1 mg Intramuscular Q6H PRN Harlow Asa, MD       OLANZapine zydis (ZYPREXA) disintegrating tablet 10 mg  10 mg Oral Q8H PRN France Ravens, MD       And   LORazepam (ATIVAN) tablet 1 mg  1 mg Oral PRN France Ravens, MD       And   ziprasidone (GEODON) injection 20 mg  20 mg Intramuscular PRN France Ravens, MD       magnesium hydroxide (MILK OF MAGNESIA) suspension 30 mL  30 mL Oral Daily PRN Suella Broad, FNP   30 mL at 12/11/20 0811   melatonin tablet 3 mg  3 mg Oral QPC supper Merrily Brittle, DO   3 mg at 12/10/20 2151   propranolol (INDERAL) tablet 10 mg  10 mg Oral TID Harlow Asa, MD   10 mg at 12/11/20 1139   risperiDONE (RISPERDAL M-TABS) disintegrating tablet 1 mg  1 mg Oral Daily Hill, Jackie Plum, MD   1 mg at 12/11/20 0809   risperiDONE (RISPERDAL M-TABS) disintegrating tablet 1 mg   1 mg Oral QHS Hill, Jackie Plum, MD   1 mg at 12/10/20 2150    Lab Results: No results found for this or any previous visit (from the past 48 hour(s)).  Blood Alcohol level:  Lab Results  Component Value Date   [facility] <10 11/25/2020   ETH <10 62/94/7654    Metabolic Disorder Labs: Lab Results  Component Value Date   HGBA1C 5.0 11/28/2020   MPG 96.8 11/28/2020  MPG 96.8 06/28/2019   Lab Results  Component Value Date   PROLACTIN 13.1 06/28/2019   PROLACTIN 69.3 (H) 09/17/2016   Lab Results  Component Value Date   CHOL 141 11/28/2020   TRIG 214 (H) 11/28/2020   HDL 40 (L) 11/28/2020   CHOLHDL 3.5 11/28/2020   VLDL 43 (H) 11/28/2020   LDLCALC 58 11/28/2020   LDLCALC 103 (H) 06/28/2019    Physical Findings: AIMS: Facial and Oral Movements Muscles of Facial Expression: None, normal Lips and Perioral Area: None, normal Jaw: None, normal Tongue: None, normal,Extremity Movements Upper (arms, wrists, hands, fingers): None, normal Lower (legs, knees, ankles, toes): None, normal, Trunk Movements Neck, shoulders, hips: None, normal, Overall Severity Severity of abnormal movements (highest score from questions above): None, normal Incapacitation due to abnormal movements: None, normal Patient's awareness of abnormal movements (rate only patient's report): No Awareness, Dental Status Current problems with teeth and/or dentures?: No Does patient usually wear dentures?: No  CIWA:  CIWA-Ar Total: 1  Musculoskeletal: Strength & Muscle Tone: within normal limits Gait & Station: normal Patient leans: N/A  Psychiatric Specialty Exam:  Presentation  General Appearance: wearing makeup and hair in pig tails, adequate hygiene  Eye Contact:Fair  Speech: clear and coherent  Speech Volume:Normal  Handedness:Right   Mood and Affect  Mood: described as "good" - appears more euthymic  Affect: less child-like and no longer labile; calm    Thought Process  Thought  Processes:Concrete, superficially goal directed but at times circumstantial and tangential  Descriptions of Associations:Tangential  Orientation:Full (Time, Place and Person)  Thought Content: Denies AVH, paranoia, ideas of reference, or first rank symptoms - is not grossly responding to internal/external stimuli on exam; continues to endorse belief in exposure to toxic fumes prior to admission  History of Schizophrenia/Schizoaffective disorder:Yes  Duration of Psychotic Symptoms:Greater than six months  Hallucinations:Hallucinations: None Ideas of Reference:None  Suicidal Thoughts:Suicidal Thoughts: No Homicidal Thoughts:Homicidal Thoughts: No  Sensorium  Memory:Immediate Fair; Recent Fair; Remote Poor  Judgment: Improving   Insight:Improving    Executive Functions  Concentration:Fair  Attention Span:Fair  Recall:untested  Fund of Knowledge:Fair  Language:Good   Psychomotor Activity  Psychomotor Activity:fidgety on exam; no tremors, no cogwheeling, no stiffness; AIMS 0  Assets  Assets:Housing; Leisure Time; Social Support   Sleep  Sleep:Sleep: Good Patient slept Number of Hours: 6.75   Physical Exam Vitals and nursing note reviewed.  Constitutional:      General: She is not in acute distress.    Appearance: She is not ill-appearing, toxic-appearing or diaphoretic.  HENT:     Head: Normocephalic.  Pulmonary:     Effort: Pulmonary effort is normal.  Neurological:     General: No focal deficit present.     Mental Status: She is alert.   Review of Systems  Constitutional:  Negative for chills and fever.  Respiratory:  Negative for shortness of breath.   Cardiovascular:  Negative for chest pain.  Gastrointestinal:  Negative for constipation, nausea and vomiting.  Neurological:  Negative for headaches.  Blood pressure 111/76, pulse 66, temperature 98.1 F (36.7 C), temperature source Oral, resp. rate 16, height 5\' 3"  (1.6 m), weight 76.2 kg, SpO2 99 %.  Body mass index is 29.76 kg/m.   Treatment Plan Summary: Daily contact with patient to assess and evaluate symptoms and progress in treatment and Medication management  Principal Problem:   Bipolar I disorder, current or most recent episode manic, with psychotic features ([place]) Active Problems:   PTSD (post-traumatic stress  disorder)   Catatonia - resolved   PLAN #Bipolar I manic with psychotic features (r/o schizoaffective d/o bipolar type) CSW has placed an ACT team referral Continue agitation protocol PRN Continue Cogentin 0.5mg  bid PRN for EPS Continue Melatonin 3mg  qhs for insomnia Continue propranolol 10 mg TID daily for help with restlessness Continue Risperdal 1 mg BID (refusing LAI) QTC 464ms (repeating EKG to monitor QTC) A1c 5.0, Triglycerides 214, HDL 40, cholesterol 141, LDL 58 Attempting to contact family to see if she is nearing clinical baseline   #Catatonia, resolved Ativan taper completed   Multinodular goiter/hyperthyroidism Patient continues to refuse medication for hyperthyroidism  Continue Inderal 10mg  TID for help with restlessness We will schedule patient up for outpatient follow-up   73mm right adrenal adenoma noted on CT scan 10/19/20 We will schedule patient up for outpatient follow-up  Elevated triglycerides Will need PCP f/u after discharge for fasting lab recheck     3. Safety and Monitoring: Involuntary admission to inpatient psychiatric unit for safety, stabilization and treatment Daily contact with patient to assess and evaluate symptoms and progress in treatment Patient's case to be discussed in multi-disciplinary team meeting Observation Level : q15 minute checks Vital signs: q12 hours Precautions: suicide, elopement, and assault   4. Discharge Planning: Social work and case management to assist with discharge planning and identification of hospital follow-up needs prior to discharge Discharge Concerns: Need to establish a safety  plan; Medication compliance and effectiveness Discharge Goals: Return home with outpatient referrals for mental health follow-up including medication management/psychotherapy  Signed: Merrily Brittle, DO Psychiatry Resident, PGY-1 [facility] 12/11/2020, 1:05 PM

## 2020-12-11 NOTE — Progress Notes (Signed)
Progress note    12/11/20 0811  Psych Admission Type (Psych Patients Only)  Admission Status Involuntary  Psychosocial Assessment  Patient Complaints Anxiety  Eye Contact Fair  Facial Expression Animated;Anxious  Affect Anxious;Appropriate to circumstance  Speech Logical/coherent  Interaction Assertive  Motor Activity Restless;Slow  Appearance/Hygiene Unremarkable  Behavior Characteristics Cooperative;Appropriate to situation;Anxious  Mood Anxious;Pleasant  Thought Process  Coherency WDL  Delusions None reported or observed  Hallucination None reported or observed  Judgment WDL  Confusion None  Danger to Self  Current suicidal ideation? Denies  Danger to Others  Danger to Others None reported or observed

## 2020-12-11 NOTE — Plan of Care (Signed)
?  Problem: Coping: ?Goal: Ability to verbalize frustrations and anger appropriately will improve ?Outcome: Progressing ?Goal: Ability to demonstrate self-control will improve ?Outcome: Progressing ?  ?Problem: Health Behavior/Discharge Planning: ?Goal: Compliance with treatment plan for underlying cause of condition will improve ?Outcome: Progressing ?  ?

## 2020-12-11 NOTE — [facility] Group Notes (Signed)
[place] Group Notes:  (Nursing/MHT/Case Management/Adjunct)  Date:  12/11/2020  Time:  5:23 PM  Type of Therapy:  Group Therapy  Participation Level:  Active  Participation Quality:  Appropriate  Affect:  Appropriate  Cognitive:  Appropriate  Insight:  Appropriate  Engagement in Group:  Lacking  Modes of Intervention:  Discussion  Summary of Progress/Problems:Patient attended group but was not engaged. Patient took the Anger packet to read later.   Jerrye Beavers 12/11/2020, 5:23 PM

## 2020-12-11 NOTE — Group Note (Signed)
Recreation Therapy Group Note   Group Topic:Animal Assisted Therapy   Group Date: 12/11/2020 Start Time: 1430 End Time: 1500 Facilitators: Victorino Sparrow, LRT/CTRS Location: [place]   AAA/T Program Assumption of Risk Form signed by Patient/ or Parent Legal Guardian Yes  Patient is free of allergies or severe asthma Yes  Patient reports no fear of animals Yes  Patient reports no history of cruelty to animals Yes  Patient understands his/her participation is voluntary Yes  Patient washes hands before animal contact Yes  Patient washes hands after animal contact Yes    Affect/Mood: Appropriate   Participation Level: Engaged   Participation Quality: Independent   Behavior: Appropriate   Speech/Thought Process: Focused   Insight: Good   Judgement: Good   Modes of Intervention: Nurse, adult   Patient Response to Interventions:  Engaged   Education Outcome:  Acknowledges education and In group clarification offered    Clinical Observations/Individualized Feedback:  Patient attended session and interacted appropriately with therapy dog and peers. Patient asked appropriate questions about therapy dog and his training. Patient shared stories about their pets at home with group.    Plan: Continue to engage patient in RT group sessions 2-3x/week.   Victorino Sparrow, LRT/CTRS  12/11/2020 3:57 PM

## 2020-12-11 NOTE — Progress Notes (Signed)
   12/11/20 2000  Psych Admission Type (Psych Patients Only)  Admission Status Involuntary  Psychosocial Assessment  Patient Complaints Anxiety  Eye Contact Fair  Facial Expression Animated;Anxious  Affect Anxious;Appropriate to circumstance  Speech Logical/coherent  Interaction Assertive  Motor Activity Restless;Slow  Appearance/Hygiene Unremarkable  Behavior Characteristics Cooperative  Mood Anxious;Pleasant  Thought Process  Coherency WDL  Delusions None reported or observed  Hallucination None reported or observed  Judgment WDL  Confusion None  Danger to Self  Current suicidal ideation? Denies  Danger to Others  Danger to Others None reported or observed

## 2020-12-12 ENCOUNTER — Encounter (HOSPITAL_COMMUNITY): Payer: Self-pay

## 2020-12-12 DIAGNOSIS — F312 Bipolar disorder, current episode manic severe with psychotic features: Secondary | ICD-10-CM | POA: Diagnosis not present

## 2020-12-12 NOTE — Progress Notes (Addendum)
[facility] MD Progress Note  12/12/2020 8:56 PM Veronica Perez  MRN:  [id number]  Subjective:  Veronica Perez is a 45 y.o. female  with history of bipolar affective disorder presenting involuntarily from [place] [facility] ED due to worsening anxiety, paranoia, and self-neglect, physical aggression and HI, then admitted to [facility] for management of mania. Xenia Day 16     Overnight Events: No acute events, no agitation PRNs. Attended group therapy appropriately. Patient slept Number of Hours: 6   Interim History: Patient was evaluated this AM with attending Dr. Nelda Marseille.  Patient was initially seen walking around the unit. Reported that she slept well, no complaints about mood, and that her appetite is appropriate.  She describes her mood currently "like a hummingbird, upbeat and buzzing around". Patient continued to reject Risperdal LAI, stating that she only believes taking medicines by mouth.  Still continues to reject any other psychotropics, stating that she only needs the Risperdal. She shows some improved insight into her condition today and expresses plan to continue po medications and use a pill box to help with compliance after discharge. Instructed patient that she needs to follow up with her outpatient provider that follows her for her hyperthyroidism, stated that we will help facilitate her appointment.  Patient denied SI/HI/AVH, delusions, paranoia, first rank symptoms. Patient is not grossly responding to internal/external stimuli on exam and did not make delusional statements.  Principal Problem: Bipolar I disorder, current or most recent episode manic, with psychotic features ([place]) Diagnosis: Principal Problem:   Bipolar I disorder, current or most recent episode manic, with psychotic features ([place]) Active Problems:   PTSD (post-traumatic stress disorder)   Catatonia  Total Time spent with patient: 30 minutes  Past Psychiatric History: see  H&P  Past Medical History:  Past Medical History:  Diagnosis Date   Bipolar disorder ([place])    Depression    Former smoker, stopped smoking many years ago    HSV (herpes simplex virus) infection    Hypothyroid    Low TSH level 05/30/2016   Panic attacks    Thyroid disease     Past Surgical History:  Procedure Laterality Date   ADENOIDECTOMY     OVARIAN CYST SURGERY     TONSILLECTOMY     Family History:  Family History  Problem Relation Age of Onset   Diabetes Father    Heart disease Father    Cancer Maternal Grandmother        breast cancer   Bipolar disorder Mother    Thyroid disease Neg Hx    Family Psychiatric  History: see H&P  Social History:  Social History   Substance and Sexual Activity  Alcohol Use Not Currently   Comment: denies     Social History   Substance and Sexual Activity  Drug Use No    Social History   Socioeconomic History   Marital status: Single    Spouse name: Not on file   Number of children: 1   Years of education: Not on file   Highest education level: High school graduate  Occupational History   Not on file  Tobacco Use   Smoking status: Every Day    Packs/day: 25.00    Types: Cigarettes   Smokeless tobacco: Never  Vaping Use   Vaping Use: Never used  Substance and Sexual Activity   Alcohol use: Not Currently    Comment: denies   Drug use: No   Sexual activity: Yes  Birth control/protection: None  Other Topics Concern   Not on file  Social History Narrative   Not on file   Social Determinants of Health   Financial Resource Strain: Not on file  Food Insecurity: Not on file  Transportation Needs: Not on file  Physical Activity: Not on file  Stress: Not on file  Social Connections: Not on file   Additional Social History:  Patient lives at home with daughter near her parents.  Sleep: Good  Appetite:  Good  Current Medications: Current Facility-Administered Medications  Medication Dose Route Frequency  Provider Last Rate Last Admin   acetaminophen (TYLENOL) tablet 650 mg  650 mg Oral Q6H PRN Suella Broad, FNP   650 mg at 12/07/20 2148   alum & mag hydroxide-simeth (MAALOX/MYLANTA) 200-200-20 MG/5ML suspension 30 mL  30 mL Oral Q4H PRN Suella Broad, FNP       benztropine (COGENTIN) tablet 0.5 mg  0.5 mg Oral BID PRN Viann Fish E, MD   0.5 mg at 12/09/20 2158   docusate sodium (COLACE) capsule 100 mg  100 mg Oral Daily France Ravens, MD   100 mg at 12/12/20 0832   LORazepam (ATIVAN) injection 1 mg  1 mg Intramuscular Q6H PRN Harlow Asa, MD       OLANZapine zydis (ZYPREXA) disintegrating tablet 10 mg  10 mg Oral Q8H PRN France Ravens, MD       And   LORazepam (ATIVAN) tablet 1 mg  1 mg Oral PRN France Ravens, MD       And   ziprasidone (GEODON) injection 20 mg  20 mg Intramuscular PRN France Ravens, MD       magnesium hydroxide (MILK OF MAGNESIA) suspension 30 mL  30 mL Oral Daily PRN Suella Broad, FNP   30 mL at 12/12/20 0835   melatonin tablet 3 mg  3 mg Oral QPC supper Merrily Brittle, DO   3 mg at 12/12/20 1839   propranolol (INDERAL) tablet 10 mg  10 mg Oral TID Harlow Asa, MD   10 mg at 12/12/20 1800   risperiDONE (RISPERDAL M-TABS) disintegrating tablet 1 mg  1 mg Oral Daily Hill, Jackie Plum, MD   1 mg at 12/12/20 0831   risperiDONE (RISPERDAL M-TABS) disintegrating tablet 1 mg  1 mg Oral QHS Hill, Jackie Plum, MD   1 mg at 12/11/20 2127    Lab Results: No results found for this or any previous visit (from the past 48 hour(s)).  Blood Alcohol level:  Lab Results  Component Value Date   ETH <10 11/25/2020   ETH <10 01/75/1025    Metabolic Disorder Labs: Lab Results  Component Value Date   HGBA1C 5.0 11/28/2020   MPG 96.8 11/28/2020   MPG 96.8 06/28/2019   Lab Results  Component Value Date   PROLACTIN 13.1 06/28/2019   PROLACTIN 69.3 (H) 09/17/2016   Lab Results  Component Value Date   CHOL 141 11/28/2020   TRIG 214 (H) 11/28/2020    HDL 40 (L) 11/28/2020   CHOLHDL 3.5 11/28/2020   VLDL 43 (H) 11/28/2020   LDLCALC 58 11/28/2020   LDLCALC 103 (H) 06/28/2019    Physical Findings: AIMS: Facial and Oral Movements Muscles of Facial Expression: None, normal Lips and Perioral Area: None, normal Jaw: None, normal Tongue: None, normal,Extremity Movements Upper (arms, wrists, hands, fingers): None, normal Lower (legs, knees, ankles, toes): None, normal, Trunk Movements Neck, shoulders, hips: None, normal, Overall Severity Severity of abnormal movements (highest  score from questions above): None, normal Incapacitation due to abnormal movements: None, normal Patient's awareness of abnormal movements (rate only patient's report): No Awareness, Dental Status Current problems with teeth and/or dentures?: No Does patient usually wear dentures?: No  CIWA:  CIWA-Ar Total: 1  Musculoskeletal: Strength & Muscle Tone: within normal limits Gait & Station: normal Patient leans: N/A  Psychiatric Specialty Exam:  Presentation  General Appearance: wearing makeup and hair in pig tails, adequate hygiene  Eye Contact:Good  Speech: clear and coherent, goal directed - not pressured  Speech Volume:Normal  Handedness:Right   Mood and Affect  Mood: She described her mood as cheerful, "like a hummingbird".    Affect: Child-like, but no longer elevated, irritable, or expansive   Thought Process  Thought Processes:Concrete, superficially goal directed but at times circumstantial and tangential  Descriptions of Associations:Tangential  Orientation:Full (Time, Place and Person)  Thought Content: Denies AVH, paranoia, ideas of reference, or first rank symptoms - is not grossly responding to internal/external stimuli on exam; does not make delusional statements today  History of Schizophrenia/Schizoaffective disorder:Yes  Duration of Psychotic Symptoms:Greater than six months  Hallucinations:Hallucinations: None Ideas of  Reference:None  Suicidal Thoughts:Suicidal Thoughts: No Homicidal Thoughts:Homicidal Thoughts: No  Sensorium  Memory:Immediate Fair; Recent Fair; Remote Poor  Judgment: Improving   Insight:Improving    Executive Functions  Concentration:Fair  Attention Span:Fair  Recall:untested  Fund of Knowledge:Fair  Language:Good   Psychomotor Activity  Psychomotor Activity:fidgety on exam but no akathisias or tremors noted  Assets  Assets:Housing; Leisure Time; Social Support   Sleep  Sleep:Sleep: Good Patient slept Number of Hours: 6   Physical Exam Vitals and nursing note reviewed.  Constitutional:      General: She is not in acute distress.    Appearance: She is not ill-appearing, toxic-appearing or diaphoretic.  HENT:     Head: Normocephalic.  Pulmonary:     Effort: Pulmonary effort is normal.  Neurological:     General: No focal deficit present.     Mental Status: She is alert.   Review of Systems  Constitutional:  Negative for chills and fever.  Respiratory:  Negative for shortness of breath.   Cardiovascular:  Negative for chest pain.  Gastrointestinal:  Negative for constipation, nausea and vomiting.  Neurological:  Negative for headaches.  Blood pressure 111/64, pulse 66, temperature (!) 97.5 F (36.4 C), temperature source Oral, resp. rate 16, height 5\' 3"  (1.6 m), weight 76.2 kg, SpO2 99 %. Body mass index is 29.76 kg/m.   Treatment Plan Summary: Daily contact with patient to assess and evaluate symptoms and progress in treatment and Medication management  Principal Problem:   Bipolar I disorder, current or most recent episode manic, with psychotic features ([place]) Active Problems:   PTSD (post-traumatic stress disorder)   Catatonia - resolved   PLAN #Bipolar I manic with psychotic features (r/o schizoaffective d/o bipolar type) CSW has placed an ACT team referral Continue agitation protocol PRN Continue Cogentin 0.5mg  bid PRN for EPS Continue  Melatonin 3mg  qhs for insomnia Continue propranolol 10 mg TID daily for help with restlessness Continue Risperdal 1 mg BID (refusing LAI) Repeat QTc 417 A1c 5.0, Triglycerides 214, HDL 40, cholesterol 141, LDL 58 Attempting to contact family to see if she is nearing clinical baseline   #Catatonia, resolved Ativan taper completed   Multinodular goiter/hyperthyroidism Patient continues to refuse medication for hyperthyroidism  Continue Inderal 10mg  TID for help with restlessness We will schedule patient up for outpatient follow-up   53mm  right adrenal adenoma noted on CT scan 10/19/20 We will schedule patient up for outpatient follow-up  Elevated triglycerides Will need PCP f/u after discharge for fasting lab recheck     3. Safety and Monitoring: Involuntary admission to inpatient psychiatric unit for safety, stabilization and treatment Daily contact with patient to assess and evaluate symptoms and progress in treatment Patient's case to be discussed in multi-disciplinary team meeting Observation Level : q15 minute checks Vital signs: q12 hours Precautions: suicide, elopement, and assault   4. Discharge Planning: Social work and case management to assist with discharge planning and identification of hospital follow-up needs prior to discharge Discharge Concerns: Need to establish a safety plan; Medication compliance and effectiveness Discharge Goals: Return home with outpatient referrals for mental health follow-up including medication management/psychotherapy Dispo: Home to apartment with daughter ACTT referral per CSW  Signed: Merrily Brittle, DO Psychiatry Resident, PGY-1 [facility] 12/12/2020, 8:56 PM

## 2020-12-12 NOTE — Progress Notes (Signed)
EKG results placed on the outside of pt's shadow chart  Normal sinus rhythm Normal ECG  QT/Qtc-Baz  408/417 ms

## 2020-12-12 NOTE — BH IP Treatment Plan (Signed)
Interdisciplinary Treatment and Diagnostic Plan Update  12/12/2020 Time of Session: 10:35am  CORRINNE BENEGAS MRN: [id number]  Principal Diagnosis: Bipolar I disorder, current or most recent episode manic, with psychotic features ([place])  Secondary Diagnoses: Principal Problem:   Bipolar I disorder, current or most recent episode manic, with psychotic features ([place]) Active Problems:   PTSD (post-traumatic stress disorder)   Catatonia   Current Medications:  Current Facility-Administered Medications  Medication Dose Route Frequency Provider Last Rate Last Admin   acetaminophen (TYLENOL) tablet 650 mg  650 mg Oral Q6H PRN Suella Broad, FNP   650 mg at 12/07/20 2148   alum & mag hydroxide-simeth (MAALOX/MYLANTA) 200-200-20 MG/5ML suspension 30 mL  30 mL Oral Q4H PRN Suella Broad, FNP       benztropine (COGENTIN) tablet 0.5 mg  0.5 mg Oral BID PRN Viann Fish E, MD   0.5 mg at 12/09/20 2158   docusate sodium (COLACE) capsule 100 mg  100 mg Oral Daily France Ravens, MD   100 mg at 12/12/20 0832   LORazepam (ATIVAN) injection 1 mg  1 mg Intramuscular Q6H PRN Harlow Asa, MD       OLANZapine zydis (ZYPREXA) disintegrating tablet 10 mg  10 mg Oral Q8H PRN France Ravens, MD       And   LORazepam (ATIVAN) tablet 1 mg  1 mg Oral PRN France Ravens, MD       And   ziprasidone (GEODON) injection 20 mg  20 mg Intramuscular PRN France Ravens, MD       magnesium hydroxide (MILK OF MAGNESIA) suspension 30 mL  30 mL Oral Daily PRN Suella Broad, FNP   30 mL at 12/12/20 0835   melatonin tablet 3 mg  3 mg Oral QPC supper Merrily Brittle, DO   3 mg at 12/11/20 2127   propranolol (INDERAL) tablet 10 mg  10 mg Oral TID Harlow Asa, MD   10 mg at 12/12/20 1254   risperiDONE (RISPERDAL M-TABS) disintegrating tablet 1 mg  1 mg Oral Daily Hill, Jackie Plum, MD   1 mg at 12/12/20 0831   risperiDONE (RISPERDAL M-TABS) disintegrating tablet 1 mg  1 mg Oral QHS Hill, Jackie Plum, MD    1 mg at 12/11/20 2127   PTA Medications: Medications Prior to Admission  Medication Sig Dispense Refill Last Dose   propranolol (INDERAL) 10 MG tablet Take 0.5 tablets (5 mg total) by mouth 2 (two) times daily. 30 tablet 0    QUEtiapine (SEROQUEL) 25 MG tablet Take 1 tablet (25 mg total) by mouth at bedtime. (Patient taking differently: Take 25 mg by mouth 2 (two) times daily.) 30 tablet 0     Patient Stressors: Health problems   Medication change or noncompliance   Traumatic event    Patient Strengths: Supportive family/friends   Treatment Modalities: Medication Management, Group therapy, Case management,  1 to 1 session with clinician, Psychoeducation, Recreational therapy.   Physician Treatment Plan for Primary Diagnosis: Bipolar I disorder, current or most recent episode manic, with psychotic features ([place]) Long Term Goal(s): Improvement in symptoms so as ready for discharge   Short Term Goals: Ability to identify changes in lifestyle to reduce recurrence of condition will improve Ability to verbalize feelings will improve Ability to disclose and discuss suicidal ideas Ability to demonstrate self-control will improve Ability to identify and develop effective coping behaviors will improve Ability to maintain clinical measurements within normal limits will improve Compliance with prescribed medications will improve  Ability to identify triggers associated with substance abuse/mental health issues will improve  Medication Management: Evaluate patient's response, side effects, and tolerance of medication regimen.  Therapeutic Interventions: 1 to 1 sessions, Unit Group sessions and Medication administration.  Evaluation of Outcomes: Progressing  Physician Treatment Plan for Secondary Diagnosis: Principal Problem:   Bipolar I disorder, current or most recent episode manic, with psychotic features ([place]) Active Problems:   PTSD (post-traumatic stress disorder)   Catatonia  Long  Term Goal(s): Improvement in symptoms so as ready for discharge   Short Term Goals: Ability to identify changes in lifestyle to reduce recurrence of condition will improve Ability to verbalize feelings will improve Ability to disclose and discuss suicidal ideas Ability to demonstrate self-control will improve Ability to identify and develop effective coping behaviors will improve Ability to maintain clinical measurements within normal limits will improve Compliance with prescribed medications will improve Ability to identify triggers associated with substance abuse/mental health issues will improve     Medication Management: Evaluate patient's response, side effects, and tolerance of medication regimen.  Therapeutic Interventions: 1 to 1 sessions, Unit Group sessions and Medication administration.  Evaluation of Outcomes: Progressing   RN Treatment Plan for Primary Diagnosis: Bipolar I disorder, current or most recent episode manic, with psychotic features ([place]) Long Term Goal(s): Knowledge of disease and therapeutic regimen to maintain health will improve  Short Term Goals: Ability to remain free from injury will improve, Ability to participate in decision making will improve, Ability to verbalize feelings will improve, Ability to disclose and discuss suicidal ideas, and Ability to identify and develop effective coping behaviors will improve  Medication Management: RN will administer medications as ordered by provider, will assess and evaluate patient's response and provide education to patient for prescribed medication. RN will report any adverse and/or side effects to prescribing provider.  Therapeutic Interventions: 1 on 1 counseling sessions, Psychoeducation, Medication administration, Evaluate responses to treatment, Monitor vital signs and CBGs as ordered, Perform/monitor CIWA, COWS, AIMS and Fall Risk screenings as ordered, Perform wound care treatments as ordered.  Evaluation of  Outcomes: Progressing   LCSW Treatment Plan for Primary Diagnosis: Bipolar I disorder, current or most recent episode manic, with psychotic features ([place]) Long Term Goal(s): Safe transition to appropriate next level of care at discharge, Engage patient in therapeutic group addressing interpersonal concerns.  Short Term Goals: Engage patient in aftercare planning with referrals and resources, Increase social support, Increase emotional regulation, Facilitate acceptance of mental health diagnosis and concerns, Identify triggers associated with mental health/substance abuse issues, and Increase skills for wellness and recovery  Therapeutic Interventions: Assess for all discharge needs, 1 to 1 time with Social worker, Explore available resources and support systems, Assess for adequacy in community support network, Educate family and significant other(s) on suicide prevention, Complete Psychosocial Assessment, Interpersonal group therapy.  Evaluation of Outcomes: Progressing   Progress in Treatment: Attending groups: Yes. Participating in groups: Yes. Taking medication as prescribed: Yes. Toleration medication: Yes. Family/Significant other contact made: Yes, individual(s) contacted:  Mother  Patient understands diagnosis: Yes. Discussing patient identified problems/goals with staff: Yes. Medical problems stabilized or resolved: Yes. Denies suicidal/homicidal ideation: Yes. Issues/concerns per patient self-inventory: No.   New problem(s) identified: No, Describe:  None   New Short Term/Long Term Goal(s): medication stabilization, elimination of SI thoughts, development of comprehensive mental wellness plan.   Patient Goals: "To do better with my back injury"   Discharge Plan or Barriers: Patient recently admitted. CSW will continue to follow and  assess for appropriate referrals and possible discharge planning.   Reason for Continuation of Hospitalization: Medication  stabilization  Estimated Length of Stay: 3 to 5 days    Scribe for Treatment Team: Darleen Crocker, Latanya Presser 12/12/2020 3:17 PM

## 2020-12-12 NOTE — Group Note (Addendum)
LCSW Group Therapy Note   Group Date: 12/12/2020 Start Time: 1300 End Time: 1400   Type of Therapy and Topic:  Group Therapy:   Participation Level:  Active  Description of Group:Group encouraged increased engagement and participation through discussion focused on Safety Planning. Patients worked both individually and collaboratively to create and discuss the different elements of a safety plan, including identifying warning signs, coping skills, professional supports, people you can ask for help, how to make the environment safe, and reasons for life worth living. Remainder of group was spent filling out individual safety plans to be placed in patient charts.   Therapeutic Goal(s): Identify warning signs and triggers Identify positive coping strategies Identify professional and personal supports when experiencing a mental health crisis Identify ways in which you can make the environment safe Identify reasons for life worth living Identify the steps to completing a safety plan and provide education on completing a safety plan at discharge   Summary of Patient Progress:  Pt came to group and introduced herself. Pt shared during introductions and stated that she feels most safe at home or her mother's home. Pt was appropriate during group and participated in discussion.   Mliss Fritz, LCSWA 12/12/2020  1:37 PM

## 2020-12-12 NOTE — Progress Notes (Signed)
   12/12/20 2200  Psych Admission Type (Psych Patients Only)  Admission Status Involuntary  Psychosocial Assessment  Patient Complaints Worrying  Eye Contact Fair  Facial Expression Animated;Anxious  Affect Anxious;Appropriate to circumstance  Speech Logical/coherent  Interaction Assertive  Motor Activity Restless;Slow  Appearance/Hygiene Unremarkable  Behavior Characteristics Cooperative  Mood Anxious;Pleasant  Aggressive Behavior  Effect No apparent injury  Thought Process  Coherency WDL  Content WDL  Delusions None reported or observed  Perception WDL  Hallucination None reported or observed  Judgment WDL  Confusion None  Danger to Self  Current suicidal ideation? Denies  Danger to Others  Danger to Others None reported or observed

## 2020-12-12 NOTE — Progress Notes (Signed)
Recreation Therapy Notes  Date: 11.9.22 Time: 0930-1000 Location: [address] Dayroom  Group Topic: Stress Management   Goal Area(s) Addresses:  Patient will actively participate in stress management techniques presented during session.  Patient will successfully identify benefit of practicing stress management post d/c.   Intervention: Relaxation exercise with ambient sound and script   Activity: Guided Imagery. LRT provided education, instruction, and demonstration on practice of visualization via guided imagery. Patient was asked to participate in the technique introduced during session. LRT debriefed including topics of mindfulness, stress management and specific scenarios each patient could use these techniques. Patients were given suggestions of ways to access scripts post d/c and encouraged to explore Youtube and other apps available on smartphones, tablets, and computers.  Education:  Stress Management, Discharge Planning.   Clinical Observations/Feedback: Group did not occur due to orientation group going over 20 minutes.  Packets were given with information on ways to manage stress, techniques that can be used, proper breathing techniques and how to properly do the techniques given such as progressive muscle relaxation, diaphragmatic breathing and mindfulness meditation.     Victorino Sparrow, LRT, CTRS        Ria Comment, Brandy Kabat A 12/12/2020 12:06 PM

## 2020-12-12 NOTE — Progress Notes (Signed)
Adult Psychoeducational Group Note  Date:  12/12/2020 Time:  8:46 PM  Group Topic/Focus:  Wrap-Up Group:   The focus of this group is to help patients review their daily goal of treatment and discuss progress on daily workbooks.  Participation Level:  Active  Participation Quality:  Appropriate  Affect:  Appropriate  Cognitive:  Appropriate  Insight: Appropriate  Engagement in Group:  Engaged  Modes of Intervention:  Discussion  Additional Comments:  Patient said his day was 47. His goal for today to sit still and relax back muscle due to back pain she some what achieved goals coping skills reading and writing   Barbette Hair 12/12/2020, 8:46 PM

## 2020-12-12 NOTE — Progress Notes (Signed)
Adult Psychoeducational Group Note  Date:  12/12/2020 Time:  10:40 AM  Group Topic/Focus:  Goals Group:   The focus of this group is to help patients establish daily goals to achieve during treatment and discuss how the patient can incorporate goal setting into their daily lives to aide in recovery.  Participation Level:  Active  Participation Quality:  Appropriate  Affect:  Appropriate  Cognitive:  Appropriate  Insight: Appropriate  Engagement in Group:  Engaged  Modes of Intervention:  Discussion  Additional Comments:  Pt stated her goal for the day is to prepare for discharge.  Tonia Brooms D 12/12/2020, 10:40 AM

## 2020-12-12 NOTE — Progress Notes (Addendum)
   12/12/20 1300  Psych Admission Type (Psych Patients Only)  Admission Status Involuntary  Psychosocial Assessment  Patient Complaints Worrying  Eye Contact Fair  Facial Expression Animated;Anxious  Affect Anxious;Appropriate to circumstance  Speech Logical/coherent  Interaction Assertive  Motor Activity Restless;Slow  Appearance/Hygiene Unremarkable  Behavior Characteristics Cooperative  Mood Anxious;Pleasant  Aggressive Behavior  Effect No apparent injury  Thought Process  Coherency WDL  Content WDL  Delusions None reported or observed  Perception WDL  Hallucination None reported or observed  Judgment WDL  Confusion None  Danger to Self  Current suicidal ideation? Denies  Danger to Others  Danger to Others None reported or observed   D. Pt continues to present as hyper, although improving- pleasant during interactions. Per pt's self inventory, pt rated her depression, hopelessness and anxiety a 0/0/3, respectively. Pt wrote that her goal today was "to go home and continue home schooling my daughter and continue house work. Pt has been visible in the milieu interacting appropriately with peers and staff and observed attending groups. Pt currently denies SI/HI and AVH A. Labs and vitals monitored. Pt given and educated on medications. Pt supported emotionally and encouraged to express concerns and ask questions.   R. Pt remains safe with 15 minute checks. Will continue POC.

## 2020-12-13 DIAGNOSIS — F312 Bipolar disorder, current episode manic severe with psychotic features: Secondary | ICD-10-CM | POA: Diagnosis not present

## 2020-12-13 MED ORDER — RISPERIDONE 2 MG PO TBDP
2.0000 mg | ORAL_TABLET | Freq: Every day | ORAL | Status: DC
  Administered 2020-12-13 – 2020-12-14 (×2): 2 mg via ORAL
  Filled 2020-12-13 (×5): qty 1

## 2020-12-13 MED ORDER — TAB-A-VITE/IRON PO TABS
1.0000 | ORAL_TABLET | Freq: Every day | ORAL | Status: DC
  Administered 2020-12-13 – 2020-12-15 (×3): 1 via ORAL
  Filled 2020-12-13 (×5): qty 1

## 2020-12-13 NOTE — [facility] Group Notes (Signed)
PsychoEducational group completed. Patients were asked to read ''There's a hole in my sidewalk by Mabeline Caras'' and identify negative behavioral patterns in which that has affected their mental health. Pt attended late and was inattentive in group.

## 2020-12-13 NOTE — [facility] Group Notes (Signed)
Adult Psychoeducational Group Note  Date:  12/13/2020 Time:  9:39 PM  Group Topic/Focus:  Wrap-Up Group:   The focus of this group is to help patients review their daily goal of treatment and discuss progress on daily workbooks.  Participation Level:  Active  Participation Quality:  Appropriate  Affect:  Appropriate  Cognitive:  Alert  Insight: Appropriate  Engagement in Group:  Engaged  Modes of Intervention:  Discussion  Additional Comments:  Patient attended and participated in the wrap-up group.  Annie Sable 12/13/2020, 9:39 PM

## 2020-12-13 NOTE — Progress Notes (Addendum)
Veronica Perez  12/13/2020 8:25 AM Veronica Perez  MRN:  [id number]  Subjective:  Veronica Perez is a 45 y.o. female  with history of bipolar affective disorder presenting involuntarily from [facility] due to worsening anxiety, paranoia, and self-neglect, physical aggression and HI, then admitted to [facility] for management of mania. [place] Day 17     Overnight Events: Patient slept Number of Hours: 5  Patient was compliant with scheduled meds, required no agitation PRN, attended group therapy, no behavioral concerns per notes.  Interim History: Veronica Perez  was evaluated this AM with attending Dr. Nelda Marseille. Patient was initially seen talking on the phone. She exhibited no insight in her condition and appears guarded with more rapid speech, more disorganized thought,  and childlike behaviors during interview.  Of Perez, there was complaint of patient being hyperverbal and staying up to 12 AM keeping her roommate up last night.Patient spontaneously stated that her sleep, mood, and appetite are good, that she has been compliant with her medicines, and that she is ready to home. She then stated that she has to go home because she needs to help her family. Patient denied SI/HI/AVH, delusions, paranoia, first rank symptoms.  When questioned about belief of being poisoned prior to admission with hand sanitizer fumes, she states "the fumes were so great and there was no circulation." Discussed with patient that she appears to be more disorganized with some residual mania today at which time she became defensive and irritable. She derailed into discussion that her symptoms are related to poor nutrition.  She then talked about supplements and nutrition and made additional requests for a MVI daily.  Discussed with patient that there will be a family meeting tomorrow at 2 PM, to bring insight on her mania symptoms and to assess if she is nearing her  baseline.  Principal Problem: Bipolar I disorder, current or most recent episode manic, with psychotic features ([place]) Diagnosis: Principal Problem:   Bipolar I disorder, current or most recent episode manic, with psychotic features ([place]) Active Problems:   PTSD (post-traumatic stress disorder)   Catatonia  Total Time spent with patient: I personally spent 35 minutes on the unit in direct patient care. The direct patient care time included face-to-face time with the patient, reviewing the patient's chart, communicating with other professionals, and coordinating care. Greater than 50% of this time was spent in counseling or coordinating care with the patient regarding goals of hospitalization, psycho-education, and discharge planning needs.  Past Psychiatric History: see H&P  Past Medical History:  Past Medical History:  Diagnosis Date   Bipolar disorder ([place])    Depression    Former smoker, stopped smoking many years ago    HSV (herpes simplex virus) infection    Hypothyroid    Low TSH level 05/30/2016   Panic attacks    Thyroid disease     Past Surgical History:  Procedure Laterality Date   ADENOIDECTOMY     OVARIAN CYST SURGERY     TONSILLECTOMY     Family History:  Family History  Problem Relation Age of Onset   Diabetes Father    Heart disease Father    Cancer Maternal Grandmother        breast cancer   Bipolar disorder Mother    Thyroid disease Neg Hx    Family Psychiatric  History: see H&P  Social History:  Social History   Substance and Sexual Activity  Alcohol Use Not Currently  Comment: denies     Social History   Substance and Sexual Activity  Drug Use No    Social History   Socioeconomic History   Marital status: Single    Spouse name: Not on file   Number of children: 1   Years of education: Not on file   Highest education level: High school graduate  Occupational History   Not on file  Tobacco Use   Smoking status: Every Day     Packs/day: 25.00    Types: Cigarettes   Smokeless tobacco: Never  Vaping Use   Vaping Use: Never used  Substance and Sexual Activity   Alcohol use: Not Currently    Comment: denies   Drug use: No   Sexual activity: Yes    Birth control/protection: None  Other Topics Concern   Not on file  Social History Narrative   Not on file   Social Determinants of Health   Financial Resource Strain: Not on file  Food Insecurity: Not on file  Transportation Needs: Not on file  Physical Activity: Not on file  Stress: Not on file  Social Connections: Not on file   Additional Social History:  Patient lives at home with daughter near her parents.  Sleep: Good  Appetite:  Good  Current Medications: Current Facility-Administered Medications  Medication Dose Route Frequency Provider Last Rate Last Admin   acetaminophen (TYLENOL) tablet 650 mg  650 mg Oral Q6H PRN Suella Broad, FNP   650 mg at 12/07/20 2148   alum & mag hydroxide-simeth (MAALOX/MYLANTA) 200-200-20 MG/5ML suspension 30 mL  30 mL Oral Q4H PRN Suella Broad, FNP       benztropine (COGENTIN) tablet 0.5 mg  0.5 mg Oral BID PRN Viann Fish E, MD   0.5 mg at 12/09/20 2158   docusate sodium (COLACE) capsule 100 mg  100 mg Oral Daily France Ravens, MD   100 mg at 12/12/20 0832   LORazepam (ATIVAN) injection 1 mg  1 mg Intramuscular Q6H PRN Harlow Asa, MD       OLANZapine zydis (ZYPREXA) disintegrating tablet 10 mg  10 mg Oral Q8H PRN France Ravens, MD       And   LORazepam (ATIVAN) tablet 1 mg  1 mg Oral PRN France Ravens, MD       And   ziprasidone (GEODON) injection 20 mg  20 mg Intramuscular PRN France Ravens, MD       magnesium hydroxide (MILK OF MAGNESIA) suspension 30 mL  30 mL Oral Daily PRN Suella Broad, FNP   30 mL at 12/12/20 0835   melatonin tablet 3 mg  3 mg Oral QPC supper Merrily Brittle, DO   3 mg at 12/12/20 1839   multivitamins with iron tablet 1 tablet  1 tablet Oral Daily Merrily Brittle, DO        propranolol (INDERAL) tablet 10 mg  10 mg Oral TID Viann Fish E, MD   10 mg at 12/12/20 1800   risperiDONE (RISPERDAL M-TABS) disintegrating tablet 1 mg  1 mg Oral Daily Hill, Jackie Plum, MD   1 mg at 12/12/20 0831   risperiDONE (RISPERDAL M-TABS) disintegrating tablet 1 mg  1 mg Oral QHS Hill, Jackie Plum, MD   1 mg at 12/12/20 2110    Lab Results: No results found for this or any previous visit (from the past 48 hour(s)).  Blood Alcohol level:  Lab Results  Component Value Date   ETH <10 11/25/2020   ETH <  10 64/33/2951    Metabolic Disorder Labs: Lab Results  Component Value Date   HGBA1C 5.0 11/28/2020   MPG 96.8 11/28/2020   MPG 96.8 06/28/2019   Lab Results  Component Value Date   PROLACTIN 13.1 06/28/2019   PROLACTIN 69.3 (H) 09/17/2016   Lab Results  Component Value Date   CHOL 141 11/28/2020   TRIG 214 (H) 11/28/2020   HDL 40 (L) 11/28/2020   CHOLHDL 3.5 11/28/2020   VLDL 43 (H) 11/28/2020   LDLCALC 58 11/28/2020   LDLCALC 103 (H) 06/28/2019    Physical Findings: AIMS: Facial and Oral Movements Muscles of Facial Expression: None, normal Lips and Perioral Area: None, normal Jaw: None, normal Tongue: None, normal,Extremity Movements Upper (arms, wrists, hands, fingers): None, normal Lower (legs, knees, ankles, toes): None, normal, Trunk Movements Neck, shoulders, hips: None, normal, Overall Severity Severity of abnormal movements (highest score from questions above): None, normal Incapacitation due to abnormal movements: None, normal Patient's awareness of abnormal movements (rate only patient's report): No Awareness, Dental Status Current problems with teeth and/or dentures?: No Does patient usually wear dentures?: No  CIWA:  CIWA-Ar Total: 1  Musculoskeletal: Strength & Muscle Tone: within normal limits Gait & Station: normal Patient leans: N/A  Psychiatric Specialty Exam:  Presentation  General Appearance: wearing makeup and hair  in pig tails, adequate hygiene  Eye Contact:Good  Speech: rambling, rapid, normal fluency  Speech Volume:Normal  Handedness:Right   Mood and Affect  Mood: Anxious, defensive  Affect: Child-like, defensive, guarded   Thought Process  Thought Processes:Concrete, ruminative about discharge plans and need for MVI; more disorganized today   Orientation:Full (Time, Place and Person)  Thought Content: Denies AVH, paranoia, ideas of reference, or first rank symptoms - is not grossly responding to internal/external stimuli on exam but continues to have delusion of being poisoned prior to admission; is guarded on exam and paranoid appearing when discussing medication increase  History of Schizophrenia/Schizoaffective disorder:Yes  Duration of Psychotic Symptoms:Greater than six months  Hallucinations: Denied Ideas of Reference:None  Suicidal Thoughts: Denied Homicidal Thoughts: Denied  Sensorium  Memory:Immediate Fair; Recent Fair; Remote Poor  Judgment: Poor  Insight: Lacking   Executive Functions  Concentration:Poor - requiring frequent redirection  Attention Span:Poor - requiring frequent redirection  Recall: untested  Fund of Knowledge:Fair  Language:Good   Psychomotor Activity  Psychomotor Activity:fidgety on exam but no akathisias or tremors noted  Assets  Assets:Housing; Leisure Time; Social Support   Sleep  Sleep: Fair Patient slept Number of Hours: 5   Physical Exam Vitals and nursing Perez reviewed.  Constitutional:      General: She is not in acute distress.    Appearance: She is not ill-appearing, toxic-appearing or diaphoretic.  HENT:     Head: Normocephalic.  Pulmonary:     Effort: Pulmonary effort is normal.  Neurological:     General: No focal deficit present.     Mental Status: She is alert.   Review of Systems  Constitutional:  Negative for chills and fever.  Respiratory:  Negative for shortness of breath.   Cardiovascular:   Negative for chest pain.  Gastrointestinal:  Negative for constipation, nausea and vomiting.  Neurological:  Negative for headaches.  Blood pressure 115/66, pulse 73, temperature 97.6 F (36.4 C), temperature source Oral, resp. rate 16, height 5\' 3"  (1.6 m), weight 76.2 kg, SpO2 99 %. Body mass index is 29.76 kg/m.   Treatment Plan Summary: Daily contact with patient to assess and evaluate symptoms and progress  in treatment and Medication management  Principal Problem:   Bipolar I disorder, current or most recent episode manic, with psychotic features ([place]) Active Problems:   PTSD (post-traumatic stress disorder)   Catatonia - resolved   PLAN #Bipolar I manic with psychotic features (r/o schizoaffective d/o bipolar type) CSW has placed an ACT team referral Continue agitation protocol PRN Continue Cogentin 0.5mg  bid PRN for EPS Continue Melatonin 3mg  qhs for insomnia Continue propranolol 10 mg TID daily for help with restlessness Increased Risperdal to 1 mg qAM + 2 mg qPM for residual mania/paranoia (refusing LAI) Repeat QTc 417 on 11/9 A1c 5.0, Triglycerides 214, HDL 40, cholesterol 141, LDL 58 Family meeting at 2 PM on 11/11   #Catatonia, resolved Ativan taper completed   Multinodular goiter/hyperthyroidism Patient continues to refuse medication for hyperthyroidism  Continue Inderal 10mg  TID for help with restlessness We will schedule patient up for outpatient follow-up   68mm right adrenal adenoma noted on CT scan 10/19/20 We will schedule patient up for outpatient follow-up  Elevated triglycerides Will need PCP f/u after discharge for fasting lab recheck     3. Safety and Monitoring: Involuntary admission to inpatient psychiatric unit for safety, stabilization and treatment Daily contact with patient to assess and evaluate symptoms and progress in treatment Patient's case to be discussed in multi-disciplinary team meeting Observation Level : q15 minute checks Vital  signs: q12 hours Precautions: suicide, elopement, and assault   4. Discharge Planning: Social work and case management to assist with discharge planning and identification of hospital follow-up needs prior to discharge Discharge Concerns: Need to establish a safety plan; Medication compliance and effectiveness Discharge Goals: Return home with outpatient referrals for mental health follow-up including medication management/psychotherapy Dispo: Home to apartment with daughter ACTT referral per CSW Family meeting on 11/11 at 2 PM  Signed: Merrily Brittle, DO Psychiatry Resident, PGY-1 [facility] 12/13/2020, 8:25 AM

## 2020-12-13 NOTE — [facility] Group Notes (Signed)
Relaxation and Psycho-ed Group were combined. Healthy coping skills were discussed as it attributes to promoting mental well being. Discussed with patients that mental health journey is like a pie, and that healthy coping skills add value to mental well being. Identified unhealthy coping skills and how they play negative impact, and identified positive healthy coping skills that are free, accessible and easily implemented. Closed group with deep breathing practice and education regarding stress reduction. Pt attended, was off topic somewhat in sharing.

## 2020-12-13 NOTE — Progress Notes (Signed)
   12/13/20 1900  Psych Admission Type (Psych Patients Only)  Admission Status Involuntary  Psychosocial Assessment  Patient Complaints Anxiety  Eye Contact Fair  Facial Expression Animated;Anxious  Affect Anxious;Appropriate to circumstance  Speech Logical/coherent  Interaction Assertive  Motor Activity Restless;Slow  Appearance/Hygiene Unremarkable  Behavior Characteristics Cooperative  Mood Anxious;Pleasant  Aggressive Behavior  Effect No apparent injury  Thought Process  Coherency WDL  Content WDL  Delusions None reported or observed  Perception WDL  Hallucination None reported or observed  Judgment WDL  Confusion None  Danger to Self  Current suicidal ideation? Denies  Danger to Others  Danger to Others None reported or observed

## 2020-12-14 DIAGNOSIS — F312 Bipolar disorder, current episode manic severe with psychotic features: Secondary | ICD-10-CM | POA: Diagnosis not present

## 2020-12-14 LAB — CBC WITH DIFFERENTIAL/PLATELET
Abs Immature Granulocytes: 0.02 10*3/uL (ref 0.00–0.07)
Basophils Absolute: 0 10*3/uL (ref 0.0–0.1)
Basophils Relative: 0 %
Eosinophils Absolute: 0.1 10*3/uL (ref 0.0–0.5)
Eosinophils Relative: 1 %
HCT: 37.8 % (ref 36.0–46.0)
Hemoglobin: 12.2 g/dL (ref 12.0–15.0)
Immature Granulocytes: 0 %
Lymphocytes Relative: 39 %
Lymphs Abs: 4 10*3/uL (ref 0.7–4.0)
MCH: 28.2 pg (ref 26.0–34.0)
MCHC: 32.3 g/dL (ref 30.0–36.0)
MCV: 87.5 fL (ref 80.0–100.0)
Monocytes Absolute: 0.7 10*3/uL (ref 0.1–1.0)
Monocytes Relative: 7 %
Neutro Abs: 5.5 10*3/uL (ref 1.7–7.7)
Neutrophils Relative %: 53 %
Platelets: 379 10*3/uL (ref 150–400)
RBC: 4.32 MIL/uL (ref 3.87–5.11)
RDW: 13.2 % (ref 11.5–15.5)
WBC: 10.4 10*3/uL (ref 4.0–10.5)
nRBC: 0 % (ref 0.0–0.2)

## 2020-12-14 LAB — COMPREHENSIVE METABOLIC PANEL
ALT: 17 U/L (ref 0–44)
AST: 14 U/L — ABNORMAL LOW (ref 15–41)
Albumin: 3.9 g/dL (ref 3.5–5.0)
Alkaline Phosphatase: 54 U/L (ref 38–126)
Anion gap: 9 (ref 5–15)
BUN: 16 mg/dL (ref 6–20)
CO2: 27 mmol/L (ref 22–32)
Calcium: 9.1 mg/dL (ref 8.9–10.3)
Chloride: 103 mmol/L (ref 98–111)
Creatinine, Ser: 0.78 mg/dL (ref 0.44–1.00)
GFR, Estimated: >60 mL/min (ref >60)
Glucose, Bld: 103 mg/dL — ABNORMAL HIGH (ref 70–99)
Potassium: 3.8 mmol/L (ref 3.5–5.1)
Sodium: 139 mmol/L (ref 135–145)
Total Bilirubin: 0.4 mg/dL (ref 0.3–1.2)
Total Protein: 7 g/dL (ref 6.5–8.1)

## 2020-12-14 MED ORDER — RISPERIDONE MICROSPHERES ER 25 MG IM SRER
25.0000 mg | INTRAMUSCULAR | Status: DC
  Administered 2020-12-14: 25 mg via INTRAMUSCULAR

## 2020-12-14 MED ORDER — RISPERIDONE MICROSPHERES ER 25 MG IM SRER
25.0000 mg | INTRAMUSCULAR | Status: DC
  Filled 2020-12-14: qty 2

## 2020-12-14 NOTE — Progress Notes (Signed)
Patient presents pleasant and cooperative. Medication compliant. Did receive long acting injectable this shift. Tolerated well. Patient looking forward to discharge on tomorrow and moving forward with wedding plans. Denies any SI, HI, AVH but endorses mild anxiety. Patient visible in milieu interacting appropriately with peers. Did attend group.  Encouragement and support provided. Safety checks maintained. Medications given as prescribed. Patient receptive and remains safe on unit with q 15 min checks.

## 2020-12-14 NOTE — [facility] Group Notes (Signed)
Pt did not attend relaxation group.  

## 2020-12-14 NOTE — [facility] Group Notes (Signed)
PsychoEducational Group- Self Care Toolkit and Sleep Hygiene were discussed in group. The patients were asked to identify healthy and unhealthy behaviors as it relates to sleep hygiene. Patients were also asked to identify simple was to improve self care, identifying a gratitude list, establishing healthy boundaries and recognizing the impact of unhealthy boundaries as it impacts mental health. Pt participated and was appropriate but thoughts shared were a bit off topic sometimes.
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
## 2020-12-14 NOTE — Progress Notes (Addendum)
[facility] MD Progress Note  12/14/2020 5:33 PM Veronica Perez  MRN:  [id number]  Subjective:  Veronica Perez is a 45 y.o. female  with history of bipolar affective disorder presenting involuntarily from [facility] due to worsening anxiety, paranoia, and self-neglect, physical aggression and HI, then admitted to [facility] for management of mania. BHH Day 18     Overnight Events: Slept Number of Hours: 6.75  Patient was compliant with scheduled meds, required no agitation PRN, attended group therapy, no behavioral concerns per notes.  Interim History: Veronica Perez was evaluated this AM with attending Dr. Nelda Marseille. Patient was initially seen walking around the unit. Patient reported that her sleep, mood and appetite are good.  She denied any side effects from her medications.  Also denied racing thoughts, feeling overly excitable, or other symptoms of mania.  Stated that she is doing well, and that she wants to go home. Patient denied SI/HI/AVH, delusions, paranoia, first rank symptoms, and contracted for safety on the unit. Patient was not grossly responding to internal/external stimuli nor made any delusional statements during encounter. She voices no physical complaints.  Family Meeting:  During family meeting with patient and her parents, discussed her hospitalization, diagnoses, current medicines, symptoms and plans moving forward.  Parents also stated that patient sounds great, and has returned to baseline.  Parents report they are prepared for her to be discharged.  During the conversation, patient was amenable to Risperdal Consta, however she is apprehensive and worried about side effects.  Stated that she has been tolerating medicine PO, that we do not expect there to be worrisome side effects, also if there are any side effects, we expect them to be mild and transient.  There may be some localized swelling at the site of injection, that will go away.  Family  and patient said they understand, and are amenable to the plan. Will order Risperdal consta 25mg  IM with plans for ACTT to continue this every 2 weeks after discharge.   Principal Problem: Bipolar I disorder, current or most recent episode manic, with psychotic features ([place]) Diagnosis: Principal Problem:   Bipolar I disorder, current or most recent episode manic, with psychotic features ([place]) Active Problems:   PTSD (post-traumatic stress disorder)   Catatonia  Total Time spent with patient: I personally spent 35 minutes on the unit in direct patient care. The direct patient care time included face-to-face time with the patient, reviewing the patient's chart, communicating with other professionals, and coordinating care. Greater than 50% of this time was spent in counseling or coordinating care with the patient regarding goals of hospitalization, psycho-education, and discharge planning needs.  Past Psychiatric History: see H&P  Past Medical History:  Past Medical History:  Diagnosis Date   Bipolar disorder ([place])    Depression    Former smoker, stopped smoking many years ago    HSV (herpes simplex virus) infection    Hypothyroid    Low TSH level 05/30/2016   Panic attacks    Thyroid disease     Past Surgical History:  Procedure Laterality Date   ADENOIDECTOMY     OVARIAN CYST SURGERY     TONSILLECTOMY     Family History:  Family History  Problem Relation Age of Onset   Diabetes Father    Heart disease Father    Cancer Maternal Grandmother        breast cancer   Bipolar disorder Mother    Thyroid disease Neg Hx  Family Psychiatric  History: see H&P  Social History:  Social History   Substance and Sexual Activity  Alcohol Use Not Currently   Comment: denies     Social History   Substance and Sexual Activity  Drug Use No    Social History   Socioeconomic History   Marital status: Single    Spouse name: Not on file   Number of children: 1   Years of  education: Not on file   Highest education level: High school graduate  Occupational History   Not on file  Tobacco Use   Smoking status: Every Day    Packs/day: 25.00    Types: Cigarettes   Smokeless tobacco: Never  Vaping Use   Vaping Use: Never used  Substance and Sexual Activity   Alcohol use: Not Currently    Comment: denies   Drug use: No   Sexual activity: Yes    Birth control/protection: None  Other Topics Concern   Not on file  Social History Narrative   Not on file   Social Determinants of Health   Financial Resource Strain: Not on file  Food Insecurity: Not on file  Transportation Needs: Not on file  Physical Activity: Not on file  Stress: Not on file  Social Connections: Not on file   Additional Social History:  Patient lives at home with daughter near her parents.  Sleep: Good  Appetite:  Good  Current Medications: Current Facility-Administered Medications  Medication Dose Route Frequency Provider Last Rate Last Admin   acetaminophen (TYLENOL) tablet 650 mg  650 mg Oral Q6H PRN Suella Broad, FNP   650 mg at 12/07/20 2148   alum & mag hydroxide-simeth (MAALOX/MYLANTA) 200-200-20 MG/5ML suspension 30 mL  30 mL Oral Q4H PRN Suella Broad, FNP       benztropine (COGENTIN) tablet 0.5 mg  0.5 mg Oral BID PRN Viann Fish E, MD   0.5 mg at 12/09/20 2158   docusate sodium (COLACE) capsule 100 mg  100 mg Oral Daily France Ravens, MD   100 mg at 12/14/20 0834   LORazepam (ATIVAN) injection 1 mg  1 mg Intramuscular Q6H PRN Harlow Asa, MD       OLANZapine zydis (ZYPREXA) disintegrating tablet 10 mg  10 mg Oral Q8H PRN France Ravens, MD       And   LORazepam (ATIVAN) tablet 1 mg  1 mg Oral PRN France Ravens, MD       And   ziprasidone (GEODON) injection 20 mg  20 mg Intramuscular PRN France Ravens, MD       magnesium hydroxide (MILK OF MAGNESIA) suspension 30 mL  30 mL Oral Daily PRN Suella Broad, FNP   30 mL at 12/12/20 0835   melatonin  tablet 3 mg  3 mg Oral QPC supper Merrily Brittle, DO   3 mg at 12/13/20 1820   multivitamins with iron tablet 1 tablet  1 tablet Oral Daily Merrily Brittle, DO   1 tablet at 12/14/20 0834   propranolol (INDERAL) tablet 10 mg  10 mg Oral TID Harlow Asa, MD   10 mg at 12/14/20 1732   risperiDONE (RISPERDAL M-TABS) disintegrating tablet 1 mg  1 mg Oral Daily Hill, Jackie Plum, MD   1 mg at 12/14/20 0835   risperiDONE (RISPERDAL M-TABS) disintegrating tablet 2 mg  2 mg Oral QHS Merrily Brittle, DO   2 mg at 12/13/20 2102   risperiDONE microspheres (RISPERDAL CONSTA) injection 25 mg  25  mg Intramuscular Q14 Days Merrily Brittle, DO        Lab Results: No results found for this or any previous visit (from the past 34 hour(s)).  Blood Alcohol level:  Lab Results  Component Value Date   ETH <10 11/25/2020   ETH <10 43/32/9518    Metabolic Disorder Labs: Lab Results  Component Value Date   HGBA1C 5.0 11/28/2020   MPG 96.8 11/28/2020   MPG 96.8 06/28/2019   Lab Results  Component Value Date   PROLACTIN 13.1 06/28/2019   PROLACTIN 69.3 (H) 09/17/2016   Lab Results  Component Value Date   CHOL 141 11/28/2020   TRIG 214 (H) 11/28/2020   HDL 40 (L) 11/28/2020   CHOLHDL 3.5 11/28/2020   VLDL 43 (H) 11/28/2020   LDLCALC 58 11/28/2020   LDLCALC 103 (H) 06/28/2019    Physical Findings: AIMS: Facial and Oral Movements Muscles of Facial Expression: None, normal Lips and Perioral Area: None, normal Jaw: None, normal Tongue: None, normal,Extremity Movements Upper (arms, wrists, hands, fingers): None, normal Lower (legs, knees, ankles, toes): None, normal, Trunk Movements Neck, shoulders, hips: None, normal, Overall Severity Severity of abnormal movements (highest score from questions above): None, normal Incapacitation due to abnormal movements: None, normal Patient's awareness of abnormal movements (rate only patient's report): No Awareness, Dental Status Current problems with teeth  and/or dentures?: No Does patient usually wear dentures?: No   Musculoskeletal: Strength & Muscle Tone: within normal limits Gait & Station: normal Patient leans: N/A  Psychiatric Specialty Exam:  Presentation  General Appearance: wearing makeup and hair in pig tails, adequate hygiene  Eye Contact:Good  Speech: regular rate and fluency - no longer rambling or rapid  Speech Volume:Normal  Handedness:Right   Mood and Affect  Mood: calmer - described as good - appears more euthymic  Affect: child-like at times but polite, calm, bright - no longer irritable   Thought Process  Thought Processes:Concrete, ruminative about discharge plans   Orientation:Full (Time, Place and Person)  Thought Content: Denies AVH, paranoia, ideas of reference, or first rank symptoms - is not grossly responding to internal/external stimuli on exam; mildly guarded when discussing LAI; does not make delusional statements today  History of Schizophrenia/Schizoaffective disorder:Yes  Duration of Psychotic Symptoms:Greater than six months  Hallucinations: Denied Ideas of Reference:None  Suicidal Thoughts: Denied Homicidal Thoughts: Denied  Sensorium  Memory:Immediate Fair; Recent Fair; Remote Poor  Judgment: Fair - agrees to LAI  Insight: Lacking   Community education officer  Concentration: Fair   Attention Span: Fair   Recall: [employer] of Knowledge: Fair  Language: Good   Psychomotor Activity  Psychomotor Activity: Normal  Assets  Assets:Housing; Leisure Time; Social Support  Sleep  Sleep: Fair Slept Number of Hours: 6.75   Physical Exam Vitals and nursing note reviewed.  Constitutional:      General: She is not in acute distress.    Appearance: She is not ill-appearing, toxic-appearing or diaphoretic.  HENT:     Head: Normocephalic.  Pulmonary:     Effort: Pulmonary effort is normal.  Neurological:     General: No focal deficit present.     Mental Status: She is  alert. Gait is steady  Review of Systems  Constitutional:  Negative for chills and fever.  Respiratory:  Negative for shortness of breath.   Cardiovascular:  Negative for chest pain.  Gastrointestinal:  Negative for constipation, nausea and vomiting.  Neurological:  Negative for headaches.  Blood pressure 122/73, pulse 73, temperature (!)  97.5 F (36.4 C), temperature source Oral, resp. rate 18, height 5\' 3"  (1.6 m), weight 76.2 kg, SpO2 99 %. Body mass index is 29.76 kg/m.   Treatment Plan Summary: Daily contact with patient to assess and evaluate symptoms and progress in treatment and Medication management  Principal Problem:   Bipolar I disorder, current or most recent episode manic, with psychotic features ([place]) Active Problems:   PTSD (post-traumatic stress disorder)   Catatonia - resolved   PLAN Bipolar I manic with psychotic features (r/o schizoaffective d/o bipolar type) CSW assisted with ACTT appointment  Continue agitation protocol PRN Continue Cogentin 0.5mg  bid PRN for EPS Continue Melatonin 3mg  qhs for insomnia Continue propranolol 10 mg TID daily for help with restlessness Continue Risperdal to 1 mg qAM + 2 mg qPM for 4wks as an oral bridge with start of Risperdal Consta 25mg  IM Repeat QTc 417 on 11/9 A1c 5.0, Triglycerides 214, HDL 40, cholesterol 141, LDL 58 Risperdal Consta 25mg  IM q14days (11/11) Rechecking CBC for WBC monitoring prior to discharge   Catatonia, resolved Ativan taper completed   Multinodular goiter/hyperthyroidism Patient continues to refuse medication for hyperthyroidism  Continue Inderal 10mg  TID for help with restlessness We will schedule patient up for outpatient follow-up   72mm right adrenal adenoma noted on CT scan 10/19/20 We will schedule patient up for outpatient follow-up  Elevated triglycerides Will need PCP f/u after discharge for fasting lab recheck  Hypokalemia - Rechecking CMP tonight   3. Safety and  Monitoring: Involuntary admission to inpatient psychiatric unit for safety, stabilization and treatment Daily contact with patient to assess and evaluate symptoms and progress in treatment Patient's case to be discussed in multi-disciplinary team meeting Observation Level : q15 minute checks Vital signs: q12 hours Precautions: suicide, elopement, and assault   4. Discharge Planning: Social work and case management to assist with discharge planning and identification of hospital follow-up needs prior to discharge Discharge Concerns: Need to establish a safety plan; Medication compliance and effectiveness Discharge Goals: Return home with outpatient referrals for mental health follow-up including medication management/psychotherapy Dispo: Home to apartment with daughter ACTT referral per CSW Family meeting on 11/11 at 2 PM  Signed: Merrily Brittle, DO Psychiatry Resident, PGY-1 [facility] 12/14/2020, 5:33 PM
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
## 2020-12-14 NOTE — BHH Group Notes (Signed)
Adult Psychoeducational Group Note  Date:  12/14/2020 Time:  9:48 PM  Group Topic/Focus:  Wrap-Up Group:   The focus of this group is to help patients review their daily goal of treatment and discuss progress on daily workbooks.  Participation Level:  Active  Participation Quality:  Attentive  Affect:  Anxious  Cognitive:  Alert  Insight: Improving  Engagement in Group:  Engaged  Modes of Intervention:  Discussion, Education, and Support  Additional Comments: pt rated day 10/10. She shared that she was able to play basketball a little and exercise. Pt also shared that some things that are experienced in life are traumatizing and those things have to be released in order to heal from them.  Herbert Seta Cassandra 12/14/2020, 9:48 PM

## 2020-12-14 NOTE — Progress Notes (Signed)
   12/13/20 1900  Psych Admission Type (Psych Patients Only)  Admission Status Involuntary  Psychosocial Assessment  Patient Complaints None  Eye Contact Fair  Facial Expression Animated  Affect Appropriate to circumstance  Speech Logical/coherent  Interaction Assertive  Motor Activity Slow  Appearance/Hygiene Unremarkable  Behavior Characteristics Cooperative  Mood Anxious;Pleasant  Aggressive Behavior  Effect No apparent injury  Thought Process  Coherency WDL  Content WDL  Delusions None reported or observed  Perception WDL  Hallucination None reported or observed  Judgment WDL  Confusion None  Danger to Self  Current suicidal ideation? Denies  Danger to Others  Danger to Others None reported or observed  Patient compliant with medication Denies SI/HI/A/VH and verbally contracted for safety. Support and encouragement provided. Will continue to monitor.

## 2020-12-14 NOTE — Progress Notes (Signed)
   12/14/20 1611  Vital Signs  Pulse Rate 73  BP 122/73  BP Method Automatic   D: Patient denies SI/HI/AVH. Patient out in open areas and was on the phone.  A:  Patient took scheduled medicine.  Support and encouragement provided Routine safety checks conducted every 15 minutes. Patient  Informed to notify staff with any concerns.   R:  Safety maintained.

## 2020-12-14 NOTE — Plan of Care (Signed)
  Problem: Self-Concept: Goal: Level of anxiety will decrease Outcome: Progressing   Problem: Education: Goal: Emotional status will improve Outcome: Progressing Goal: Mental status will improve Outcome: Progressing Goal: Verbalization of understanding the information provided will improve Outcome: Progressing   Problem: Safety: Goal: Periods of time without injury will increase Outcome: Progressing

## 2020-12-14 NOTE — BHH Group Notes (Signed)
Patient attended music therapy group and was active in discussion of using music as a coping skill.  

## 2020-12-14 NOTE — Discharge Instructions (Signed)
Dear Ms. Govan, I am so glad you are feeling better and can be discharged today (12/14/2020)! You were admitted because of confusion, anxiety and restlessness (your symptoms on mania) from your bipolar type 1 disorder. This happens when you do not take your antipsychotics every day as directed.  Please see the following instructions: Please see your primary care / family doctor within 1 WEEK of leaving the hospital for a [place]. Please go to the appointments that your social worker team has set up for you. Bipolar type 1 disorder If you CONTINUE the shot that you get every TWO weeks: Risperdal 1mg  every morning + Risperdal 2mg  every night for FOUR weeks (end date 01/11/2021) Risperdal consta 25mg  every TWO weeks (shot in your arm) First shot on 12/14/2020. Next shot DUE 12/28/2020. If you do not like the shot: Risperdal 1mg  every morning + Risperdal 2mg  every night Bipolar disorder does not go away, and will need a medicine every day to keep it from making you feel bad and confused which gets you into the hospital. Hyperthyroid Propranolol 10mg  THREE times a day to help with anxiety from hyperthyroidism See you primary care provider for permanent treatment.  It was a pleasure meeting you, Ms. Reach.  I wish you and your family the best, and hope you stay happy and healthy!  Merrily Brittle, DO 12/14/2020

## 2020-12-14 NOTE — Group Note (Signed)
LCSW Group Therapy Note   Group Date: 12/14/2020 Start Time: 1300 End Time: 1400   Type of Therapy and Topic:  Group Therapy: Coping Skills  Participation Level:  Active  Due to the acuity and complex discharge plans, group was not held. Patient was provided therapeutic worksheets and asked to meet with CSW as needed.  Darleen Crocker, LCSWA 12/14/2020  2:27 PM

## 2020-12-14 NOTE — Group Note (Signed)
Recreation Therapy Group Note   Group Topic:Stress Management  Group Date: 12/14/2020 Start Time: 0930 End Time: 0950 Facilitators: Victorino Sparrow, LRT,CTRS Location: [address] Dayroom   Goal Area(s) Addresses:  Patient will actively participate in stress management techniques presented during session.  Patient will successfully identify benefit of practicing stress management post d/c.   Group Description: Guided Imagery. LRT provided education, instruction, and demonstration on practice of visualization via guided imagery. Patient was asked to participate in the technique introduced during session. LRT debriefed including topics of mindfulness, stress management and specific scenarios each patient could use these techniques. Patients were given suggestions of ways to access scripts post d/c and encouraged to explore Youtube and other apps available on smartphones, tablets, and computers.   Affect/Mood: N/A   Participation Level: Did not attend    Clinical Observations/Individualized Feedback: Pt did not attend group.    Plan: Continue to engage patient in RT group sessions 2-3x/week.   Victorino Sparrow, LRT,CTRS 12/14/2020 11:10 AM

## 2020-12-15 DIAGNOSIS — F312 Bipolar disorder, current episode manic severe with psychotic features: Secondary | ICD-10-CM | POA: Diagnosis not present

## 2020-12-15 MED ORDER — MELATONIN 3 MG PO TABS: 3.0000 mg | ORAL_TABLET | Freq: Every day | ORAL | 0 refills | Status: DC

## 2020-12-15 MED ORDER — RISPERIDONE 1 MG PO TABS: 1.0000 mg | ORAL_TABLET | Freq: Every morning | ORAL | 0 refills | Status: DC

## 2020-12-15 MED ORDER — TAB-A-VITE/IRON PO TABS: 1.0000 | ORAL_TABLET | Freq: Every day | ORAL | 0 refills | Status: DC

## 2020-12-15 MED ORDER — PROPRANOLOL HCL 10 MG PO TABS: 10.0000 mg | ORAL_TABLET | Freq: Three times a day (TID) | ORAL | 0 refills | Status: DC

## 2020-12-15 MED ORDER — RISPERIDONE MICROSPHERES ER 25 MG IM SRER: 25.0000 mg | INTRAMUSCULAR | 0 refills | Status: DC

## 2020-12-15 MED ORDER — RISPERIDONE 2 MG PO TABS: 2.0000 mg | ORAL_TABLET | Freq: Every evening | ORAL | 0 refills | Status: DC

## 2020-12-15 NOTE — Group Note (Signed)
Date:  12/15/2020 Time:  10:54 AM  Number of Participants: 10  Group Focus: daily focus Treatment Modality:  Psychoeducation Interventions utilized were group exercise Purpose: express feelings and increase insight   Participation Level: Pt attended psycho-ed group with RN.

## 2020-12-15 NOTE — Progress Notes (Signed)
  [facility] Adult Case Management Discharge Plan :  Will you be returning to the same living situation after discharge:  Yes,  will visit parents and return to her apartment At discharge, do you have transportation home?: Yes,  family Do you have the ability to pay for your medications: Yes,  insurance  Release of information consent forms completed and emailed to Medical Records, then turned in to Medical Records by CSW.    Patient to Follow up at:  Follow-up [place]. Go on 12/18/2020.   Specialty: Behavioral Health Why: You  have an appointment with Dr. Reece Levy on 12/18/20 at 3:10 pm for medication management. You also have an appointment for therapy services on 01/30/21 at 12:00 pm  with Remo Lipps.  These appointments will be held in person. Contact information: [place] [address] [phone]         [facility]. Go on 12/20/2020.   Why: You have an intake appointment for ACTT services on Thursday, 12/20/2020 at 10:00AM. Contact information: [address] [place] [address] [phone]         [facility]. Go on 01/14/2021.   Why: You have a hospital follow up appointment to establish care for primary care services on 01/14/21 at 2:15 pm.   This appointment will be held in person. Contact information: [place] [id number] [place]. Go on 12/26/2020.   Specialty: Behavioral Health Why: You have an interim appointment for therapy services on 12/26/20 at 9:20 am.  This appointment will be held in person. Contact information: [place] [phone] [place] [phone]                Next level of care provider has access to [place] and Suicide Prevention discussed: Yes,   mother     Has patient been referred to the Quitline?: N/A patient is not a smoker  Patient has been referred for addiction treatment: N/A  Maretta Los, LCSW 12/15/2020, 9:53 AM

## 2020-12-15 NOTE — Group Note (Signed)
Date:  12/15/2020 Time:  9:36 AM  Group Topic/Focus:  Goals Group:   The focus of this group is to help patients establish daily goals to achieve during treatment and discuss how the patient can incorporate goal setting into their daily lives to aide in recovery. Orientation:   The focus of this group is to educate the patient on the purpose and policies of crisis stabilization and provide a format to answer questions about their admission.  The group details unit policies and expectations of patients while admitted.    Participation Level:  Active  Participation Quality:  Appropriate and Attentive  Affect:  Appropriate  Cognitive:  Alert and Appropriate  Insight: Appropriate and Good  Engagement in Group:  Engaged  Modes of Intervention:  Discussion  Additional Comments:  Pt says they are nervous for D/C. Pt will work on her diet plan as a long term goal once she gets home.   Tyrell Antonio Sheina Mcleish 12/15/2020, 9:36 AM

## 2020-12-15 NOTE — Progress Notes (Signed)
Discharge Note- Patient presents with anxious mood, affect congruent. She continues to show extremely concrete thinking, with a lot of anxiety and focus around cleaning her house. Pt appears at her baseline, no signs of acute decompensation noted, however pt does show Limited insight and ability to problem solve. Hanifah states she feels better and denies any SI/HI or any auditory visual hallucinations. She states she feels ready for discharge and is looking forward to continuing her self care journey. AVS discharge instructions reviewed with patient and her daughter at length. All follow up appointments and crisis interventions reviewed.  All belongings returned.  Pt family reviewed AVS printed copy as well. Patient escorted from the unit to the lobby to the care of her adult daughter.

## 2020-12-26 ENCOUNTER — Ambulatory Visit (INDEPENDENT_AMBULATORY_CARE_PROVIDER_SITE_OTHER): Payer: 59 | Admitting: Clinical

## 2020-12-26 ENCOUNTER — Other Ambulatory Visit: Payer: Self-pay

## 2020-12-26 DIAGNOSIS — F312 Bipolar disorder, current episode manic severe with psychotic features: Secondary | ICD-10-CM

## 2020-12-26 NOTE — Plan of Care (Signed)
Pt will learn and implement stress management techniques 3/7 days per week to improve her mental well being and cope with life stressors. Pt will attend outpatient therapy and medication management appointments regularly. Pt will reduce frequency and intensity of mood episodes to improve quality of life 3/7 days per week. Pt participated in completion of treatment plan.
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
## 2021-01-08 ENCOUNTER — Ambulatory Visit (INDEPENDENT_AMBULATORY_CARE_PROVIDER_SITE_OTHER): Payer: 59 | Admitting: Clinical

## 2021-01-08 ENCOUNTER — Other Ambulatory Visit: Payer: Self-pay

## 2021-01-08 DIAGNOSIS — F312 Bipolar disorder, current episode manic severe with psychotic features: Secondary | ICD-10-CM

## 2021-01-08 NOTE — Progress Notes (Signed)
   THERAPIST PROGRESS NOTE  Session Time: 3pm  Participation Level: Active  Behavioral Response: Anxious tangential, concrete/irrelevant  Type of Therapy: Individual Therapy  Treatment Goals addressed: Coping  Interventions: Supportive Virtual Visit via Telephone Note  I connected with [name] on 01/08/21 at  3:00 PM EST by telephone and verified that I am speaking with the correct person using two identifiers.  Location: Patient: home Provider: office   I discussed the limitations, risks, security and privacy concerns of performing an evaluation and management service by telephone and the availability of in person appointments. I also discussed with the patient that there may be a patient responsible charge related to this service. The patient expressed understanding and agreed to proceed.   I discussed the assessment and treatment plan with the patient. The patient was provided an opportunity to ask questions and all were answered. The patient agreed with the plan and demonstrated an understanding of the instructions.   The patient was advised to call back or seek an in-person evaluation if the symptoms worsen or if the condition fails to improve as anticipated.  I provided 40 minutes of non-face-to-face time during this encounter.  Summary: Pt displays tangential thought process, pressured speech requiring a great deal or redirection. On 11/15, pt says Dr. Reece Levy stopped her risperidone injection and reports receiving risperidone tablets. According to pt, since taking risperidone she has been experiencing bladder and kidney problems. Per pt report she urinates with any bladder pressure. Pt scheduled for f/u with Dr Reece Levy on 12/28. Pt states she has not started receiving ACTT with [facility] but last spoke with them on 12/2. Pt says she is waiting to be scheduled for follow up appointment. Pt states she does not want to chang psychiatrist but is open to receiving ACTT  service. Per pt report, she needs to take lorzepam prescription to calm her down. Pt states she exercises to help with this. Suicidal/Homicidal: Pt denies SI/HI, no plan, intent or attempt to harm self or others reported. Pt says she is "thinking clearly" after receiving adequate rest.  Therapist Response: Today's session consisted of addressing pt questions about ACTT and providing her with information on her scheduled f/u appt with PCP, ACTT, psychiatrist. Discussed with pt coping strategies to assist with managing restlessness and anxiety inducing situations.  Plan: Return again in 2 weeks.  Diagnosis: Axis I: Bipolar I disorder, most recent episode manic with psychotic features    Axis II: No diagnosis    Yvette Rack, LCSW 01/08/2021

## 2021-01-14 ENCOUNTER — Inpatient Hospital Stay: Payer: 59 | Admitting: Nurse Practitioner

## 2021-01-22 ENCOUNTER — Ambulatory Visit (INDEPENDENT_AMBULATORY_CARE_PROVIDER_SITE_OTHER): Payer: 59 | Admitting: Clinical

## 2021-01-22 ENCOUNTER — Other Ambulatory Visit: Payer: Self-pay

## 2021-01-22 DIAGNOSIS — F312 Bipolar disorder, current episode manic severe with psychotic features: Secondary | ICD-10-CM | POA: Diagnosis not present

## 2021-01-23 NOTE — Progress Notes (Signed)
° °  THERAPIST PROGRESS NOTE  Session Time: 3pm  Participation Level: Active  Behavioral Response: Alert  Type of Therapy: Individual Therapy  Treatment Goals addressed: Coping  Interventions: Supportive Virtual Visit via Telephone Note  I connected with [name] on 01/23/21 at  3:00 PM EST by telephone and verified that I am speaking with the correct person using two identifiers.  Location: Patient: home Provider: office   I discussed the limitations, risks, security and privacy concerns of performing an evaluation and management service by telephone and the availability of in person appointments. I also discussed with the patient that there may be a patient responsible charge related to this service. The patient expressed understanding and agreed to proceed.   I discussed the assessment and treatment plan with the patient. The patient was provided an opportunity to ask questions and all were answered. The patient agreed with the plan and demonstrated an understanding of the instructions.   The patient was advised to call back or seek an in-person evaluation if the symptoms worsen or if the condition fails to improve as anticipated.  I provided 35 minutes of non-face-to-face time during this encounter.  Summary: Pt presents with pressured speech, tangential thought process and required great deal of redirection. When asked if she has met with or spoken with [facility], pt states "All I need is sleep, I dont need any help" Pt states she is unsure when [facility] plans to schedule follow up appointment. Pt says she has been receiving adequate rest, sleeping 8 hrs per night. Pt expressed concerns about noise complaints in her neighborhood. Pt describes "cars driving by with those booming systems" According to pt, the loud noise is affecting her physical health but says police have been notified and are monitoring the neighborhood. Per pt report, she is prescribed risperidone 3mg   tablet at night. Pt states she has follow up appt with Dr. Reece Levy at [facility] on 12/23. Suicidal/Homicidal: Pt denies SI/HI, no plan, intent or attempt to harm self or others reported. Pt says she is "thinking clearly" after receiving adequate rest.  Therapist Response: Addressed pt questions about [facility]. Requested pt sign consent forms Database administrator permission to collaborate with psychiatrist and follow up on referral submitted to [facility] during most recent discharge from hospital. Pt says she is agreeable to do so.  Plan: Return again in 2 weeks.  Diagnosis: Axis I: Bipolar I disorder, most recent episode manic with psychotic features    Axis II: No diagnosis    Yvette Rack, LCSW 01/23/2021

## 2021-01-24 ENCOUNTER — Telehealth (HOSPITAL_COMMUNITY): Payer: Self-pay | Admitting: Clinical

## 2021-02-07 ENCOUNTER — Other Ambulatory Visit (HOSPITAL_COMMUNITY): Payer: Self-pay | Admitting: Internal Medicine

## 2021-02-07 ENCOUNTER — Other Ambulatory Visit: Payer: Self-pay | Admitting: Internal Medicine

## 2021-02-07 DIAGNOSIS — E0500 Thyrotoxicosis with diffuse goiter without thyrotoxic crisis or storm: Secondary | ICD-10-CM

## 2021-02-07 DIAGNOSIS — E0590 Thyrotoxicosis, unspecified without thyrotoxic crisis or storm: Secondary | ICD-10-CM

## 2021-02-25 ENCOUNTER — Encounter (HOSPITAL_COMMUNITY): Payer: Self-pay

## 2021-02-25 ENCOUNTER — Encounter (HOSPITAL_COMMUNITY): Payer: 59

## 2021-02-25 ENCOUNTER — Encounter (HOSPITAL_COMMUNITY): Payer: 59 | Attending: Internal Medicine

## 2021-02-26 ENCOUNTER — Encounter (HOSPITAL_COMMUNITY): Payer: 59

## 2021-09-30 DIAGNOSIS — E7800 Pure hypercholesterolemia, unspecified: Secondary | ICD-10-CM | POA: Diagnosis not present

## 2021-09-30 DIAGNOSIS — Z79899 Other long term (current) drug therapy: Secondary | ICD-10-CM | POA: Diagnosis not present

## 2021-09-30 DIAGNOSIS — F17210 Nicotine dependence, cigarettes, uncomplicated: Secondary | ICD-10-CM | POA: Diagnosis not present

## 2021-09-30 DIAGNOSIS — R5383 Other fatigue: Secondary | ICD-10-CM | POA: Diagnosis not present

## 2021-09-30 DIAGNOSIS — Z0000 Encounter for general adult medical examination without abnormal findings: Secondary | ICD-10-CM | POA: Diagnosis not present

## 2021-09-30 DIAGNOSIS — E559 Vitamin D deficiency, unspecified: Secondary | ICD-10-CM | POA: Diagnosis not present

## 2021-10-14 DIAGNOSIS — E7800 Pure hypercholesterolemia, unspecified: Secondary | ICD-10-CM | POA: Diagnosis not present

## 2021-10-14 DIAGNOSIS — R030 Elevated blood-pressure reading, without diagnosis of hypertension: Secondary | ICD-10-CM | POA: Diagnosis not present

## 2021-10-14 DIAGNOSIS — E079 Disorder of thyroid, unspecified: Secondary | ICD-10-CM | POA: Diagnosis not present

## 2021-10-14 DIAGNOSIS — E559 Vitamin D deficiency, unspecified: Secondary | ICD-10-CM | POA: Diagnosis not present

## 2021-10-14 DIAGNOSIS — F32A Depression, unspecified: Secondary | ICD-10-CM | POA: Diagnosis not present

## 2021-10-22 DIAGNOSIS — E7800 Pure hypercholesterolemia, unspecified: Secondary | ICD-10-CM | POA: Diagnosis not present

## 2021-10-22 DIAGNOSIS — F32A Depression, unspecified: Secondary | ICD-10-CM | POA: Diagnosis not present

## 2021-10-22 DIAGNOSIS — E079 Disorder of thyroid, unspecified: Secondary | ICD-10-CM | POA: Diagnosis not present

## 2021-10-22 DIAGNOSIS — R030 Elevated blood-pressure reading, without diagnosis of hypertension: Secondary | ICD-10-CM | POA: Diagnosis not present

## 2021-10-22 DIAGNOSIS — E559 Vitamin D deficiency, unspecified: Secondary | ICD-10-CM | POA: Diagnosis not present

## 2021-10-25 ENCOUNTER — Other Ambulatory Visit: Payer: Self-pay | Admitting: Internal Medicine

## 2021-10-30 ENCOUNTER — Encounter (HOSPITAL_COMMUNITY): Payer: Self-pay

## 2021-10-30 ENCOUNTER — Emergency Department (HOSPITAL_COMMUNITY)
Admission: EM | Admit: 2021-10-30 | Discharge: 2021-10-31 | Disposition: A | Discharge status: Home / Self Care | Payer: Medicare Other | Attending: Emergency Medicine | Admitting: Emergency Medicine

## 2021-10-30 ENCOUNTER — Other Ambulatory Visit: Payer: Self-pay

## 2021-10-30 DIAGNOSIS — N8003 Adenomyosis of the uterus: Secondary | ICD-10-CM

## 2021-10-30 DIAGNOSIS — N9489 Other specified conditions associated with female genital organs and menstrual cycle: Secondary | ICD-10-CM | POA: Insufficient documentation

## 2021-10-30 DIAGNOSIS — R109 Unspecified abdominal pain: Secondary | ICD-10-CM | POA: Diagnosis present

## 2021-10-30 LAB — CBC WITH DIFFERENTIAL/PLATELET
Abs Immature Granulocytes: 0.02 10*3/uL (ref 0.00–0.07)
Basophils Absolute: 0 10*3/uL (ref 0.0–0.1)
Basophils Relative: 0 %
Eosinophils Absolute: 0.3 10*3/uL (ref 0.0–0.5)
Eosinophils Relative: 3 %
HCT: 37.8 % (ref 36.0–46.0)
Hemoglobin: 12.5 g/dL (ref 12.0–15.0)
Immature Granulocytes: 0 %
Lymphocytes Relative: 29 %
Lymphs Abs: 2.8 10*3/uL (ref 0.7–4.0)
MCH: 28.2 pg (ref 26.0–34.0)
MCHC: 33.1 g/dL (ref 30.0–36.0)
MCV: 85.3 fL (ref 80.0–100.0)
Monocytes Absolute: 0.4 10*3/uL (ref 0.1–1.0)
Monocytes Relative: 4 %
Neutro Abs: 6.2 10*3/uL (ref 1.7–7.7)
Neutrophils Relative %: 64 %
Platelets: 303 10*3/uL (ref 150–400)
RBC: 4.43 MIL/uL (ref 3.87–5.11)
RDW: 12.1 % (ref 11.5–15.5)
WBC: 9.7 10*3/uL (ref 4.0–10.5)
nRBC: 0 % (ref 0.0–0.2)

## 2021-10-30 LAB — URINALYSIS, ROUTINE W REFLEX MICROSCOPIC
Bilirubin Urine: NEGATIVE
Glucose, UA: NEGATIVE mg/dL
Ketones, ur: NEGATIVE mg/dL
Leukocytes,Ua: NEGATIVE
Nitrite: NEGATIVE
Protein, ur: 30 mg/dL — AB
RBC / HPF: >50 RBC/hpf — ABNORMAL HIGH (ref 0–5)
Specific Gravity, Urine: 1.02 (ref 1.005–1.030)
pH: 5 (ref 5.0–8.0)

## 2021-10-30 LAB — COMPREHENSIVE METABOLIC PANEL
ALT: 16 U/L (ref 0–44)
AST: 18 U/L (ref 15–41)
Albumin: 3.7 g/dL (ref 3.5–5.0)
Alkaline Phosphatase: 68 U/L (ref 38–126)
Anion gap: 7 (ref 5–15)
BUN: 17 mg/dL (ref 6–20)
CO2: 23 mmol/L (ref 22–32)
Calcium: 8.7 mg/dL — ABNORMAL LOW (ref 8.9–10.3)
Chloride: 110 mmol/L (ref 98–111)
Creatinine, Ser: 0.65 mg/dL (ref 0.44–1.00)
GFR, Estimated: >60 mL/min (ref >60)
Glucose, Bld: 124 mg/dL — ABNORMAL HIGH (ref 70–99)
Potassium: 3.6 mmol/L (ref 3.5–5.1)
Sodium: 140 mmol/L (ref 135–145)
Total Bilirubin: 0.4 mg/dL (ref 0.3–1.2)
Total Protein: 7.3 g/dL (ref 6.5–8.1)

## 2021-10-30 LAB — I-STAT BETA HCG BLOOD, ED (MC, WL, AP ONLY): I-stat hCG, quantitative: <5 m[IU]/mL (ref <5)

## 2021-10-30 NOTE — ED Triage Notes (Signed)
Patient arrives with family POV. Patient and family believe she may be pregnant with twins. Patient also reports abnormal bleeding.

## 2021-10-30 NOTE — ED Provider Triage Note (Addendum)
Emergency Medicine Provider Triage Evaluation Note  Veronica Perez , a 46 y.o. female  was evaluated in triage.  Pt reports that she is currently pregnant with twins.  She reports that she had an ultrasound done at [facility] and she heard 2 heartbeats and that is how she knows that she is pregnant.  She reports that despite knowing that she has been having heavy menstrual cycles since May.  She reports no bleeding outside of her cycle but reports that the bleeding is heavier than usual and she is passing clots.  When asked if she was told at [facility] that she was pregnant patient's family member interjects stating that that does not matter because he over read the ultrasound and heard the 2 heartbeats as well.  Review of Systems  Positive: Vaginal bleeding, intermittent pelvic cramping Negative: Syncope, chest pain, shortness of breath  Physical Exam  BP 139/81 (BP Location: Left Arm)   Pulse 97   Resp 18   SpO2 97%  Gen:   Awake, no distress   Resp:  Normal effort  MSK:   Moves extremities without difficulty  Other:  No abdominal tenderness  Medical Decision Making  Medically screening exam initiated at 7:05 PM.  Appropriate orders placed.  Asees Manfredi Walpole was informed that the remainder of the evaluation will be completed by another provider, this initial triage assessment does not replace that evaluation, and the importance of remaining in the ED until their evaluation is complete.  Labs and pregnancy test ordered.   Jacqlyn Larsen, PA-C 10/30/21 1922    Jacqlyn Larsen, [place] 10/30/21 [phone]

## 2021-10-31 ENCOUNTER — Emergency Department (HOSPITAL_COMMUNITY): Payer: Medicare Other

## 2021-10-31 DIAGNOSIS — N8003 Adenomyosis of the uterus: Secondary | ICD-10-CM | POA: Diagnosis not present

## 2021-10-31 NOTE — Discharge Instructions (Addendum)
Your evaluation today was reassuring. We recommend follow-up with your OB/GYN for further evaluation.  Return to the ED for any new or concerning symptoms.

## 2021-10-31 NOTE — ED Provider Notes (Signed)
[place] DEPT Provider Note   CSN: [id number] Arrival date & time: 10/30/21  1857     History  Chief Complaint  Patient presents with   Possible Pregnancy    Veronica Perez is a 46 y.o. female.  46 year old female presents to the emergency department for evaluation of suprapubic cramping.  She reports that she has had episodes of cramping intermittently, but they were more severe last night.  This has been associated with vaginal bleeding.  Vaginal bleeding began as spotting 4 days ago and has gradually increased.  She has been passing some clots.  States that her vaginal bleeding is consistent with that of prior menstrual cycles; however, she also believes to be pregnant with twins.  She alleges having an ultrasound completed at [facility] which showed "2 heartbeats".  She is not having any fever, chest pain, shortness of breath, vaginal discharge, urinary symptoms.  The history is provided by the patient. No language interpreter was used.  Possible Pregnancy       Home Medications Prior to Admission medications   Medication Sig Start Date End Date Taking? Authorizing Provider  LORazepam (ATIVAN) 1 MG tablet Take 1 mg by mouth 2 (two) times daily as needed for anxiety. 10/22/21  Yes [provider]  methimazole (TAPAZOLE) 5 MG tablet Take 5 mg by mouth daily as needed (thyroid). 08/28/21  Yes [provider]  Multiple Vitamins-Minerals (MULTIVITAMIN ADULTS) TABS Take 1 tablet by mouth every morning.   Yes [provider]  melatonin 3 MG TABS tablet Take 1 tablet (3 mg total) by mouth daily after supper. Patient not taking: Reported on 10/31/2021 12/15/20   Armando Reichert, MD  Multiple Vitamins-Iron (MULTIVITAMINS WITH IRON) TABS tablet Take 1 tablet by mouth daily. Patient not taking: Reported on 10/31/2021 12/16/20   Armando Reichert, MD  propranolol (INDERAL) 10 MG tablet Take 1 tablet (10 mg total) by mouth 3  (three) times daily. (BETA BLOCKER) HOLD FOR HR <60 or SBP 90 or less 12/15/20 01/14/21  Armando Reichert, MD  risperiDONE (RISPERDAL) 1 MG tablet Take 1 tablet (1 mg total) by mouth in the morning. 12/15/20 01/14/21  Armando Reichert, MD  risperiDONE (RISPERDAL) 2 MG tablet Take 1 tablet (2 mg total) by mouth at bedtime. 12/15/20 01/14/21  Armando Reichert, MD  risperiDONE microspheres (RISPERDAL CONSTA) 25 MG injection Inject 2 mLs (25 mg total) into the muscle every 14 (fourteen) days. Shake syringe vigorously just before injection. Administer IM into either the deltoid muscle or the upper outer quadrant of the gluteal area. Patient not taking: Reported on 10/31/2021 12/28/20   Armando Reichert, MD      Allergies    Penicillins, Iodine, Lactose intolerance (gi), and Penicillins    Review of Systems   Review of Systems Ten systems reviewed and are negative for acute change, except as noted in the HPI.    Physical Exam Updated Vital Signs BP (!) 143/80   Pulse 90   Temp 98.7 F (37.1 C)   Resp 20   Ht '5\' 3"'$  (1.6 m)   Wt 76.2 kg   LMP  (LMP Unknown)   SpO2 100%   BMI 29.76 kg/m   Physical Exam Vitals and nursing note reviewed.  Constitutional:      General: She is not in acute distress.    Appearance: She is well-developed. She is not diaphoretic.     Comments: Nontoxic appearing and in NAD  HENT:     Head: Normocephalic  and atraumatic.  Eyes:     General: No scleral icterus.    Conjunctiva/sclera: Conjunctivae normal.  Pulmonary:     Effort: Pulmonary effort is normal. No respiratory distress.     Comments: Respirations even and unlabored Abdominal:     Comments: Soft, distended, obese abdomen  Musculoskeletal:        General: Normal range of motion.     Cervical back: Normal range of motion.  Skin:    General: Skin is warm and dry.     Coloration: Skin is not pale.     Findings: No erythema or rash.  Neurological:     Mental Status: She is alert and oriented to person,  place, and time.  Psychiatric:        Behavior: Behavior is cooperative.     ED Results / Procedures / Treatments   Labs (all labs ordered are listed, but only abnormal results are displayed) Labs Reviewed  COMPREHENSIVE METABOLIC PANEL - Abnormal; Notable for the following components:      Result Value   Glucose, Bld 124 (*)    Calcium 8.7 (*)    All other components within normal limits  URINALYSIS, ROUTINE W REFLEX MICROSCOPIC - Abnormal; Notable for the following components:   Color, Urine AMBER (*)    APPearance CLOUDY (*)    Hgb urine dipstick LARGE (*)    Protein, ur 30 (*)    RBC / HPF >50 (*)    Bacteria, UA RARE (*)    All other components within normal limits  CBC WITH DIFFERENTIAL/PLATELET  I-STAT BETA HCG BLOOD, ED ([facility], [facility], [facility] ONLY)    EKG None  Radiology US PELVIC DOPPLER (TORSION R/O OR MASS ARTERIAL FLOW)  Result Date: 10/31/2021 CLINICAL DATA:  Dysmenorrhea EXAM: TRANSABDOMINAL AND TRANSVAGINAL ULTRASOUND OF PELVIS DOPPLER ULTRASOUND OF OVARIES TECHNIQUE: Both transabdominal and transvaginal ultrasound examinations of the pelvis were performed. Transabdominal technique was performed for global imaging of the pelvis including uterus, ovaries, adnexal regions, and pelvic cul-de-sac. It was necessary to proceed with endovaginal exam following the transabdominal exam to visualize the uterus, endometrium, and adnexa. Color and duplex Doppler ultrasound was utilized to evaluate blood flow to the ovaries. COMPARISON:  Pelvic ultrasound 01/11/2019. FINDINGS: Uterus Measurements: 11.3 x 6.0 x 7.1 cm = volume: 250 mL. Mildly heterogenous appearance of the uterus with questionable enlargement. Mildly increased echogenicity in the inner myometrial junctional zone with tiny echogenic foci. Findings are suspicious for adenomyosis. No definite fibroid. There are some areas of more focal heterogeneity though this is favored due to adenomyosis. Endometrium Thickness: 6 mm.  No focal  abnormality visualized. Right ovary Measurements: 1.2 x 0.8 x 1.1 cm = volume: 0.6 mL. No definite mass. The right ovary is not optimally visualized. Left ovary Measurements: 4.1 x 1.8 x 2.0 cm = volume: 7.6 mL. Fluid-filled structure seen adjacent to the left adnexa which appears decreased from 01/11/2019. Pulsed Doppler evaluation of both ovaries demonstrates normal low-resistance arterial and venous waveforms. Other findings No abnormal free fluid. IMPRESSION: Uterine adenomyosis. Mild left hydrosalpinx decreased from 01/11/2019. No evidence of torsion. Electronically Signed   By: Placido Sou M.D.   On: 10/31/2021 01:26   US PELVIC COMPLETE WITH TRANSVAGINAL  Result Date: 10/31/2021 CLINICAL DATA:  Dysmenorrhea EXAM: TRANSABDOMINAL AND TRANSVAGINAL ULTRASOUND OF PELVIS DOPPLER ULTRASOUND OF OVARIES TECHNIQUE: Both transabdominal and transvaginal ultrasound examinations of the pelvis were performed. Transabdominal technique was performed for global imaging of the pelvis including uterus, ovaries, adnexal regions, and pelvic  cul-de-sac. It was necessary to proceed with endovaginal exam following the transabdominal exam to visualize the uterus, endometrium, and adnexa. Color and duplex Doppler ultrasound was utilized to evaluate blood flow to the ovaries. COMPARISON:  Pelvic ultrasound 01/11/2019. FINDINGS: Uterus Measurements: 11.3 x 6.0 x 7.1 cm = volume: 250 mL. Mildly heterogenous appearance of the uterus with questionable enlargement. Mildly increased echogenicity in the inner myometrial junctional zone with tiny echogenic foci. Findings are suspicious for adenomyosis. No definite fibroid. There are some areas of more focal heterogeneity though this is favored due to adenomyosis. Endometrium Thickness: 6 mm.  No focal abnormality visualized. Right ovary Measurements: 1.2 x 0.8 x 1.1 cm = volume: 0.6 mL. No definite mass. The right ovary is not optimally visualized. Left ovary Measurements: 4.1 x 1.8 x  2.0 cm = volume: 7.6 mL. Fluid-filled structure seen adjacent to the left adnexa which appears decreased from 01/11/2019. Pulsed Doppler evaluation of both ovaries demonstrates normal low-resistance arterial and venous waveforms. Other findings No abnormal free fluid. IMPRESSION: Uterine adenomyosis. Mild left hydrosalpinx decreased from 01/11/2019. No evidence of torsion. Electronically Signed   By: Placido Sou M.D.   On: 10/31/2021 01:26    Procedures Procedures    Medications Ordered in ED Medications - No data to display  ED Course/ Medical Decision Making/ A&P                           Medical Decision Making Amount and/or Complexity of Data Reviewed Radiology: ordered.   This patient presents to the ED for concern of suprapubic cramping with vaginal bleeding, this involves an extensive number of treatment options, and is a complaint that carries with it a high risk of complications and morbidity.  The differential diagnosis includes uncomplicated menses vs uterine fibroids vs ectopic pregnancy vs SAB   Co morbidities that complicate the patient evaluation  Bipolar d/o   Additional history obtained:  Additional history obtained from daughter   Lab Tests:  I Ordered, and personally interpreted labs.  The pertinent results include:  Negative hCG, normal CBC and CMP. UA with hematuria, likely contamination given vaginal bleeding.   Imaging Studies ordered:  I ordered imaging studies including pelvic US  I independently visualized and interpreted imaging which showed uterine adenomyosis w/o evidence of pregnancy, ovarian cyst, or torsion I agree with the radiologist interpretation   Cardiac Monitoring:  The patient was maintained on a cardiac monitor.  I personally viewed and interpreted the cardiac monitored which showed an underlying rhythm of: NSR   Medicines ordered and prescription drug management:  I have reviewed the patients home medicines and have made  adjustments as needed   Test Considered:  CTAP   Reevaluation:  After the interventions noted above, I reevaluated the patient and found that they have :stayed the same   Social Determinants of Health:  Insured patient Good social support   Dispostion:  After consideration of the diagnostic results and the patients response to treatment, I feel that the patent would benefit from outpatient OBGYN follow up. Encouraged OTC analgesics for cramping. Provided reassurance WRT vaginal bleeding in light of stable H/H. Return precautions discussed and provided. Patient discharged in stable condition with no unaddressed concerns.          Final Clinical Impression(s) / ED Diagnoses Final diagnoses:  Adenomyosis of the uterus    Rx / DC Orders ED Discharge Orders     None  Antonietta Breach, PA-C 43/60/67 [id number]    Delora Fuel, MD 03/52/48 [phone]

## 2021-11-06 DIAGNOSIS — E7800 Pure hypercholesterolemia, unspecified: Secondary | ICD-10-CM | POA: Diagnosis not present

## 2021-11-06 DIAGNOSIS — R030 Elevated blood-pressure reading, without diagnosis of hypertension: Secondary | ICD-10-CM | POA: Diagnosis not present

## 2021-11-06 DIAGNOSIS — F32A Depression, unspecified: Secondary | ICD-10-CM | POA: Diagnosis not present

## 2021-11-11 ENCOUNTER — Telehealth: Payer: Self-pay

## 2021-11-11 NOTE — Telephone Encounter (Signed)
Call pt and left voice to see if she wanted to her keep appointment to confirm pregnancy or get a referral to the OBGYN.

## 2021-11-12 ENCOUNTER — Ambulatory Visit (INDEPENDENT_AMBULATORY_CARE_PROVIDER_SITE_OTHER): Payer: Medicare Other | Admitting: Nurse Practitioner

## 2021-11-12 ENCOUNTER — Encounter: Payer: Self-pay | Admitting: Nurse Practitioner

## 2021-11-12 VITALS — BP 110/60 | HR 88 | Temp 97.4°F | Ht 63.0 in | Wt 241.6 lb

## 2021-11-12 DIAGNOSIS — N920 Excessive and frequent menstruation with regular cycle: Secondary | ICD-10-CM

## 2021-11-12 DIAGNOSIS — F17200 Nicotine dependence, unspecified, uncomplicated: Secondary | ICD-10-CM | POA: Diagnosis not present

## 2021-11-12 DIAGNOSIS — E0590 Thyrotoxicosis, unspecified without thyrotoxic crisis or storm: Secondary | ICD-10-CM | POA: Diagnosis not present

## 2021-11-12 DIAGNOSIS — R739 Hyperglycemia, unspecified: Secondary | ICD-10-CM

## 2021-11-12 LAB — TSH: TSH: <0.01 u[IU]/mL — ABNORMAL LOW (ref 0.35–5.50)

## 2021-11-12 LAB — T4, FREE: Free T4: 1.06 ng/dL (ref 0.60–1.60)

## 2021-11-12 LAB — HEMOGLOBIN A1C: Hgb A1c MFr Bld: 5.4 % (ref 4.6–6.5)

## 2021-11-12 NOTE — Assessment & Plan Note (Signed)
Check hgbA1c

## 2021-11-12 NOTE — Assessment & Plan Note (Signed)
Smokes 2cig per day Advised to quit due to risk of cancer, lung disease, heart disease and poor circulation.

## 2021-11-12 NOTE — Patient Instructions (Addendum)
Go to lab You will be contacted to schedule appt with GYN  Calorie Counting for Weight Loss Calories are units of energy. Your body needs a certain number of calories from food to keep going throughout the day. When you eat or drink more calories than your body needs, your body stores the extra calories mostly as fat. When you eat or drink fewer calories than your body needs, your body burns fat to get the energy it needs. Calorie counting means keeping track of how many calories you eat and drink each day. Calorie counting can be helpful if you need to lose weight. If you eat fewer calories than your body needs, you should lose weight. Ask your health care provider what a healthy weight is for you. For calorie counting to work, you will need to eat the right number of calories each day to lose a healthy amount of weight per week. A dietitian can help you figure out how many calories you need in a day and will suggest ways to reach your calorie goal. A healthy amount of weight to lose each week is usually 1-2 lb (0.5-0.9 kg). This usually means that your daily calorie intake should be reduced by 500-750 calories. Eating 1,200-1,500 calories a day can help most women lose weight. Eating 1,500-1,800 calories a day can help most men lose weight. What do I need to know about calorie counting? Work with your health care provider or dietitian to determine how many calories you should get each day. To meet your daily calorie goal, you will need to: Find out how many calories are in each food that you would like to eat. Try to do this before you eat. Decide how much of the food you plan to eat. Keep a food log. Do this by writing down what you ate and how many calories it had. To successfully lose weight, it is important to balance calorie counting with a healthy lifestyle that includes regular activity. Where do I find calorie information?  The number of calories in a food can be found on a Nutrition  Facts label. If a food does not have a Nutrition Facts label, try to look up the calories online or ask your dietitian for help. Remember that calories are listed per serving. If you choose to have more than one serving of a food, you will have to multiply the calories per serving by the number of servings you plan to eat. For example, the label on a package of bread might say that a serving size is 1 slice and that there are 90 calories in a serving. If you eat 1 slice, you will have eaten 90 calories. If you eat 2 slices, you will have eaten 180 calories. How do I keep a food log? After each time that you eat, record the following in your food log as soon as possible: What you ate. Be sure to include toppings, sauces, and other extras on the food. How much you ate. This can be measured in cups, ounces, or number of items. How many calories were in each food and drink. The total number of calories in the food you ate. Keep your food log near you, such as in a pocket-sized notebook or on an app or website on your mobile phone. Some programs will calculate calories for you and show you how many calories you have left to meet your daily goal. What are some portion-control tips? Know how many calories are in a serving. This  will help you know how many servings you can have of a certain food. Use a measuring cup to measure serving sizes. You could also try weighing out portions on a kitchen scale. With time, you will be able to estimate serving sizes for some foods. Take time to put servings of different foods on your favorite plates or in your favorite bowls and cups so you know what a serving looks like. Try not to eat straight from a food's packaging, such as from a bag or box. Eating straight from the package makes it hard to see how much you are eating and can lead to overeating. Put the amount you would like to eat in a cup or on a plate to make sure you are eating the right portion. Use smaller  plates, glasses, and bowls for smaller portions and to prevent overeating. Try not to multitask. For example, avoid watching TV or using your computer while eating. If it is time to eat, sit down at a table and enjoy your food. This will help you recognize when you are full. It will also help you be more mindful of what and how much you are eating. What are tips for following this plan? Reading food labels Check the calorie count compared with the serving size. The serving size may be smaller than what you are used to eating. Check the source of the calories. Try to choose foods that are high in protein, fiber, and vitamins, and low in saturated fat, trans fat, and sodium. Shopping Read nutrition labels while you shop. This will help you make healthy decisions about which foods to buy. Pay attention to nutrition labels for low-fat or fat-free foods. These foods sometimes have the same number of calories or more calories than the full-fat versions. They also often have added sugar, starch, or salt to make up for flavor that was removed with the fat. Make a grocery list of lower-calorie foods and stick to it. Cooking Try to cook your favorite foods in a healthier way. For example, try baking instead of frying. Use low-fat dairy products. Meal planning Use more fruits and vegetables. One-half of your plate should be fruits and vegetables. Include lean proteins, such as chicken, [place], and fish. Lifestyle Each week, aim to do one of the following: 150 minutes of moderate exercise, such as walking. 75 minutes of vigorous exercise, such as running. General information Know how many calories are in the foods you eat most often. This will help you calculate calorie counts faster. Find a way of tracking calories that works for you. Get creative. Try different apps or programs if writing down calories does not work for you. What foods should I eat?  Eat nutritious foods. It is better to have a  nutritious, high-calorie food, such as an avocado, than a food with few nutrients, such as a bag of potato chips. Use your calories on foods and drinks that will fill you up and will not leave you hungry soon after eating. Examples of foods that fill you up are nuts and nut butters, vegetables, lean proteins, and high-fiber foods such as whole grains. High-fiber foods are foods with more than 5 g of fiber per serving. Pay attention to calories in drinks. Low-calorie drinks include water and unsweetened drinks. The items listed above may not be a complete list of foods and beverages you can eat. Contact a dietitian for more information. What foods should I limit? Limit foods or drinks that are not good sources  of vitamins, minerals, or protein or that are high in unhealthy fats. These include: Candy. Other sweets. Sodas, specialty coffee drinks, alcohol, and juice. The items listed above may not be a complete list of foods and beverages you should avoid. Contact a dietitian for more information. How do I count calories when eating out? Pay attention to portions. Often, portions are much larger when eating out. Try these tips to keep portions smaller: Consider sharing a meal instead of getting your own. If you get your own meal, eat only half of it. Before you start eating, ask for a container and put half of your meal into it. When available, consider ordering smaller portions from the menu instead of full portions. Pay attention to your food and drink choices. Knowing the way food is cooked and what is included with the meal can help you eat fewer calories. If calories are listed on the menu, choose the lower-calorie options. Choose dishes that include vegetables, fruits, whole grains, low-fat dairy products, and lean proteins.  Choose items that are boiled, broiled, grilled, or steamed. Avoid items that are buttered, battered, fried, or served with cream sauce. Items labeled as crispy are  usually fried, unless stated otherwise. Choose water, low-fat milk, unsweetened iced tea, or other drinks without added sugar. If you want an alcoholic beverage, choose a lower-calorie option, such as a glass of wine or light beer. Ask for dressings, sauces, and syrups on the side. These are usually high in calories, so you should limit the amount you eat. If you want a salad, choose a garden salad and ask for grilled meats. Avoid extra toppings such as bacon, cheese, or fried items. Ask for the dressing on the side, or ask for olive oil and vinegar or lemon to use as dressing. Estimate how many servings of a food you are given. Knowing serving sizes will help you be aware of how much food you are eating at restaurants. Where to find more information Centers for Disease Control and Prevention: http://www.wolf.info/ U.S. Department of Agriculture: http://www.wilson-mendoza.org/ Summary Calorie counting means keeping track of how many calories you eat and drink each day. If you eat fewer calories than your body needs, you should lose weight. A healthy amount of weight to lose per week is usually 1-2 lb (0.5-0.9 kg). This usually means reducing your daily calorie intake by 500-750 calories. The number of calories in a food can be found on a Nutrition Facts label. If a food does not have a Nutrition Facts label, try to look up the calories online or ask your dietitian for help. Use smaller plates, glasses, and bowls for smaller portions and to prevent overeating. Use your calories on foods and drinks that will fill you up and not leave you hungry shortly after a meal. This information is not intended to replace advice given to you by your health care provider. Make sure you discuss any questions you have with your health care provider. Document Revised: 03/03/2019 Document Reviewed: 03/03/2019 Elsevier Patient Education  [place].  Exercising to Stay Healthy To become healthy and stay healthy, it is recommended that you  do moderate-intensity and vigorous-intensity exercise. You can tell that you are exercising at a moderate intensity if your heart starts beating faster and you start breathing faster but can still hold a conversation. You can tell that you are exercising at a vigorous intensity if you are breathing much harder and faster and cannot hold a conversation while exercising. How can exercise benefit  me? Exercising regularly is important. It has many health benefits, such as: Improving overall fitness, flexibility, and endurance. Increasing bone density. Helping with weight control. Decreasing body fat. Increasing muscle strength and endurance. Reducing stress and tension, anxiety, depression, or anger. Improving overall health. What guidelines should I follow while exercising? Before you start a new exercise program, talk with your health care provider. Do not exercise so much that you hurt yourself, feel dizzy, or get very short of breath. Wear comfortable clothes and wear shoes with good support. Drink plenty of water while you exercise to prevent dehydration or heat stroke. Work out until your breathing and your heartbeat get faster (moderate intensity). How often should I exercise? Choose an activity that you enjoy, and set realistic goals. Your health care provider can help you make an activity plan that is individually designed and works best for you. Exercise regularly as told by your health care provider. This may include: Doing strength training two times a week, such as: Lifting weights. Using resistance bands. Push-ups. Sit-ups. Yoga. Doing a certain intensity of exercise for a given amount of time. Choose from these options: A total of 150 minutes of moderate-intensity exercise every week. A total of 75 minutes of vigorous-intensity exercise every week. A mix of moderate-intensity and vigorous-intensity exercise every week. Children, pregnant women, people who have not exercised  regularly, people who are overweight, and older adults may need to talk with a health care provider about what activities are safe to perform. If you have a medical condition, be sure to talk with your health care provider before you start a new exercise program. What are some exercise ideas? Moderate-intensity exercise ideas include: Walking 1 mile (1.6 km) in about 15 minutes. Biking. Hiking. Golfing. Dancing. Water aerobics. Vigorous-intensity exercise ideas include: Walking 4.5 miles (7.2 km) or more in about 1 hour. Jogging or running 5 miles (8 km) in about 1 hour. Biking 10 miles (16.1 km) or more in about 1 hour. Lap swimming. Roller-skating or in-line skating. Cross-country skiing. Vigorous competitive sports, such as football, basketball, and soccer. Jumping rope. Aerobic dancing. What are some everyday activities that can help me get exercise? Yard work, such as: Psychologist, educational. Raking and bagging leaves. Washing your car. Pushing a stroller. Shoveling snow. Gardening. Washing windows or floors. How can I be more active in my day-to-day activities? Use stairs instead of an elevator. Take a walk during your lunch break. If you drive, park your car farther away from your work or school. If you take public transportation, get off one stop early and walk the rest of the way. Stand up or walk around during all of your indoor phone calls. Get up, stretch, and walk around every 30 minutes throughout the day. Enjoy exercise with a friend. Support to continue exercising will help you keep a regular routine of activity. Where to find more information You can find more information about exercising to stay healthy from: U.S. Department of Health and Human Services: [employer].at Centers for Disease Control and Prevention (CDC): http://www.wolf.info/ Summary Exercising regularly is important. It will improve your overall fitness, flexibility, and endurance. Regular exercise will  also improve your overall health. It can help you control your weight, reduce stress, and improve your bone density. Do not exercise so much that you hurt yourself, feel dizzy, or get very short of breath. Before you start a new exercise program, talk with your health care provider. This information is not intended to replace advice given  to you by your health care provider. Make sure you discuss any questions you have with your health care provider. Document Revised: 05/18/2020 Document Reviewed: 05/18/2020 Elsevier Patient Education  [place].

## 2021-11-12 NOTE — Assessment & Plan Note (Signed)
Declined referral to nutritionist Provided printed information on necessary diet modification and need for daily exercise. Wt Readings from Last 3 Encounters:  11/12/21 241 lb 9.6 oz (109.6 kg)  10/30/21 167 lb 15.9 oz (76.2 kg)  11/25/20 170 lb 4.8 oz (77.2 kg)   Check TSH, T4 and hgbA1c

## 2021-11-12 NOTE — Assessment & Plan Note (Signed)
Declined to take methimazole and propanolol. 70lbs weight gain in last 1year.  Check TSH and T4 today

## 2021-11-12 NOTE — Progress Notes (Signed)
Established Patient Visit  Patient: Veronica Perez   DOB: October 28, 1975   46 y.o. Female  MRN: [id number] Visit Date: 11/12/2021  Subjective:    Chief Complaint  Patient presents with   Fibroids    Pt states she is not pregnant had ultra sound a [facility] showed she had 2 fibroids on uterus.   HPI Hyperthyroidism Declined to take methimazole and propanolol. 70lbs weight gain in last 1year.  Check TSH and T4 today  Hyperglycemia Check hgbA1c  TOBACCO DEPENDENCE Smokes 2cig per day Advised to quit due to risk of cancer, lung disease, heart disease and poor circulation.  Morbid obesity ([place]) Declined referral to nutritionist Provided printed information on necessary diet modification and need for daily exercise. Wt Readings from Last 3 Encounters:  11/12/21 241 lb 9.6 oz (109.6 kg)  10/30/21 167 lb 15.9 oz (76.2 kg)  11/25/20 170 lb 4.8 oz (77.2 kg)   Check TSH, T4 and hgbA1c  Menorrhagia with regular cycle Vaginal bleeding with large clots every 28-30days, last 8-9days, occasionally bleeds through underpants. Associated with back pain. Denies any dyspareunia. Pelvic US 10/2021: 11.3 x 6.0 x 7.1 cm = volume: 250 mL. Mildly heterogenous appearance of the uterus with questionable enlargement. Mildly increased echogenicity in the inner myometrial junctional zone with tiny echogenic foci. Findings are suspicious for adenomyosis. Normal CBC  Entered GYN referral  Reviewed medical, surgical, and social history today  Medications: Outpatient Medications Prior to Visit  Medication Sig   LORazepam (ATIVAN) 1 MG tablet Take 1 mg by mouth 2 (two) times daily as needed for anxiety.   Multiple Vitamins-Minerals (MULTIVITAMIN ADULTS) TABS Take 1 tablet by mouth every morning.   [DISCONTINUED] Multiple Vitamins-Iron (MULTIVITAMINS WITH IRON) TABS tablet Take 1 tablet by mouth daily.   [DISCONTINUED] melatonin 3 MG TABS tablet Take 1 tablet (3 mg total) by mouth  daily after supper. (Patient not taking: Reported on 10/31/2021)   [DISCONTINUED] methimazole (TAPAZOLE) 5 MG tablet Take 5 mg by mouth daily as needed (thyroid). (Patient not taking: Reported on 11/12/2021)   [DISCONTINUED] propranolol (INDERAL) 10 MG tablet Take 1 tablet (10 mg total) by mouth 3 (three) times daily. (BETA BLOCKER) HOLD FOR HR <60 or SBP 90 or less   [DISCONTINUED] risperiDONE (RISPERDAL) 1 MG tablet Take 1 tablet (1 mg total) by mouth in the morning.   [DISCONTINUED] risperiDONE (RISPERDAL) 2 MG tablet Take 1 tablet (2 mg total) by mouth at bedtime.   [DISCONTINUED] risperiDONE microspheres (RISPERDAL CONSTA) 25 MG injection Inject 2 mLs (25 mg total) into the muscle every 14 (fourteen) days. Shake syringe vigorously just before injection. Administer IM into either the deltoid muscle or the upper outer quadrant of the gluteal area. (Patient not taking: Reported on 10/31/2021)   No facility-administered medications prior to visit.   Reviewed past medical and social history.   ROS per HPI above  Last CBC Lab Results  Component Value Date   WBC 9.7 10/30/2021   HGB 12.5 10/30/2021   HCT 37.8 10/30/2021   MCV 85.3 10/30/2021   MCH 28.2 10/30/2021   RDW 12.1 10/30/2021   PLT 303 57/02/7791   Last metabolic panel Lab Results  Component Value Date   GLUCOSE 124 (H) 10/30/2021   NA 140 10/30/2021   K 3.6 10/30/2021   CL 110 10/30/2021   CO2 23 10/30/2021   BUN 17 10/30/2021   CREATININE 0.65 10/30/2021  GFRNONAA >60 10/30/2021   CALCIUM 8.7 (L) 10/30/2021   PROT 7.3 10/30/2021   ALBUMIN 3.7 10/30/2021   BILITOT 0.4 10/30/2021   ALKPHOS 68 10/30/2021   AST 18 10/30/2021   ALT 16 10/30/2021   ANIONGAP 7 10/30/2021      Objective:  BP 110/60 (BP Location: Left Arm, Patient Position: Sitting, Cuff Size: Large)   Pulse 88   Temp (!) 97.4 F (36.3 C) (Temporal)   Ht '5\' 3"'$  (1.6 m)   Wt 241 lb 9.6 oz (109.6 kg)   LMP 10/28/2021 (Exact Date)   SpO2 96%   BMI  42.80 kg/m      Physical Exam Cardiovascular:     Rate and Rhythm: Normal rate.     Pulses: Normal pulses.  Pulmonary:     Effort: Pulmonary effort is normal.  Abdominal:     General: Bowel sounds are normal.     Palpations: Abdomen is soft.  Neurological:     Mental Status: She is alert and oriented to person, place, and time.     No results found for any visits on 11/12/21.    Assessment & Plan:    Problem List Items Addressed This Visit       Endocrine   Hyperthyroidism - Primary    Declined to take methimazole and propanolol. 70lbs weight gain in last 1year.  Check TSH and T4 today      Relevant Orders   TSH   T4, free     Other   Hyperglycemia    Check hgbA1c      Relevant Orders   Hemoglobin A1c   Menorrhagia with regular cycle    Vaginal bleeding with large clots every 28-30days, last 8-9days, occasionally bleeds through underpants. Associated with back pain. Denies any dyspareunia. Pelvic US 10/2021: 11.3 x 6.0 x 7.1 cm = volume: 250 mL. Mildly heterogenous appearance of the uterus with questionable enlargement. Mildly increased echogenicity in the inner myometrial junctional zone with tiny echogenic foci. Findings are suspicious for adenomyosis. Normal CBC  Entered GYN referral      Relevant Orders   Ambulatory referral to Gynecology   Morbid obesity ([place])    Declined referral to nutritionist Provided printed information on necessary diet modification and need for daily exercise. Wt Readings from Last 3 Encounters:  11/12/21 241 lb 9.6 oz (109.6 kg)  10/30/21 167 lb 15.9 oz (76.2 kg)  11/25/20 170 lb 4.8 oz (77.2 kg)   Check TSH, T4 and hgbA1c      TOBACCO DEPENDENCE    Smokes 2cig per day Advised to quit due to risk of cancer, lung disease, heart disease and poor circulation.      Return if symptoms worsen or fail to improve.     Wilfred Lacy, NP

## 2021-11-12 NOTE — Assessment & Plan Note (Addendum)
Vaginal bleeding with large clots every 28-30days, last 8-9days, occasionally bleeds through underpants. Associated with back pain. Denies any dyspareunia. Pelvic US 10/2021: 11.3 x 6.0 x 7.1 cm = volume: 250 mL. Mildly heterogenous appearance of the uterus with questionable enlargement. Mildly increased echogenicity in the inner myometrial junctional zone with tiny echogenic foci. Findings are suspicious for adenomyosis. Normal CBC  Entered GYN referral

## 2021-11-21 ENCOUNTER — Encounter: Payer: 59 | Admitting: Nurse Practitioner

## 2021-11-29 ENCOUNTER — Ambulatory Visit: Payer: Medicare Other | Admitting: Obstetrics & Gynecology

## 2022-01-06 NOTE — Telephone Encounter (Signed)
Signing encounter to close this visit.

## 2022-02-06 DIAGNOSIS — F32A Depression, unspecified: Secondary | ICD-10-CM | POA: Diagnosis not present

## 2022-02-06 DIAGNOSIS — R5383 Other fatigue: Secondary | ICD-10-CM | POA: Diagnosis not present

## 2022-02-06 DIAGNOSIS — E7800 Pure hypercholesterolemia, unspecified: Secondary | ICD-10-CM | POA: Diagnosis not present

## 2022-02-06 DIAGNOSIS — Z79899 Other long term (current) drug therapy: Secondary | ICD-10-CM | POA: Diagnosis not present

## 2022-02-06 DIAGNOSIS — R030 Elevated blood-pressure reading, without diagnosis of hypertension: Secondary | ICD-10-CM | POA: Diagnosis not present

## 2022-06-23 ENCOUNTER — Emergency Department (EMERGENCY_DEPARTMENT_HOSPITAL)
Admission: EM | Admit: 2022-06-23 | Discharge: 2022-06-24 | Disposition: A | Discharge status: Other Acute Inpatient Hospital | Payer: 59 | Source: Home / Self Care | Attending: Emergency Medicine | Admitting: Emergency Medicine

## 2022-06-23 ENCOUNTER — Encounter (HOSPITAL_COMMUNITY): Payer: Self-pay

## 2022-06-23 ENCOUNTER — Other Ambulatory Visit: Payer: Self-pay

## 2022-06-23 DIAGNOSIS — F312 Bipolar disorder, current episode manic severe with psychotic features: Secondary | ICD-10-CM | POA: Insufficient documentation

## 2022-06-23 DIAGNOSIS — F309 Manic episode, unspecified: Secondary | ICD-10-CM

## 2022-06-23 LAB — COMPREHENSIVE METABOLIC PANEL
ALT: 24 U/L (ref 0–44)
AST: 19 U/L (ref 15–41)
Albumin: 3.6 g/dL (ref 3.5–5.0)
Alkaline Phosphatase: 64 U/L (ref 38–126)
Anion gap: 11 (ref 5–15)
BUN: 11 mg/dL (ref 6–20)
CO2: 21 mmol/L — ABNORMAL LOW (ref 22–32)
Calcium: 9.1 mg/dL (ref 8.9–10.3)
Chloride: 106 mmol/L (ref 98–111)
Creatinine, Ser: 0.66 mg/dL (ref 0.44–1.00)
GFR, Estimated: >60 mL/min (ref >60)
Glucose, Bld: 111 mg/dL — ABNORMAL HIGH (ref 70–99)
Potassium: 3.7 mmol/L (ref 3.5–5.1)
Sodium: 138 mmol/L (ref 135–145)
Total Bilirubin: 0.9 mg/dL (ref 0.3–1.2)
Total Protein: 7.1 g/dL (ref 6.5–8.1)

## 2022-06-23 LAB — CBC
HCT: 37.4 % (ref 36.0–46.0)
Hemoglobin: 11.9 g/dL — ABNORMAL LOW (ref 12.0–15.0)
MCH: 24.9 pg — ABNORMAL LOW (ref 26.0–34.0)
MCHC: 31.8 g/dL (ref 30.0–36.0)
MCV: 78.4 fL — ABNORMAL LOW (ref 80.0–100.0)
Platelets: 296 10*3/uL (ref 150–400)
RBC: 4.77 MIL/uL (ref 3.87–5.11)
RDW: 13.2 % (ref 11.5–15.5)
WBC: 8.2 10*3/uL (ref 4.0–10.5)
nRBC: 0 % (ref 0.0–0.2)

## 2022-06-23 LAB — ETHANOL: Alcohol, Ethyl (B): <10 mg/dL (ref <10)

## 2022-06-23 LAB — I-STAT BETA HCG BLOOD, ED (MC, WL, AP ONLY): I-stat hCG, quantitative: <5 m[IU]/mL (ref <5)

## 2022-06-23 MED ORDER — LORAZEPAM 1 MG PO TABS
1.0000 mg | ORAL_TABLET | Freq: Once | ORAL | Status: AC
  Administered 2022-06-24: 1 mg via ORAL
  Filled 2022-06-23: qty 1

## 2022-06-23 NOTE — ED Notes (Signed)
IVC paperwork finished in green zone in purple clipboard, IVCd: 06/23/22, EXP: 06/30/22

## 2022-06-23 NOTE — ED Notes (Addendum)
Pt changed out into wine scrubs, wanded by security. All pt belongings placed into 3 belonging bags

## 2022-06-23 NOTE — ED Provider Triage Note (Addendum)
Emergency Medicine Provider Triage Evaluation Note  Veronica Perez , a 47 y.o. female  was evaluated in triage.  Patient is manic.  Unable to state why she is here.  She does answer all of my questions and denies SI/HI, AVH, drug and alcohol use  When asked if she wants to talk to her psychiatric team she says "what for."  Reports history of ADHD but no history of bipolar disorder or schizophrenia.  Bipolar disorder is listed in her chart Review of Systems  Positive:  Negative:   Physical Exam  There were no vitals taken for this visit. Gen:   Awake, no distress   Resp:  Normal effort  MSK:   Moves extremities without difficulty  Other:  Pressured speech that is very tangential however patient is redirectable  Medical Decision Making  Medically screening exam initiated at 6:24 PM.  Appropriate orders placed.  Dymple Gatica Pumphrey was informed that the remainder of the evaluation will be completed by another provider, this initial triage assessment does not replace that evaluation, and the importance of remaining in the ED until their evaluation is complete.     Woodroe Chen 06/23/22 1825  Patient has been here for 2 hours and 20 minutes.  Has been constantly speaking, crying and having outbursts since arrival.    Saddie Benders, PA-C 06/23/22 2012

## 2022-06-23 NOTE — ED Triage Notes (Addendum)
Pt manic, unable to state why she is here; denies SI/HI, AVH; answering orientation questions appropriately; pt tearful in triage, endorsing housing issues

## 2022-06-23 NOTE — ED Notes (Signed)
IVC paperwork finished in green zone in purple clipboard, IVCd: 06/23/22, EXP: 06/30/22 

## 2022-06-23 NOTE — ED Notes (Signed)
IVC paperwork finished in orange zone in purple clipboard, IVCd: 06/23/22, EXP: 06/30/22

## 2022-06-23 NOTE — ED Provider Notes (Signed)
Center EMERGENCY DEPARTMENT AT [facility] Provider Note   CSN: [id number] Arrival date & time: 06/23/22  1751     History No chief complaint on file.   Veronica Perez is a 47 y.o. female with a documented history of PTSD, bipolar disorder, mania and other psychiatric diagnoses presenting today with mania.  Patient is unable to tell me how she got here or why she is here.  She does deny SI/HI, AVH and drug use.  She is pressured and tangential speech.  Clearly responding to internal stimuli.  Is not accompanied by anybody.  Told nursing staff that she was brought by [employer] however there were no officers present for any check-in or evaluation.  HPI     Home Medications Prior to Admission medications   Medication Sig Start Date End Date Taking? Authorizing Provider  LORazepam (ATIVAN) 1 MG tablet Take 1 mg by mouth 2 (two) times daily as needed for anxiety. 10/22/21   [provider]  Multiple Vitamins-Minerals (MULTIVITAMIN ADULTS) TABS Take 1 tablet by mouth every morning.    [provider]      Allergies    Penicillins, Iodine, Lactose intolerance (gi), and Penicillins    Review of Systems   Review of Systems  Physical Exam Updated Vital Signs BP (!) 177/114 (BP Location: Right Arm)   Pulse (!) 112   Temp 98.9 F (37.2 C) (Oral)   Resp 16   SpO2 99%  Physical Exam Vitals and nursing note reviewed.  Constitutional:      Appearance: Normal appearance.  HENT:     Head: Normocephalic and atraumatic.  Eyes:     General: No scleral icterus.    Conjunctiva/sclera: Conjunctivae normal.  Pulmonary:     Effort: Pulmonary effort is normal. No respiratory distress.  Skin:    Findings: No rash.  Neurological:     Mental Status: She is alert.  Psychiatric:        Mood and Affect: Mood normal.     ED Results / Procedures / Treatments   Labs (all labs ordered are listed, but only abnormal results are displayed) Labs Reviewed  CBC -  Abnormal; Notable for the following components:      Result Value   Hemoglobin 11.9 (*)    MCV 78.4 (*)    MCH 24.9 (*)    All other components within normal limits  COMPREHENSIVE METABOLIC PANEL  ETHANOL  RAPID URINE DRUG SCREEN, HOSP PERFORMED  I-STAT BETA HCG BLOOD, ED ([facility], [facility], [facility] ONLY)    EKG None  Radiology No results found.  Procedures Procedures   Medications Ordered in ED Medications - No data to display  ED Course/ Medical Decision Making/ A&P Clinical Course as of 06/23/22 2029  Mon Jun 23, 2022  2024 I spoke with the patient's mother, father and daughter.  They all report that the patient has been taking very poor care of herself.  They state that they have tried to have their psychiatric team intervene but nobody is helping them.  They also report that they have called the police saying that she is a threat to herself and others because she is not sleeping and is disoriented however they continue to be told that there is nothing they can do.  They report that they saw the patient earlier today and she was not mentating appropriately but at her baseline mania as of late.  They are unsure how she got to the emergency department. [MR]  Clinical Course User Index [MR] Donterrius Santucci, Gabriel Cirri, PA-C                             Medical Decision Making Amount and/or Complexity of Data Reviewed Labs: ordered.   47 year old presenting today manic.  She is unable to tell me how she arrived here.  She did not come with anybody.  She has been speaking constantly with tangential speech, very emotional and responding to internal stimuli since she arrived 2 hours and 25 minutes ago.  8:15pm: I notified family that the patient is in the emergency department.  They are hoping to speak with the behavioral health team.  At this time patient is medically cleared.  Behavioral health team to appropriately disposition the patient.  Likely will require admission.   Final Clinical  Impression(s) / ED Diagnoses Final diagnoses:  None    Rx / DC Orders ED Discharge Orders     None        Ariannah Arenson, Gabriel Cirri, PA-C 06/23/22 2031    Terald Sleeper, MD 06/23/22 2212

## 2022-06-24 ENCOUNTER — Encounter (HOSPITAL_COMMUNITY): Payer: Self-pay | Admitting: Nurse Practitioner

## 2022-06-24 ENCOUNTER — Inpatient Hospital Stay (HOSPITAL_COMMUNITY)
Admission: AD | Admit: 2022-06-24 | Discharge: 2022-07-10 | DRG: 885 | Disposition: A | Discharge status: Home / Self Care | Payer: 59 | Source: Intra-hospital | Attending: Psychiatry | Admitting: Psychiatry

## 2022-06-24 ENCOUNTER — Other Ambulatory Visit: Payer: Self-pay

## 2022-06-24 DIAGNOSIS — Z79899 Other long term (current) drug therapy: Secondary | ICD-10-CM

## 2022-06-24 DIAGNOSIS — I10 Essential (primary) hypertension: Secondary | ICD-10-CM | POA: Diagnosis not present

## 2022-06-24 DIAGNOSIS — F411 Generalized anxiety disorder: Secondary | ICD-10-CM | POA: Diagnosis present

## 2022-06-24 DIAGNOSIS — G4700 Insomnia, unspecified: Secondary | ICD-10-CM | POA: Diagnosis present

## 2022-06-24 DIAGNOSIS — E0590 Thyrotoxicosis, unspecified without thyrotoxic crisis or storm: Secondary | ICD-10-CM | POA: Diagnosis not present

## 2022-06-24 DIAGNOSIS — F3112 Bipolar disorder, current episode manic without psychotic features, moderate: Principal | ICD-10-CM | POA: Diagnosis present

## 2022-06-24 DIAGNOSIS — F17210 Nicotine dependence, cigarettes, uncomplicated: Secondary | ICD-10-CM | POA: Diagnosis present

## 2022-06-24 DIAGNOSIS — F312 Bipolar disorder, current episode manic severe with psychotic features: Principal | ICD-10-CM | POA: Diagnosis present

## 2022-06-24 DIAGNOSIS — Z818 Family history of other mental and behavioral disorders: Secondary | ICD-10-CM

## 2022-06-24 DIAGNOSIS — K5900 Constipation, unspecified: Secondary | ICD-10-CM | POA: Diagnosis present

## 2022-06-24 DIAGNOSIS — F3164 Bipolar disorder, current episode mixed, severe, with psychotic features: Secondary | ICD-10-CM | POA: Diagnosis not present

## 2022-06-24 DIAGNOSIS — Z880 Allergy status to penicillin: Secondary | ICD-10-CM | POA: Diagnosis not present

## 2022-06-24 MED ORDER — LORAZEPAM 1 MG PO TABS: 1.0000 mg | ORAL_TABLET | Freq: Four times a day (QID) | ORAL | Status: DC | PRN

## 2022-06-24 MED ORDER — DIPHENHYDRAMINE HCL 25 MG PO CAPS: 50.0000 mg | ORAL_CAPSULE | Freq: Three times a day (TID) | ORAL | Status: DC | PRN

## 2022-06-24 MED ORDER — CARIPRAZINE HCL 3 MG PO CAPS
3.0000 mg | ORAL_CAPSULE | Freq: Every day | ORAL | Status: DC
  Filled 2022-06-24 (×3): qty 1

## 2022-06-24 MED ORDER — LORAZEPAM 1 MG PO TABS
1.0000 mg | ORAL_TABLET | Freq: Two times a day (BID) | ORAL | Status: DC
  Administered 2022-06-24 – 2022-06-25 (×2): 1 mg via ORAL
  Filled 2022-06-24 (×2): qty 1

## 2022-06-24 MED ORDER — HALOPERIDOL LACTATE 5 MG/ML IJ SOLN: 5.0000 mg | Freq: Three times a day (TID) | INTRAMUSCULAR | Status: DC | PRN

## 2022-06-24 MED ORDER — ALUM & MAG HYDROXIDE-SIMETH 200-200-20 MG/5ML PO SUSP: 30.0000 mL | ORAL | Status: DC | PRN

## 2022-06-24 MED ORDER — CARIPRAZINE HCL 1.5 MG PO CAPS
3.0000 mg | ORAL_CAPSULE | Freq: Every day | ORAL | Status: DC
  Filled 2022-06-24: qty 1
  Filled 2022-06-24: qty 2

## 2022-06-24 MED ORDER — ACETAMINOPHEN 325 MG PO TABS
650.0000 mg | ORAL_TABLET | Freq: Four times a day (QID) | ORAL | Status: DC | PRN
  Administered 2022-06-28: 650 mg via ORAL
  Filled 2022-06-24: qty 2

## 2022-06-24 MED ORDER — LORAZEPAM 1 MG PO TABS: 1.0000 mg | ORAL_TABLET | Freq: Two times a day (BID) | ORAL | Status: DC

## 2022-06-24 MED ORDER — LORAZEPAM 1 MG PO TABS: 2.0000 mg | ORAL_TABLET | Freq: Once | ORAL | Status: DC

## 2022-06-24 MED ORDER — LORAZEPAM 2 MG/ML IJ SOLN: 1.0000 mg | Freq: Three times a day (TID) | INTRAMUSCULAR | Status: DC | PRN

## 2022-06-24 MED ORDER — MAGNESIUM HYDROXIDE 400 MG/5ML PO SUSP
30.0000 mL | Freq: Every day | ORAL | Status: DC | PRN
  Administered 2022-07-04 – 2022-07-05 (×2): 30 mL via ORAL
  Filled 2022-06-24 (×2): qty 30

## 2022-06-24 MED ORDER — LORAZEPAM 1 MG PO TABS: 1.0000 mg | ORAL_TABLET | Freq: Three times a day (TID) | ORAL | Status: DC | PRN

## 2022-06-24 MED ORDER — HALOPERIDOL 5 MG PO TABS: 5.0000 mg | ORAL_TABLET | Freq: Three times a day (TID) | ORAL | Status: DC | PRN

## 2022-06-24 MED ORDER — DIPHENHYDRAMINE HCL 50 MG/ML IJ SOLN: 50.0000 mg | Freq: Three times a day (TID) | INTRAMUSCULAR | Status: DC | PRN

## 2022-06-24 NOTE — Tx Team (Signed)
Initial Treatment Plan 06/24/2022 5:25 PM Veronica Perez WJX:[id number]    PATIENT STRESSORS: Financial difficulties   Health problems   Legal issue     PATIENT STRENGTHS: Communication skills  Supportive family/friends    PATIENT IDENTIFIED PROBLEMS: Paranoia  Anxiety  Depression  Ineffective coping skills               DISCHARGE CRITERIA:  Ability to meet basic life and health needs Adequate post-discharge living arrangements  PRELIMINARY DISCHARGE PLAN: Attend aftercare/continuing care group Outpatient therapy Return to previous living arrangement  PATIENT/FAMILY INVOLVEMENT: This treatment plan has been presented to and reviewed with the patient, Veronica Perez, and/or family member.  The patient and family have been given the opportunity to ask questions and make suggestions.  Clarene Critchley, RN 06/24/2022, 5:25 PM

## 2022-06-24 NOTE — Progress Notes (Addendum)
Pt was accepted to [facility] TODAY 06/24/2022. Bed assignment: 502-1  Pt meets inpatient criteria per Eligha Bridegroom, NP  Attending Physician will be Phineas Inches, MD  Report can be called to: - Adult unit: [phone]  Pt can arrive after 1500  Care Team Notified: [facility] Rona Ravens, RN and Eligha Bridegroom, NP  Cathie Beams, [place]  06/24/2022 11:21 AM
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
## 2022-06-24 NOTE — ED Notes (Signed)
Pt currently showering.
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
## 2022-06-24 NOTE — ED Notes (Signed)
[employer] called to take pt to [facility] no ETA [facility]

## 2022-06-24 NOTE — ED Notes (Signed)
Report called to BH at this time.

## 2022-06-24 NOTE — ED Notes (Signed)
Pt released to [employer] with belonging and IVC. Pt ambulatory, [facility] notified
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
## 2022-06-25 ENCOUNTER — Encounter (HOSPITAL_COMMUNITY): Payer: Self-pay

## 2022-06-25 DIAGNOSIS — F312 Bipolar disorder, current episode manic severe with psychotic features: Secondary | ICD-10-CM

## 2022-06-25 DIAGNOSIS — F411 Generalized anxiety disorder: Secondary | ICD-10-CM | POA: Diagnosis present

## 2022-06-25 DIAGNOSIS — G4700 Insomnia, unspecified: Secondary | ICD-10-CM | POA: Diagnosis present

## 2022-06-25 LAB — RAPID URINE DRUG SCREEN, HOSP PERFORMED
Amphetamines: NOT DETECTED
Barbiturates: NOT DETECTED
Benzodiazepines: POSITIVE — AB
Cocaine: NOT DETECTED
Opiates: NOT DETECTED
Tetrahydrocannabinol: POSITIVE — AB

## 2022-06-25 MED ORDER — TRAZODONE HCL 50 MG PO TABS: 50.0000 mg | ORAL_TABLET | Freq: Every evening | ORAL | Status: DC | PRN

## 2022-06-25 MED ORDER — RISPERIDONE 1 MG/ML PO SOLN
1.0000 mg | Freq: Two times a day (BID) | ORAL | Status: DC
  Administered 2022-06-25 – 2022-06-26 (×2): 1 mg via ORAL
  Filled 2022-06-25 (×6): qty 1

## 2022-06-25 MED ORDER — HALOPERIDOL LACTATE 5 MG/ML IJ SOLN
5.0000 mg | Freq: Two times a day (BID) | INTRAMUSCULAR | Status: DC
  Filled 2022-06-25 (×6): qty 1

## 2022-06-25 MED ORDER — RISPERIDONE 1 MG/ML PO SOLN
1.0000 mg | Freq: Two times a day (BID) | ORAL | Status: DC
  Filled 2022-06-25 (×3): qty 1

## 2022-06-25 MED ORDER — PROPRANOLOL HCL 10 MG PO TABS
10.0000 mg | ORAL_TABLET | Freq: Two times a day (BID) | ORAL | Status: DC
  Administered 2022-06-26 – 2022-06-30 (×8): 10 mg via ORAL
  Filled 2022-06-25 (×19): qty 1

## 2022-06-25 MED ORDER — LORAZEPAM 1 MG PO TABS
1.0000 mg | ORAL_TABLET | Freq: Two times a day (BID) | ORAL | Status: DC
  Administered 2022-06-27 – 2022-07-03 (×14): 1 mg via ORAL
  Filled 2022-06-25 (×14): qty 1

## 2022-06-25 MED ORDER — HYDROXYZINE HCL 25 MG PO TABS: 25.0000 mg | ORAL_TABLET | Freq: Three times a day (TID) | ORAL | Status: DC | PRN

## 2022-06-25 NOTE — [facility] Group Notes (Signed)
Adult Psychoeducational Group Note  Date:  06/25/2022 Time:  11:40 PM  Group Topic/Focus:  Wrap-Up Group:   The focus of this group is to help patients review their daily goal of treatment and discuss progress on daily workbooks.  Participation Level:  Did Not Attend  Participation Quality:  Did not attend  Affect:  Did not attend  Cognitive:  Did not attend  Insight: None  Engagement in Group:  Did not attend  Modes of Intervention:  Did not attend  Additional Comments:  Did not attend  Chasey Dull Lee 06/25/2022, 11:40 PM 
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
## 2022-06-25 NOTE — Progress Notes (Signed)
D- Patient alert and oriented.  Denies SI, HI, AVH, and pain. Patient initially refused medication but agreed to take only risperdal after forced med order education provided to patient. Patient in her room and yelling during the early evening.   A- Scheduled risperdal administered to patient, per MAR. Patient refused propanolol and ativan. Support and encouragement provided.  Routine safety checks conducted every 15 minutes.  Patient informed to notify staff with problems or concerns.  R- No adverse drug reactions noted. Patient contracts for safety at this time. Patient remains safe at this time.

## 2022-06-25 NOTE — BH IP Treatment Plan (Signed)
Interdisciplinary Treatment and Diagnostic Plan Update  06/25/2022 Time of Session: 11:38 AM  Veronica Perez MRN: [id number]  Principal Diagnosis: Bipolar 1 disorder with moderate mania (HCC)  Secondary Diagnoses: Principal Problem:   Bipolar 1 disorder with moderate mania (HCC)   Current Medications:  Current Facility-Administered Medications  Medication Dose Route Frequency Provider Last Rate Last Admin   acetaminophen (TYLENOL) tablet 650 mg  650 mg Oral Q6H PRN Eligha Bridegroom, NP       alum & mag hydroxide-simeth (MAALOX/MYLANTA) 200-200-20 MG/5ML suspension 30 mL  30 mL Oral Q4H PRN Eligha Bridegroom, NP       diphenhydrAMINE (BENADRYL) capsule 50 mg  50 mg Oral TID PRN Eligha Bridegroom, NP       Or   diphenhydrAMINE (BENADRYL) injection 50 mg  50 mg Intramuscular TID PRN Eligha Bridegroom, NP       haloperidol (HALDOL) tablet 5 mg  5 mg Oral TID PRN Eligha Bridegroom, NP       Or   haloperidol lactate (HALDOL) injection 5 mg  5 mg Intramuscular TID PRN Eligha Bridegroom, NP       hydrOXYzine (ATARAX) tablet 25 mg  25 mg Oral TID PRN Massengill, Harrold Donath, MD       LORazepam (ATIVAN) tablet 1 mg  1 mg Oral TID PRN Eligha Bridegroom, NP       Or   LORazepam (ATIVAN) injection 1 mg  1 mg Intramuscular TID PRN Eligha Bridegroom, NP       LORazepam (ATIVAN) tablet 1 mg  1 mg Oral BID Massengill, Nathan, MD       magnesium hydroxide (MILK OF MAGNESIA) suspension 30 mL  30 mL Oral Daily PRN Eligha Bridegroom, NP       propranolol (INDERAL) tablet 10 mg  10 mg Oral Q12H Massengill, Nathan, MD       risperiDONE (RISPERDAL) 1 MG/ML oral solution 1 mg  1 mg Oral Q12H Massengill, Nathan, MD       traZODone (DESYREL) tablet 50 mg  50 mg Oral QHS PRN Massengill, Harrold Donath, MD       PTA Medications: Medications Prior to Admission  Medication Sig Dispense Refill Last Dose   Multiple Vitamins-Minerals (MULTIVITAMIN ADULTS) TABS Take 1 tablet by mouth every morning.   Past Week   VRAYLAR 4.5 MG CAPS  Take 1 capsule by mouth at bedtime.   Past Week   LORazepam (ATIVAN) 2 MG tablet Take 2 mg by mouth 2 (two) times daily.       Patient Stressors: Financial difficulties   Health problems   Legal issue    Patient Strengths: Manufacturing systems engineer  Supportive family/friends   Treatment Modalities: Medication Management, Group therapy, Case management,  1 to 1 session with clinician, Psychoeducation, Recreational therapy.   Physician Treatment Plan for Primary Diagnosis: Bipolar 1 disorder with moderate mania (HCC) Long Term Goal(s):     Short Term Goals:    Medication Management: Evaluate patient's response, side effects, and tolerance of medication regimen.  Therapeutic Interventions: 1 to 1 sessions, Unit Group sessions and Medication administration.  Evaluation of Outcomes: Not Progressing  Physician Treatment Plan for Secondary Diagnosis: Principal Problem:   Bipolar 1 disorder with moderate mania (HCC)  Long Term Goal(s):     Short Term Goals:       Medication Management: Evaluate patient's response, side effects, and tolerance of medication regimen.  Therapeutic Interventions: 1 to 1 sessions, Unit Group sessions and Medication administration.  Evaluation of Outcomes: Not Progressing  RN Treatment Plan for Primary Diagnosis: Bipolar 1 disorder with moderate mania (HCC) Long Term Goal(s): Knowledge of disease and therapeutic regimen to maintain health will improve  Short Term Goals: Ability to remain free from injury will improve, Ability to verbalize frustration and anger appropriately will improve, Ability to demonstrate self-control, Ability to participate in decision making will improve, Ability to verbalize feelings will improve, Ability to disclose and discuss suicidal ideas, Ability to identify and develop effective coping behaviors will improve, and Compliance with prescribed medications will improve  Medication Management: RN will administer medications as  ordered by provider, will assess and evaluate patient's response and provide education to patient for prescribed medication. RN will report any adverse and/or side effects to prescribing provider.  Therapeutic Interventions: 1 on 1 counseling sessions, Psychoeducation, Medication administration, Evaluate responses to treatment, Monitor vital signs and CBGs as ordered, Perform/monitor CIWA, COWS, AIMS and Fall Risk screenings as ordered, Perform wound care treatments as ordered.  Evaluation of Outcomes: Not Progressing   LCSW Treatment Plan for Primary Diagnosis: Bipolar 1 disorder with moderate mania (HCC) Long Term Goal(s): Safe transition to appropriate next level of care at discharge, Engage patient in therapeutic group addressing interpersonal concerns.  Short Term Goals: Engage patient in aftercare planning with referrals and resources, Increase social support, Increase ability to appropriately verbalize feelings, Increase emotional regulation, Facilitate acceptance of mental health diagnosis and concerns, Facilitate patient progression through stages of change regarding substance use diagnoses and concerns, Identify triggers associated with mental health/substance abuse issues, and Increase skills for wellness and recovery  Therapeutic Interventions: Assess for all discharge needs, 1 to 1 time with Social worker, Explore available resources and support systems, Assess for adequacy in community support network, Educate family and significant other(s) on suicide prevention, Complete Psychosocial Assessment, Interpersonal group therapy.  Evaluation of Outcomes: Not Progressing   Progress in Treatment: Attending groups: No. Participating in groups: No. Taking medication as prescribed: Yes. Toleration medication: Yes. Family/Significant other contact made: declined  Patient understands diagnosis: No. Discussing patient identified problems/goals with staff: Yes. Medical problems stabilized  or resolved: Yes. Denies suicidal/homicidal ideation: Yes. Issues/concerns per patient self-inventory: No.   New problem(s) identified: No, Describe:  None Reported   New Short Term/Long Term Goal(s): medication stabilization, elimination of SI thoughts, development of comprehensive mental wellness plan.    Patient Goals:  " go off to college and get better"   Discharge Plan or Barriers: Patient recently admitted. CSW will continue to follow and assess for appropriate referrals and possible discharge planning.    Reason for Continuation of Hospitalization: Anxiety Delusions  Depression Medication stabilization  Estimated Length of Stay: 3-5 Days  Last 3 [place] Suicide Severity Risk Score: Flowsheet Row Admission (Current) [facility] from 06/23/2022 in [facility] Department at [facility] ED from 10/30/2021 in [facility] Emergency Department at [facility]  C-SSRS RISK CATEGORY No Risk No Risk No Risk       Last PHQ 2/9 Scores:    11/12/2021    1:28 PM 12/26/2020   10:38 AM 05/27/2016    2:22 PM  Depression screen PHQ 2/9  Decreased Interest 0 1 3  Down, Depressed, Hopeless 0 1 3  PHQ - 2 Score 0 2 6  Altered sleeping  2 1  Tired, decreased energy  2 3  Change in appetite  0 0  Feeling bad or failure about yourself   0 0  Trouble  concentrating  1 0  Moving slowly or fidgety/restless  0 0  Suicidal thoughts   0  PHQ-9 Score  7 10  Difficult doing work/chores  Somewhat difficult     Scribe for Treatment Team: Beather Arbour 06/25/2022 3:16 PM

## 2022-06-25 NOTE — Progress Notes (Signed)
Patient ID: Veronica Perez, female   DOB: 1975/10/26, 47 y.o.   MRN: [id number]                                                                          Progress note                                            Second opinion for non-emergency medications    Reason for the Medication: The patient, without the benefit of the specific treatment measure, is incapable of participating in any available treatment plan that will give the patient a realistic opportunity of improving the patient's condition.   Consideration of Side Effects: Consideration of the side effects related to the medication plan has been given.   Rationale for Medication Administration: Patient has shown improvement in past hospitalizations with medication management.  She decompensates after discharge when she becomes medication non-compliant.    The patient was seen and interviewed today she was alert, oriented x 3 and partially cooperative.  She maintained fair eye contact.  Her speech was rapid, mildly pressured and significant for looseness of associations, tangentiality and occasional flight of ideas.  Her thought content was significantly delusional and she denies ideas of persecution and believes that she is being observed through cameras.  She also talks vaguely about G forces and other noises implanted but related to harass her. She continues to ramble and gradually escalates into loose speech tinged with delusions.   At the present time, patient has no capacity to understand the need for treatment, refusing to take psychotropic medication, because of poor insight and judgment.  Patient states that they have no problem, no mental illness and refuses to consider taking medication, recommended by the psychiatrist Dr. Sherron Flemings.  Diagnosis: Schizoaffective disorder, bipolar type   Treatment plan/Opinion: -patient is mentally ill, as needs hospitalization -patient continues to display active symptoms of psychosis with  significant looseness of associations, paranoia and delusions.  -patient lacks the capacity to consent to treatment with medication   -patient is unable to rationally discuss medication options, risks versus benefit due to their symptoms. -treatment with medication and medication changes would be in their best interest -psychotropics are effective treatment for symptoms of psychosis   Based on my evaluation, the following medications are recommend and indicated for treatment of the patient's psychiatric diagnosis and symptoms: Risperdal 1 mg twice a day to be titrated as tolerated then converted to a long-acting injectable with possibly Invega.  However patient may not accept oral medications at this time and may require either Zyprexa or Haldol p.o. or IM for initial stabilization. Additional as needed medications as per Dr. Sherron Flemings.  Total Time Spent in Direct Patient Care:  I personally spent 20 minutes on the unit in direct patient care. The direct patient care time included face-to-face time with the patient, reviewing the patient's chart, communicating with other professionals, and coordinating care. Greater than 50% of this time was spent in counseling or coordinating care with the patient regarding goals of hospitalization, psycho-education, and discharge planning needs.   Veronica Perez [facility] Psychiatrist

## 2022-06-25 NOTE — Plan of Care (Signed)
°  Problem: Education: °Goal: Emotional status will improve °Outcome: Not Progressing °Goal: Mental status will improve °Outcome: Not Progressing °  °Problem: Health Behavior/Discharge Planning: °Goal: Compliance with treatment plan for underlying cause of condition will improve °Outcome: Not Progressing °  °

## 2022-06-25 NOTE — [facility] Suicide Risk Assessment (Signed)
[facility] INPATIENT:  Family/Significant Other Suicide Prevention Education  Suicide Prevention Education:  Patient Refusal for Family/Significant Other Suicide Prevention Education: The patient Veronica Perez has refused to provide written consent for family/significant other to be provided Family/Significant Other Suicide Prevention Education during admission and/or prior to discharge.  Physician notified.  Erandi Lemma E Latanza Pfefferkorn 06/25/2022, 1:53 PM

## 2022-06-25 NOTE — Progress Notes (Signed)
   06/25/22 0606  15 Minute Checks  Location Bedroom  Visual Appearance Calm  Behavior Sleeping  Sleep (Behavioral Health Patients Only)  Calculate sleep? (Click Yes once per 24 hr at 0600 safety check) Yes  Documented sleep last 24 hours 6.5

## 2022-06-25 NOTE — [facility] Suicide Risk Assessment (Signed)
Suicide Risk Assessment  Admission Assessment    [facility] Admission Suicide Risk Assessment   Nursing information obtained from:  Patient Demographic factors:  Unemployed, Low socioeconomic status Current Mental Status:  NA Loss Factors:  Financial problems / change in socioeconomic status Historical Factors:  NA Risk Reduction Factors:  Living with another person, especially a relative  Total Time spent with patient: 1.5 hours Principal Problem: Severe manic bipolar 1 disorder with psychotic behavior (HCC) Diagnosis:  Principal Problem:   Severe manic bipolar 1 disorder with psychotic behavior (HCC) Active Problems:   Hyperthyroidism   GAD (generalized anxiety disorder)   Insomnia  Subjective Data: "The roads in front of my house are lethal. There are thousands upon thousands of cars lined up there. It was written in the law for there not to be boom boxes on the road for reasons. They make vacuum sounds like cleaners, earthquakes, bombs dropping. I can't go anywhere in [place]. It's boom, boom, [address]. It doesn't belong on the roads, causes hostility.Marland KitchenMarland KitchenBoom cars need to become illegal.They belong to terrorist organizations from [employer] and are out to kill me for a bounty of a hundred thousand dollars or to get me to commit suicide so they can get the hundred thousand."   Continued Clinical Symptoms: Currently psychotic with an altered mental status requiring continuous hospitalization for treatment and stabilization.  Has not slept in a few days as per collateral information obtained in the ER and has not been taking care of personal hygiene needs.  Current symptoms render patient at the risk of danger to self and other people and continuous hospitalization is required.   The "Alcohol Use Disorders Identification Test", Guidelines for Use in Primary Care, Second Edition.  World Science writer [facility]). Score between 0-7:  no or low risk or alcohol related problems. Score  between 8-15:  moderate risk of alcohol related problems. Score between 16-19:  high risk of alcohol related problems. Score 20 or above:  warrants further diagnostic evaluation for alcohol dependence and treatment.  CLINICAL FACTORS:   More than one psychiatric diagnosis Currently Psychotic Previous Psychiatric Diagnoses and Treatments Medical Diagnoses and Treatments/Surgeries   Musculoskeletal: Strength & Muscle Tone: within normal limits Gait & Station: normal Patient leans: N/A  Psychiatric Specialty Exam:  Presentation  General Appearance:  Fairly Groomed  Eye Contact: Good  Speech: Pressured  Speech Volume: Increased  Handedness: Right   Mood and Affect  Mood: Anxious  Affect: Congruent   Thought Process  Thought Processes: Disorganized; Irrevelant  Descriptions of Associations:Tangential  Orientation:Partial  Thought Content:Illogical  History of Schizophrenia/Schizoaffective disorder:Yes  Duration of Psychotic Symptoms:Greater than six months  Hallucinations:Hallucinations: None  Ideas of Reference:Delusions; Paranoia; Percusatory  Suicidal Thoughts:Suicidal Thoughts: No  Homicidal Thoughts:Homicidal Thoughts: No   Sensorium  Memory: Immediate Good  Judgment: Poor  Insight: Poor  Executive Functions  Concentration: Poor  Attention Span: Poor  Recall: Poor  Fund of Knowledge: Poor  Language: Poor   Psychomotor Activity  Psychomotor Activity: Psychomotor Activity: Normal  Assets  Assets: Resilience  Sleep  Sleep: Sleep: Poor  Physical Exam: Physical Exam Review of Systems  Psychiatric/Behavioral:  Positive for depression and hallucinations. Negative for memory loss, substance abuse and suicidal ideas. The patient is nervous/anxious and has insomnia.    Blood pressure (!) 142/75, pulse 89, temperature 98.6 F (37 C), temperature source Oral, resp. rate 16, height 5\' 5"  (1.651 m), weight 88 kg, SpO2 99  %. Body mass index is 32.28 kg/m.  COGNITIVE FEATURES THAT CONTRIBUTE TO RISK:  None    SUICIDE RISK:   Mild:  There are no identifiable suicide plans, no associated intent, mild dysphoria and related symptoms, good self-control (both objective and subjective assessment), few other risk factors, and identifiable protective factors, including available and accessible social support.   PLAN OF CARE: See H & P  I certify that inpatient services furnished can reasonably be expected to improve the patient's condition.   Starleen Blue, NP 06/25/2022, 6:57 PM
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
## 2022-06-25 NOTE — Progress Notes (Signed)
Pt refused AM standing vital signs

## 2022-06-25 NOTE — Progress Notes (Signed)
Patient refused Propanolol. Stated, "No, I'm not taking it. I deal with my blood pressure by myself at home."

## 2022-06-25 NOTE — Progress Notes (Signed)
   06/25/22 1327  Psych Admission Type (Psych Patients Only)  Admission Status Involuntary  Psychosocial Assessment  Patient Complaints Suspiciousness;Worrying  Eye Contact Fair  Facial Expression Anxious;Worried  Affect Preoccupied  Actuary Activity Pacing;Fidgety  Appearance/Hygiene Unremarkable  Behavior Characteristics Anxious;Fidgety;Pacing;Resistant to care  Mood Anxious;Labile;Apprehensive;Preoccupied  Thought Process  Coherency Disorganized;Tangential  Content Blaming others;Preoccupation  Delusions Paranoid;Persecutory  Perception Derealization  Hallucination None reported or observed  Judgment Impaired  Confusion Mild  Danger to Self  Current suicidal ideation? Denies  Danger to Others  Danger to Others None reported or observed

## 2022-06-25 NOTE — Progress Notes (Signed)
Patient refused risperidone. Became upset with staff yelling and cursing. Patient stated, "I shouldn't have to take medication when it's people that are at my house trying to get me!" Patient then went into her room yelling and talking to herself.

## 2022-06-26 DIAGNOSIS — F312 Bipolar disorder, current episode manic severe with psychotic features: Secondary | ICD-10-CM | POA: Diagnosis not present

## 2022-06-26 MED ORDER — RISPERIDONE 1 MG/ML PO SOLN
2.0000 mg | Freq: Two times a day (BID) | ORAL | Status: DC
  Administered 2022-06-27 – 2022-06-28 (×4): 2 mg via ORAL
  Filled 2022-06-26 (×8): qty 2

## 2022-06-26 MED ORDER — HALOPERIDOL LACTATE 5 MG/ML IJ SOLN
10.0000 mg | Freq: Two times a day (BID) | INTRAMUSCULAR | Status: DC
  Filled 2022-06-26 (×8): qty 2

## 2022-06-26 MED ORDER — BENZTROPINE MESYLATE 1 MG/ML IJ SOLN: 1.0000 mg | Freq: Two times a day (BID) | INTRAMUSCULAR | Status: DC | PRN

## 2022-06-26 MED ORDER — BENZTROPINE MESYLATE 1 MG PO TABS: 1.0000 mg | ORAL_TABLET | Freq: Two times a day (BID) | ORAL | Status: DC | PRN

## 2022-06-26 NOTE — Progress Notes (Signed)
Adult Psychoeducational Group Note  Date:  06/26/2022 Time:  9:10 PM  Group Topic/Focus:  Wrap-Up Group:   The focus of this group is to help patients review their daily goal of treatment and discuss progress on daily workbooks.  Participation Level:  Did Not Attend  Participation Quality:    Affect:    Cognitive:    Insight:   Engagement in Group:    Modes of Intervention:    Additional Comments:  Pt did not attend group today  Satira Anis 06/26/2022, 9:10 PM

## 2022-06-26 NOTE — Progress Notes (Signed)
   06/26/22 0548  15 Minute Checks  Location Bedroom  Visual Appearance Calm  Behavior Sleeping  Sleep (Behavioral Health Patients Only)  Calculate sleep? (Click Yes once per 24 hr at 0600 safety check) Yes  Documented sleep last 24 hours 5.5

## 2022-06-26 NOTE — Progress Notes (Signed)
[facility] MD Progress Note  06/26/2022 4:57 PM Veronica Perez  MRN:  [id number] Principal Problem: Severe manic bipolar 1 disorder with psychotic behavior (HCC) Diagnosis: Principal Problem:   Severe manic bipolar 1 disorder with psychotic behavior (HCC) Active Problems:   Hyperthyroidism   GAD (generalized anxiety disorder)   Insomnia  Reason For Hospitalization: Veronica Perez is a 47 year old Caucasian female with prior mental health history of bipolar 1 disorder who presented to the [facility] ER on 5/20 manic with pressured speech and disoriented, stating that she was taken today by [employer], and unable to state her reason for the presentation. As per. Collateral information obtained by the ED provider from pt's family, patient has been taking very poor care of herself most recently, has not been sleeping, has been disoriented, and they have repeatedly called law enforcement due to the concerns of patient being a danger to herself and others over the past couple of days, and law enforcement has refused to intervene.  As per EDP notes, patient's family reported not knowing how the patient got to the ER.  Patient was involuntarily committed in the ER due to concerns of her being a danger to self and others due to her altered mental status prior to being transferred to this behavioral health Hospital on 05/21 for treatment and stabilization of her mental status.   24 hr chart review: Forced medication order implemented yesterday due to patient's refusal to take medications willingly.  Psychosis persisted overnight, but no agitation protocol medications given.  Patient has been taking the risperidone 1 mg solution, but has been refusing his scheduled dose of Ativan.  Vital signs with some intermittent tachycardia due to refusals to take Inderal.  Patient assessment note: During encounter with patient, she psychosis is persistent; she presents with delusions of persecution & paranoia; continues to state  that the "boom cars" on her road belong to members of terrorist organizations, and they are out to harm her. She also states that one of her neighbors has done things that have negatively impacted on her, and that she is a target. She states that the boom cars have been on "the [place] news", and she has the evidence. She is perseverative about people out to harm her during entire assessment, and presents with disorganized, illogical thinking and flight of ideas. Speech is less pressured today, but she remains hyper verbal.   Pt reports that her sleep quality last night was poor, states that the medications are causing her insomnia. She states repeatedly "these are not my medications." She denies SI, denies AVH, reports that she had a bowel movement yesterday. Reports that her appetite is better today and she is eating more.   We are increasing Risperdal to 2 mg nightly and 2 mg in the mornings. Pt is to receive  10 mg Haldol each each time that she does not take the PO.  Total Time spent with patient: 45 minutes  Past Psychiatric History: See H & P  Past Medical History:  Past Medical History:  Diagnosis Date   Bipolar disorder (HCC)    Depression    Former smoker, stopped smoking many years ago    HSV (herpes simplex virus) infection    Hypothyroid    Low TSH level 05/30/2016   Panic attacks    Thyroid disease     Past Surgical History:  Procedure Laterality Date   ADENOIDECTOMY     OVARIAN CYST SURGERY     TONSILLECTOMY  Family History:  Family History  Problem Relation Age of Onset   Diabetes Father    Heart disease Father    Cancer Maternal Grandmother        breast cancer   Bipolar disorder Mother    Thyroid disease Neg Hx    Family Psychiatric  History: See H & P Social History:  Social History   Substance and Sexual Activity  Alcohol Use Not Currently   Comment: denies     Social History   Substance and Sexual Activity  Drug Use No    Social History    Socioeconomic History   Marital status: Single    Spouse name: Not on file   Number of children: 1   Years of education: Not on file   Highest education level: High school graduate  Occupational History   Not on file  Tobacco Use   Smoking status: Every Day    Packs/day: .25    Types: Cigarettes   Smokeless tobacco: Never  Vaping Use   Vaping Use: Never used  Substance and Sexual Activity   Alcohol use: Not Currently    Comment: denies   Drug use: No   Sexual activity: Yes    Birth control/protection: None  Other Topics Concern   Not on file  Social History Narrative   Not on file   Social Determinants of Health   Financial Resource Strain: Not on file  Food Insecurity: Patient Declined (06/24/2022)   Hunger Vital Sign    Worried About Running Out of Food in the Last Year: Patient declined    Ran Out of Food in the Last Year: Patient declined  Transportation Needs: Patient Declined (06/24/2022)   PRAPARE - Administrator, Civil Service (Medical): Patient declined    Lack of Transportation (Non-Medical): Patient declined  Physical Activity: Not on file  Stress: Not on file  Social Connections: Not on file   Sleep: Poor  Appetite:  Fair  Current Medications: Current Facility-Administered Medications  Medication Dose Route Frequency Provider Last Rate Last Admin   acetaminophen (TYLENOL) tablet 650 mg  650 mg Oral Q6H PRN Eligha Bridegroom, NP       alum & mag hydroxide-simeth (MAALOX/MYLANTA) 200-200-20 MG/5ML suspension 30 mL  30 mL Oral Q4H PRN Eligha Bridegroom, NP       benztropine (COGENTIN) tablet 1 mg  1 mg Oral BID PRN Starleen Blue, NP       Or   benztropine mesylate (COGENTIN) injection 1 mg  1 mg Intramuscular BID PRN Starleen Blue, NP       diphenhydrAMINE (BENADRYL) capsule 50 mg  50 mg Oral TID PRN Eligha Bridegroom, NP       Or   diphenhydrAMINE (BENADRYL) injection 50 mg  50 mg Intramuscular TID PRN Eligha Bridegroom, NP        haloperidol (HALDOL) tablet 5 mg  5 mg Oral TID PRN Eligha Bridegroom, NP       Or   haloperidol lactate (HALDOL) injection 5 mg  5 mg Intramuscular TID PRN Eligha Bridegroom, NP       risperiDONE (RISPERDAL) 1 MG/ML oral solution 2 mg  2 mg Oral Q12H Langdon Crosson, NP       Or   haloperidol lactate (HALDOL) injection 10 mg  10 mg Intramuscular Q12H Fadi Menter, NP       hydrOXYzine (ATARAX) tablet 25 mg  25 mg Oral TID PRN Phineas Inches, MD       LORazepam (ATIVAN)  tablet 1 mg  1 mg Oral TID PRN Eligha Bridegroom, NP       Or   LORazepam (ATIVAN) injection 1 mg  1 mg Intramuscular TID PRN Eligha Bridegroom, NP       LORazepam (ATIVAN) tablet 1 mg  1 mg Oral BID Massengill, Nathan, MD       magnesium hydroxide (MILK OF MAGNESIA) suspension 30 mL  30 mL Oral Daily PRN Eligha Bridegroom, NP       propranolol (INDERAL) tablet 10 mg  10 mg Oral Q12H Massengill, Harrold Donath, MD   10 mg at 06/26/22 1610   traZODone (DESYREL) tablet 50 mg  50 mg Oral QHS PRN Massengill, Harrold Donath, MD        Lab Results:  Results for orders placed or performed during the hospital encounter of 06/24/22 (from the past 48 hour(s))  Rapid urine drug screen (hospital performed)     Status: Abnormal   Collection Time: 06/25/22  6:44 PM  Result Value Ref Range   Opiates NONE DETECTED NONE DETECTED   Cocaine NONE DETECTED NONE DETECTED   Benzodiazepines POSITIVE (A) NONE DETECTED   Amphetamines NONE DETECTED NONE DETECTED   Tetrahydrocannabinol POSITIVE (A) NONE DETECTED   Barbiturates NONE DETECTED NONE DETECTED    Comment: (NOTE) DRUG SCREEN FOR MEDICAL PURPOSES ONLY.  IF CONFIRMATION IS NEEDED FOR ANY PURPOSE, NOTIFY LAB WITHIN 5 DAYS.  LOWEST DETECTABLE LIMITS FOR URINE DRUG SCREEN Drug Class                     Cutoff (ng/mL) Amphetamine and metabolites    1000 Barbiturate and metabolites    200 Benzodiazepine                 200 Opiates and metabolites        300 Cocaine and metabolites        300 THC                             50 Performed at [facility], [address] [place], [place] [address]     Blood Alcohol level:  Lab Results  Component Value Date   [facility] <10 06/23/2022   ETH <10 11/25/2020    Metabolic Disorder Labs: Lab Results  Component Value Date   HGBA1C 5.4 11/12/2021   MPG 96.8 11/28/2020   MPG 96.8 06/28/2019   Lab Results  Component Value Date   PROLACTIN 13.1 06/28/2019   PROLACTIN 69.3 (H) 09/17/2016   Lab Results  Component Value Date   CHOL 141 11/28/2020   TRIG 214 (H) 11/28/2020   HDL 40 (L) 11/28/2020   CHOLHDL 3.5 11/28/2020   VLDL 43 (H) 11/28/2020   LDLCALC 58 11/28/2020   LDLCALC 103 (H) 06/28/2019    Physical Findings: AIMS: Facial and Oral Movements Muscles of Facial Expression: None, normal Lips and Perioral Area: None, normal Jaw: None, normal Tongue: None, normal,Extremity Movements Upper (arms, wrists, hands, fingers): None, normal Lower (legs, knees, ankles, toes): None, normal, Trunk Movements Neck, shoulders, hips: None, normal, Overall Severity Severity of abnormal movements (highest score from questions above): None, normal Incapacitation due to abnormal movements: None, normal Patient's awareness of abnormal movements (rate only patient's report): No Awareness, Dental Status Current problems with teeth and/or dentures?: No Does patient usually wear dentures?: No  CIWA:    COWS:     Musculoskeletal: Strength & Muscle Tone: within normal limits Gait &  Station: normal Patient leans: N/A  Psychiatric Specialty Exam:  Presentation  General Appearance:  Disheveled  Eye Contact: Good  Speech: Pressured  Speech Volume: Increased  Handedness: Right   Mood and Affect  Mood: Anxious  Affect: Congruent   Thought Process  Thought Processes: Disorganized  Descriptions of Associations:Tangential  Orientation:Partial  Thought Content:Illogical  History of  Schizophrenia/Schizoaffective disorder:No  Duration of Psychotic Symptoms:Greater than six months  Hallucinations:Hallucinations: None  Ideas of Reference:Paranoia; Delusions; Percusatory  Suicidal Thoughts:Suicidal Thoughts: No  Homicidal Thoughts:Homicidal Thoughts: No   Sensorium  Memory: Immediate Good  Judgment: Poor  Insight: Poor   Executive Functions  Concentration: Poor  Attention Span: Poor  Recall: Poor  Fund of Knowledge: Poor  Language: Poor   Psychomotor Activity  Psychomotor Activity: Psychomotor Activity: Normal   Assets  Assets: Resilience   Sleep  Sleep: Sleep: Poor    Physical Exam: Physical Exam Constitutional:      Appearance: Normal appearance.  HENT:     Head: Normocephalic.     Nose: Nose normal.  Eyes:     Pupils: Pupils are equal, round, and reactive to light.  Musculoskeletal:        General: Normal range of motion.     Cervical back: Normal range of motion.  Neurological:     Mental Status: She is alert.    Review of Systems  Constitutional:  Negative for fever.  HENT:  Negative for hearing loss.   Eyes:  Negative for blurred vision.  Respiratory:  Negative for cough.   Cardiovascular:  Negative for chest pain.  Gastrointestinal:  Negative for heartburn.  Genitourinary:  Negative for dysuria.  Musculoskeletal:  Negative for myalgias.  Skin:  Negative for rash.  Neurological:  Negative for dizziness.  Psychiatric/Behavioral:  Positive for hallucinations. Negative for depression, memory loss, substance abuse and suicidal ideas. The patient is nervous/anxious and has insomnia.    Blood pressure (!) 149/91, pulse 87, temperature 98.3 F (36.8 C), temperature source Oral, resp. rate 16, height 5\' 5"  (1.651 m), weight 88 kg, SpO2 99 %. Body mass index is 32.28 kg/m.  Treatment Plan Summary: Daily contact with patient to assess and evaluate symptoms and progress in treatment, Medication management, and  Plan     Safety and Monitoring: Voluntary admission to inpatient psychiatric unit for safety, stabilization and treatment Daily contact with patient to assess and evaluate symptoms and progress in treatment Patient's case to be discussed in multi-disciplinary team meeting Observation Level : q15 minute checks Vital signs: q12 hours Precautions: Safety   Long Term Goal(s): Improvement in symptoms so as ready for discharge   Short Term Goals: Ability to identify changes in lifestyle to reduce recurrence of condition will improve, Ability to verbalize feelings will improve, Ability to disclose and discuss suicidal ideas, Ability to demonstrate self-control will improve, Ability to identify and develop effective coping behaviors will improve, Ability to maintain clinical measurements within normal limits will improve, and Compliance with prescribed medications will improve   Diagnoses Principal Problem:   Severe manic bipolar 1 disorder with psychotic behavior (HCC) Active Problems:   Hyperthyroidism   GAD (generalized anxiety disorder)   Insomnia   Labs: Orders placed by attending psychiatrist for hemoglobin A1c: Lipid panel, urine drug screen, TSH and free T4 to be be drawn today, but still pending.   Medications -Continue forced medication order due to patient's inability to comply voluntarily with p.o. meds.  Dr. Sherron Flemings & and Dr. Enedina Finner both assessed patient and agreed with  implementation of forced medication order on 5/24 with the goal of betterment of patient's mental status.  Forced medication order is as follows:  -Increase Risperidone to 2 mg oral solution every 12 hours. IF PATIENT REFUSES this medication each time it is offered, give Haldol 10 mg IM + Cogentin 1 mg IM   Only forced medication is medication listed above.  Other medications are as follows: -Continue propranolol 10 mg every 12 hours for anxiety -Continue trazodone 50 mg nightly as needed for sleep -Continue  hydroxyzine 25 mg 3 times daily as needed for anxiety -Continue Ativan 1 mg BID for GAD (home med) -Continue agitation protocol medication: Haldol/Benadryl/Ativan 3 times daily as needed for agitation   Other PRNS -Continue Tylenol 650 mg every 6 hours PRN for mild pain -Continue Maalox 30 mg every 4 hrs PRN for indigestion -Continue Milk of Magnesia as needed every 6 hrs for constipation   Discharge Planning: Social work and case management to assist with discharge planning and identification of hospital follow-up needs prior to discharge Estimated LOS: 5-7 days Discharge Concerns: Need to establish a safety plan; Medication compliance and effectiveness Discharge Goals: Return home with outpatient referrals for mental health follow-up including medication management/psychotherapy   I certify that inpatient services furnished can reasonably be expected to improve the patient's condition.    Starleen Blue, NP 06/26/2022, 4:57 PM

## 2022-06-26 NOTE — Group Note (Signed)
Date:  06/26/2022 Time:  10:38 AM  Group Topic/Focus:  Goals Group:   The focus of this group is to help patients establish daily goals to achieve during treatment and discuss how the patient can incorporate goal setting into their daily lives to aide in recovery. Orientation:   The focus of this group is to educate the patient on the purpose and policies of crisis stabilization and provide a format to answer questions about their admission.  The group details unit policies and expectations of patients while admitted.   Participation Level:  Did Not Attend  Sonny Dandy Tydus Sanmiguel 06/26/2022, 10:38 AM

## 2022-06-26 NOTE — Progress Notes (Signed)
   06/26/22 1000  Psych Admission Type (Psych Patients Only)  Admission Status Involuntary  Psychosocial Assessment  Patient Complaints Agitation  Eye Contact Fair  Facial Expression Angry  Affect Angry;Labile;Irritable  Speech Loud;Pressured  Interaction Hostile  Motor Activity Fidgety  Appearance/Hygiene In scrubs  Behavior Characteristics Unwilling to participate;Agitated  Mood Labile;Angry  Thought Process  Coherency Disorganized;Flight of ideas  Content Blaming others;Paranoia  Delusions Paranoid;Persecutory  Perception Derealization  Hallucination None reported or observed  Judgment Impaired  Confusion Mild  Danger to Self  Current suicidal ideation? Denies  Danger to Others  Danger to Others None reported or observed

## 2022-06-26 NOTE — Group Note (Signed)
Occupational Therapy Group Note  Group Topic: Sleep Hygiene  Group Date: 06/26/2022 Start Time: 1410 End Time: 1435 Facilitators: Ferdinand Revoir G, OT   Group Description: Group encouraged increased participation and engagement through topic focused on sleep hygiene. Patients reflected on the quality of sleep they typically receive and identified areas that need improvement. Group was given background information on sleep and sleep hygiene, including common sleep disorders. Group members also received information on how to improve one's sleep and introduced a sleep diary as a tool that can be utilized to track sleep quality over a length of time. Group session ended with patients identifying one or more strategies they could utilize or implement into their sleep routine in order to improve overall sleep quality.        Therapeutic Goal(s):  Identify one or more strategies to improve overall sleep hygiene  Identify one or more areas of sleep that are negatively impacted (sleep too much, too little, etc)     Participation Level: Engaged   Participation Quality: Independent   Behavior: Appropriate   Speech/Thought Process: Relevant   Affect/Mood: Appropriate   Insight: Fair   Judgement: Fair      Modes of Intervention: Education  Patient Response to Interventions:  Attentive   Plan: Continue to engage patient in OT groups 2 - 3x/week.  06/26/2022  Veronica Perez, OT   Veronica Perez, OT  

## 2022-06-26 NOTE — Progress Notes (Signed)
DAR NOTE: Patient presents with anxious affect and depressed mood.  Denies pain, auditory and visual hallucinations.  Rates depression at 2, hopelessness at 2, and anxiety at 4.  Maintained on routine safety checks.  Medications given as prescribed.  Support and encouragement offered as needed.  Attended group and participated.    Patient observed socializing with peers in the dayroom.  Offered no complaint.

## 2022-06-27 DIAGNOSIS — F312 Bipolar disorder, current episode manic severe with psychotic features: Secondary | ICD-10-CM | POA: Diagnosis not present

## 2022-06-27 LAB — LIPID PANEL
Cholesterol: 122 mg/dL (ref 0–200)
HDL: 31 mg/dL — ABNORMAL LOW (ref >40)
LDL Cholesterol: 59 mg/dL (ref 0–99)
Total CHOL/HDL Ratio: 3.9 RATIO
Triglycerides: 162 mg/dL — ABNORMAL HIGH (ref <150)
VLDL: 32 mg/dL (ref 0–40)

## 2022-06-27 LAB — T4, FREE: Free T4: 2.71 ng/dL — ABNORMAL HIGH (ref 0.61–1.12)

## 2022-06-27 LAB — TSH: TSH: <0.01 u[IU]/mL — ABNORMAL LOW (ref 0.350–4.500)

## 2022-06-27 NOTE — [facility] Group Notes (Signed)
Spirituality group facilitated by Kathleen Argue, [facility].  Group Description: Group focused on topic of hope. Patients participated in facilitated discussion around topic, connecting with one another around experiences and definitions for hope. Group members engaged with visual explorer photos, reflecting on what hope looks like for them today. Group engaged in discussion around how their definitions of hope are present today in hospital.  Modalities: Psycho-social ed, Adlerian, Narrative, MI  Patient Progress: Veronica Perez attended group and actively engaged and participated in group conversation and activities.  Her comments were on topic and appropriate.

## 2022-06-27 NOTE — Group Note (Signed)
Date:  06/27/2022 Time:  11:12 AM  Group Topic/Focus:  Goals Group:   The focus of this group is to help patients establish daily goals to achieve during treatment and discuss how the patient can incorporate goal setting into their daily lives to aide in recovery. Orientation:   The focus of this group is to educate the patient on the purpose and policies of crisis stabilization and provide a format to answer questions about their admission.  The group details unit policies and expectations of patients while admitted.    Participation Level:  Active  Participation Quality:  Appropriate  Affect:  Appropriate  Cognitive:  Appropriate  Insight: Appropriate  Engagement in Group:  Engaged  Modes of Intervention:  Discussion  Additional Comments:  Patient attended morning orientation/goal setting group and said that her goal for today is to do her personal hygiene and attend groups. Sonny Dandy Babe Clenney 06/27/2022, 11:12 AM

## 2022-06-27 NOTE — Progress Notes (Signed)
   06/27/22 0554  15 Minute Checks  Location Bedroom  Visual Appearance Calm  Behavior Sleeping  Sleep (Behavioral Health Patients Only)  Calculate sleep? (Click Yes once per 24 hr at 0600 safety check) Yes  Documented sleep last 24 hours 9.5

## 2022-06-27 NOTE — [facility] Group Notes (Signed)
Adult Psychoeducational Group Note  Date:  06/27/2022 Time:  8:33 PM  Group Topic/Focus:  Wrap-Up Group:   The focus of this group is to help patients review their daily goal of treatment and discuss progress on daily workbooks.  Participation Level:  Did Not Attend  Participation Quality:    Affect:    Cognitive:    Insight:   Engagement in Group:    Modes of Intervention:    Additional Comments:    Domanic Matusek J Marg Macmaster 06/27/2022, 8:33 PM 

## 2022-06-27 NOTE — Progress Notes (Signed)
Adult Psychoeducational Group Note  Date:  06/27/2022 Time:  2:49 PM  Group Topic/Focus:  Healthy Communication:   The focus of this group is to discuss communication, barriers to communication, as well as healthy ways to communicate with others.  Participation Level:  Active  Participation Quality:  Appropriate  Affect:  Appropriate  Cognitive:  Appropriate  Insight: Appropriate  Engagement in Group:  Engaged  Modes of Intervention:  Discussion  Additional Comments:The patyient engaged in group.  Octavio Manns 06/27/2022, 2:49 PM

## 2022-06-27 NOTE — Progress Notes (Signed)
Patient in irritable mood.  Denies SI, HI, AVH. Denies having any issues and states she does not belong in this facility. Pt heard talking loudly in her room and is very argumentative with staff.    Scheduled medications administered to patient, per MD orders. Pt refused some of her bedtime medications. Routine safety checks conducted every 15 minutes.  Patient informed to notify staff with problems or concerns.     06/27/22 0045  Psych Admission Type (Psych Patients Only)  Admission Status Involuntary  Psychosocial Assessment  Patient Complaints Agitation;Anxiety;Confusion;Irritability  Eye Contact Fair  Facial Expression Angry  Affect Angry;Anxious;Irritable;Labile  Speech Argumentative;Rapid;Pressured;Loud;Tangential  Interaction Demanding;Dominating  Motor Activity Fidgety  Appearance/Hygiene Unremarkable  Behavior Characteristics Agitated;Anxious;Fidgety;Irritable;Resistant to care  Mood Anxious;Labile;Angry;Apprehensive;Irritable  Thought Process  Coherency Disorganized;Tangential  Content Blaming others;Paranoia  Delusions Paranoid;Persecutory  Perception WDL  Hallucination None reported or observed  Judgment Impaired  Confusion Mild  Danger to Self  Current suicidal ideation? Denies  Danger to Others  Danger to Others None reported or observed

## 2022-06-27 NOTE — Progress Notes (Signed)
[facility] MD Progress Note  06/27/2022 8:13 PM Veronica Perez  MRN:  [id number] Principal Problem: Severe manic bipolar 1 disorder with psychotic behavior (HCC) Diagnosis: Principal Problem:   Severe manic bipolar 1 disorder with psychotic behavior (HCC) Active Problems:   Hyperthyroidism   GAD (generalized anxiety disorder)   Insomnia  Reason For Hospitalization: Veronica Perez is a 46 year old Caucasian female with past psychiatric history of bipolar 1 disorder who presented to the [facility] on 06/23/22  with disorganized and pressured speech, disoriented unable to state how or reason for presenting to ED. Per collateral information obtained by the ED provider from pt's family, patient had not been adequately caring for herself, experiencing insomnia, disorientation, and repeated calls to law enforcement reporting she was in danger. Per EDP notes, patient's family reported not knowing how the patient got to the ER. She was IVC'd in ED for concern of her being a danger to self and others related to her altered mental status. She was assessed and recommended for inpatient hospitalization; transferred to [facility] on 05/21 for further stabilization and treatment.   24 hr chart review: Staff report remains delusional reporting her neighbors are making her backpain worse. She remains paranoid about medications; forced medication orders remain in place. Irritable noted in interactions with staff. She has been taking scheduled medications with no noted side effects including Ativan she was previously refusing.  Vital signs continue to reflect some intermittent tachycardia at PR 91, bp 155/66; did take scheduled propanolol 10 mg this morning. Per chart review, pt filled Lorazepam 2 mg 60 pills filled 06/03/22; remains on Lorazepam 1 mg BID.   Patient assessment note: On assessment she presents slightly disheveled. Alert and oriented to person, month, and year. Speech is pressured and tangential. Mood is euthymic,  affect incongruent. Thought processes are scattered; content is delusional, persecutory. She continues to perseverate on her neighbor 'in [address]' harassing and targeting her. She reports reason for admission as 'I got outnumbered' then begins speaking about her neighbor. She denies any history of bipolar and states she only has ADHD, PTSD, and anxiety; mentions several times the need for Xanax. She is only able to speak on another topic briefly where she briefly mentions having a 81 year old daughter that lives in the home with her then reverts back to subject of her neighbor with some report of 'boom cars' riding on the road by her house being terrorists spying on her. Refers to them as '[employer]'. States staff don't like her and are forcing her to take medication despite not having bipolar, 'they're not mine (medications) but I'll take them'. Says the Risperidone causes high pitch sounds in her ears as soon as she swallows it. She denies any SI/HI; unclear of AVH at this time. She remains delusional and paranoid.   Care reviewed with attending MD. Patient continues to take Risperdone 2 mg PO solution BID daily for psychotic symptoms; increased 06/26/22 solution BID for psychotic symptoms with no obvious side effects. She was able to sleep overnight and has not required forced medications noting partial improvement. No medication changes warranted at this time. Awaiting Free T4 and HgbA1c levels to. Agitation orders remain in place for any breakthrough symptoms.    Total Time spent with patient: 45 minutes  Past Psychiatric History: See H & P  Past Medical History:  Past Medical History:  Diagnosis Date   Bipolar disorder (HCC)    Depression    Former smoker, stopped smoking many years ago  HSV (herpes simplex virus) infection    Hypothyroid    Low TSH level 05/30/2016   Panic attacks    Thyroid disease     Past Surgical History:  Procedure Laterality Date   ADENOIDECTOMY     OVARIAN  CYST SURGERY     TONSILLECTOMY     Family History:  Family History  Problem Relation Age of Onset   Diabetes Father    Heart disease Father    Cancer Maternal Grandmother        breast cancer   Bipolar disorder Mother    Thyroid disease Neg Hx    Family Psychiatric  History: See H & P Social History:  Social History   Substance and Sexual Activity  Alcohol Use Not Currently   Comment: denies     Social History   Substance and Sexual Activity  Drug Use No    Social History   Socioeconomic History   Marital status: Single    Spouse name: Not on file   Number of children: 1   Years of education: Not on file   Highest education level: High school graduate  Occupational History   Not on file  Tobacco Use   Smoking status: Every Day    Packs/day: .25    Types: Cigarettes   Smokeless tobacco: Never  Vaping Use   Vaping Use: Never used  Substance and Sexual Activity   Alcohol use: Not Currently    Comment: denies   Drug use: No   Sexual activity: Yes    Birth control/protection: None  Other Topics Concern   Not on file  Social History Narrative   Not on file   Social Determinants of Health   Financial Resource Strain: Not on file  Food Insecurity: Patient Declined (06/24/2022)   Hunger Vital Sign    Worried About Running Out of Food in the Last Year: Patient declined    Ran Out of Food in the Last Year: Patient declined  Transportation Needs: Patient Declined (06/24/2022)   PRAPARE - Administrator, Civil Service (Medical): Patient declined    Lack of Transportation (Non-Medical): Patient declined  Physical Activity: Not on file  Stress: Not on file  Social Connections: Not on file   Sleep: Poor  Appetite:  Fair  Current Medications: Current Facility-Administered Medications  Medication Dose Route Frequency Provider Last Rate Last Admin   acetaminophen (TYLENOL) tablet 650 mg  650 mg Oral Q6H PRN Eligha Bridegroom, NP       alum & mag  hydroxide-simeth (MAALOX/MYLANTA) 200-200-20 MG/5ML suspension 30 mL  30 mL Oral Q4H PRN Eligha Bridegroom, NP       benztropine (COGENTIN) tablet 1 mg  1 mg Oral BID PRN Starleen Blue, NP       Or   benztropine mesylate (COGENTIN) injection 1 mg  1 mg Intramuscular BID PRN Starleen Blue, NP       diphenhydrAMINE (BENADRYL) capsule 50 mg  50 mg Oral TID PRN Eligha Bridegroom, NP       Or   diphenhydrAMINE (BENADRYL) injection 50 mg  50 mg Intramuscular TID PRN Eligha Bridegroom, NP       haloperidol (HALDOL) tablet 5 mg  5 mg Oral TID PRN Eligha Bridegroom, NP       Or   haloperidol lactate (HALDOL) injection 5 mg  5 mg Intramuscular TID PRN Eligha Bridegroom, NP       risperiDONE (RISPERDAL) 1 MG/ML oral solution 2 mg  2 mg Oral  Q12H Starleen Blue, NP   2 mg at 06/27/22 1610   Or   haloperidol lactate (HALDOL) injection 10 mg  10 mg Intramuscular Q12H Starleen Blue, NP       hydrOXYzine (ATARAX) tablet 25 mg  25 mg Oral TID PRN Massengill, Harrold Donath, MD       LORazepam (ATIVAN) tablet 1 mg  1 mg Oral TID PRN Eligha Bridegroom, NP       Or   LORazepam (ATIVAN) injection 1 mg  1 mg Intramuscular TID PRN Eligha Bridegroom, NP       LORazepam (ATIVAN) tablet 1 mg  1 mg Oral BID Massengill, Harrold Donath, MD   1 mg at 06/27/22 9604   magnesium hydroxide (MILK OF MAGNESIA) suspension 30 mL  30 mL Oral Daily PRN Eligha Bridegroom, NP       propranolol (INDERAL) tablet 10 mg  10 mg Oral Q12H Massengill, Harrold Donath, MD   10 mg at 06/27/22 5409   traZODone (DESYREL) tablet 50 mg  50 mg Oral QHS PRN Massengill, Harrold Donath, MD        Lab Results:  Results for orders placed or performed during the hospital encounter of 06/24/22 (from the past 48 hour(s))  TSH     Status: Abnormal   Collection Time: 06/27/22  6:11 PM  Result Value Ref Range   TSH <0.010 (L) 0.350 - 4.500 uIU/mL    Comment: Performed by a 3rd Generation assay with a functional sensitivity of <=0.01 uIU/mL. Performed at [facility], [address] [place], [place] [address]   Lipid panel     Status: Abnormal   Collection Time: 06/27/22  6:11 PM  Result Value Ref Range   Cholesterol 122 0 - 200 mg/dL   Triglycerides 478 (H) <150 mg/dL   HDL 31 (L) >29 mg/dL   Total CHOL/HDL Ratio 3.9 RATIO   VLDL 32 0 - 40 mg/dL   LDL Cholesterol 59 0 - 99 mg/dL    Comment:        Total Cholesterol/HDL:CHD Risk Coronary Heart Disease Risk Table                     Men   Women  1/2 Average Risk   3.4   3.3  Average Risk       5.0   4.4  2 X Average Risk   9.6   7.1  3 X Average Risk  23.4   11.0        Use the calculated Patient Ratio above and the CHD Risk Table to determine the patient's CHD Risk.        ATP III CLASSIFICATION (LDL):  <100     mg/dL   Optimal  562-130  mg/dL   Near or Above                    Optimal  130-159  mg/dL   Borderline  865-784  mg/dL   High  >696     mg/dL   Very High Performed at [facility] [address], [place], [place] [address]     Blood Alcohol level:  Lab Results  Component Value Date   [facility] <10 06/23/2022   ETH <10 11/25/2020    Metabolic Disorder Labs: Lab Results  Component Value Date   HGBA1C 5.4 11/12/2021   MPG 96.8 11/28/2020   MPG 96.8 06/28/2019   Lab Results  Component Value Date   PROLACTIN 13.1 06/28/2019  PROLACTIN 69.3 (H) 09/17/2016   Lab Results  Component Value Date   CHOL 122 06/27/2022   TRIG 162 (H) 06/27/2022   HDL 31 (L) 06/27/2022   CHOLHDL 3.9 06/27/2022   VLDL 32 06/27/2022   LDLCALC 59 06/27/2022   LDLCALC 58 11/28/2020    Physical Findings: AIMS: Facial and Oral Movements Muscles of Facial Expression: None, normal Lips and Perioral Area: None, normal Jaw: None, normal Tongue: None, normal,Extremity Movements Upper (arms, wrists, hands, fingers): None, normal Lower (legs, knees, ankles, toes): None, normal, Trunk Movements Neck, shoulders, hips: None, normal, Overall Severity Severity of abnormal movements  (highest score from questions above): None, normal Incapacitation due to abnormal movements: None, normal Patient's awareness of abnormal movements (rate only patient's report): No Awareness, Dental Status Current problems with teeth and/or dentures?: No Does patient usually wear dentures?: No  CIWA:    COWS:     Musculoskeletal: Strength & Muscle Tone: within normal limits Gait & Station: normal Patient leans: N/A  Psychiatric Specialty Exam:  Presentation  General Appearance:  Disheveled  Eye Contact: Fair  Speech: Pressured  Speech Volume: Normal  Handedness: Right   Mood and Affect  Mood: Irritable  Affect: Congruent   Thought Process  Thought Processes: Disorganized  Descriptions of Associations:Tangential  Orientation:Partial  Thought Content:Illogical; Perseveration; Scattered; Tangential  History of Schizophrenia/Schizoaffective disorder:No  Duration of Psychotic Symptoms:Greater than six months  Hallucinations:Hallucinations: None  Ideas of Reference:Delusions; Paranoia  Suicidal Thoughts:Suicidal Thoughts: No  Homicidal Thoughts:Homicidal Thoughts: No   Sensorium  Memory: Immediate Good; Recent Good  Judgment: Poor  Insight: Poor   Executive Functions  Concentration: Poor  Attention Span: Poor  Recall: Poor  Fund of Knowledge: Poor  Language: Poor   Psychomotor Activity  Psychomotor Activity: Psychomotor Activity: Normal   Assets  Assets: Resilience; Physical Health   Sleep  Sleep: Sleep: Good Number of Hours of Sleep: 9.5    Physical Exam: Physical Exam Constitutional:      Appearance: Normal appearance.  HENT:     Head: Normocephalic.     Nose: Nose normal.  Eyes:     Pupils: Pupils are equal, round, and reactive to light.  Musculoskeletal:        General: Normal range of motion.     Cervical back: Normal range of motion.  Neurological:     Mental Status: She is alert.  Psychiatric:         Attention and Perception: She is inattentive.        Mood and Affect: Affect is labile and inappropriate.        Speech: Speech is rapid and pressured and tangential.        Thought Content: Thought content is paranoid and delusional. Thought content does not include homicidal or suicidal ideation. Thought content does not include homicidal or suicidal plan.        Cognition and Memory: Memory is impaired.        Judgment: Judgment is impulsive and inappropriate.    Review of Systems  Constitutional:  Negative for fever.  HENT:  Negative for hearing loss.   Eyes:  Negative for blurred vision.  Respiratory:  Negative for cough.   Cardiovascular:  Negative for chest pain.  Gastrointestinal:  Negative for heartburn.  Genitourinary:  Negative for dysuria.  Musculoskeletal:  Negative for myalgias.  Skin:  Negative for rash.  Neurological:  Negative for dizziness.  Psychiatric/Behavioral:  Positive for hallucinations. Negative for depression, memory loss, substance abuse and suicidal ideas. The  patient is nervous/anxious and has insomnia.    Blood pressure 138/66, pulse 93, temperature 98.6 F (37 C), temperature source Oral, resp. rate 16, height 5\' 5"  (1.651 m), weight 88 kg, SpO2 98 %. Body mass index is 32.28 kg/m.  Treatment Plan Summary: Daily contact with patient to assess and evaluate symptoms and progress in treatment, Medication management, and Plan     Safety and Monitoring: Voluntary admission to inpatient psychiatric unit for safety, stabilization and treatment Daily contact with patient to assess and evaluate symptoms and progress in treatment Patient's case to be discussed in multi-disciplinary team meeting Observation Level : q15 minute checks Vital signs: q12 hours Precautions: Safety   Long Term Goal(s): Improvement in symptoms so as ready for discharge   Short Term Goals: Ability to identify changes in lifestyle to reduce recurrence of condition will improve,  Ability to verbalize feelings will improve, Ability to disclose and discuss suicidal ideas, Ability to demonstrate self-control will improve, Ability to identify and develop effective coping behaviors will improve, Ability to maintain clinical measurements within normal limits will improve, and Compliance with prescribed medications will improve   Diagnoses Principal Problem:   Severe manic bipolar 1 disorder with psychotic behavior (HCC) Active Problems:   Hyperthyroidism   GAD (generalized anxiety disorder)   Insomnia   Labs:  Lipid panel: HDL 31, Triglycerides 162, UDS: benzos, THC, TSH: <0.010; free T4, A1c remain outstanding  Medications Continue: - Risperidone 2 mg PO solution q12hrs or Haldol 10 mg IM, Cogentin 1 mg IM forced medication in place due to patients continued refusal to comply with scheduled medications.  - Propranolol 10 mg every 12 hours for anxiety - Ativan 1 mg BID for GAD (home med)  -Agitation Protocol: Haldol 5mg  PO/IM, Benadryl 50 mg IM/PO, Ativan 2mg  IM/PO 3 times daily PRN for agitation   PRNs - Tylenol 650 mg every 6 hours PRN for mild pain - Maalox 30 mg every 4 hrs PRN for indigestion - Milk of Magnesia as needed every 6 hrs for constipation -Trazodone 50 mg nightly as needed for sleep - Hydroxyzine 25 mg 3 times daily as needed for anxiety  Discharge Planning: Social work and case management to assist with discharge planning and identification of hospital follow-up needs prior to discharge Estimated LOS: 5-7 days Discharge Concerns: Need to establish a safety plan; Medication compliance and effectiveness Discharge Goals: Return home with outpatient referrals for mental health follow-up including medication management/psychotherapy   I certify that inpatient services furnished can reasonably be expected to improve the patient's condition.    Loletta Parish, NP 06/27/2022, 8:13 PMPatient ID: Veronica Perez, female   DOB: 02-May-1975, 47 y.o.    MRN: [id number]

## 2022-06-27 NOTE — Progress Notes (Signed)
Patient denies SI, HI and AVH. Rates depression 5/10, hopelessness 5/10 and anxiety 3/10. Patient is preoccupied with neighbors who are out to harm her, "trying to think of a safer county to live in besides [place], [place]." Patient also wants to "let doctors know that their diagnosis is wrong" and that the medications she's taking are "poison." Patient is cooperative, and agrees to take her medications. Attending groups and participating in activities. Safety maintained on the unit.   06/27/22 1000  Psych Admission Type (Psych Patients Only)  Admission Status Involuntary  Psychosocial Assessment  Patient Complaints Restlessness;Suspiciousness;Worrying  Eye Contact Fair  Facial Expression Angry  Affect Angry;Anxious;Labile;Preoccupied  Speech Rapid;Pressured;Equities trader Activity Fidgety  Appearance/Hygiene Unremarkable  Behavior Characteristics Agitated;Anxious;Irritable;Fidgety  Mood Anxious;Labile;Irritable;Preoccupied  Thought Process  Coherency Disorganized;Tangential  Content Blaming others;Paranoia;Preoccupation  Delusions Paranoid;Persecutory  Perception WDL  Hallucination None reported or observed  Judgment Limited  Confusion Mild  Danger to Self  Current suicidal ideation? Denies  Danger to Others  Danger to Others None reported or observed

## 2022-06-28 DIAGNOSIS — E0590 Thyrotoxicosis, unspecified without thyrotoxic crisis or storm: Secondary | ICD-10-CM

## 2022-06-28 DIAGNOSIS — F312 Bipolar disorder, current episode manic severe with psychotic features: Secondary | ICD-10-CM | POA: Diagnosis not present

## 2022-06-28 LAB — HEMOGLOBIN A1C
Hgb A1c MFr Bld: 5.2 % (ref 4.8–5.6)
Mean Plasma Glucose: 103 mg/dL

## 2022-06-28 MED ORDER — RISPERIDONE 1 MG/ML PO SOLN
3.0000 mg | Freq: Two times a day (BID) | ORAL | Status: DC
  Administered 2022-06-28 – 2022-07-02 (×9): 3 mg via ORAL
  Filled 2022-06-28 (×12): qty 3

## 2022-06-28 MED ORDER — HALOPERIDOL LACTATE 5 MG/ML IJ SOLN
10.0000 mg | Freq: Two times a day (BID) | INTRAMUSCULAR | Status: DC
  Filled 2022-06-28 (×12): qty 2

## 2022-06-28 MED ORDER — METHIMAZOLE 5 MG PO TABS
5.0000 mg | ORAL_TABLET | Freq: Every day | ORAL | Status: DC
  Administered 2022-06-28 – 2022-07-04 (×7): 5 mg via ORAL
  Filled 2022-06-28 (×8): qty 1

## 2022-06-28 NOTE — Progress Notes (Signed)
Pt is A&OX4, calm, poor insight, denies suicidal ideations, denies homicidal ideations, denies auditory hallucinations and denies visual hallucinations. Pt verbally agrees to approach staff if these become apparent and before harming self or others. Pt denies experiencing nightmares. Mood and affect are congruent. Pt appetite is ok. Pt's memory appears to be grossly intact, and Pt hasn't displayed any injurious behaviors. Pt is reluctantly medication compliant. There's no evidence of suicidal intent. Psychomotor activity was WNL. No s/s of Parkinson, Dystonia, Akathisia and/or Tardive Dyskinesia noted.
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
## 2022-06-28 NOTE — Group Note (Signed)
Date:  06/28/2022 Time:  9:04 PM  Group Topic/Focus:  Wrap-Up Group:   The focus of this group is to help patients review their daily goal of treatment and discuss progress on daily workbooks.    Participation Level:  Active  Participation Quality:  Appropriate  Affect:  Appropriate  Cognitive:  Lacking  Insight: Limited  Engagement in Group:  Engaged  Modes of Intervention:  Education and Exploration  Additional Comments:  Patient attended and participated in group tonight. She reports this was not a good day dor her. She did enjoy going outside,when she came back inside everything went wrong.  Veronica Perez [facility] 06/28/2022, 9:04 PM

## 2022-06-28 NOTE — Consult Note (Signed)
[facility] Initial Consultation Note  Veronica Perez ZOX:[id number] DOB: 1975/12/16 DOA: 06/24/2022  PCP: Pcp, No   Requesting Physician: Dr. Sherron Flemings  Reason for Consultation: Elevated T4  HPI: Veronica Perez is a 46 y.o. female with medical history significant for hyperthyroidism admitted to [facility] with bipolar mania. She has a history of hyperthyroidism but 6 months ago declined to take methimazole and propranolol per PCP notes. She is currently on low dose propranolol here. At the time, T4 was normal. Now, [facility] checked TSH and T4. TSH is undetectable and T4 is 2.71.   Review of Systems: Please see HPI for pertinent positives and negatives. A complete 10 system review of systems are otherwise negative.  Past Medical History:  Diagnosis Date   Bipolar disorder (HCC)    Depression    Former smoker, stopped smoking many years ago    HSV (herpes simplex virus) infection    Hypothyroid    Low TSH level 05/30/2016   Panic attacks    Thyroid disease    Past Surgical History:  Procedure Laterality Date   ADENOIDECTOMY     OVARIAN CYST SURGERY     TONSILLECTOMY      Social History:  reports that she has been smoking cigarettes. She has been smoking an average of .25 packs per day. She has never used smokeless tobacco. She reports that she does not currently use alcohol. She reports that she does not use drugs.  Allergies  Allergen Reactions   Penicillins Hives, Itching and Nausea And Vomiting    Has patient had a PCN reaction causing immediate rash, facial/tongue/throat swelling, SOB or lightheadedness with hypotension: No Has patient had a PCN reaction causing severe rash involving mucus membranes or skin necrosis: No Has patient had a PCN reaction that required hospitalization: No Has patient had a PCN reaction occurring within the last 10 years: No If all of the above answers are "NO", then may proceed with Cephalosporin use.   Iodine    Lactose Intolerance (Gi)     Penicillins Hives and Nausea And Vomiting    Family History  Problem Relation Age of Onset   Diabetes Father    Heart disease Father    Cancer Maternal Grandmother        breast cancer   Bipolar disorder Mother    Thyroid disease Neg Hx      Prior to Admission medications   Medication Sig Start Date End Date Taking? Authorizing Provider  Multiple Vitamins-Minerals (MULTIVITAMIN ADULTS) TABS Take 1 tablet by mouth every morning.   Yes [provider]  VRAYLAR 4.5 MG CAPS Take 1 capsule by mouth at bedtime. 06/04/22  Yes [provider]  LORazepam (ATIVAN) 2 MG tablet Take 2 mg by mouth 2 (two) times daily. 06/03/22   [provider]    Physical Exam: BP 130/69 (BP Location: Left Arm)   Pulse (!) 101   Temp (!) 97.5 F (36.4 C) (Oral)   Resp 16   Ht 5\' 5"  (1.651 m)   Wt 88 kg   SpO2 99%   BMI 32.28 kg/m            Recent Labs and Imaging Reviewed:  Basic Metabolic Panel: Recent Labs  Lab 06/23/22 1859  NA 138  K 3.7  CL 106  CO2 21*  GLUCOSE 111*  BUN 11  CREATININE 0.66  CALCIUM 9.1   Liver Function Tests: Recent Labs  Lab 06/23/22 1859  AST 19  ALT 24  ALKPHOS 64  BILITOT 0.9  PROT 7.1  ALBUMIN 3.6   No results for input(s): "LIPASE", "AMYLASE" in the last 168 hours. No results for input(s): "AMMONIA" in the last 168 hours. CBC: Recent Labs  Lab 06/23/22 1859  WBC 8.2  HGB 11.9*  HCT 37.4  MCV 78.4*  PLT 296   Cardiac Enzymes: No results for input(s): "CKTOTAL", "CKMB", "CKMBINDEX", "TROPONINI" in the last 168 hours.  BNP (last 3 results) No results for input(s): "BNP" in the last 8760 hours.  ProBNP (last 3 results) No results for input(s): "PROBNP" in the last 8760 hours.  CBG: No results for input(s): "GLUCAP" in the last 168 hours.  Radiological Exams on Admission: No results found.  Summary and Recommendations: 35F with bipolar mania and hyperthyroidism. Recommend check T3, start low dose  methimazole, continue propranolol, and recheck TSH, T3 and T4 in 3 months with PCP. Orders placed and recommendations relayed to [facility] team.  Thank you for involving [place] in the care of your patient. [facility] will sign off, please call with questions.  Time spent: 25 minutes  Dayvin Aber Sharlette Dense MD [facility] Pager [phone]  If 7PM-7AM, please contact night-coverage [email or web address] Password [facility]  06/28/2022, 8:41 AM

## 2022-06-28 NOTE — Progress Notes (Signed)
   06/28/22 0600  15 Minute Checks  Location Dayroom  Visual Appearance Calm  Behavior Composed  Sleep (Behavioral Health Patients Only)  Calculate sleep? (Click Yes once per 24 hr at 0600 safety check) Yes  Documented sleep last 24 hours 10

## 2022-06-29 DIAGNOSIS — F312 Bipolar disorder, current episode manic severe with psychotic features: Secondary | ICD-10-CM | POA: Diagnosis not present

## 2022-06-29 NOTE — Progress Notes (Signed)
   06/29/22 0545  15 Minute Checks  Location Bedroom  Visual Appearance Calm  Behavior Sleeping  Sleep (Behavioral Health Patients Only)  Calculate sleep? (Click Yes once per 24 hr at 0600 safety check) Yes  Documented sleep last 24 hours 6.75

## 2022-06-29 NOTE — Progress Notes (Signed)
   06/29/22 0900  Psych Admission Type (Psych Patients Only)  Admission Status Involuntary  Psychosocial Assessment  Patient Complaints Anxiety;Worrying;Suspiciousness  Eye Contact Fair  Facial Expression Animated;Anxious  Affect Anxious  Engineer, materials;Intrusive  Motor Activity Fidgety  Appearance/Hygiene Unremarkable  Behavior Characteristics Anxious;Irritable  Mood Suspicious;Anxious  Thought Process  Coherency Disorganized;Tangential  Content Blaming others;Paranoia  Delusions Paranoid;Persecutory  Perception WDL  Hallucination None reported or observed  Judgment Poor  Confusion None  Danger to Self  Current suicidal ideation? Denies  Danger to Others  Danger to Others None reported or observed

## 2022-06-29 NOTE — Progress Notes (Signed)
   06/28/22 2107  Psych Admission Type (Psych Patients Only)  Admission Status Involuntary  Psychosocial Assessment  Patient Complaints Anger;Anxiety;Agitation;Worrying  Eye Contact Fair  Facial Expression Animated;Anxious  Affect Anxious  Engineer, materials;Intrusive  Motor Activity Fidgety  Appearance/Hygiene Unremarkable  Behavior Characteristics Agitated;Anxious;Intrusive;Resistant to care  Mood Suspicious;Anxious  Thought Process  Coherency Disorganized;Tangential  Content Blaming others;Paranoia  Delusions Paranoid;Persecutory  Perception WDL  Hallucination None reported or observed  Judgment Poor  Confusion None  Danger to Self  Current suicidal ideation? Denies  Danger to Others  Danger to Others None reported or observed    On assessment, patient is moderately anxious.  Patient denies SI/HI/AVH.  Patient complains that the doctor is not listening.  Patient reports that Risperdal and Propranolol are not her regular medications and they are burning up her kidneys.  Writer notified the provider.  Administered scheduled Ativan per MAR.  Patient refused other medications.  At 2225, writer advised patient of the force med and offered the PO medication or the injection alternative.  Patient became angry, using profanity, screaming about her rights.  Administered scheduled medications per [facility] per doctor orders.  Patient is safe on the unit with q 15 minute safety checks.

## 2022-06-29 NOTE — Progress Notes (Signed)
[facility] MD Progress Note  06/29/2022 4:07 PM [name]  MRN:  [id number] Principal Problem: Severe manic bipolar 1 disorder with psychotic behavior (HCC) Diagnosis: Principal Problem:   Severe manic bipolar 1 disorder with psychotic behavior (HCC) Active Problems:   Hyperthyroidism   GAD (generalized anxiety disorder)   Insomnia  Reason For Hospitalization: Veronica Perez is a 47 year old Caucasian female with past psychiatric history of bipolar 1 disorder who presented to the [facility] on 06/23/22  with disorganized and pressured speech, disoriented unable to state how or reason for presenting to ED. Per collateral information obtained by the ED provider from pt's family, patient had not been adequately caring for herself, experiencing insomnia, disorientation, and repeated calls to law enforcement reporting she was in danger. Per EDP notes, patient's family reported not knowing how the patient got to the ER. She was IVC'd in ED for concern of her being a danger to self and others related to her altered mental status. She was assessed and recommended for inpatient hospitalization; transferred to [facility] on 05/21 for further stabilization and treatment.   24 hr chart review: SBP has trended in the 140s since yesterday.  DBP WNL.  Patient has been compliant with p.o. meds, and has not required forced medication order being implemented for the past 24 hours.  No as needed medications administered overnight.  No behavioral episodes documented or noted in the past 24 hours.  Patient slept for 6.75 hours last night as per nursing documentation.  Patient assessment note:  During today's encounter, patient denies SI, denies HI, denies AVH.  Psychosis remains, but to a lesser extent as compared to time of hospitalization.  Speech has normalized, and is not pressured.  Mania is resolving, patient reports that she slept last night.  She reports a good appetite.  She denies being in any physical pain.  During  encounter, pt perseverates about the "boom cars", and the neighbor thumping on her wall. Instead of stating that the [place] cars belonged to people out to harm her, she states that they could hurt someone or themselves by going too fast on the roads.  Patient reports that she was able to sleep well last night, she denies being in any physical pain, reports a good appetite, denies medication related side effects.  We are continuing medications as listed below, Risperdal 3 mg twice daily, along with other medications as listed below, since her symptoms as currently stabilizing with current medication regimen.  Continues hospitalization, remains necessary since psychosis has not completely resolved. She is continuing to state that the "[employer]" were pulling her around prior to her coming into the hospital.  Total Time spent with patient: 45 minutes  Past Psychiatric History: See H & P  Past Medical History:  Past Medical History:  Diagnosis Date   Bipolar disorder (HCC)    Depression    Former smoker, stopped smoking many years ago    HSV (herpes simplex virus) infection    Hypothyroid    Low TSH level 05/30/2016   Panic attacks    Thyroid disease     Past Surgical History:  Procedure Laterality Date   ADENOIDECTOMY     OVARIAN CYST SURGERY     TONSILLECTOMY     Family History:  Family History  Problem Relation Age of Onset   Diabetes Father    Heart disease Father    Cancer Maternal Grandmother        breast cancer   Bipolar disorder Mother  Thyroid disease Neg Hx    Family Psychiatric  History: See H & P Social History:  Social History   Substance and Sexual Activity  Alcohol Use Not Currently   Comment: denies     Social History   Substance and Sexual Activity  Drug Use No    Social History   Socioeconomic History   Marital status: Single    Spouse name: Not on file   Number of children: 1   Years of education: Not on file   Highest education level: High  school graduate  Occupational History   Not on file  Tobacco Use   Smoking status: Every Day    Packs/day: .25    Types: Cigarettes   Smokeless tobacco: Never  Vaping Use   Vaping Use: Never used  Substance and Sexual Activity   Alcohol use: Not Currently    Comment: denies   Drug use: No   Sexual activity: Yes    Birth control/protection: None  Other Topics Concern   Not on file  Social History Narrative   Not on file   Social Determinants of Health   Financial Resource Strain: Not on file  Food Insecurity: Patient Declined (06/24/2022)   Hunger Vital Sign    Worried About Running Out of Food in the Last Year: Patient declined    Ran Out of Food in the Last Year: Patient declined  Transportation Needs: Patient Declined (06/24/2022)   PRAPARE - Administrator, Civil Service (Medical): Patient declined    Lack of Transportation (Non-Medical): Patient declined  Physical Activity: Not on file  Stress: Not on file  Social Connections: Not on file   Sleep: Poor  Appetite:  Fair  Current Medications: Current Facility-Administered Medications  Medication Dose Route Frequency Provider Last Rate Last Admin   acetaminophen (TYLENOL) tablet 650 mg  650 mg Oral Q6H PRN Eligha Bridegroom, NP   650 mg at 06/28/22 0617   alum & mag hydroxide-simeth (MAALOX/MYLANTA) 200-200-20 MG/5ML suspension 30 mL  30 mL Oral Q4H PRN Eligha Bridegroom, NP       benztropine (COGENTIN) tablet 1 mg  1 mg Oral BID PRN Starleen Blue, NP       Or   benztropine mesylate (COGENTIN) injection 1 mg  1 mg Intramuscular BID PRN Starleen Blue, NP       diphenhydrAMINE (BENADRYL) capsule 50 mg  50 mg Oral TID PRN Eligha Bridegroom, NP       Or   diphenhydrAMINE (BENADRYL) injection 50 mg  50 mg Intramuscular TID PRN Eligha Bridegroom, NP       haloperidol (HALDOL) tablet 5 mg  5 mg Oral TID PRN Eligha Bridegroom, NP       Or   haloperidol lactate (HALDOL) injection 5 mg  5 mg Intramuscular TID PRN  Eligha Bridegroom, NP       risperiDONE (RISPERDAL) 1 MG/ML oral solution 3 mg  3 mg Oral Q12H Pearce Littlefield, NP   3 mg at 06/29/22 1610   Or   haloperidol lactate (HALDOL) injection 10 mg  10 mg Intramuscular Q12H Reynoldo Mainer, Tyler Aas, NP       hydrOXYzine (ATARAX) tablet 25 mg  25 mg Oral TID PRN Massengill, Harrold Donath, MD       LORazepam (ATIVAN) tablet 1 mg  1 mg Oral TID PRN Eligha Bridegroom, NP       Or   LORazepam (ATIVAN) injection 1 mg  1 mg Intramuscular TID PRN Eligha Bridegroom, NP  LORazepam (ATIVAN) tablet 1 mg  1 mg Oral BID Massengill, Nathan, MD   1 mg at 06/29/22 0748   magnesium hydroxide (MILK OF MAGNESIA) suspension 30 mL  30 mL Oral Daily PRN Eligha Bridegroom, NP       methimazole (TAPAZOLE) tablet 5 mg  5 mg Oral Daily Kirby Crigler, Mir M, MD   5 mg at 06/29/22 0846   propranolol (INDERAL) tablet 10 mg  10 mg Oral Q12H Massengill, Harrold Donath, MD   10 mg at 06/29/22 0748   traZODone (DESYREL) tablet 50 mg  50 mg Oral QHS PRN Massengill, Harrold Donath, MD        Lab Results:  Results for orders placed or performed during the hospital encounter of 06/24/22 (from the past 48 hour(s))  TSH     Status: Abnormal   Collection Time: 06/27/22  6:11 PM  Result Value Ref Range   TSH <0.010 (L) 0.350 - 4.500 uIU/mL    Comment: Performed by a 3rd Generation assay with a functional sensitivity of <=0.01 uIU/mL. Performed at [facility], [address] [place], [place] [address]   Lipid panel     Status: Abnormal   Collection Time: 06/27/22  6:11 PM  Result Value Ref Range   Cholesterol 122 0 - 200 mg/dL   Triglycerides 604 (H) <150 mg/dL   HDL 31 (L) >54 mg/dL   Total CHOL/HDL Ratio 3.9 RATIO   VLDL 32 0 - 40 mg/dL   LDL Cholesterol 59 0 - 99 mg/dL    Comment:        Total Cholesterol/HDL:CHD Risk Coronary Heart Disease Risk Table                     Men   Women  1/2 Average Risk   3.4   3.3  Average Risk       5.0   4.4  2 X Average Risk   9.6   7.1  3 X Average  Risk  23.4   11.0        Use the calculated Patient Ratio above and the CHD Risk Table to determine the patient's CHD Risk.        ATP III CLASSIFICATION (LDL):  <100     mg/dL   Optimal  098-119  mg/dL   Near or Above                    Optimal  130-159  mg/dL   Borderline  147-829  mg/dL   High  >562     mg/dL   Very High Performed at [facility], [address] [place], [place] [address]   Hemoglobin A1c     Status: None   Collection Time: 06/27/22  6:11 PM  Result Value Ref Range   Hgb A1c MFr Bld 5.2 4.8 - 5.6 %    Comment: (NOTE)         Prediabetes: 5.7 - 6.4         Diabetes: >6.4         Glycemic control for adults with diabetes: <7.0    Mean Plasma Glucose 103 mg/dL    Comment: (NOTE) Performed At: [facility] [address] [place], [place] [id number] Jolene Schimke MD BM:[id number]   T4, free     Status: Abnormal   Collection Time: 06/27/22  6:11 PM  Result Value Ref Range   Free T4 2.71 (H) 0.61 - 1.12 ng/dL  Comment: (NOTE) Biotin ingestion may interfere with free T4 tests. If the results are inconsistent with the TSH level, previous test results, or the clinical presentation, then consider biotin interference. If needed, order repeat testing after stopping biotin. Performed at [facility], [address] [place], [place] [address]     Blood Alcohol level:  Lab Results  Component Value Date   [place] <10 06/23/2022   ETH <10 11/25/2020    Metabolic Disorder Labs: Lab Results  Component Value Date   HGBA1C 5.2 06/27/2022   MPG 103 06/27/2022   MPG 96.8 11/28/2020   Lab Results  Component Value Date   PROLACTIN 13.1 06/28/2019   PROLACTIN 69.3 (H) 09/17/2016   Lab Results  Component Value Date   CHOL 122 06/27/2022   TRIG 162 (H) 06/27/2022   HDL 31 (L) 06/27/2022   CHOLHDL 3.9 06/27/2022   VLDL 32 06/27/2022   LDLCALC 59 06/27/2022   LDLCALC 58 11/28/2020    Physical Findings: AIMS: Facial and  Oral Movements Muscles of Facial Expression: None, normal Lips and Perioral Area: None, normal Jaw: None, normal Tongue: None, normal,Extremity Movements Upper (arms, wrists, hands, fingers): None, normal Lower (legs, knees, ankles, toes): None, normal, Trunk Movements Neck, shoulders, hips: None, normal, Overall Severity Severity of abnormal movements (highest score from questions above): None, normal Incapacitation due to abnormal movements: None, normal Patient's awareness of abnormal movements (rate only patient's report): No Awareness, Dental Status Current problems with teeth and/or dentures?: No Does patient usually wear dentures?: No  CIWA:    COWS:     Musculoskeletal: Strength & Muscle Tone: within normal limits Gait & Station: normal Patient leans: N/A  Psychiatric Specialty Exam:  Presentation  General Appearance:  Appropriate for Environment; Fairly Groomed  Eye Contact: Fair  Speech: Clear and Coherent  Speech Volume: Normal  Handedness: Right   Mood and Affect  Mood: Anxious  Affect: Congruent   Thought Process  Thought Processes: Coherent  Descriptions of Associations:Intact  Orientation:Partial  Thought Content:Logical  History of Schizophrenia/Schizoaffective disorder:No  Duration of Psychotic Symptoms:Greater than six months  Hallucinations:Hallucinations: None  Ideas of Reference:Delusions; Paranoia; Percusatory  Suicidal Thoughts:Suicidal Thoughts: No  Homicidal Thoughts:Homicidal Thoughts: No   Sensorium  Memory: Immediate Good  Judgment: Fair  Insight: Fair   Chartered certified accountant: Fair  Attention Span: Fair  Recall: [employer] of Knowledge: Fair  Language: Fair   Psychomotor Activity  Psychomotor Activity: Psychomotor Activity: Normal   Assets  Assets: Manufacturing systems engineer; Resilience   Sleep  Sleep: Sleep: Poor    Physical Exam: Physical Exam Constitutional:       Appearance: Normal appearance.  HENT:     Head: Normocephalic.     Nose: Nose normal.  Eyes:     Pupils: Pupils are equal, round, and reactive to light.  Musculoskeletal:        General: Normal range of motion.     Cervical back: Normal range of motion.  Neurological:     Mental Status: She is alert.  Psychiatric:        Attention and Perception: She is inattentive.        Mood and Affect: Affect is labile and inappropriate.        Speech: Speech is rapid and pressured and tangential.        Thought Content: Thought content is paranoid and delusional. Thought content does not include homicidal or suicidal ideation. Thought content does not include homicidal or suicidal plan.  Cognition and Memory: Memory is impaired.        Judgment: Judgment is impulsive and inappropriate.    Review of Systems  Constitutional:  Negative for fever.  HENT:  Negative for hearing loss.   Eyes:  Negative for blurred vision.  Respiratory:  Negative for cough.   Cardiovascular:  Negative for chest pain.  Gastrointestinal:  Negative for heartburn.  Genitourinary:  Negative for dysuria.  Musculoskeletal:  Negative for myalgias.  Skin:  Negative for rash.  Neurological:  Negative for dizziness.  Psychiatric/Behavioral:  Positive for hallucinations. Negative for depression, memory loss, substance abuse and suicidal ideas. The patient is nervous/anxious and has insomnia.    Blood pressure (!) 116/54, pulse (!) 105, temperature 98.5 F (36.9 C), temperature source Oral, resp. rate 16, height 5\' 5"  (1.651 m), weight 88 kg, SpO2 99 %. Body mass index is 32.28 kg/m.  Treatment Plan Summary: Daily contact with patient to assess and evaluate symptoms and progress in treatment, Medication management, and Plan     Safety and Monitoring: Voluntary admission to inpatient psychiatric unit for safety, stabilization and treatment Daily contact with patient to assess and evaluate symptoms and progress in  treatment Patient's case to be discussed in multi-disciplinary team meeting Observation Level : q15 minute checks Vital signs: q12 hours Precautions: Safety   Long Term Goal(s): Improvement in symptoms so as ready for discharge   Short Term Goals: Ability to identify changes in lifestyle to reduce recurrence of condition will improve, Ability to verbalize feelings will improve, Ability to disclose and discuss suicidal ideas, Ability to demonstrate self-control will improve, Ability to identify and develop effective coping behaviors will improve, Ability to maintain clinical measurements within normal limits will improve, and Compliance with prescribed medications will improve   Diagnoses Principal Problem:   Severe manic bipolar 1 disorder with psychotic behavior (HCC) Active Problems:   Hyperthyroidism   GAD (generalized anxiety disorder)   Insomnia   Medications -Continue forced medication order due to patient's inability to comply voluntarily with p.o. meds.  Dr. Sherron Flemings & and Dr. Enedina Finner both assessed patient and agreed with implementation of forced medication order on 5/24 with the goal of betterment of patient's mental status.  Forced medication order is as follows:   -Increase Risperidone to 3 mg oral solution every 12 hours. IF PATIENT REFUSES this medication each time it is offered, give Haldol 10 mg IM + Cogentin 1 mg IM   Only forced medication is medication listed above.  Other medications are as follows:  -Continue methimazole 5 mg daily for hypothyroidism as per medicine team's recommendation -Continue propranolol 10 mg every 12 hours for anxiety -Continue trazodone 50 mg nightly as needed for sleep -Continue hydroxyzine 25 mg 3 times daily as needed for anxiety -Continue Ativan 1 mg BID for GAD (home med) -Continue agitation protocol medication: Haldol/Benadryl/Ativan 3 times daily as needed for agitation   PRNs - Tylenol 650 mg every 6 hours PRN for mild pain -  Maalox 30 mg every 4 hrs PRN for indigestion - Milk of Magnesia as needed every 6 hrs for constipation -Trazodone 50 mg nightly as needed for sleep - Hydroxyzine 25 mg 3 times daily as needed for anxiety  Discharge Planning: Social work and case management to assist with discharge planning and identification of hospital follow-up needs prior to discharge Estimated LOS: 5-7 days Discharge Concerns: Need to establish a safety plan; Medication compliance and effectiveness Discharge Goals: Return home with outpatient referrals for mental health follow-up including  medication management/psychotherapy   I certify that inpatient services furnished can reasonably be expected to improve the patient's condition.    Starleen Blue, NP 06/29/2022, 4:07 PMPatient ID: Veronica Perez, female   DOB: [birth year], 47 y.o.   MRN: [id number] Patient ID: REIGNA FARON, female   DOB: [birth year], 47 y.o.   MRN: [id number] Patient ID: RAYLEA HOLTZAPPLE, female   DOB: 1976-01-20, 47 y.o.   MRN: [id number]

## 2022-06-29 NOTE — B[facility] Group Notes (Signed)
Adult Psychoeducational Group Note  Date:  06/29/2022 Time:  9:17 PM  Group Topic/Focus:  Wrap-Up Group:   The focus of this group is to help patients review their daily goal of treatment and discuss progress on daily workbooks.  Participation Level:  Did Not Attend  Participation Quality:   Did not attend  Affect:   Did not attend  Cognitive:   Did not attend  Insight: None  Engagement in Group:   Did not attend  Modes of Intervention:   Did not attend  Additional Comments:  Did not attend  Christ Kick 06/29/2022, 9:17 PM

## 2022-06-29 NOTE — Plan of Care (Signed)
  Problem: Education: Goal: Knowledge of the prescribed therapeutic regimen will improve Outcome: Progressing   Problem: Coping: Goal: Will verbalize feelings Outcome: Progressing   

## 2022-06-30 ENCOUNTER — Encounter (HOSPITAL_COMMUNITY): Payer: Self-pay

## 2022-06-30 DIAGNOSIS — F312 Bipolar disorder, current episode manic severe with psychotic features: Secondary | ICD-10-CM | POA: Diagnosis not present

## 2022-06-30 LAB — T3, FREE: T3, Free: 7 pg/mL — ABNORMAL HIGH (ref 2.0–4.4)

## 2022-06-30 MED ORDER — HYDROCORTISONE 1 % EX CREA
TOPICAL_CREAM | Freq: Three times a day (TID) | CUTANEOUS | Status: DC
  Administered 2022-07-01 – 2022-07-10 (×5): 1 via TOPICAL
  Filled 2022-06-30: qty 28

## 2022-06-30 MED ORDER — PROPRANOLOL HCL 20 MG PO TABS
20.0000 mg | ORAL_TABLET | Freq: Two times a day (BID) | ORAL | Status: DC
  Administered 2022-06-30 – 2022-07-02 (×4): 20 mg via ORAL
  Filled 2022-06-30 (×8): qty 1

## 2022-06-30 NOTE — Progress Notes (Signed)
D- Patient alert. Pt labile, irritable and was experiencing visual hallucinations of spiders crawling in her room. Pt removed air conditioner filters and cleaned it. She stated it was dirty and attracting the spiders. After prompting and redirection she left the air conditioner unit alone. However, she then removed the curtains from the shower and stated it was dirty and appeared to have mold. The curtains in her room where dirty and EVS changed it for her. After taking her bedtime medications, she than began crying in the hallway because she was scared of the lighting from outside. Pt was hesitant to go in her room. She stated "If I go near the windows, the salt form my body will interact with the lighting through the window." Pt reassured she was safe but refused to enter room.  A- Scheduled medications administered to patient, per MD orders. Pt remained reluctant to take medication and complained about being "drugged by doctors who don't  know her" while taking it. She was upset that the Risperidone was increased to 3 mg.  Routine safety checks conducted every 15 minutes.  Patient informed to notify staff with problems or concerns.   R- At bedtime, patient anxiety reduced and she was able to enter room to go to sleep.

## 2022-06-30 NOTE — Group Note (Signed)
Recreation Therapy Group Note   Group Topic:Other  Group Date: 06/30/2022 Start Time: 1030 End Time: 1106 Facilitators: Wilferd Ritson-McCall, LRT,CTRS Location: [address] Day[address]   Goal Area(s) Addresses:  Patient will work together to answer trivia questions.   Patient will be respectful of others throughout activity.  Group Description: Music Trivia.  Patients were partnered up compete in activity.  LRT read trivia questions that covered different styles, types and genres of music.  The team with the highest score wins the game.    Affect/Mood: Appropriate   Participation Level: Engaged   Participation Quality: Independent   Behavior: Appropriate   Speech/Thought Process: Focused   Insight: Good   Judgement: Good   Modes of Intervention: Competitive Play   Patient Response to Interventions:  Engaged   Education Outcome:  Acknowledges education   Clinical Observations/Individualized Feedback: Pt came to group a little late.  Pt was engaged and worked well with peers.  Pt was appropriate throughout group session.     Plan: Continue to engage patient in RT group sessions 2-3x/week.   Rhylie Stehr-McCall, LRT,CTRS  06/30/2022 12:54 PM
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
## 2022-06-30 NOTE — [facility] Group Notes (Signed)
Adult Psychoeducational Group Note  Date:  06/30/2022 Time:  6:13 PM  Group Topic/Focus:  Goals Group:   The focus of this group is to help patients establish daily goals to achieve during treatment and discuss how the patient can incorporate goal setting into their daily lives to aide in recovery. Orientation:   The focus of this group is to educate the patient on the purpose and policies of crisis stabilization and provide a format to answer questions about their admission.  The group details unit policies and expectations of patients while admitted.  Participation Level:  Active  Participation Quality:  Appropriate  Affect:  Appropriate  Cognitive:  Appropriate  Insight: Appropriate  Engagement in Group:  Engaged  Modes of Intervention:  Discussion  Additional Comments:  Pt attended the goals group and remained appropriate and engaged throughout the duration of the group.   Sheran Lawless 06/30/2022, 6:13 PM

## 2022-06-30 NOTE — Progress Notes (Signed)
   06/30/22 0600  15 Minute Checks  Location Dayroom  Visual Appearance Calm  Behavior Composed  Sleep (Behavioral Health Patients Only)  Calculate sleep? (Click Yes once per 24 hr at 0600 safety check) Yes  Documented sleep last 24 hours 6.5

## 2022-06-30 NOTE — [facility] Group Notes (Signed)
Adult Psychoeducational Group Note  Date:  06/30/2022 Time:  9:09 PM  Group Topic/Focus:  Wrap-Up Group:   The focus of this group is to help patients review their daily goal of treatment and discuss progress on daily workbooks.  Participation Level:  Did Not Attend  Participation Quality:   Did Not Attend  Affect:   Did Not Attend  Cognitive:   Did Not Attend  Insight: None  Engagement in Group:   Did Not Attend  Modes of Intervention:   Did Not Attend  Additional Comments:  Pt was encouraged to attend wrap up group but did not attend.  Felipa Furnace 06/30/2022, 9:09 PM

## 2022-06-30 NOTE — Progress Notes (Signed)
   06/30/22 2015  Psych Admission Type (Psych Patients Only)  Admission Status Involuntary  Psychosocial Assessment  Patient Complaints Worrying;Suspiciousness  Eye Contact Fair  Facial Expression Anxious;Worried  Affect Anxious  Speech Pressured;Tangential  Interaction Assertive  Motor Activity Fidgety  Appearance/Hygiene Excess makeup  Behavior Characteristics Cooperative  Mood Suspicious  Aggressive Behavior  Effect No apparent injury  Thought Process  Coherency Circumstantial;Disorganized;Tangential  Content Blaming others  Delusions Paranoid;Persecutory  Perception WDL  Hallucination None reported or observed  Judgment Poor  Confusion None  Danger to Self  Current suicidal ideation? Denies
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
## 2022-06-30 NOTE — BH IP Treatment Plan (Signed)
Interdisciplinary Treatment and Diagnostic Plan Update  06/30/2022 Time of Session: 10:20 AM (UPDATE)  Veronica Perez MRN: [id number]  Principal Diagnosis: Severe manic bipolar 1 disorder with psychotic behavior (HCC)  Secondary Diagnoses: Principal Problem:   Severe manic bipolar 1 disorder with psychotic behavior (HCC) Active Problems:   Hyperthyroidism   GAD (generalized anxiety disorder)   Insomnia   Current Medications:  Current Facility-Administered Medications  Medication Dose Route Frequency Provider Last Rate Last Admin   acetaminophen (TYLENOL) tablet 650 mg  650 mg Oral Q6H PRN Eligha Bridegroom, NP   650 mg at 06/28/22 0617   alum & mag hydroxide-simeth (MAALOX/MYLANTA) 200-200-20 MG/5ML suspension 30 mL  30 mL Oral Q4H PRN Eligha Bridegroom, NP       benztropine (COGENTIN) tablet 1 mg  1 mg Oral BID PRN Starleen Blue, NP       Or   benztropine mesylate (COGENTIN) injection 1 mg  1 mg Intramuscular BID PRN Starleen Blue, NP       diphenhydrAMINE (BENADRYL) capsule 50 mg  50 mg Oral TID PRN Eligha Bridegroom, NP       Or   diphenhydrAMINE (BENADRYL) injection 50 mg  50 mg Intramuscular TID PRN Eligha Bridegroom, NP       haloperidol (HALDOL) tablet 5 mg  5 mg Oral TID PRN Eligha Bridegroom, NP       Or   haloperidol lactate (HALDOL) injection 5 mg  5 mg Intramuscular TID PRN Eligha Bridegroom, NP       risperiDONE (RISPERDAL) 1 MG/ML oral solution 3 mg  3 mg Oral Q12H Nkwenti, Doris, NP   3 mg at 06/30/22 4098   Or   haloperidol lactate (HALDOL) injection 10 mg  10 mg Intramuscular Q12H Starleen Blue, NP       hydrOXYzine (ATARAX) tablet 25 mg  25 mg Oral TID PRN Massengill, Harrold Donath, MD       LORazepam (ATIVAN) tablet 1 mg  1 mg Oral TID PRN Eligha Bridegroom, NP       Or   LORazepam (ATIVAN) injection 1 mg  1 mg Intramuscular TID PRN Eligha Bridegroom, NP       LORazepam (ATIVAN) tablet 1 mg  1 mg Oral BID Massengill, Nathan, MD   1 mg at 06/30/22 1191   magnesium  hydroxide (MILK OF MAGNESIA) suspension 30 mL  30 mL Oral Daily PRN Eligha Bridegroom, NP       methimazole (TAPAZOLE) tablet 5 mg  5 mg Oral Daily Kirby Crigler, Mir M, MD   5 mg at 06/30/22 4782   propranolol (INDERAL) tablet 10 mg  10 mg Oral Q12H Massengill, Harrold Donath, MD   10 mg at 06/30/22 9562   traZODone (DESYREL) tablet 50 mg  50 mg Oral QHS PRN Massengill, Harrold Donath, MD       PTA Medications: Medications Prior to Admission  Medication Sig Dispense Refill Last Dose   Multiple Vitamins-Minerals (MULTIVITAMIN ADULTS) TABS Take 1 tablet by mouth every morning.   Past Week   VRAYLAR 4.5 MG CAPS Take 1 capsule by mouth at bedtime.   Past Week   LORazepam (ATIVAN) 2 MG tablet Take 2 mg by mouth 2 (two) times daily.       Patient Stressors: Financial difficulties   Health problems   Legal issue    Patient Strengths: Manufacturing systems engineer  Supportive family/friends   Treatment Modalities: Medication Management, Group therapy, Case management,  1 to 1 session with clinician, Psychoeducation, Recreational therapy.   Physician Treatment Plan  for Primary Diagnosis: Severe manic bipolar 1 disorder with psychotic behavior (HCC) Long Term Goal(s): Improvement in symptoms so as ready for discharge   Short Term Goals: Ability to identify changes in lifestyle to reduce recurrence of condition will improve Ability to verbalize feelings will improve Ability to disclose and discuss suicidal ideas Ability to demonstrate self-control will improve Ability to identify and develop effective coping behaviors will improve Ability to maintain clinical measurements within normal limits will improve Compliance with prescribed medications will improve  Medication Management: Evaluate patient's response, side effects, and tolerance of medication regimen.  Therapeutic Interventions: 1 to 1 sessions, Unit Group sessions and Medication administration.  Evaluation of Outcomes: Progressing  Physician Treatment Plan  for Secondary Diagnosis: Principal Problem:   Severe manic bipolar 1 disorder with psychotic behavior (HCC) Active Problems:   Hyperthyroidism   GAD (generalized anxiety disorder)   Insomnia  Long Term Goal(s): Improvement in symptoms so as ready for discharge   Short Term Goals: Ability to identify changes in lifestyle to reduce recurrence of condition will improve Ability to verbalize feelings will improve Ability to disclose and discuss suicidal ideas Ability to demonstrate self-control will improve Ability to identify and develop effective coping behaviors will improve Ability to maintain clinical measurements within normal limits will improve Compliance with prescribed medications will improve     Medication Management: Evaluate patient's response, side effects, and tolerance of medication regimen.  Therapeutic Interventions: 1 to 1 sessions, Unit Group sessions and Medication administration.  Evaluation of Outcomes: Progressing   RN Treatment Plan for Primary Diagnosis: Severe manic bipolar 1 disorder with psychotic behavior (HCC) Long Term Goal(s): Knowledge of disease and therapeutic regimen to maintain health will improve  Short Term Goals: Ability to remain free from injury will improve, Ability to verbalize frustration and anger appropriately will improve, Ability to participate in decision making will improve, Ability to verbalize feelings will improve, Ability to identify and develop effective coping behaviors will improve, and Compliance with prescribed medications will improve  Medication Management: RN will administer medications as ordered by provider, will assess and evaluate patient's response and provide education to patient for prescribed medication. RN will report any adverse and/or side effects to prescribing provider.  Therapeutic Interventions: 1 on 1 counseling sessions, Psychoeducation, Medication administration, Evaluate responses to treatment, Monitor vital  signs and CBGs as ordered, Perform/monitor CIWA, COWS, AIMS and Fall Risk screenings as ordered, Perform wound care treatments as ordered.  Evaluation of Outcomes: Progressing   LCSW Treatment Plan for Primary Diagnosis: Severe manic bipolar 1 disorder with psychotic behavior (HCC) Long Term Goal(s): Safe transition to appropriate next level of care at discharge, Engage patient in therapeutic group addressing interpersonal concerns.  Short Term Goals: Engage patient in aftercare planning with referrals and resources, Increase social support, Increase emotional regulation, Facilitate acceptance of mental health diagnosis and concerns, Identify triggers associated with mental health/substance abuse issues, and Increase skills for wellness and recovery  Therapeutic Interventions: Assess for all discharge needs, 1 to 1 time with Social worker, Explore available resources and support systems, Assess for adequacy in community support network, Educate family and significant other(s) on suicide prevention, Complete Psychosocial Assessment, Interpersonal group therapy.  Evaluation of Outcomes: Progressing   Progress in Treatment: Attending groups: Yes. Participating in groups: Yes. Taking medication as prescribed: Yes. Toleration medication: Yes. Family/Significant other contact made: declined  Patient understands diagnosis: No. Discussing patient identified problems/goals with staff: Yes. Medical problems stabilized or resolved: Yes. Denies suicidal/homicidal ideation: Yes. Issues/concerns per  patient self-inventory: No.     New problem(s) identified: No, Describe:  None Reported    New Short Term/Long Term Goal(s): medication stabilization, elimination of SI thoughts, development of comprehensive mental wellness plan.     Patient Goals:  " go off to college and get better"    Discharge Plan or Barriers: Patient recently admitted. CSW will continue to follow and assess for appropriate  referrals and possible discharge planning.     Reason for Continuation of Hospitalization: Anxiety Delusions  Depression Medication stabilization   Estimated Length of Stay: 3-5 Days    Last 3 [place] Suicide Severity Risk Score: Flowsheet Row Admission (Current) [facility] from 06/23/2022 in [facility] ED from 10/30/2021 in [facility] Emergency Department at [facility]  C-SSRS RISK CATEGORY No Risk No Risk No Risk       Last PHQ 2/9 Scores:    11/12/2021    1:28 PM 12/26/2020   10:38 AM 05/27/2016    2:22 PM  Depression screen PHQ 2/9  Decreased Interest 0 1 3  Down, Depressed, Hopeless 0 1 3  PHQ - 2 Score 0 2 6  Altered sleeping  2 1  Tired, decreased energy  2 3  Change in appetite  0 0  Feeling bad or failure about yourself   0 0  Trouble concentrating  1 0  Moving slowly or fidgety/restless  0 0  Suicidal thoughts   0  PHQ-9 Score  7 10  Difficult doing work/chores  Somewhat difficult     Scribe for Treatment Team: Isabella Bowens, LCSWA 06/30/2022 1:21 PM

## 2022-07-01 DIAGNOSIS — F312 Bipolar disorder, current episode manic severe with psychotic features: Secondary | ICD-10-CM | POA: Diagnosis not present

## 2022-07-01 MED ORDER — CHLORPROMAZINE HCL 25 MG PO TABS
25.0000 mg | ORAL_TABLET | Freq: Two times a day (BID) | ORAL | Status: DC
  Administered 2022-07-01 – 2022-07-02 (×3): 25 mg via ORAL
  Filled 2022-07-01 (×8): qty 1

## 2022-07-01 NOTE — Progress Notes (Signed)
   07/01/22 1100  Psych Admission Type (Psych Patients Only)  Admission Status Involuntary  Psychosocial Assessment  Patient Complaints Anxiety  Eye Contact Fair  Facial Expression Anxious;Worried  Affect Anxious  Horticulturist, commercial  Appearance/Hygiene Excess makeup  Behavior Characteristics Cooperative  Mood Anxious  Thought Process  Coherency Circumstantial;Disorganized  Content Blaming others  Delusions Paranoid  Perception WDL  Hallucination None reported or observed  Judgment Poor  Confusion None  Danger to Self  Current suicidal ideation? Denies  Danger to Others  Danger to Others None reported or observed   Dar Note: Patient presents with anxious mood and affect.  Denies suicidal thoughts, auditory and visual hallucinations. Medications given as prescribed but refused Thorazine.  Routine safety checks maintained.  Attended group and participated.  Patient is safe on and off the unit.

## 2022-07-01 NOTE — [facility] Group Notes (Signed)
Adult Psychoeducational Group Note  Date:  07/01/2022 Time:  9:09 PM  Group Topic/Focus:  Wrap-Up Group:   The focus of this group is to help patients review their daily goal of treatment and discuss progress on daily workbooks.  Participation Level:  Did Not Attend  Participation Quality:   Did not attend  Affect:   Did not attend  Cognitive:   Did not attend  Insight: None  Engagement in Group:   Did not attend  Modes of Intervention:   Did not attend  Additional Comments:  Did not attend  Christ Kick 07/01/2022, 9:09 PM
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
## 2022-07-01 NOTE — Group Note (Signed)
Recreation Therapy Group Note   Group Topic:Self-Esteem  Group Date: 07/01/2022 Start Time: 1036 End Time: 1110 Facilitators: Ibtisam Benge-McCall, LRT,CTRS Location: [address] Dayroom   Goal Area(s) Addresses:  Patient will identify and write at least one positive trait about themself. Patient will successfully identify influential people in their life and why they admire them. Patient will acknowledge the benefit of healthy self-esteem. Patient will endorse understanding of ways to increase self-esteem.   Group Description: LRT began group session with open dialogue asking the patients to define self-esteem and verbally identify positive qualities and traits people may possess. Patients were then instructed to design a personalized license plate, with words and drawings, representing at least 3 positive things about themselves. Pts were encouraged to include favorites, things they are proud of, what they enjoy doing, and dreams for their future. If a patient had a life motto or a meaningful phase that expressed their life values, pt's were asked to incorporate that into their design as well. Patients were given the opportunity to share their completed work with the group.   Affect/Mood: Appropriate   Participation Level: Engaged   Participation Quality: Independent   Behavior: Appropriate   Speech/Thought Process: Focused   Insight: Good   Judgement: Good   Modes of Intervention: Art   Patient Response to Interventions:  Engaged   Education Outcome:  Acknowledges education   Clinical Observations/Individualized Feedback: Pt was engaged and appropriate during group session.  Pt identified loving music and being outside.  Pt also stated her husband calls her "Sugar Bear".  Pt also drew some examples of crocheting because pt stated that is something her daughter likes to do.      Plan: Continue to engage patient in RT group sessions 2-3x/week.   Rien Marland-McCall,  LRT,CTRS 07/01/2022 1:09 PM

## 2022-07-01 NOTE — Progress Notes (Signed)
   07/01/22 2015  Psych Admission Type (Psych Patients Only)  Admission Status Involuntary  Psychosocial Assessment  Patient Complaints Anxiety  Eye Contact Fair  Facial Expression Anxious;Worried  Affect Anxious  Speech Pressured;Tangential  Interaction Assertive  Motor Activity Fidgety  Appearance/Hygiene Excess makeup  Behavior Characteristics Cooperative  Mood Anxious  Aggressive Behavior  Effect No apparent injury  Thought Process  Coherency Circumstantial;Disorganized;Tangential  Content Blaming others  Delusions Paranoid;Persecutory  Perception WDL  Hallucination None reported or observed  Judgment Poor  Confusion None  Danger to Self  Current suicidal ideation? Denies

## 2022-07-01 NOTE — B[facility] Counselor (Signed)
[facility] LCSW Progress Note   07/01/2022    11:55 AM  Veronica Perez   [id number]   Type of Contact and Topic:  Housing Resources  CSW spoke with patient and patient expressed ongoing frustration with [name] cars and safety issues at her home.  Pt was hyperfocused on these frustrations and CSW unable to redirect. CSW provided a list of affordable housing in the [place] area and instructed patient to call for alternative options.  Patient was agreeable.      Signed:  Anson [place] MSW, LCSW, LCAS 07/01/2022 11:55 AM

## 2022-07-01 NOTE — [facility] Group Notes (Signed)
Adult Psychoeducational Group Note  Date:  07/01/2022 Time:  6:29 PM  Group Topic/Focus:  Goals Group:   The focus of this group is to help patients establish daily goals to achieve during treatment and discuss how the patient can incorporate goal setting into their daily lives to aide in recovery. Orientation:   The focus of this group is to educate the patient on the purpose and policies of crisis stabilization and provide a format to answer questions about their admission.  The group details unit policies and expectations of patients while admitted.  Participation Level:  Active  Participation Quality:  Appropriate  Affect:  Appropriate  Cognitive:  Appropriate  Insight: Appropriate  Engagement in Group:  Engaged  Modes of Intervention:  Discussion  Additional Comments:  Pt attended the goals group and remained appropriate and engaged throughout the duration of the group.   Sheran Lawless 07/01/2022, 6:29 PM

## 2022-07-02 DIAGNOSIS — F312 Bipolar disorder, current episode manic severe with psychotic features: Secondary | ICD-10-CM | POA: Diagnosis not present

## 2022-07-02 MED ORDER — PROPRANOLOL HCL 20 MG PO TABS
20.0000 mg | ORAL_TABLET | Freq: Two times a day (BID) | ORAL | Status: AC
  Administered 2022-07-02: 20 mg via ORAL
  Filled 2022-07-02: qty 1

## 2022-07-02 MED ORDER — PROPRANOLOL HCL ER 60 MG PO CP24
60.0000 mg | ORAL_CAPSULE | Freq: Every day | ORAL | Status: DC
  Administered 2022-07-03 – 2022-07-10 (×8): 60 mg via ORAL
  Filled 2022-07-02 (×10): qty 1

## 2022-07-02 NOTE — Plan of Care (Signed)
  Problem: Coping: Goal: Ability to demonstrate self-control will improve Outcome: Progressing   Problem: Physical Regulation: Goal: Ability to maintain clinical measurements within normal limits will improve Outcome: Progressing   Problem: Safety: Goal: Periods of time without injury will increase Outcome: Progressing   Problem: Education: Goal: Knowledge of the prescribed therapeutic regimen will improve Outcome: Progressing
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
## 2022-07-02 NOTE — Progress Notes (Signed)
   07/02/22 0935  Psych Admission Type (Psych Patients Only)  Admission Status Involuntary  Psychosocial Assessment  Patient Complaints Anxiety  Eye Contact Fair  Facial Expression Anxious;Worried  Affect Anxious  Speech Tangential;Pressured  Teacher, music  Appearance/Hygiene Excess makeup  Behavior Characteristics Cooperative  Mood Anxious  Thought Process  Coherency Circumstantial;Tangential  Content Blaming others  Delusions Paranoid;Persecutory  Perception WDL  Hallucination None reported or observed  Judgment Poor  Confusion None  Danger to Self  Current suicidal ideation? Denies  Danger to Others  Danger to Others None reported or observed

## 2022-07-02 NOTE — Progress Notes (Addendum)
   07/02/22 2050  Psych Admission Type (Psych Patients Only)  Admission Status Involuntary  Psychosocial Assessment  Patient Complaints None  Eye Contact Fair  Facial Expression Fixed smile  Affect Appropriate to circumstance  Speech Logical/coherent;Pressured  Interaction Assertive  Motor Activity Fidgety  Appearance/Hygiene Excess makeup  Behavior Characteristics Cooperative;Appropriate to situation  Mood Anxious  Thought Process  Coherency Circumstantial  Content WDL  Delusions None reported or observed  Perception WDL  Hallucination None reported or observed  Judgment Limited  Confusion None  Danger to Self  Current suicidal ideation? Denies  Self-Injurious Behavior No self-injurious ideation or behavior indicators observed or expressed   Agreement Not to Harm Self Yes  Description of Agreement verbal  Danger to Others  Danger to Others None reported or observed   Progress note   D: Pt seen in milieu. Pt denies SI, HI, AVH. Contracts for safety. Pt rates pain  0/10. Pt rates anxiety  0/10 and depression  0/10. Pt states she has been preoccupied with her grooming today. Pt has on a lot of lipstick. Speech less pressured and tangential this evening. States she has been taking her medications. Attended group this evening. Went outside earlier in the day. No other concerns noted at this time.  A: Pt provided support and encouragement. Pt given scheduled medication as prescribed. PRNs as appropriate. Q15 min checks for safety.   R: Pt safe on the unit. Will continue to monitor.

## 2022-07-02 NOTE — Group Note (Signed)
Recreation Therapy Group Note   Group Topic:Stress Management  Group Date: 07/02/2022 Start Time: 1030 End Time: 1055 Facilitators: London Tarnowski-McCall, LRT,CTRS Location: [address] Dayroom   Goal Area(s) Addresses:  Patient will identify positive stress management techniques. Patient will identify benefits of using stress management post d/c.  Group Description: Meditation.  LRT and patients discussed the importance of stress management.  Patients also discussed how mental, physical and the spiritual elements work together to bring balance to the body.  LRT then played a morning meditation that helped to get individuals focused on their day and get them in the mental space to face whatever may come.    Affect/Mood: Appropriate   Participation Level: Engaged   Participation Quality: Independent   Behavior: Appropriate   Speech/Thought Process: Focused   Insight: Good   Judgement: Good   Modes of Intervention: Meditation   Patient Response to Interventions:  Engaged   Education Outcome:  Acknowledges education   Clinical Observations/Individualized Feedback: Pt expressed stress management helps to relax, reduce heart rate and calm down.  Pt stated she does piliates as a way to help her relax her mind, however, pt stated her body is always moving and not listening to her brain.  After the meditation, pt expressed her body still wanted to move around but she felt more relaxed.     Plan: Continue to engage patient in RT group sessions 2-3x/week.   [name], LRT,CTRS 07/02/2022 11:22 AM

## 2022-07-02 NOTE — Progress Notes (Signed)
Adult Psychoeducational Group Note  Date:  07/02/2022 Time:  8:42 PM  Group Topic/Focus:  Wrap-Up Group:   The focus of this group is to help patients review their daily goal of treatment and discuss progress on daily workbooks.  Participation Level:  Active  Participation Quality:  Appropriate  Affect:  Appropriate  Cognitive:  Appropriate  Insight: Appropriate  Engagement in Group:  Engaged  Modes of Intervention:  Support  Additional Comments:  Pt attended group, and enjoyed group conversation about music with peers and staff. Pt stated that she had a good day.   Veronica Perez 07/02/2022, 8:42 PM

## 2022-07-03 DIAGNOSIS — F312 Bipolar disorder, current episode manic severe with psychotic features: Secondary | ICD-10-CM | POA: Diagnosis not present

## 2022-07-03 MED ORDER — CHLORPROMAZINE HCL 50 MG PO TABS
50.0000 mg | ORAL_TABLET | Freq: Three times a day (TID) | ORAL | Status: DC
  Administered 2022-07-03 – 2022-07-07 (×13): 50 mg via ORAL
  Filled 2022-07-03 (×11): qty 1
  Filled 2022-07-03: qty 2
  Filled 2022-07-03 (×8): qty 1

## 2022-07-03 MED ORDER — LORAZEPAM 0.5 MG PO TABS
0.5000 mg | ORAL_TABLET | Freq: Two times a day (BID) | ORAL | Status: DC
  Administered 2022-07-03 – 2022-07-10 (×14): 0.5 mg via ORAL
  Filled 2022-07-03 (×14): qty 1

## 2022-07-03 MED ORDER — RISPERIDONE 3 MG PO TBDP
3.0000 mg | ORAL_TABLET | Freq: Two times a day (BID) | ORAL | Status: DC
  Administered 2022-07-03 – 2022-07-10 (×15): 3 mg via ORAL
  Filled 2022-07-03 (×19): qty 1

## 2022-07-03 MED ORDER — FLUTICASONE PROPIONATE 50 MCG/ACT NA SUSP
1.0000 | Freq: Every day | NASAL | Status: DC
  Administered 2022-07-03 – 2022-07-10 (×6): 1 via NASAL
  Filled 2022-07-03 (×2): qty 16

## 2022-07-03 MED ORDER — CHLORPROMAZINE HCL 25 MG PO TABS: 50.0000 mg | ORAL_TABLET | Freq: Three times a day (TID) | ORAL | Status: DC | PRN

## 2022-07-03 MED ORDER — SALINE SPRAY 0.65 % NA SOLN: 1.0000 | NASAL | Status: DC | PRN

## 2022-07-03 MED ORDER — CHLORPROMAZINE HCL 25 MG/ML IJ SOLN: 50.0000 mg | Freq: Three times a day (TID) | INTRAMUSCULAR | Status: DC | PRN

## 2022-07-03 NOTE — Group Note (Signed)
Recreation Therapy Group Note   Group Topic:Goal Setting  Group Date: 07/03/2022 Start Time: 1035 End Time: 1100 Facilitators: Jalyn Rosero-McCall, LRT,CTRS Location: [address] Dayroom   Goal Area(s) Addresses:  Patient will identify at least 3 long-term goal for their life.  Patient will reflect on short-term steps and support they will need to reach their goal(s).  Group Description:  Goal Setting. Patients were given a worksheet in which they were to identify their main priorities.  Patients were to then set goals to reach each priority they identified.  In addition, patients had to identified the action steps to reaching these goals.  Patients then shared 3 of their goals with the group.   Affect/Mood: Appropriate   Participation Level: Engaged   Participation Quality: Independent   Behavior: Appropriate   Speech/Thought Process: Focused   Insight: Good   Judgement: Good   Modes of Intervention: Worksheet   Patient Response to Interventions:  Engaged   Education Outcome:  Acknowledges education   Clinical Observations/Individualized Feedback: Pt was working on identifying her goals and action steps before she was called out of group.  Pt did not return to group before it was dismissed.     Plan: Continue to engage patient in RT group sessions 2-3x/week.   Ladon Heney-McCall, LRT,CTRS 07/03/2022 12:47 PM

## 2022-07-03 NOTE — Progress Notes (Signed)
[facility] MD Progress Note  07/03/2022 6:08 PM Veronica Perez  MRN:  [id number] Principal Problem: Severe manic bipolar 1 disorder with psychotic behavior (HCC) Diagnosis: Principal Problem:   Severe manic bipolar 1 disorder with psychotic behavior (HCC) Active Problems:   Hyperthyroidism   GAD (generalized anxiety disorder)   Insomnia  Reason For Hospitalization: Veronica Perez is a 47 year old Caucasian female with past psychiatric history of bipolar 1 disorder who presented to the [facility] on 06/23/22  with disorganized and pressured speech, disoriented unable to state how or reason for presenting to ED. Per collateral information obtained by the ED provider from pt's family, patient had not been adequately caring for herself, experiencing insomnia, disorientation, and repeated calls to law enforcement reporting she was in danger. Per EDP notes, patient's family reported not knowing how the patient got to the ER. She was IVC'd in ED for concern of her being a danger to self and others related to her altered mental status. She was assessed and recommended for inpatient hospitalization; transferred to [facility] on 05/21 for further stabilization and treatment.   Patient assessment note:  Today the patient exhibits tangential and rambling thought process.  Her speech is somewhat rapid.  She spends most of the conversation talking about "boom cars".  She feels that one of these cars "threw me out of my home".  She reports that her mood is "I fidget".  She denies experiencing any auditory or visual hallucinations.  She denies experiencing suicidal thoughts or homicidal thoughts.    Total Time spent with patient: 20 min  Past Psychiatric History: See H & P  Past Medical History:  Past Medical History:  Diagnosis Date   Bipolar disorder (HCC)    Depression    Former smoker, stopped smoking many years ago    HSV (herpes simplex virus) infection    Hypothyroid    Low TSH level 05/30/2016   Panic attacks     Thyroid disease     Past Surgical History:  Procedure Laterality Date   ADENOIDECTOMY     OVARIAN CYST SURGERY     TONSILLECTOMY     Family History:  Family History  Problem Relation Age of Onset   Diabetes Father    Heart disease Father    Cancer Maternal Grandmother        breast cancer   Bipolar disorder Mother    Thyroid disease Neg Hx    Family Psychiatric  History: See H & P Social History:  Social History   Substance and Sexual Activity  Alcohol Use Not Currently   Comment: denies     Social History   Substance and Sexual Activity  Drug Use No    Social History   Socioeconomic History   Marital status: Single    Spouse name: Not on file   Number of children: 1   Years of education: Not on file   Highest education level: High school graduate  Occupational History   Not on file  Tobacco Use   Smoking status: Every Day    Packs/day: .25    Types: Cigarettes   Smokeless tobacco: Never  Vaping Use   Vaping Use: Never used  Substance and Sexual Activity   Alcohol use: Not Currently    Comment: denies   Drug use: No   Sexual activity: Yes    Birth control/protection: None  Other Topics Concern   Not on file  Social History Narrative   Not on file   Social Determinants  of Health   Financial Resource Strain: Not on file  Food Insecurity: Patient Declined (06/24/2022)   Hunger Vital Sign    Worried About Running Out of Food in the Last Year: Patient declined    Ran Out of Food in the Last Year: Patient declined  Transportation Needs: Patient Declined (06/24/2022)   PRAPARE - Administrator, Civil Service (Medical): Patient declined    Lack of Transportation (Non-Medical): Patient declined  Physical Activity: Not on file  Stress: Not on file  Social Connections: Not on file   Sleep: Poor  Appetite:  Fair  Current Medications: Current Facility-Administered Medications  Medication Dose Route Frequency Provider Last Rate Last Admin    acetaminophen (TYLENOL) tablet 650 mg  650 mg Oral Q6H PRN Eligha Bridegroom, NP   650 mg at 06/28/22 0617   alum & mag hydroxide-simeth (MAALOX/MYLANTA) 200-200-20 MG/5ML suspension 30 mL  30 mL Oral Q4H PRN Eligha Bridegroom, NP       benztropine (COGENTIN) tablet 1 mg  1 mg Oral BID PRN Starleen Blue, NP       Or   benztropine mesylate (COGENTIN) injection 1 mg  1 mg Intramuscular BID PRN Starleen Blue, NP       chlorproMAZINE (THORAZINE) tablet 50 mg  50 mg Oral TID PRN Phineas Inches, MD       Or   chlorproMAZINE (THORAZINE) injection 50 mg  50 mg Intramuscular TID PRN Massengill, Harrold Donath, MD       chlorproMAZINE (THORAZINE) tablet 50 mg  50 mg Oral Q8H Massengill, Harrold Donath, MD   50 mg at 07/03/22 1341   diphenhydrAMINE (BENADRYL) capsule 50 mg  50 mg Oral TID PRN Eligha Bridegroom, NP       Or   diphenhydrAMINE (BENADRYL) injection 50 mg  50 mg Intramuscular TID PRN Eligha Bridegroom, NP       hydrocortisone cream 1 %   Topical TID Phineas Inches, MD   1 Application at 07/03/22 0841   hydrOXYzine (ATARAX) tablet 25 mg  25 mg Oral TID PRN Phineas Inches, MD       LORazepam (ATIVAN) tablet 1 mg  1 mg Oral TID PRN Eligha Bridegroom, NP       Or   LORazepam (ATIVAN) injection 1 mg  1 mg Intramuscular TID PRN Eligha Bridegroom, NP       LORazepam (ATIVAN) tablet 0.5 mg  0.5 mg Oral BID Massengill, Nathan, MD       magnesium hydroxide (MILK OF MAGNESIA) suspension 30 mL  30 mL Oral Daily PRN Eligha Bridegroom, NP       methimazole (TAPAZOLE) tablet 5 mg  5 mg Oral Daily Kirby Crigler, Mir M, MD   5 mg at 07/03/22 5176   propranolol ER (INDERAL LA) 24 hr capsule 60 mg  60 mg Oral Daily Massengill, Harrold Donath, MD   60 mg at 07/03/22 1607   risperiDONE (RISPERDAL M-TABS) disintegrating tablet 3 mg  3 mg Oral Q12H Massengill, Nathan, MD   3 mg at 07/03/22 3710   traZODone (DESYREL) tablet 50 mg  50 mg Oral QHS PRN Massengill, Harrold Donath, MD        Lab Results:  No results found for this or any  previous visit (from the past 48 hour(s)).   Blood Alcohol level:  Lab Results  Component Value Date   [facility] <10 06/23/2022   ETH <10 11/25/2020    Metabolic Disorder Labs: Lab Results  Component Value Date   HGBA1C 5.2 06/27/2022  MPG 103 06/27/2022   MPG 96.8 11/28/2020   Lab Results  Component Value Date   PROLACTIN 13.1 06/28/2019   PROLACTIN 69.3 (H) 09/17/2016   Lab Results  Component Value Date   CHOL 122 06/27/2022   TRIG 162 (H) 06/27/2022   HDL 31 (L) 06/27/2022   CHOLHDL 3.9 06/27/2022   VLDL 32 06/27/2022   LDLCALC 59 06/27/2022   LDLCALC 58 11/28/2020    Physical Findings: AIMS: Facial and Oral Movements Muscles of Facial Expression: None, normal Lips and Perioral Area: None, normal Jaw: None, normal Tongue: None, normal,Extremity Movements Upper (arms, wrists, hands, fingers): None, normal Lower (legs, knees, ankles, toes): None, normal, Trunk Movements Neck, shoulders, hips: None, normal, Overall Severity Severity of abnormal movements (highest score from questions above): None, normal Incapacitation due to abnormal movements: None, normal Patient's awareness of abnormal movements (rate only patient's report): No Awareness, Dental Status Current problems with teeth and/or dentures?: No Does patient usually wear dentures?: No  CIWA:    COWS:      Psychiatric Specialty Exam: Physical Exam Constitutional:      Appearance: the patient is not toxic-appearing.  Pulmonary:     Effort: Pulmonary effort is normal.  Neurological:     General: No focal deficit present.     Mental Status: the patient is alert and oriented to person, place, and time.   Review of Systems  Respiratory:  Negative for shortness of breath.   Cardiovascular:  Negative for chest pain.  Gastrointestinal:  Negative for abdominal pain, constipation, diarrhea, nausea and vomiting.  Neurological:  Negative for headaches.      BP 121/70 (BP Location: Left Arm)   Pulse (!) 102    Temp 98.5 F (36.9 C) (Oral)   Resp 20   Ht 5\' 5"  (1.651 m)   Wt 88 kg   SpO2 99%   BMI 32.28 kg/m   General Appearance: Fairly Groomed  Eye Contact:  Good  Speech:  Clear and Coherent  Volume:  Normal  Mood: "I fidget"  Affect: Odd, constricted  Thought Process: Tangential  Orientation:  Full (Time, Place, and Person)  Thought Content: Logical  Suicidal Thoughts:  No  Homicidal Thoughts:  No  Memory:  Immediate;   Good  Judgement:  poor  Insight:  poor  Psychomotor Activity:  Normal  Concentration:  Concentration: Good  Recall:  Good  Fund of Knowledge: Good  Language: Good  Akathisia:  No  Handed:  not assessed  AIMS (if indicated): not done  Assets:  Communication Skills Desire for Improvement Financial Resources/Insurance Housing Leisure Time Physical Health  ADL's:  Intact  Cognition: WNL  Sleep:  Fair    Blood pressure 121/70, pulse (!) 102, temperature 98.5 F (36.9 C), temperature source Oral, resp. rate 20, height 5\' 5"  (1.651 m), weight 88 kg, SpO2 99 %. Body mass index is 32.28 kg/m.  Treatment Plan Summary: Daily contact with patient to assess and evaluate symptoms and progress in treatment, Medication management, and Plan     Safety and Monitoring: Voluntary admission to inpatient psychiatric unit for safety, stabilization and treatment Daily contact with patient to assess and evaluate symptoms and progress in treatment Patient's case to be discussed in multi-disciplinary team meeting Observation Level : q15 minute checks Vital signs: q12 hours Precautions: Safety   Long Term Goal(s): Improvement in symptoms so as ready for discharge   Short Term Goals: Ability to identify changes in lifestyle to reduce recurrence of condition will improve, Ability to  verbalize feelings will improve, Ability to disclose and discuss suicidal ideas, Ability to demonstrate self-control will improve, Ability to identify and develop effective coping behaviors will  improve, Ability to maintain clinical measurements within normal limits will improve, and Compliance with prescribed medications will improve   Diagnoses Principal Problem:   Severe manic bipolar 1 disorder with psychotic behavior (HCC) Active Problems:   Hyperthyroidism   GAD (generalized anxiety disorder)   Insomnia   Medications -Continue forced medication order due to patient's inability to comply voluntarily with p.o. meds.  Dr. Sherron Flemings & and Dr. Enedina Finner both assessed patient and agreed with implementation of forced medication order on 5/24 x 7 days with the goal of betterment of patient's mental status.  -Risperidone changed from liquid formulation to M tabs, 3 mg twice daily, medication for psychosis and mania -- Increase Thorazine to 50 mg 3 times daily for psychosis and mania - EKG 5/30 NSR, QTc less than 460 Agitation protocol changed from Haldol to Thorazine -Ativan decreased from 1 mg twice daily to 0.5 mg twice daily (home medication)  -Continue methimazole 5 mg daily for hypothyroidism as per medicine team's recommendation -Propranolol increased to 60 mg, 24-hour capsule - Will monitor heart rate and blood pressure -Continue hydrocortisone cream as needed to right outer ankle for itching and inflammation -Continue trazodone 50 mg nightly as needed for sleep -Continue hydroxyzine 25 mg 3 times daily as needed for anxiety -Continue Ativan 1 mg BID for GAD (home med)   PRNs - Tylenol 650 mg every 6 hours PRN for mild pain - Maalox 30 mg every 4 hrs PRN for indigestion - Milk of Magnesia as needed every 6 hrs for constipation -Trazodone 50 mg nightly as needed for sleep - Hydroxyzine 25 mg 3 times daily as needed for anxiety  Discharge Planning: Social work and case management to assist with discharge planning and identification of hospital follow-up needs prior to discharge Estimated LOS: 5-7 days Discharge Concerns: Need to establish a safety plan; Medication  compliance and effectiveness Discharge Goals: Return home with outpatient referrals for mental health follow-up including medication management/psychotherapy   I certify that inpatient services furnished can reasonably be expected to improve the patient's condition.    Carlyn Reichert, MD 07/03/2022, 6:08 PM

## 2022-07-03 NOTE — Progress Notes (Signed)
   07/03/22 0558  15 Minute Checks  Location Bedroom  Visual Appearance Calm  Behavior Sleeping  Sleep (Behavioral Health Patients Only)  Calculate sleep? (Click Yes once per 24 hr at 0600 safety check) Yes  Documented sleep last 24 hours 7.75

## 2022-07-03 NOTE — Progress Notes (Addendum)
   07/03/22 2025  Psych Admission Type (Psych Patients Only)  Admission Status Involuntary  Psychosocial Assessment  Patient Complaints None  Eye Contact Fair  Facial Expression Animated  Affect Preoccupied  Speech Tangential  Interaction Assertive  Motor Activity Fidgety  Appearance/Hygiene Excess makeup  Behavior Characteristics Cooperative  Mood Pleasant  Thought Process  Coherency Circumstantial  Content WDL  Delusions Paranoid  Perception WDL  Hallucination None reported or observed  Judgment Limited  Confusion None  Danger to Self  Current suicidal ideation? Denies  Agreement Not to Harm Self Yes  Description of Agreement verbal  Danger to Others  Danger to Others None reported or observed   Progress note   D: Pt seen in milieu. Pt denies SI, HI, AVH. Pt rates pain  0/10. Pt rates anxiety  0/10 and depression  0/10. Denies any issues today. Pt informed that she will be watched in dayroom after medication administration to monitor for spitting meds out. Pt responded, "I have been taking my medications." Agreeable to plan. Reports good appetite, sleep and group attendance. No other concerns noted at this time.  A: Pt provided support and encouragement. Pt given scheduled medication as prescribed. PRNs as appropriate. Q15 min checks for safety.   R: Pt safe on the unit. Will continue to monitor.

## 2022-07-03 NOTE — Group Note (Signed)
Occupational Therapy Group Note  Group Topic: Sleep Hygiene  Group Date: 07/03/2022 Start Time: 1400 End Time: 1438 Facilitators: Ted Mcalpine, OT   Group Description: Group encouraged increased participation and engagement through topic focused on sleep hygiene. Patients reflected on the quality of sleep they typically receive and identified areas that need improvement. Group was given background information on sleep and sleep hygiene, including common sleep disorders. Group members also received information on how to improve one's sleep and introduced a sleep diary as a tool that can be utilized to track sleep quality over a length of time. Group session ended with patients identifying one or more strategies they could utilize or implement into their sleep routine in order to improve overall sleep quality.        Therapeutic Goal(s):  Identify one or more strategies to improve overall sleep hygiene  Identify one or more areas of sleep that are negatively impacted (sleep too much, too little, etc)     Participation Level: Engaged   Participation Quality: Independent   Behavior: Appropriate   Speech/Thought Process: Loose association    Affect/Mood: Appropriate   Insight: Fair   Judgement: Fair      Modes of Intervention: Education  Patient Response to Interventions:  Attentive   Plan: Continue to engage patient in OT groups 2 - 3x/week.  07/03/2022  Ted Mcalpine, OT   Kerrin Champagne, OT

## 2022-07-03 NOTE — BHH Group Notes (Signed)
Adult Psychoeducational Group Note  Date:  07/03/2022 Time:  8:39 PM  Group Topic/Focus:  Wrap-Up Group:   The focus of this group is to help patients review their daily goal of treatment and discuss progress on daily workbooks.  Participation Level:  Active  Participation Quality:  Attentive  Affect:  Appropriate  Cognitive:  Alert  Insight: Appropriate  Engagement in Group:  Engaged  Modes of Intervention:  Discussion  Additional Comments:  Patient attended and participated in the Wrap-up group.  Jearl Klinefelter 07/03/2022, 8:39 PM

## 2022-07-03 NOTE — Progress Notes (Signed)
Patient presented with irritability and pressured speech about neighbors who "damage the wall from banging," "people harassing you" and "boom cars in [place]." Patient presents with paranoia about "being stalked and someone is trying to get rid of my family, making [place] all have a massive heart attack." Patient is able to be redirected and agrees to take medications, though reports they make her "sleep all day." Patient was locked out of room and closely monitored after medications were administered today. EKG obtained and placed in patient chart. Patient remains safe on Q 15 min checks.   07/03/22 1000  Psych Admission Type (Psych Patients Only)  Admission Status Involuntary  Psychosocial Assessment  Patient Complaints Suspiciousness;Anxiety  Eye Contact Fair  Facial Expression Anxious;Animated  Affect Preoccupied  Speech Tangential;Pressured  Teacher, music  Appearance/Hygiene Excess makeup  Behavior Characteristics Cooperative;Hyperactive  Mood Anxious;Preoccupied  Thought Process  Coherency Circumstantial;Tangential  Content WDL  Delusions Persecutory;Paranoid  Perception WDL  Hallucination None reported or observed  Judgment Limited  Confusion None  Danger to Self  Current suicidal ideation? Denies  Self-Injurious Behavior No self-injurious ideation or behavior indicators observed or expressed   Agreement Not to Harm Self Yes  Description of Agreement verbal  Danger to Others  Danger to Others None reported or observed

## 2022-07-04 ENCOUNTER — Encounter (HOSPITAL_COMMUNITY): Payer: Self-pay

## 2022-07-04 LAB — T4, FREE: Free T4: 1.1 ng/dL (ref 0.61–1.12)

## 2022-07-04 LAB — TSH: TSH: <0.01 u[IU]/mL — ABNORMAL LOW (ref 0.350–4.500)

## 2022-07-04 MED ORDER — DIVALPROEX SODIUM ER 500 MG PO TB24
1000.0000 mg | ORAL_TABLET | Freq: Every day | ORAL | Status: DC
  Administered 2022-07-04 – 2022-07-07 (×4): 1000 mg via ORAL
  Filled 2022-07-04 (×6): qty 2

## 2022-07-04 MED ORDER — METHIMAZOLE 5 MG PO TABS
10.0000 mg | ORAL_TABLET | Freq: Every day | ORAL | Status: DC
  Administered 2022-07-05 – 2022-07-10 (×6): 10 mg via ORAL
  Filled 2022-07-04 (×8): qty 1

## 2022-07-04 MED ORDER — POLYETHYLENE GLYCOL 3350 17 G PO PACK
17.0000 g | PACK | Freq: Every day | ORAL | Status: DC
  Administered 2022-07-04 – 2022-07-08 (×4): 17 g via ORAL
  Filled 2022-07-04 (×9): qty 1

## 2022-07-04 NOTE — B[facility] Group Notes (Signed)
Spirituality group facilitated by Kathleen Argue, BCC.  Group Description: Group focused on topic of hope. Patients participated in facilitated discussion around topic, connecting with one another around experiences and definitions for hope. Group members engaged with visual explorer photos, reflecting on what hope looks like for them today. Group engaged in discussion around how their definitions of hope are present today in hospital.  Modalities: Psycho-social ed, Adlerian, Narrative, MI  Patient Progress: Veronica Perez attended group and actively engaged and participated in group conversation and activities.  She shared that peace and solitude give her hope and she engaged in the progressive muscle relaxation exercise that we did.

## 2022-07-04 NOTE — Progress Notes (Signed)
   07/04/22 0600  15 Minute Checks  Location Bedroom  Visual Appearance Calm  Behavior Sleeping  Sleep (Behavioral Health Patients Only)  Calculate sleep? (Click Yes once per 24 hr at 0600 safety check) Yes  Documented sleep last 24 hours 9

## 2022-07-04 NOTE — Group Note (Signed)
Recreation Therapy Group Note   Group Topic:Coping Skills  Group Date: 07/04/2022 Start Time: 1000 End Time: 1031 Facilitators: Deanne Bedgood-McCall, LRT,CTRS Location: [address] Dayroom   Goal Area(s) Addresses: Patient will define what a coping skill is. Patient will successfully identify positive coping skills they can use post d/c.  Patient will acknowledge benefit(s) of using learned coping skills post d/c.   Group Description: Coping A to Z. Patient asked to identify what a coping skill is and when they use them. Patients with Clinical research associate discussed healthy versus unhealthy coping skills. Next patients were given a blank worksheet titled "Coping Skills A-Z".  Patients were instructed to come up with at least one positive coping skill per letter of the alphabet. Patients were given 15 minutes to brainstorm before ideas were presented to the large group. Patients and LRT debriefed on the importance of coping skill selection based on situation and back-up plans when a skill tried is not effective. At the end of group, patients were given an handout of alphabetized strategies to keep for future reference.   Affect/Mood: N/A   Participation Level: Did not attend    Clinical Observations/Individualized Feedback:     Plan: Continue to engage patient in RT group sessions 2-3x/week.   Athenia Rys-McCall, LRT,CTRS 07/04/2022 12:20 PM

## 2022-07-04 NOTE — Progress Notes (Signed)
Patient denies SI, HI and AVH this shift. Patient has had no incidents of behavioral dyscontrol. Patient has attended groups and been compliant with medications.   Assess patient for safety, offer medications as prescribed, engage patient in 1:1 staff talks.  Patient able to contract for safety. Continue to monitor as planned.

## 2022-07-04 NOTE — Plan of Care (Signed)
  Problem: Health Behavior/Discharge Planning: Goal: Compliance with treatment plan for underlying cause of condition will improve Outcome: Progressing Patient is progressing toward treatment plan goal.

## 2022-07-04 NOTE — Progress Notes (Signed)
[facility] MD Progress Note  07/04/2022 4:16 PM Veronica Perez  MRN:  [id number] Principal Problem: Severe manic bipolar 1 disorder with psychotic behavior (HCC) Diagnosis: Principal Problem:   Severe manic bipolar 1 disorder with psychotic behavior (HCC) Active Problems:   Hyperthyroidism   GAD (generalized anxiety disorder)   Insomnia  Reason For Hospitalization: Veronica Perez is a 47 year old Caucasian female with past psychiatric history of bipolar 1 disorder who presented to the [facility] on 06/23/22  with disorganized and pressured speech, disoriented unable to state how or reason for presenting to ED. Per collateral information obtained by the ED provider from pt's family, patient had not been adequately caring for herself, experiencing insomnia, disorientation, and repeated calls to law enforcement reporting she was in danger. Per EDP notes, patient's family reported not knowing how the patient got to the ER. She was IVC'd in ED for concern of her being a danger to self and others related to her altered mental status. She was assessed and recommended for inpatient hospitalization; transferred to [facility] on 05/21 for further stabilization and treatment.   On assessment today, she is more calm and logical for the first 5 minutes or so of the interview.  After this she starts talking about the content of paranoid delusions that she has, her neighbor following her around her house, kicking the walls and what [place] she is in.  She reports the boom course continue to bother her, that she has received the impulses while in the hospital and is called 911.  She insists that her mind is not playing tricks on her and other people are out to get her.  She reports that sleep is better and reports daytime sedation due to medication, but is agreeable to continue current regimen.  Otherwise denies having other side effects to current meds.  Reports appetite is okay.  Reports mood is anxious.  Denies feeling down  depressed or sad.  Denies any SI or HI.  Denies any AH or VH.  She is agreeable to starting Depakote.  Also complains of constipation, we will start MiraLAX.   Total Time spent with patient: 20 min  Past Psychiatric History: See H & P  Past Medical History:  Past Medical History:  Diagnosis Date   Bipolar disorder (HCC)    Depression    Former smoker, stopped smoking many years ago    HSV (herpes simplex virus) infection    Hypothyroid    Low TSH level 05/30/2016   Panic attacks    Thyroid disease     Past Surgical History:  Procedure Laterality Date   ADENOIDECTOMY     OVARIAN CYST SURGERY     TONSILLECTOMY     Family History:  Family History  Problem Relation Age of Onset   Diabetes Father    Heart disease Father    Cancer Maternal Grandmother        breast cancer   Bipolar disorder Mother    Thyroid disease Neg Hx    Family Psychiatric  History: See H & P Social History:  Social History   Substance and Sexual Activity  Alcohol Use Not Currently   Comment: denies     Social History   Substance and Sexual Activity  Drug Use No    Social History   Socioeconomic History   Marital status: Single    Spouse name: Not on file   Number of children: 1   Years of education: Not on file   Highest education level:  High school graduate  Occupational History   Not on file  Tobacco Use   Smoking status: Every Day    Packs/day: .25    Types: Cigarettes   Smokeless tobacco: Never  Vaping Use   Vaping Use: Never used  Substance and Sexual Activity   Alcohol use: Not Currently    Comment: denies   Drug use: No   Sexual activity: Yes    Birth control/protection: None  Other Topics Concern   Not on file  Social History Narrative   Not on file   Social Determinants of Health   Financial Resource Strain: Not on file  Food Insecurity: Patient Declined (06/24/2022)   Hunger Vital Sign    Worried About Running Out of Food in the Last Year: Patient declined     Ran Out of Food in the Last Year: Patient declined  Transportation Needs: Patient Declined (06/24/2022)   PRAPARE - Administrator, Civil Service (Medical): Patient declined    Lack of Transportation (Non-Medical): Patient declined  Physical Activity: Not on file  Stress: Not on file  Social Connections: Not on file    Current Medications: Current Facility-Administered Medications  Medication Dose Route Frequency Provider Last Rate Last Admin   acetaminophen (TYLENOL) tablet 650 mg  650 mg Oral Q6H PRN Eligha Bridegroom, NP   650 mg at 06/28/22 0617   alum & mag hydroxide-simeth (MAALOX/MYLANTA) 200-200-20 MG/5ML suspension 30 mL  30 mL Oral Q4H PRN Eligha Bridegroom, NP       benztropine (COGENTIN) tablet 1 mg  1 mg Oral BID PRN Starleen Blue, NP       Or   benztropine mesylate (COGENTIN) injection 1 mg  1 mg Intramuscular BID PRN Starleen Blue, NP       chlorproMAZINE (THORAZINE) tablet 50 mg  50 mg Oral TID PRN Onie Hayashi, Harrold Donath, MD       Or   chlorproMAZINE (THORAZINE) injection 50 mg  50 mg Intramuscular TID PRN Abbigaile Rockman, Harrold Donath, MD       chlorproMAZINE (THORAZINE) tablet 50 mg  50 mg Oral Q8H Lilianne Delair, MD   50 mg at 07/04/22 0615   diphenhydrAMINE (BENADRYL) capsule 50 mg  50 mg Oral TID PRN Eligha Bridegroom, NP       Or   diphenhydrAMINE (BENADRYL) injection 50 mg  50 mg Intramuscular TID PRN Eligha Bridegroom, NP       divalproex (DEPAKOTE ER) 24 hr tablet 1,000 mg  1,000 mg Oral QHS Demitri Kucinski, Harrold Donath, MD       fluticasone (FLONASE) 50 MCG/ACT nasal spray 1 spray  1 spray Each Nare Daily Carlyn Reichert, MD   1 spray at 07/04/22 4098   hydrocortisone cream 1 %   Topical TID Phineas Inches, MD   Given at 07/04/22 1200   hydrOXYzine (ATARAX) tablet 25 mg  25 mg Oral TID PRN Phineas Inches, MD       LORazepam (ATIVAN) tablet 1 mg  1 mg Oral TID PRN Eligha Bridegroom, NP       Or   LORazepam (ATIVAN) injection 1 mg  1 mg Intramuscular TID PRN Eligha Bridegroom, NP       LORazepam (ATIVAN) tablet 0.5 mg  0.5 mg Oral BID Feliz Herard, MD   0.5 mg at 07/04/22 0738   magnesium hydroxide (MILK OF MAGNESIA) suspension 30 mL  30 mL Oral Daily PRN Eligha Bridegroom, NP   30 mL at 07/04/22 1256   [START ON 07/05/2022] methimazole (TAPAZOLE) tablet 10  mg  10 mg Oral Daily Danne Scardina, MD       propranolol ER (INDERAL LA) 24 hr capsule 60 mg  60 mg Oral Daily Melynda Krzywicki, MD   60 mg at 07/04/22 0737   risperiDONE (RISPERDAL M-TABS) disintegrating tablet 3 mg  3 mg Oral Q12H Darril Patriarca, MD   3 mg at 07/04/22 0736   sodium chloride (OCEAN) 0.65 % nasal spray 1 spray  1 spray Each Nare PRN Carlyn Reichert, MD       traZODone (DESYREL) tablet 50 mg  50 mg Oral QHS PRN Skyelynn Rambeau, Harrold Donath, MD        Lab Results:  No results found for this or any previous visit (from the past 48 hour(s)).   Blood Alcohol level:  Lab Results  Component Value Date   ETH <10 06/23/2022   ETH <10 11/25/2020    Metabolic Disorder Labs: Lab Results  Component Value Date   HGBA1C 5.2 06/27/2022   MPG 103 06/27/2022   MPG 96.8 11/28/2020   Lab Results  Component Value Date   PROLACTIN 13.1 06/28/2019   PROLACTIN 69.3 (H) 09/17/2016   Lab Results  Component Value Date   CHOL 122 06/27/2022   TRIG 162 (H) 06/27/2022   HDL 31 (L) 06/27/2022   CHOLHDL 3.9 06/27/2022   VLDL 32 06/27/2022   LDLCALC 59 06/27/2022   LDLCALC 58 11/28/2020    Physical Findings: AIMS: Facial and Oral Movements Muscles of Facial Expression: None, normal Lips and Perioral Area: None, normal Jaw: None, normal Tongue: None, normal,Extremity Movements Upper (arms, wrists, hands, fingers): None, normal Lower (legs, knees, ankles, toes): None, normal, Trunk Movements Neck, shoulders, hips: None, normal, Overall Severity Severity of abnormal movements (highest score from questions above): None, normal Incapacitation due to abnormal movements: None,  normal Patient's awareness of abnormal movements (rate only patient's report): No Awareness, Dental Status Current problems with teeth and/or dentures?: No Does patient usually wear dentures?: No  CIWA:    COWS:      Psychiatric Specialty Exam: Physical Exam Constitutional:      Appearance: the patient is not toxic-appearing.  Pulmonary:     Effort: Pulmonary effort is normal.  Neurological:     General: No focal deficit present.     Mental Status: the patient is alert and oriented to person, place, and time.   Review of Systems  Respiratory:  Negative for shortness of breath.   Cardiovascular:  Negative for chest pain.  Gastrointestinal:  Negative for abdominal pain, constipation, diarrhea, nausea and vomiting.  Neurological:  Negative for headaches.      BP (!) 129/114 (BP Location: Left Arm)   Pulse 86   Temp 98.5 F (36.9 C) (Oral)   Resp 20   Ht 5\' 5"  (1.651 m)   Wt 88 kg   SpO2 98%   BMI 32.28 kg/m   General Appearance: Fairly Groomed  Eye Contact:  Good  Speech:  Clear and Coherent  Volume:  Normal  Mood: anxious  Affect: Odd, constricted  Thought Process: Tangential  Orientation:  Full (Time, Place, and Person)  Thought Content: +paranoid delusions   Suicidal Thoughts:  No  Homicidal Thoughts:  No  Memory:  Immediate;   Good  Judgement:  poor  Insight:  poor  Psychomotor Activity:  Normal  Concentration:  Concentration: Good  Recall:  Good  Fund of Knowledge: Good  Language: Good  Akathisia:  No  Handed:  not assessed  AIMS (if indicated): not done  Assets:  Communication Skills Desire for Improvement Financial Resources/Insurance Housing Leisure Time Physical Health  ADL's:  Intact  Cognition: WNL  Sleep:  Fair    Blood pressure (!) 129/114, pulse 86, temperature 98.5 F (36.9 C), temperature source Oral, resp. rate 20, height 5\' 5"  (1.651 m), weight 88 kg, SpO2 98 %. Body mass index is 32.28 kg/m.  Treatment Plan Summary: Daily  contact with patient to assess and evaluate symptoms and progress in treatment, Medication management, and Plan     Diagnoses Principal Problem:   Severe manic bipolar 1 disorder with psychotic behavior (HCC) Active Problems:   Hyperthyroidism   GAD (generalized anxiety disorder)   Insomnia   Plan:  Safety and Monitoring: Involuntary admission to inpatient psychiatric unit for safety, stabilization and treatment Daily contact with patient to assess and evaluate symptoms and progress in treatment Patient's case to be discussed in multi-disciplinary team meeting Observation Level : q15 minute checks Vital signs: q12 hours Precautions: Safety     Medications -Continue Risperdal 3 mg twice daily for psychosis and mania -Continue Thorazine 50 mg 3 times daily for psychosis and mania -Start Depakote ER 1000 mg nightly for mania -Continue Ativan at decreased dose of 0.5 mg twice daily -Start MiraLAX scheduled daily for constipation  -Increase methimazole from 5 mg to 10 mg daily for hypothyroidism as per medicine team's recommendation - Continue propranolol ER 60 mg for tachycardia, hypertension, and anxiety  - Will monitor heart rate and blood pressure -Continue hydrocortisone cream as needed to right outer ankle for itching and inflammation -Continue trazodone 50 mg nightly as needed for sleep -Continue hydroxyzine 25 mg 3 times daily as needed for anxiety -Continue Ativan 1 mg BID for GAD (home med)   PRNs - Tylenol 650 mg every 6 hours PRN for mild pain - Maalox 30 mg every 4 hrs PRN for indigestion - Milk of Magnesia as needed every 6 hrs for constipation -Trazodone 50 mg nightly as needed for sleep - Hydroxyzine 25 mg 3 times daily as needed for anxiety  Discharge Planning: Social work and case management to assist with discharge planning and identification of hospital follow-up needs prior to discharge Estimated LOS: 5-7 days Discharge Concerns: Need to establish a  safety plan; Medication compliance and effectiveness Discharge Goals: Return home with outpatient referrals for mental health follow-up including medication management/psychotherapy   I certify that inpatient services furnished can reasonably be expected to improve the patient's condition.    Cristy Hilts, MD 07/04/2022, 4:16 PM   Total Time Spent in Direct Patient Care:  I personally spent 35 minutes on the unit in direct patient care. The direct patient care time included face-to-face time with the patient, reviewing the patient's chart, communicating with other professionals, and coordinating care. Greater than 50% of this time was spent in counseling or coordinating care with the patient regarding goals of hospitalization, psycho-education, and discharge planning needs.   Phineas Inches, MD Psychiatrist

## 2022-07-04 NOTE — BH IP Treatment Plan (Signed)
Interdisciplinary Treatment and Diagnostic Plan Update  07/04/2022 Time of Session: 230 Veronica Perez MRN: [id number]  Principal Diagnosis: Severe manic bipolar 1 disorder with psychotic behavior (HCC)  Secondary Diagnoses: Principal Problem:   Severe manic bipolar 1 disorder with psychotic behavior (HCC) Active Problems:   Hyperthyroidism   GAD (generalized anxiety disorder)   Insomnia   Current Medications:  Current Facility-Administered Medications  Medication Dose Route Frequency Provider Last Rate Last Admin   acetaminophen (TYLENOL) tablet 650 mg  650 mg Oral Q6H PRN Eligha Bridegroom, NP   650 mg at 06/28/22 0617   alum & mag hydroxide-simeth (MAALOX/MYLANTA) 200-200-20 MG/5ML suspension 30 mL  30 mL Oral Q4H PRN Eligha Bridegroom, NP       benztropine (COGENTIN) tablet 1 mg  1 mg Oral BID PRN Starleen Blue, NP       Or   benztropine mesylate (COGENTIN) injection 1 mg  1 mg Intramuscular BID PRN Starleen Blue, NP       chlorproMAZINE (THORAZINE) tablet 50 mg  50 mg Oral TID PRN Phineas Inches, MD       Or   chlorproMAZINE (THORAZINE) injection 50 mg  50 mg Intramuscular TID PRN Massengill, Harrold Donath, MD       chlorproMAZINE (THORAZINE) tablet 50 mg  50 mg Oral Q8H Massengill, Harrold Donath, MD   50 mg at 07/04/22 8469   diphenhydrAMINE (BENADRYL) capsule 50 mg  50 mg Oral TID PRN Eligha Bridegroom, NP       Or   diphenhydrAMINE (BENADRYL) injection 50 mg  50 mg Intramuscular TID PRN Eligha Bridegroom, NP       fluticasone (FLONASE) 50 MCG/ACT nasal spray 1 spray  1 spray Each Nare Daily Carlyn Reichert, MD   1 spray at 07/04/22 6295   hydrocortisone cream 1 %   Topical TID Phineas Inches, MD   Given at 07/04/22 1200   hydrOXYzine (ATARAX) tablet 25 mg  25 mg Oral TID PRN Phineas Inches, MD       LORazepam (ATIVAN) tablet 1 mg  1 mg Oral TID PRN Eligha Bridegroom, NP       Or   LORazepam (ATIVAN) injection 1 mg  1 mg Intramuscular TID PRN Eligha Bridegroom, NP        LORazepam (ATIVAN) tablet 0.5 mg  0.5 mg Oral BID Massengill, Harrold Donath, MD   0.5 mg at 07/04/22 2841   magnesium hydroxide (MILK OF MAGNESIA) suspension 30 mL  30 mL Oral Daily PRN Eligha Bridegroom, NP   30 mL at 07/04/22 1256   [START ON 07/05/2022] methimazole (TAPAZOLE) tablet 10 mg  10 mg Oral Daily Massengill, Nathan, MD       propranolol ER (INDERAL LA) 24 hr capsule 60 mg  60 mg Oral Daily Massengill, Nathan, MD   60 mg at 07/04/22 0737   risperiDONE (RISPERDAL M-TABS) disintegrating tablet 3 mg  3 mg Oral Q12H Massengill, Nathan, MD   3 mg at 07/04/22 0736   sodium chloride (OCEAN) 0.65 % nasal spray 1 spray  1 spray Each Nare PRN Carlyn Reichert, MD       traZODone (DESYREL) tablet 50 mg  50 mg Oral QHS PRN Massengill, Harrold Donath, MD       PTA Medications: Medications Prior to Admission  Medication Sig Dispense Refill Last Dose   Multiple Vitamins-Minerals (MULTIVITAMIN ADULTS) TABS Take 1 tablet by mouth every morning.   Past Week   VRAYLAR 4.5 MG CAPS Take 1 capsule by mouth at bedtime.   Past Week  LORazepam (ATIVAN) 2 MG tablet Take 2 mg by mouth 2 (two) times daily.       Patient Stressors: Financial difficulties   Health problems   Legal issue    Patient Strengths: Manufacturing systems engineer  Supportive family/friends   Treatment Modalities: Medication Management, Group therapy, Case management,  1 to 1 session with clinician, Psychoeducation, Recreational therapy.   Physician Treatment Plan for Primary Diagnosis: Severe manic bipolar 1 disorder with psychotic behavior (HCC) Long Term Goal(s): Improvement in symptoms so as ready for discharge   Short Term Goals: Ability to identify changes in lifestyle to reduce recurrence of condition will improve Ability to verbalize feelings will improve Ability to disclose and discuss suicidal ideas Ability to demonstrate self-control will improve Ability to identify and develop effective coping behaviors will improve Ability to maintain  clinical measurements within normal limits will improve Compliance with prescribed medications will improve  Medication Management: Evaluate patient's response, side effects, and tolerance of medication regimen.  Therapeutic Interventions: 1 to 1 sessions, Unit Group sessions and Medication administration.  Evaluation of Outcomes: Progressing  Physician Treatment Plan for Secondary Diagnosis: Principal Problem:   Severe manic bipolar 1 disorder with psychotic behavior (HCC) Active Problems:   Hyperthyroidism   GAD (generalized anxiety disorder)   Insomnia  Long Term Goal(s): Improvement in symptoms so as ready for discharge   Short Term Goals: Ability to identify changes in lifestyle to reduce recurrence of condition will improve Ability to verbalize feelings will improve Ability to disclose and discuss suicidal ideas Ability to demonstrate self-control will improve Ability to identify and develop effective coping behaviors will improve Ability to maintain clinical measurements within normal limits will improve Compliance with prescribed medications will improve     Medication Management: Evaluate patient's response, side effects, and tolerance of medication regimen.  Therapeutic Interventions: 1 to 1 sessions, Unit Group sessions and Medication administration.  Evaluation of Outcomes: Progressing   RN Treatment Plan for Primary Diagnosis: Severe manic bipolar 1 disorder with psychotic behavior (HCC) Long Term Goal(s): Knowledge of disease and therapeutic regimen to maintain health will improve  Short Term Goals: Ability to remain free from injury will improve, Ability to verbalize frustration and anger appropriately will improve, Ability to demonstrate self-control, Ability to participate in decision making will improve, Ability to verbalize feelings will improve, Ability to disclose and discuss suicidal ideas, Ability to identify and develop effective coping behaviors will  improve, and Compliance with prescribed medications will improve  Medication Management: RN will administer medications as ordered by provider, will assess and evaluate patient's response and provide education to patient for prescribed medication. RN will report any adverse and/or side effects to prescribing provider.  Therapeutic Interventions: 1 on 1 counseling sessions, Psychoeducation, Medication administration, Evaluate responses to treatment, Monitor vital signs and CBGs as ordered, Perform/monitor CIWA, COWS, AIMS and Fall Risk screenings as ordered, Perform wound care treatments as ordered.  Evaluation of Outcomes: Progressing   LCSW Treatment Plan for Primary Diagnosis: Severe manic bipolar 1 disorder with psychotic behavior (HCC) Long Term Goal(s): Safe transition to appropriate next level of care at discharge, Engage patient in therapeutic group addressing interpersonal concerns.  Short Term Goals: Engage patient in aftercare planning with referrals and resources, Increase social support, Increase ability to appropriately verbalize feelings, Increase emotional regulation, Facilitate acceptance of mental health diagnosis and concerns, Facilitate patient progression through stages of change regarding substance use diagnoses and concerns, and Identify triggers associated with mental health/substance abuse issues  Therapeutic Interventions: Assess for  all discharge needs, 1 to 1 time with Child psychotherapist, Explore available resources and support systems, Assess for adequacy in community support network, Educate family and significant other(s) on suicide prevention, Complete Psychosocial Assessment, Interpersonal group therapy.  Evaluation of Outcomes: Progressing   Progress in Treatment: Attending groups: Yes. Participating in groups: Yes. Taking medication as prescribed: Yes. Toleration medication: Yes. Family/Significant other contact made: Yes, individual(s) contacted:   declined  consents  Patient understands diagnosis: Yes. Discussing patient identified problems/goals with staff: Yes. Medical problems stabilized or resolved: Yes. Denies suicidal/homicidal ideation: Yes. Issues/concerns per patient self-inventory: Yes. Other: N/A  New problem(s) identified: No, Describe:  None reported  New Short Term/Long Term Goal(s): medication stabilization, elimination of SI thoughts, development of comprehensive mental wellness plan  Patient Goals:  Medication Stabilization  Discharge Plan or Barriers: Patient recently admitted. CSW will continue to follow and assess for appropriate referrals and possible discharge planning  Reason for Continuation of Hospitalization: Depression Medication stabilization Suicidal ideation Withdrawal symptoms  Estimated Length of Stay:3-7 Days  Last 3 [place] Suicide Severity Risk Score: Flowsheet Row Ad[facility] (Current) [facility] from 06/23/2022 in [facility] at [facility] ED from 10/30/2021 in [facility] Department at [facility]  C-SSRS RISK CATEGORY No Risk No Risk No Risk       Last [facility] 2/9 Scores:    11/12/2021    1:28 PM 12/26/2020   10:38 AM 05/27/2016    2:22 PM  Depression screen PHQ 2/9  Decreased Interest 0 1 3  Down, Depressed, Hopeless 0 1 3  PHQ - 2 Score 0 2 6  Altered sleeping  2 1  Tired, decreased energy  2 3  Change in appetite  0 0  Feeling bad or failure about yourself   0 0  Trouble concentrating  1 0  Moving slowly or fidgety/restless  0 0  Suicidal thoughts   0  PHQ-9 Score  7 10  Difficult doing work/chores  Somewhat difficult     medication stabilization, elimination of SI thoughts, development of comprehensive mental wellness plan.    Scribe for Treatment Team: Ane Payment, LCSW 07/04/2022 3:32 PM

## 2022-07-05 NOTE — Group Note (Signed)
Date:  07/05/2022 Time:  9:13 PM  Group Topic/Focus:  Wrap-Up Group:   The focus of this group is to help patients review their daily goal of treatment and discuss progress on daily workbooks.    Participation Level:  Active  Participation Quality:  Appropriate  Affect:  Appropriate  Cognitive:  Appropriate  Insight: Appropriate  Engagement in Group:  Engaged  Modes of Intervention:  Education and Exploration  Additional Comments:  Patient attended and participated in group tonight. She reports tht today she has a visit with her daughter. The food was great.  Lita Mains [facility] 07/05/2022, 9:13 PM

## 2022-07-05 NOTE — Group Note (Signed)
Date:  07/05/2022 Time:  9:24 AM  Group Topic/Focus:  Goals Group:   The focus of this group is to help patients establish daily goals to achieve during treatment and discuss how the patient can incorporate goal setting into their daily lives to aide in recovery. Orientation:   The focus of this group is to educate the patient on the purpose and policies of crisis stabilization and provide a format to answer questions about their admission.  The group details unit policies and expectations of patients while admitted.   Participation Level:  Did Not Attend   Veronica Perez 07/05/2022, 9:24 AM

## 2022-07-05 NOTE — Progress Notes (Signed)
   07/04/22 2026  Psych Admission Type (Psych Patients Only)  Admission Status Involuntary  Psychosocial Assessment  Patient Complaints None  Eye Contact Fair  Facial Expression Animated  Affect Preoccupied  Speech Tangential  Interaction Assertive  Motor Activity Fidgety  Appearance/Hygiene Excess makeup  Behavior Characteristics Cooperative;Calm  Mood Pleasant  Thought Process  Coherency Circumstantial  Content WDL  Delusions Paranoid  Perception WDL  Hallucination None reported or observed  Judgment Impaired  Confusion None  Danger to Self  Current suicidal ideation? Denies  Agreement Not to Harm Self Yes  Description of Agreement verbal contract for safety  Danger to Others  Danger to Others None reported or observed   Pt reports 0/10 depression and anxiety. t is pleasant and cooperative. Pt was offered support and encouragement. Pt was given scheduled medications. Q 15 minute checks were done for safety. Pt interacts well with peers and staff. Pt has no complaints.Pt receptive to treatment and safety maintained on unit.

## 2022-07-05 NOTE — Progress Notes (Signed)
   07/05/22 0600  15 Minute Checks  Location Bedroom  Visual Appearance Calm  Behavior Sleeping  Sleep (Behavioral Health Patients Only)  Calculate sleep? (Click Yes once per 24 hr at 0600 safety check) Yes  Documented sleep last 24 hours 7.5

## 2022-07-05 NOTE — Progress Notes (Signed)
Patient denies SI, HI and AVH this shift. Patient has had no incidents of behavioral dyscontrol. Patient has attended groups and been compliant with medications.    Assess patient for safety, offer medications as prescribed, engage patient in 1:1 staff talks.   Patient able to contract for safety. Continue to monitor as planned.

## 2022-07-05 NOTE — Progress Notes (Signed)
   07/05/22 2010  Psych Admission Type (Psych Patients Only)  Admission Status Involuntary  Psychosocial Assessment  Patient Complaints Other (Comment) (pt c/o constipation, MOM given)  Eye Contact Brief  Facial Expression Animated  Affect Appropriate to circumstance  Speech Logical/coherent  Interaction Assertive  Motor Activity Restless  Appearance/Hygiene Meticulous  Behavior Characteristics Appropriate to situation;Cooperative  Mood Pleasant  Thought Process  Coherency Circumstantial  Content WDL  Delusions Paranoid  Perception WDL  Hallucination None reported or observed  Judgment Poor  Confusion None  Danger to Self  Current suicidal ideation? Denies
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
## 2022-07-06 LAB — T3, FREE: T3, Free: 3.9 pg/mL (ref 2.0–4.4)

## 2022-07-06 NOTE — [facility] Group Notes (Signed)
[facility] Group Notes:  (Nursing/MHT/Case Management/Adjunct)  Date:  07/06/2022  Time:  8:41 PM  Type of Therapy:   Wrap-up group  Participation Level:  Active  Participation Quality:  Appropriate  Affect:  Appropriate  Cognitive:  Appropriate  Insight:  Appropriate  Engagement in Group:  Engaged  Modes of Intervention:  Education  Summary of Progress/Problems: Pt goal to see fiancee, pt reports she did visit w/ him. Day was 10/10.   Noah Delaine 07/06/2022, 8:41 PM

## 2022-07-06 NOTE — Progress Notes (Signed)
Patient has been up and active on the unit, attended group this evening and has voiced no complaints. Patient currently denies having pain, -si/hi/a/v hall. Support and encouragement offered, safety maintained on unit, will continue to monitor.   07/06/22 2102  Psych Admission Type (Psych Patients Only)  Admission Status Involuntary  Psychosocial Assessment  Patient Complaints None  Eye Contact Brief  Facial Expression Flat  Affect Appropriate to circumstance  Speech Logical/coherent  Interaction Assertive  Motor Activity Restless  Appearance/Hygiene Meticulous  Behavior Characteristics Cooperative;Appropriate to situation  Mood Pleasant  Thought Process  Coherency Circumstantial  Content WDL  Delusions None reported or observed  Perception WDL  Hallucination None reported or observed  Judgment Poor  Confusion None  Danger to Self  Current suicidal ideation? Denies

## 2022-07-06 NOTE — Progress Notes (Signed)
[facility] MD Progress Note  07/06/2022 7:36 AM Veronica Perez  MRN:  [id number] Principal Problem: Severe manic bipolar 1 disorder with psychotic behavior (HCC) Diagnosis: Principal Problem:   Severe manic bipolar 1 disorder with psychotic behavior (HCC) Active Problems:   Hyperthyroidism   GAD (generalized anxiety disorder)   Insomnia  Reason For Hospitalization: Veronica Perez is a 47 year old Caucasian female with past psychiatric history of bipolar 1 disorder who presented to the [facility] on 06/23/22  with disorganized and pressured speech, disoriented unable to state how or reason for presenting to ED. Per collateral information obtained by the ED provider from pt's family, patient had not been adequately caring for herself, experiencing insomnia, disorientation, and repeated calls to law enforcement reporting she was in danger. Per EDP notes, patient's family reported not knowing how the patient got to the ER. She was IVC'd in ED for concern of her being a danger to self and others related to her altered mental status. She was assessed and recommended for inpatient hospitalization; transferred to [facility] on 05/21 for further stabilization and treatment.   RN reports that she is overall less paranoid and appears less histrionic and that her hair is no longer in pigtails, and she has toned down her make-up.  No behavioral PRNs required.  On assessment today, she is more calm and logical for the duration of assessment.  She reports that she believes the Thorazine is making her more somnolent throughout the day; although she was more awake and active as the day progressed yesterday. She also endorses subjectively feeling constipated although she did have a BM last night. Otherwise denies having other side effects to current meds.  Reports appetite is good.  Reports mood is "tired."  Denies feeling down depressed or sad.  Denies any SI or HI.  Denies any AH or VH.  She does not make paranoid statements. She  reports that her daughter visited with her last night, and the two played board games. She mimics one of the voices in one of the games and laughs appropriately, reporting that they had a lot of fun. She did not inquire about discharge until prompted to ask any questions, and she was receptive to being told that she was moving in the right direction but her primary team would be formulating a plan with her moving forward.   Total Time spent with patient: 30 min  Past Psychiatric History: See H & P  Past Medical History:  Past Medical History:  Diagnosis Date   Bipolar disorder (HCC)    Depression    Former smoker, stopped smoking many years ago    HSV (herpes simplex virus) infection    Hypothyroid    Low TSH level 05/30/2016   Panic attacks    Thyroid disease     Past Surgical History:  Procedure Laterality Date   ADENOIDECTOMY     OVARIAN CYST SURGERY     TONSILLECTOMY     Family History:  Family History  Problem Relation Age of Onset   Diabetes Father    Heart disease Father    Cancer Maternal Grandmother        breast cancer   Bipolar disorder Mother    Thyroid disease Neg Hx    Family Psychiatric  History: See H & P Social History:  Social History   Substance and Sexual Activity  Alcohol Use Not Currently   Comment: denies     Social History   Substance and Sexual Activity  Drug  Use No    Social History   Socioeconomic History   Marital status: Single    Spouse name: Not on file   Number of children: 1   Years of education: Not on file   Highest education level: High school graduate  Occupational History   Not on file  Tobacco Use   Smoking status: Every Day    Packs/day: .25    Types: Cigarettes   Smokeless tobacco: Never  Vaping Use   Vaping Use: Never used  Substance and Sexual Activity   Alcohol use: Not Currently    Comment: denies   Drug use: No   Sexual activity: Yes    Birth control/protection: None  Other Topics Concern   Not on  file  Social History Narrative   Not on file   Social Determinants of Health   Financial Resource Strain: Not on file  Food Insecurity: Patient Declined (06/24/2022)   Hunger Vital Sign    Worried About Running Out of Food in the Last Year: Patient declined    Ran Out of Food in the Last Year: Patient declined  Transportation Needs: Patient Declined (06/24/2022)   PRAPARE - Administrator, Civil Service (Medical): Patient declined    Lack of Transportation (Non-Medical): Patient declined  Physical Activity: Not on file  Stress: Not on file  Social Connections: Not on file    Current Medications: Current Facility-Administered Medications  Medication Dose Route Frequency Provider Last Rate Last Admin   acetaminophen (TYLENOL) tablet 650 mg  650 mg Oral Q6H PRN Eligha Bridegroom, NP   650 mg at 06/28/22 0617   alum & mag hydroxide-simeth (MAALOX/MYLANTA) 200-200-20 MG/5ML suspension 30 mL  30 mL Oral Q4H PRN Eligha Bridegroom, NP       benztropine (COGENTIN) tablet 1 mg  1 mg Oral BID PRN Starleen Blue, NP       Or   benztropine mesylate (COGENTIN) injection 1 mg  1 mg Intramuscular BID PRN Starleen Blue, NP       chlorproMAZINE (THORAZINE) tablet 50 mg  50 mg Oral TID PRN Massengill, Harrold Donath, MD       Or   chlorproMAZINE (THORAZINE) injection 50 mg  50 mg Intramuscular TID PRN Massengill, Harrold Donath, MD       chlorproMAZINE (THORAZINE) tablet 50 mg  50 mg Oral Q8H Massengill, Nathan, MD   50 mg at 07/06/22 5284   diphenhydrAMINE (BENADRYL) capsule 50 mg  50 mg Oral TID PRN Eligha Bridegroom, NP       Or   diphenhydrAMINE (BENADRYL) injection 50 mg  50 mg Intramuscular TID PRN Eligha Bridegroom, NP       divalproex (DEPAKOTE ER) 24 hr tablet 1,000 mg  1,000 mg Oral QHS Massengill, Nathan, MD   1,000 mg at 07/05/22 2049   fluticasone (FLONASE) 50 MCG/ACT nasal spray 1 spray  1 spray Each Nare Daily Carlyn Reichert, MD   1 spray at 07/06/22 0725   hydrocortisone cream 1 %   Topical  TID Phineas Inches, MD   Given at 07/06/22 0725   hydrOXYzine (ATARAX) tablet 25 mg  25 mg Oral TID PRN Phineas Inches, MD       LORazepam (ATIVAN) tablet 1 mg  1 mg Oral TID PRN Eligha Bridegroom, NP       Or   LORazepam (ATIVAN) injection 1 mg  1 mg Intramuscular TID PRN Eligha Bridegroom, NP       LORazepam (ATIVAN) tablet 0.5 mg  0.5 mg Oral  BID Massengill, Harrold Donath, MD   0.5 mg at 07/06/22 1610   magnesium hydroxide (MILK OF MAGNESIA) suspension 30 mL  30 mL Oral Daily PRN Eligha Bridegroom, NP   30 mL at 07/05/22 1957   methimazole (TAPAZOLE) tablet 10 mg  10 mg Oral Daily Massengill, Harrold Donath, MD   10 mg at 07/06/22 0726   polyethylene glycol (MIRALAX / GLYCOLAX) packet 17 g  17 g Oral Daily Massengill, Harrold Donath, MD   17 g at 07/06/22 0725   propranolol ER (INDERAL LA) 24 hr capsule 60 mg  60 mg Oral Daily Massengill, Harrold Donath, MD   60 mg at 07/06/22 0726   risperiDONE (RISPERDAL M-TABS) disintegrating tablet 3 mg  3 mg Oral Q12H Massengill, Nathan, MD   3 mg at 07/06/22 9604   sodium chloride (OCEAN) 0.65 % nasal spray 1 spray  1 spray Each Nare PRN Carlyn Reichert, MD       traZODone (DESYREL) tablet 50 mg  50 mg Oral QHS PRN Massengill, Harrold Donath, MD        Lab Results:  Results for orders placed or performed during the hospital encounter of 06/24/22 (from the past 48 hour(s))  TSH     Status: Abnormal   Collection Time: 07/04/22  6:42 PM  Result Value Ref Range   TSH <0.010 (L) 0.350 - 4.500 uIU/mL    Comment: Performed by a 3rd Generation assay with a functional sensitivity of <=0.01 uIU/mL. Performed at [facility], [address], [place], [place] [address]   T4, free     Status: None   Collection Time: 07/04/22  6:42 PM  Result Value Ref Range   Free T4 1.10 0.61 - 1.12 ng/dL    Comment: (NOTE) Biotin ingestion may interfere with free T4 tests. If the results are inconsistent with the TSH level, previous test results, or the clinical presentation, then  consider biotin interference. If needed, order repeat testing after stopping biotin. Performed at [facility], [address] [place], [place] [address]      Blood Alcohol level:  Lab Results  Component Value Date   [facility] <10 06/23/2022   ETH <10 11/25/2020    Metabolic Disorder Labs: Lab Results  Component Value Date   HGBA1C 5.2 06/27/2022   MPG 103 06/27/2022   MPG 96.8 11/28/2020   Lab Results  Component Value Date   PROLACTIN 13.1 06/28/2019   PROLACTIN 69.3 (H) 09/17/2016   Lab Results  Component Value Date   CHOL 122 06/27/2022   TRIG 162 (H) 06/27/2022   HDL 31 (L) 06/27/2022   CHOLHDL 3.9 06/27/2022   VLDL 32 06/27/2022   LDLCALC 59 06/27/2022   LDLCALC 58 11/28/2020    Physical Findings: AIMS: Facial and Oral Movements Muscles of Facial Expression: None, normal Lips and Perioral Area: None, normal Jaw: None, normal Tongue: None, normal,Extremity Movements Upper (arms, wrists, hands, fingers): None, normal Lower (legs, knees, ankles, toes): None, normal, Trunk Movements Neck, shoulders, hips: None, normal, Overall Severity Severity of abnormal movements (highest score from questions above): None, normal Incapacitation due to abnormal movements: None, normal Patient's awareness of abnormal movements (rate only patient's report): No Awareness, Dental Status Current problems with teeth and/or dentures?: No Does patient usually wear dentures?: No     Psychiatric Specialty Exam: Physical Exam Constitutional:      Appearance: the patient is not toxic-appearing.  Pulmonary:     Effort: Pulmonary effort is normal.  Neurological:     General: No  focal deficit present.     Mental Status: the patient is alert and oriented to person, place, and time.   Review of Systems  Respiratory:  Negative for shortness of breath.   Cardiovascular:  Negative for chest pain.  Gastrointestinal:  Negative for abdominal pain, constipation, diarrhea, nausea and  vomiting.  Neurological:  Negative for headaches.      BP 109/66 (BP Location: Right Arm)   Pulse 88   Temp 98 F (36.7 C) (Oral)   Resp 20   Ht 5\' 5"  (1.651 m)   Wt 88 kg   SpO2 98%   BMI 32.28 kg/m   General Appearance: Fairly Groomed; hair no longer in pigtails; patient appeared somnolent but did sit up to engage  Eye Contact:  Fair  Speech:  Clear and Coherent  Volume:  Normal  Mood: "tired"  Affect: Congruent, but appropriate as assessment progressed  Thought Process: Linear; WDL  Orientation:  Full (Time, Place, and Person)  Thought Content: WDL, logical  Suicidal Thoughts:  No  Homicidal Thoughts:  No  Memory:  Immediate;   Good  Judgement:  Fair, improving  Insight: Fair, improving  Psychomotor Activity:  Retardation  Concentration:  Concentration: Fair  Recall:  Good  Fund of Knowledge: Good  Language: Good  Akathisia:  No  Handed:  not assessed  AIMS (if indicated): not done  Assets:  Communication Skills Desire for Improvement Financial Resources/Insurance Housing Leisure Time Physical Health  ADL's:  Intact  Cognition: WNL  Sleep:  Good    Blood pressure 109/66, pulse 88, temperature 98 F (36.7 C), temperature source Oral, resp. rate 20, height 5\' 5"  (1.651 m), weight 88 kg, SpO2 98 %. Body mass index is 32.28 kg/m.  Treatment Plan Summary: Patient is compliant with all scheduled medications and sustained positive changes noted in patient's sleep, appearance, mood, and thought process since starting Depakote. Although patient somnolent in the mornings, which resolves as the day progresses, expect this to improve in the next day or so. Patient appropriate in assessment without paranoid ideation. Will recommend continuation of medication regimen.   Diagnoses Principal Problem:   Severe manic bipolar 1 disorder with psychotic behavior (HCC) Active Problems:   Hyperthyroidism   GAD (generalized anxiety disorder)   Insomnia   Plan:  Safety and  Monitoring: Involuntary admission to inpatient psychiatric unit for safety, stabilization and treatment Daily contact with patient to assess and evaluate symptoms and progress in treatment Patient's case to be discussed in multi-disciplinary team meeting Observation Level : q15 minute checks Vital signs: q12 hours Precautions: Safety     Medications -Continue Risperdal 3 mg twice daily for psychosis and mania -Continue Thorazine 50 mg 3 times daily for psychosis and mania -Continue Depakote ER 1000 mg nightly for mania  -VPA level, CMP, and CBC to be obtained 6/3 PM -Continue Ativan 0.5 mg twice daily -Continue MiraLAX scheduled daily for constipation  -Continue methimazole 10 mg daily for hypothyroidism as per medicine team's recommendation - Continue propranolol ER 60 mg for tachycardia, hypertension, and anxiety - Will monitor heart rate and blood pressure -Continue hydrocortisone cream as needed to right outer ankle for itching and inflammation   PRNs - Tylenol 650 mg every 6 hours PRN for mild pain - Maalox 30 mg every 4 hrs PRN for indigestion - Milk of Magnesia as needed every 6 hrs for constipation -Trazodone 50 mg nightly as needed for sleep - Hydroxyzine 25 mg 3 times daily as needed for anxiety  Discharge  Planning: Social work and case management to assist with discharge planning and identification of hospital follow-up needs prior to discharge Estimated LOS: 5-7 days Discharge Concerns: Need to establish a safety plan; Medication compliance and effectiveness Discharge Goals: Return home with outpatient referrals for mental health follow-up including medication management/psychotherapy   I certify that inpatient services furnished can reasonably be expected to improve the patient's condition.    Lamar Sprinkles, MD 07/06/2022, 7:36 AM

## 2022-07-06 NOTE — Progress Notes (Signed)
Patient denies SI, HI and AVH this shift. Patient has had no incidents of behavioral dyscontrol. Patient has attended groups and been compliant with medications.   Assess patient for safety, offer medications as prescribed, engage patient in 1:1 staff talks.  Patient able to contract for safety. Continue to monitor as planned.  

## 2022-07-07 MED ORDER — CHLORPROMAZINE HCL 25 MG PO TABS
25.0000 mg | ORAL_TABLET | Freq: Three times a day (TID) | ORAL | Status: DC
  Administered 2022-07-07 – 2022-07-09 (×7): 25 mg via ORAL
  Filled 2022-07-07 (×12): qty 1

## 2022-07-07 MED ORDER — MEDROXYPROGESTERONE ACETATE 150 MG/ML IM SUSP
150.0000 mg | Freq: Once | INTRAMUSCULAR | Status: AC
  Administered 2022-07-07: 150 mg via INTRAMUSCULAR
  Filled 2022-07-07: qty 1

## 2022-07-07 NOTE — Progress Notes (Signed)
   07/07/22 0540  15 Minute Checks  Location Bedroom  Visual Appearance Calm  Behavior Sleeping  Sleep (Behavioral Health Patients Only)  Calculate sleep? (Click Yes once per 24 hr at 0600 safety check) Yes  Documented sleep last 24 hours 9

## 2022-07-07 NOTE — Progress Notes (Signed)
Patient denies SI, HI and AVH this shift. Patient has had no incidents of behavioral dyscontrol. Patient has attended groups and been compliant with medications.   Assess patient for safety, offer medications as prescribed, engage patient in 1:1 staff talks.  Patient able to contract for safety. Continue to monitor as planned.  

## 2022-07-07 NOTE — Group Note (Signed)
Recreation Therapy Group Note   Group Topic:Personal Development  Group Date: 07/07/2022 Start Time: 1010 End Time: 1100 Facilitators: Donovan Persley-McCall, LRT,CTRS Location: [address] Dayroom   Goal Area(s) Addresses: Patient will successfully practice self-awareness and reflect on current values, lifestyle, and habits.   Patient will identify how skills learned during activity can be used to reach post d/c goals and make healthy changes.    Group Description: My DBT House. LRT and patients held a group discussion on behavioral expectations and group topic promoting self-awareness and reflection. Writer drew a diagram of a house and used interactive methods to incorporate patients in the labelling process, allowing for open response and teach back to ensure understanding. Patients were given their own sheet to label as the group shared ideas.  Sections and labels included:        Foundation- Values that govern their life       Walls- People and things that support them through the day to day       Door- Things they hide from others        Basement- Behaviors they are trying to gain control of or areas of their life they want to change       1st Floor- Emotions they want to experience more often, more fully, or in a healthier way       2nd Floor- List of all the things they are happy about or want to feel happy about       3rd Floor/Attic- List of what a "life worth living" would look like for them       Roof- People or factors that protect them       Chimney- Challenging emotions and triggers they experience       Smoke- Ways they "blow off steam"      Yard Sign- Things they are proud of and want others to see       Sunshine- What brings them joy  Patients were instructed to complete this with realistic answers, not filtering responses. Patients were offered debriefing on the activity and encouraged to speak on areas they like about what they listed and what they want to see change  within their diagram post discharge.    Affect/Mood: N/A   Participation Level: Did not attend    Clinical Observations/Individualized Feedback:     Plan: Continue to engage patient in RT group sessions 2-3x/week.   Jonathen Rathman-McCall, LRT,CTRS 07/07/2022 1:20 PM

## 2022-07-07 NOTE — Progress Notes (Signed)
   07/07/22 1950  Psych Admission Type (Psych Patients Only)  Admission Status Involuntary  Psychosocial Assessment  Patient Complaints None  Eye Contact Fair  Facial Expression Flat  Affect Appropriate to circumstance  Speech Logical/coherent  Interaction Assertive  Motor Activity Fidgety  Appearance/Hygiene Unremarkable  Behavior Characteristics Cooperative;Appropriate to situation  Mood Pleasant  Thought Process  Coherency Circumstantial  Content WDL  Delusions None reported or observed  Perception WDL  Hallucination None reported or observed  Judgment Poor  Confusion None  Danger to Self  Current suicidal ideation? Denies  Self-Injurious Behavior No self-injurious ideation or behavior indicators observed or expressed   Agreement Not to Harm Self Yes  Description of Agreement Verbal  Danger to Others  Danger to Others None reported or observed

## 2022-07-07 NOTE — BHH Group Notes (Signed)
Adult Psychoeducational Group Note  Date:  07/07/2022 Time:  9:15 PM  Group Topic/Focus:  Wrap-Up Group:   The focus of this group is to help patients review their daily goal of treatment and discuss progress on daily workbooks.  Participation Level:  Active  Participation Quality:  Appropriate  Affect:  Appropriate  Cognitive:  Appropriate  Insight: Appropriate  Engagement in Group:  Developing/Improving  Modes of Intervention:  Discussion  Additional Comments:  Pt stated her goal for today was to focus on her treatment plan and discuss her medication issues with her treatment team. Pt stated she accomplished her goals today. Pt stated she talked with her doctor and her social worker about her care today. Pt rated her overall day a 10. Pt stated she was able to contact her boyfriend and a family friend today which improved her overall day. Pt stated she felt better about herself tonight. Pt stated she was able to attend all groups held today. Pt stated she was able to attend all meals. Pt stated she took all medications provided today. Pt stated her appetite was pretty good today. Pt rated her sleep last night was pretty good. Pt stated the goal tonight was to get some rest. Pt stated she had no physical pain tonight. Pt deny visual hallucinations and auditory issues tonight. Pt denies thoughts of harming herself or others. Pt stated she would alert staff if anything changed.  Felipa Furnace 07/07/2022, 9:15 PM

## 2022-07-07 NOTE — Progress Notes (Signed)
[facility] MD Progress Note  07/07/2022 1:45 PM Veronica Perez  MRN:  [id number]  Subjective:   Veronica Perez is a 47 year old Caucasian female with past psychiatric history of bipolar 1 disorder who presented to the [facility] on 06/23/22 with disorganized and pressured speech, disoriented unable to state how or reason for presenting to ED. Per collateral information obtained by the ED provider from pt's family, patient had not been adequately caring for herself, experiencing insomnia, disorientation, and repeated calls to law enforcement reporting she was in danger. Per EDP notes, patient's family reported not knowing how the patient got to the ER. She was IVC'd in ED for concern of her being a danger to self and others related to her altered mental status. She was assessed and recommended for inpatient hospitalization; transferred to [facility] on 05/21 for further stabilization and treatment.   On my assessment today, patient has more linear thinking, she is less tangential, no appreciable flight of ideas.  This is a significant improvement.  She does not spontaneously report having any paranoid delusions, but under further investigation, the delusional thought constructs do persist but are not impairing her ability to interact with others.  She is hesitant about returning home due to her neighbor bothering her, is worried about the pulm course with is not worried about any physical threats they calls to her.  She reports that sleep at night is good.  Reports excessive daytime sedation, attributed to the Thorazine.  Denies any side effects in starting Depakote.  Denies she is on any current contraceptive method, and is willing to start the Depo-Provera injection every 3 months.  We extensively discussed that Benadryl side effects to the fetus, and also risk of developmental delay, if she were to become pregnant while taking Depakote.  She voices understanding of this.  At this time she denies any AH or VH.  Denies any SI  or HI.  Reports that mood is anxious.  Reports that appetite is okay.  Concentration is better since my last evaluation 4 days ago.  She reports having a soft bowel movement this morning.  She gives this Clinical research associate permission to call her Veronica Perez, Veronica Perez and at [phone]    Principal Problem: Severe manic bipolar 1 disorder with psychotic behavior (HCC) Diagnosis: Principal Problem:   Severe manic bipolar 1 disorder with psychotic behavior (HCC) Active Problems:   Hyperthyroidism   GAD (generalized anxiety disorder)   Insomnia  Total Time spent with patient: 20 minutes  Past Psychiatric History: See H&P the  Past Medical History:  Past Medical History:  Diagnosis Date   Bipolar disorder (HCC)    Depression    Former smoker, stopped smoking many years ago    HSV (herpes simplex virus) infection    Hypothyroid    Low TSH level 05/30/2016   Panic attacks    Thyroid disease     Past Surgical History:  Procedure Laterality Date   ADENOIDECTOMY     OVARIAN CYST SURGERY     TONSILLECTOMY     Family History:  Family History  Problem Relation Age of Onset   Diabetes Father    Heart disease Father    Cancer Maternal Grandmother        breast cancer   Bipolar disorder Mother    Thyroid disease Neg Hx    Family Psychiatric  History: See H&P Social History:  Social History   Substance and Sexual Activity  Alcohol Use Not Currently   Comment: denies  Social History   Substance and Sexual Activity  Drug Use No    Social History   Socioeconomic History   Marital status: Single    Spouse name: Not on file   Number of children: 1   Years of education: Not on file   Highest education level: High school graduate  Occupational History   Not on file  Tobacco Use   Smoking status: Every Day    Packs/day: .25    Types: Cigarettes   Smokeless tobacco: Never  Vaping Use   Vaping Use: Never used  Substance and Sexual Activity   Alcohol use: Not Currently     Comment: denies   Drug use: No   Sexual activity: Yes    Birth control/protection: None  Other Topics Concern   Not on file  Social History Narrative   Not on file   Social Determinants of Health   Financial Resource Strain: Not on file  Food Insecurity: Patient Declined (06/24/2022)   Hunger Vital Sign    Worried About Running Out of Food in the Last Year: Patient declined    Ran Out of Food in the Last Year: Patient declined  Transportation Needs: Patient Declined (06/24/2022)   PRAPARE - Administrator, Civil Service (Medical): Patient declined    Lack of Transportation (Non-Medical): Patient declined  Physical Activity: Not on file  Stress: Not on file  Social Connections: Not on file   Additional Social History:                           Current Medications: Current Facility-Administered Medications  Medication Dose Route Frequency Provider Last Rate Last Admin   acetaminophen (TYLENOL) tablet 650 mg  650 mg Oral Q6H PRN Veronica Bridegroom, NP   650 mg at 06/28/22 0617   alum & mag hydroxide-simeth (MAALOX/MYLANTA) 200-200-20 MG/5ML suspension 30 mL  30 mL Oral Q4H PRN Veronica Bridegroom, NP       benztropine (COGENTIN) tablet 1 mg  1 mg Oral BID PRN Veronica Blue, NP       Or   benztropine mesylate (COGENTIN) injection 1 mg  1 mg Intramuscular BID PRN Veronica Blue, NP       chlorproMAZINE (THORAZINE) tablet 50 mg  50 mg Oral TID PRN Veronica Perez, Veronica Donath, MD       Or   chlorproMAZINE (THORAZINE) injection 50 mg  50 mg Intramuscular TID PRN Veronica Perez, Veronica Donath, MD       chlorproMAZINE (THORAZINE) tablet 25 mg  25 mg Oral Q8H Veronica Neiswender, MD       diphenhydrAMINE (BENADRYL) capsule 50 mg  50 mg Oral TID PRN Veronica Bridegroom, NP       Or   diphenhydrAMINE (BENADRYL) injection 50 mg  50 mg Intramuscular TID PRN Veronica Bridegroom, NP       divalproex (DEPAKOTE ER) 24 hr tablet 1,000 mg  1,000 mg Oral QHS Veronica Guallpa, MD   1,000 mg at 07/06/22  2054   fluticasone (FLONASE) 50 MCG/ACT nasal spray 1 spray  1 spray Each Nare Daily Veronica Reichert, MD   1 spray at 07/07/22 0815   hydrocortisone cream 1 %   Topical TID Phineas Inches, MD   Given at 07/07/22 1147   hydrOXYzine (ATARAX) tablet 25 mg  25 mg Oral TID PRN Jacqueli Pangallo, Veronica Donath, MD       LORazepam (ATIVAN) tablet 1 mg  1 mg Oral TID PRN Veronica Bridegroom, NP  Or   LORazepam (ATIVAN) injection 1 mg  1 mg Intramuscular TID PRN Veronica Bridegroom, NP       LORazepam (ATIVAN) tablet 0.5 mg  0.5 mg Oral BID Mikeisha Lemonds, MD   0.5 mg at 07/07/22 1145   magnesium hydroxide (MILK OF MAGNESIA) suspension 30 mL  30 mL Oral Daily PRN Veronica Bridegroom, NP   30 mL at 07/05/22 1957   medroxyPROGESTERone (DEPO-PROVERA) injection 150 mg  150 mg Intramuscular Once Remington Highbaugh, Veronica Donath, MD       methimazole (TAPAZOLE) tablet 10 mg  10 mg Oral Daily Bria Portales, Veronica Donath, MD   10 mg at 07/07/22 0815   polyethylene glycol (MIRALAX / GLYCOLAX) packet 17 g  17 g Oral Daily Lyrica Mcclarty, Veronica Donath, MD   17 g at 07/06/22 0725   propranolol ER (INDERAL LA) 24 hr capsule 60 mg  60 mg Oral Daily Zakhari Fogel, Veronica Donath, MD   60 mg at 07/07/22 0815   risperiDONE (RISPERDAL M-TABS) disintegrating tablet 3 mg  3 mg Oral Q12H Heidie Krall, MD   3 mg at 07/07/22 0815   sodium chloride (OCEAN) 0.65 % nasal spray 1 spray  1 spray Each Nare PRN Veronica Reichert, MD       traZODone (DESYREL) tablet 50 mg  50 mg Oral QHS PRN Maribeth Jiles, Veronica Donath, MD        Lab Results: No results found for this or any previous visit (from the past 48 hour(s)).  Blood Alcohol level:  Lab Results  Component Value Date   ETH <10 06/23/2022   ETH <10 11/25/2020    Metabolic Disorder Labs: Lab Results  Component Value Date   HGBA1C 5.2 06/27/2022   MPG 103 06/27/2022   MPG 96.8 11/28/2020   Lab Results  Component Value Date   PROLACTIN 13.1 06/28/2019   PROLACTIN 69.3 (H) 09/17/2016   Lab Results  Component Value Date    CHOL 122 06/27/2022   TRIG 162 (H) 06/27/2022   HDL 31 (L) 06/27/2022   CHOLHDL 3.9 06/27/2022   VLDL 32 06/27/2022   LDLCALC 59 06/27/2022   LDLCALC 58 11/28/2020    Physical Findings: AIMS: Facial and Oral Movements Muscles of Facial Expression: None, normal Lips and Perioral Area: None, normal Jaw: None, normal Tongue: None, normal,Extremity Movements Upper (arms, wrists, hands, fingers): None, normal Lower (legs, knees, ankles, toes): None, normal, Trunk Movements Neck, shoulders, hips: None, normal, Overall Severity Severity of abnormal movements (highest score from questions above): None, normal Incapacitation due to abnormal movements: None, normal Patient's awareness of abnormal movements (rate only patient's report): No Awareness, Dental Status Current problems with teeth and/or dentures?: No Does patient usually wear dentures?: No  CIWA:    COWS:     No EPS on my exam today  Musculoskeletal: Strength & Muscle Tone: within normal limits Gait & Station: normal Patient leans: N/A  Psychiatric Specialty Exam:  Presentation  General Appearance:  Casual; Fairly Groomed  Eye Contact: Good  Speech: Normal Rate  Speech Volume: Normal  Handedness: Right   Mood and Affect  Mood: Anxious  Affect: Full Range   Thought Process  Thought Processes: Linear  Descriptions of Associations:Intact  Orientation:Full (Time, Place and Person)  Thought Content:Illogical  History of Schizophrenia/Schizoaffective disorder:Yes  Duration of Psychotic Symptoms:Less than six months  Hallucinations:Hallucinations: None  Ideas of Reference:Delusions; Paranoia  Suicidal Thoughts:Suicidal Thoughts: No  Homicidal Thoughts:Homicidal Thoughts: No   Sensorium  Memory: Immediate Fair; Recent Fair; Remote Fair  Judgment: Impaired  Insight: Lacking  Executive Functions  Concentration: Fair  Attention Span: Fair  Recall: Poor  Fund of  Knowledge: Poor  Language: Good   Psychomotor Activity  Psychomotor Activity: Psychomotor Activity: Restlessness   Assets  Assets: Resilience   Sleep  Sleep: Sleep: Fair    Physical Exam: Physical Exam Vitals reviewed.  Constitutional:      General: She is not in acute distress. Pulmonary:     Effort: Pulmonary effort is normal.  Neurological:     Mental Status: She is alert.     Motor: No weakness.     Gait: Gait normal.  Psychiatric:        Behavior: Behavior normal.        Judgment: Judgment normal.    Review of Systems  Constitutional:  Negative for chills and fever.  Cardiovascular:  Negative for chest pain and palpitations.  Neurological:  Negative for dizziness, tingling, tremors and headaches.  Psychiatric/Behavioral:  Negative for depression, hallucinations, memory loss, substance abuse and suicidal ideas. The patient is nervous/anxious. The patient does not have insomnia.   All other systems reviewed and are negative.  Blood pressure (!) 96/48, pulse 83, temperature 97.9 F (36.6 C), temperature source Oral, resp. rate 20, height 5\' 5"  (1.651 m), weight 88 kg, SpO2 99 %. Body mass index is 32.28 kg/m.   Treatment Plan Summary: Daily contact with patient to assess and evaluate symptoms and progress in treatment, Medication management, and Plan     Diagnoses Principal Problem:   Severe manic bipolar 1 disorder with psychotic behavior (HCC) Active Problems:   Hyperthyroidism   GAD (generalized anxiety disorder)   Insomnia   Plan:  Safety and Monitoring: Involuntary admission to inpatient psychiatric unit for safety, stabilization and treatment Daily contact with patient to assess and evaluate symptoms and progress in treatment Patient's case to be discussed in multi-disciplinary team meeting Observation Level : q15 minute checks Vital signs: q12 hours Precautions: Safety       Medications -Continue Risperdal 3 mg twice daily for  psychosis and mania -Decrease Thorazine from 50 mg tid to 25 mg tid - for psychosis and mania -Continue Depakote ER 1000 mg nightly for mania -valproic acid level, CBC, and CMP for tomorrow morning -Continue Ativan at decreased dose of 0.5 mg twice daily -Continue MiraLAX scheduled daily for constipation -Start Depoprovera 150 mg every 3 months for contraception  -Continue methimazole 10 mg daily for hypothyroidism  - Continue propranolol ER 60 mg for tachycardia, hypertension, and anxiety   - Will monitor heart rate and blood pressure -Continue hydrocortisone cream as needed to right outer ankle for itching and inflammation -Continue trazodone 50 mg nightly as needed for sleep -Continue hydroxyzine 25 mg 3 times daily as needed for anxiety -Continue Ativan 1 mg BID for GAD (home med)   PRNs - Tylenol 650 mg every 6 hours PRN for mild pain - Maalox 30 mg every 4 hrs PRN for indigestion - Milk of Magnesia as needed every 6 hrs for constipation -Trazodone 50 mg nightly as needed for sleep - Hydroxyzine 25 mg 3 times daily as needed for anxiety   Discharge Planning: Social work and case management to assist with discharge planning and identification of Perez follow-up needs prior to discharge Estimated LOS: 3-4 more days Discharge Concerns: Need to establish a safety plan; Medication compliance and effectiveness Discharge Goals: Return home with outpatient referrals for mental health follow-up including medication management/psychotherapy     Cristy Hilts, MD 07/07/2022, 1:45 PM  Total Time  Spent in Direct Patient Care:  I personally spent 35 minutes on the unit in direct patient care. The direct patient care time included face-to-face time with the patient, reviewing the patient's chart, communicating with other professionals, and coordinating care. Greater than 50% of this time was spent in counseling or coordinating care with the patient regarding goals of hospitalization,  psycho-education, and discharge planning needs.   Phineas Inches, MD Psychiatrist

## 2022-07-07 NOTE — Progress Notes (Addendum)
With the patient's permission, I called the patient's fianc David [place] at [phone]: [name] did not answer   I called again at 2:42 pm, and [name] answered. He says that the patient has psychiatric improvement over the last few days. He does say that the street traffic noisy and that loud cars bother him and her. He also says that neighbor does bother the patient.  We discussed the pt's diagnosis, hospital course, treatment, response to treatment, and discharge planning. At the end of the call, the caller had no further questions.

## 2022-07-07 NOTE — Group Note (Signed)
LCSW Group Therapy Note  Group Date: 07/07/2022 Start Time: 1300 End Time: 1400   Type of Therapy and Topic:  Group Therapy - How To Cope with Nervousness about Discharge   Participation Level:  Active   Description of Group This process group involved identification of patients' feelings about discharge. Some of them are scheduled to be discharged soon, while others are new admissions, but each of them was asked to share thoughts and feelings surrounding discharge from the hospital. One common theme was that they are excited at the prospect of going home, while another was that many of them are apprehensive about sharing why they were hospitalized. Patients were given the opportunity to discuss these feelings with their peers in preparation for discharge.  Therapeutic Goals  Patient will identify their overall feelings about pending discharge. Patient will think about how they might proactively address issues that they believe will once again arise once they get home (i.e. with parents). Patients will participate in discussion about having hope for change.   Summary of Patient Progress:  Veronica Perez was very active throughout the session. She demonstrated good insight into the subject matter, and proved open to input from peers and feedback from CSW. She was respectful of peers and participated throughout the entire session.   Therapeutic Modalities Cognitive Behavioral Therapy   Beatris Si, LCSW 07/07/2022  2:03 PM

## 2022-07-07 NOTE — Plan of Care (Signed)
  Problem: Education: Goal: Emotional status will improve Outcome: Progressing Goal: Mental status will improve Outcome: Progressing   Problem: Coping: Goal: Ability to demonstrate self-control will improve Outcome: Progressing   Problem: Health Behavior/Discharge Planning: Goal: Compliance with treatment plan for underlying cause of condition will improve Outcome: Progressing   

## 2022-07-07 NOTE — Group Note (Signed)
Date:  07/07/2022 Time:  9:32 AM  Group Topic/Focus:  Goals Group:   The focus of this group is to help patients establish daily goals to achieve during treatment and discuss how the patient can incorporate goal setting into their daily lives to aide in recovery.    Participation Level:  Did Not Attend    Donell Beers 07/07/2022, 9:32 AM

## 2022-07-08 LAB — CBC
HCT: 34.4 % — ABNORMAL LOW (ref 36.0–46.0)
Hemoglobin: 11.1 g/dL — ABNORMAL LOW (ref 12.0–15.0)
MCH: 25.9 pg — ABNORMAL LOW (ref 26.0–34.0)
MCHC: 32.3 g/dL (ref 30.0–36.0)
MCV: 80.4 fL (ref 80.0–100.0)
Platelets: 290 10*3/uL (ref 150–400)
RBC: 4.28 MIL/uL (ref 3.87–5.11)
RDW: 13.6 % (ref 11.5–15.5)
WBC: 10.6 10*3/uL — ABNORMAL HIGH (ref 4.0–10.5)
nRBC: 0 % (ref 0.0–0.2)

## 2022-07-08 LAB — COMPREHENSIVE METABOLIC PANEL
ALT: 16 U/L (ref 0–44)
AST: 12 U/L — ABNORMAL LOW (ref 15–41)
Albumin: 2.9 g/dL — ABNORMAL LOW (ref 3.5–5.0)
Alkaline Phosphatase: 50 U/L (ref 38–126)
Anion gap: 7 (ref 5–15)
BUN: 11 mg/dL (ref 6–20)
CO2: 22 mmol/L (ref 22–32)
Calcium: 8.2 mg/dL — ABNORMAL LOW (ref 8.9–10.3)
Chloride: 106 mmol/L (ref 98–111)
Creatinine, Ser: 0.58 mg/dL (ref 0.44–1.00)
GFR, Estimated: >60 mL/min (ref >60)
Glucose, Bld: 108 mg/dL — ABNORMAL HIGH (ref 70–99)
Potassium: 3.7 mmol/L (ref 3.5–5.1)
Sodium: 135 mmol/L (ref 135–145)
Total Bilirubin: 0.5 mg/dL (ref 0.3–1.2)
Total Protein: 6 g/dL — ABNORMAL LOW (ref 6.5–8.1)

## 2022-07-08 LAB — VALPROIC ACID LEVEL: Valproic Acid Lvl: 38 ug/mL — ABNORMAL LOW (ref 50.0–100.0)

## 2022-07-08 MED ORDER — DIVALPROEX SODIUM ER 500 MG PO TB24
1500.0000 mg | ORAL_TABLET | Freq: Every day | ORAL | Status: DC
  Administered 2022-07-08 – 2022-07-09 (×2): 1500 mg via ORAL
  Filled 2022-07-08 (×4): qty 3

## 2022-07-08 NOTE — Discharge Instructions (Signed)
You received Depoprovera q3 months contraception injection on 07-07-2022. Your next dose is due on 10-05-22.  If you choose to not ocntinue with contraception, you should talk with your PCP, OBGYN, and psychiatrist; and you MUST stop taking your depakote at least 1 month in advance.

## 2022-07-08 NOTE — Care Management Important Message (Signed)
Medicare IM printed and given to [facility] to give to the patient.

## 2022-07-08 NOTE — Progress Notes (Signed)
   07/08/22 4098  15 Minute Checks  Location Bedroom  Visual Appearance Calm  Behavior Composed  Sleep (Behavioral Health Patients Only)  Calculate sleep? (Click Yes once per 24 hr at 0600 safety check) Yes  Documented sleep last 24 hours 8.5

## 2022-07-08 NOTE — Plan of Care (Signed)
  Problem: Education: Goal: Emotional status will improve Outcome: Progressing Goal: Mental status will improve Outcome: Progressing   Problem: Activity: Goal: Interest or engagement in activities will improve Outcome: Progressing   Problem: Coping: Goal: Ability to demonstrate self-control will improve Outcome: Progressing   

## 2022-07-08 NOTE — Progress Notes (Signed)
   07/08/22 1940  Psych Admission Type (Psych Patients Only)  Admission Status Involuntary  Psychosocial Assessment  Patient Complaints None  Eye Contact Fair  Facial Expression Flat  Affect Appropriate to circumstance  Speech Logical/coherent  Interaction Assertive  Motor Activity Fidgety  Appearance/Hygiene Unremarkable  Behavior Characteristics Cooperative  Mood Pleasant  Thought Process  Coherency Circumstantial  Content WDL  Delusions None reported or observed  Perception WDL  Hallucination None reported or observed  Judgment Poor  Confusion None  Danger to Self  Current suicidal ideation? Denies  Self-Injurious Behavior No self-injurious ideation or behavior indicators observed or expressed   Agreement Not to Harm Self Yes  Description of Agreement Verbal  Danger to Others  Danger to Others None reported or observed

## 2022-07-08 NOTE — Progress Notes (Signed)
[facility] MD Progress Note  07/08/2022 3:05 PM Veronica Perez  MRN:  [id number]  Subjective:   Veronica Perez is a 47 year old Caucasian female with past psychiatric history of bipolar 1 disorder who presented to the [facility] on 06/23/22 with disorganized and pressured speech, disoriented unable to state how or reason for presenting to ED. Per collateral information obtained by the ED provider from pt's family, patient had not been adequately caring for herself, experiencing insomnia, disorientation, and repeated calls to law enforcement reporting she was in danger. Per EDP notes, patient's family reported not knowing how the patient got to the ER. She was IVC'd in ED for concern of her being a danger to self and others related to her altered mental status. She was assessed and recommended for inpatient hospitalization; transferred to [facility] on 05/21 for further stabilization and treatment.   On my assessment today, patient has more linear thinking, she is less tangential, no appreciable flight of ideas. She reports less daytime fatigue, less dry mouth since decreasing thorazine dosage. She is less fixated on delusions. Does not complain of neighbor today.  She reports that sleep at night is good.  At this time she denies any AH or VH.  Denies any SI or HI.  Reports that mood is anxious.  Reports that appetite is okay.  Concentration is better.  We discussed her low depakote level and plan to increase depakote and pt is agreeable.        Principal Problem: Severe manic bipolar 1 disorder with psychotic behavior (HCC) Diagnosis: Principal Problem:   Severe manic bipolar 1 disorder with psychotic behavior (HCC) Active Problems:   Hyperthyroidism   GAD (generalized anxiety disorder)   Insomnia  Total Time spent with patient: 20 minutes  Past Psychiatric History: See H&P the  Past Medical History:  Past Medical History:  Diagnosis Date   Bipolar disorder (HCC)    Depression    Former smoker, stopped  smoking many years ago    HSV (herpes simplex virus) infection    Hypothyroid    Low TSH level 05/30/2016   Panic attacks    Thyroid disease     Past Surgical History:  Procedure Laterality Date   ADENOIDECTOMY     OVARIAN CYST SURGERY     TONSILLECTOMY     Family History:  Family History  Problem Relation Age of Onset   Diabetes Father    Heart disease Father    Cancer Maternal Grandmother        breast cancer   Bipolar disorder Mother    Thyroid disease Neg Hx    Family Psychiatric  History: See H&P Social History:  Social History   Substance and Sexual Activity  Alcohol Use Not Currently   Comment: denies     Social History   Substance and Sexual Activity  Drug Use No    Social History   Socioeconomic History   Marital status: Single    Spouse name: Not on file   Number of children: 1   Years of education: Not on file   Highest education level: High school graduate  Occupational History   Not on file  Tobacco Use   Smoking status: Every Day    Packs/day: .25    Types: Cigarettes   Smokeless tobacco: Never  Vaping Use   Vaping Use: Never used  Substance and Sexual Activity   Alcohol use: Not Currently    Comment: denies   Drug use: No   Sexual activity:  Yes    Birth control/protection: None  Other Topics Concern   Not on file  Social History Narrative   Not on file   Social Determinants of Health   Financial Resource Strain: Not on file  Food Insecurity: Patient Declined (06/24/2022)   Hunger Vital Sign    Worried About Running Out of Food in the Last Year: Patient declined    Ran Out of Food in the Last Year: Patient declined  Transportation Needs: Patient Declined (06/24/2022)   PRAPARE - Administrator, Civil Service (Medical): Patient declined    Lack of Transportation (Non-Medical): Patient declined  Physical Activity: Not on file  Stress: Not on file  Social Connections: Not on file   Additional Social History:                            Current Medications: Current Facility-Administered Medications  Medication Dose Route Frequency Provider Last Rate Last Admin   acetaminophen (TYLENOL) tablet 650 mg  650 mg Oral Q6H PRN Eligha Bridegroom, NP   650 mg at 06/28/22 0617   alum & mag hydroxide-simeth (MAALOX/MYLANTA) 200-200-20 MG/5ML suspension 30 mL  30 mL Oral Q4H PRN Eligha Bridegroom, NP       benztropine (COGENTIN) tablet 1 mg  1 mg Oral BID PRN Starleen Blue, NP       Or   benztropine mesylate (COGENTIN) injection 1 mg  1 mg Intramuscular BID PRN Starleen Blue, NP       chlorproMAZINE (THORAZINE) tablet 50 mg  50 mg Oral TID PRN Phineas Inches, MD       Or   chlorproMAZINE (THORAZINE) injection 50 mg  50 mg Intramuscular TID PRN Tarren Sabree, Harrold Donath, MD       chlorproMAZINE (THORAZINE) tablet 25 mg  25 mg Oral Q8H Aliayah Tyer, Harrold Donath, MD   25 mg at 07/08/22 1320   diphenhydrAMINE (BENADRYL) capsule 50 mg  50 mg Oral TID PRN Eligha Bridegroom, NP       Or   diphenhydrAMINE (BENADRYL) injection 50 mg  50 mg Intramuscular TID PRN Eligha Bridegroom, NP       divalproex (DEPAKOTE ER) 24 hr tablet 1,500 mg  1,500 mg Oral QHS Braheem Tomasik, MD       fluticasone (FLONASE) 50 MCG/ACT nasal spray 1 spray  1 spray Each Nare Daily Carlyn Reichert, MD   1 spray at 07/07/22 0815   hydrocortisone cream 1 %   Topical TID Phineas Inches, MD   Given at 07/08/22 0830   hydrOXYzine (ATARAX) tablet 25 mg  25 mg Oral TID PRN Phineas Inches, MD       LORazepam (ATIVAN) tablet 1 mg  1 mg Oral TID PRN Eligha Bridegroom, NP       Or   LORazepam (ATIVAN) injection 1 mg  1 mg Intramuscular TID PRN Eligha Bridegroom, NP       LORazepam (ATIVAN) tablet 0.5 mg  0.5 mg Oral BID Jalien Weakland, MD   0.5 mg at 07/08/22 0831   magnesium hydroxide (MILK OF MAGNESIA) suspension 30 mL  30 mL Oral Daily PRN Eligha Bridegroom, NP   30 mL at 07/05/22 1957   methimazole (TAPAZOLE) tablet 10 mg  10 mg Oral Daily  Loretha Ure, Harrold Donath, MD   10 mg at 07/08/22 0830   polyethylene glycol (MIRALAX / GLYCOLAX) packet 17 g  17 g Oral Daily Phineas Inches, MD   17 g at 07/08/22 0829   propranolol  ER (INDERAL LA) 24 hr capsule 60 mg  60 mg Oral Daily Pavielle Biggar, Harrold Donath, MD   60 mg at 07/08/22 0830   risperiDONE (RISPERDAL M-TABS) disintegrating tablet 3 mg  3 mg Oral Q12H Merlyn Bollen, Harrold Donath, MD   3 mg at 07/08/22 2440   sodium chloride (OCEAN) 0.65 % nasal spray 1 spray  1 spray Each Nare PRN Carlyn Reichert, MD       traZODone (DESYREL) tablet 50 mg  50 mg Oral QHS PRN Phineas Inches, MD        Lab Results:  Results for orders placed or performed during the hospital encounter of 06/24/22 (from the past 48 hour(s))  CBC     Status: Abnormal   Collection Time: 07/08/22  6:43 AM  Result Value Ref Range   WBC 10.6 (H) 4.0 - 10.5 K/uL   RBC 4.28 3.87 - 5.11 MIL/uL   Hemoglobin 11.1 (L) 12.0 - 15.0 g/dL   HCT 10.2 (L) 72.5 - 36.6 %   MCV 80.4 80.0 - 100.0 fL   MCH 25.9 (L) 26.0 - 34.0 pg   MCHC 32.3 30.0 - 36.0 g/dL   RDW 44.0 34.7 - 42.5 %   Platelets 290 150 - 400 K/uL   nRBC 0.0 0.0 - 0.2 %    Comment: Performed at [facility], [address], [place], [place] [address]  Valproic acid level     Status: Abnormal   Collection Time: 07/08/22  6:43 AM  Result Value Ref Range   Valproic Acid Lvl 38 (L) 50.0 - 100.0 ug/mL    Comment: Performed at [facility], [address] [place], [place] [address]  Comprehensive metabolic panel     Status: Abnormal   Collection Time: 07/08/22  6:43 AM  Result Value Ref Range   Sodium 135 135 - 145 mmol/L   Potassium 3.7 3.5 - 5.1 mmol/L   Chloride 106 98 - 111 mmol/L   CO2 22 22 - 32 mmol/L   Glucose, Bld 108 (H) 70 - 99 mg/dL    Comment: Glucose reference range applies only to samples taken after fasting for at least 8 hours.   BUN 11 6 - 20 mg/dL   Creatinine, Ser 3.29 0.44 - 1.00 mg/dL   Calcium 8.2 (L) 8.9 - 10.3  mg/dL   Total Protein 6.0 (L) 6.5 - 8.1 g/dL   Albumin 2.9 (L) 3.5 - 5.0 g/dL   AST 12 (L) 15 - 41 U/L   ALT 16 0 - 44 U/L   Alkaline Phosphatase 50 38 - 126 U/L   Total Bilirubin 0.5 0.3 - 1.2 mg/dL   GFR, Estimated >51 >88 mL/min    Comment: (NOTE) Calculated using the CKD-EPI Creatinine Equation (2021)    Anion gap 7 5 - 15    Comment: Performed at [facility], [address] [place], [place] [address]    Blood Alcohol level:  Lab Results  Component Value Date   [facility] <10 06/23/2022   ETH <10 11/25/2020    Metabolic Disorder Labs: Lab Results  Component Value Date   HGBA1C 5.2 06/27/2022   MPG 103 06/27/2022   MPG 96.8 11/28/2020   Lab Results  Component Value Date   PROLACTIN 13.1 06/28/2019   PROLACTIN 69.3 (H) 09/17/2016   Lab Results  Component Value Date   CHOL 122 06/27/2022   TRIG 162 (H) 06/27/2022   HDL 31 (L) 06/27/2022   CHOLHDL 3.9 06/27/2022   VLDL 32  06/27/2022   LDLCALC 59 06/27/2022   LDLCALC 58 11/28/2020    Physical Findings: AIMS: Facial and Oral Movements Muscles of Facial Expression: None, normal Lips and Perioral Area: None, normal Jaw: None, normal Tongue: None, normal,Extremity Movements Upper (arms, wrists, hands, fingers): None, normal Lower (legs, knees, ankles, toes): None, normal, Trunk Movements Neck, shoulders, hips: None, normal, Overall Severity Severity of abnormal movements (highest score from questions above): None, normal Incapacitation due to abnormal movements: None, normal Patient's awareness of abnormal movements (rate only patient's report): No Awareness, Dental Status Current problems with teeth and/or dentures?: No Does patient usually wear dentures?: No  CIWA:    COWS:     No EPS on my exam today  Musculoskeletal: Strength & Muscle Tone: within normal limits Gait & Station: normal Patient leans: N/A  Psychiatric Specialty Exam:  Presentation  General Appearance:  Casual; Fairly  Groomed  Eye Contact: Good  Speech: Normal Rate  Speech Volume: Normal  Handedness: Right   Mood and Affect  Mood: Anxious  Affect: Full Range   Thought Process  Thought Processes: Linear  Descriptions of Associations:Intact  Orientation:Full (Time, Place and Person)  Thought Content:Illogical  History of Schizophrenia/Schizoaffective disorder:Yes  Duration of Psychotic Symptoms:Less than six months  Hallucinations:Hallucinations: None  Ideas of Reference:Delusions; Paranoia  Suicidal Thoughts:Suicidal Thoughts: No  Homicidal Thoughts:Homicidal Thoughts: No   Sensorium  Memory: Immediate Fair; Recent Fair; Remote Fair  Judgment: Impaired  Insight: Lacking   Executive Functions  Concentration: Fair  Attention Span: Fair  Recall: Poor  Fund of Knowledge: Poor  Language: Good   Psychomotor Activity  Psychomotor Activity: Psychomotor Activity: Restlessness   Assets  Assets: Resilience   Sleep  Sleep: Sleep: Fair    Physical Exam: Physical Exam Vitals reviewed.  Constitutional:      General: She is not in acute distress. Pulmonary:     Effort: Pulmonary effort is normal.  Neurological:     Mental Status: She is alert.     Motor: No weakness.     Gait: Gait normal.  Psychiatric:        Behavior: Behavior normal.        Judgment: Judgment normal.    Review of Systems  Constitutional:  Negative for chills and fever.  Cardiovascular:  Negative for chest pain and palpitations.  Neurological:  Negative for dizziness, tingling, tremors and headaches.  Psychiatric/Behavioral:  Negative for depression, hallucinations, memory loss, substance abuse and suicidal ideas. The patient is nervous/anxious. The patient does not have insomnia.   All other systems reviewed and are negative.  Blood pressure 96/61, pulse 88, temperature 97.9 F (36.6 C), temperature source Oral, resp. rate 17, height 5\' 5"  (1.651 m), weight 88 kg,  SpO2 97 %. Body mass index is 32.28 kg/m.   Treatment Plan Summary: Daily contact with patient to assess and evaluate symptoms and progress in treatment, Medication management, and Plan     Diagnoses Principal Problem:   Severe manic bipolar 1 disorder with psychotic behavior (HCC) Active Problems:   Hyperthyroidism   GAD (generalized anxiety disorder)   Insomnia   Plan:  Safety and Monitoring: Involuntary admission to inpatient psychiatric unit for safety, stabilization and treatment Daily contact with patient to assess and evaluate symptoms and progress in treatment Patient's case to be discussed in multi-disciplinary team meeting Observation Level : q15 minute checks Vital signs: q12 hours Precautions: Safety       Medications -Continue Risperdal 3 mg twice daily for psychosis and mania. Pt declines  LAI.  -Continue Thorazine 25 mg tid - for psychosis and mania -Increase Depakote from ER 1000 mg to ER 1500 mg nightly for mania - VA level 38  -Continue Ativan at decreased dose of 0.5 mg twice daily -Continue MiraLAX scheduled daily for constipation -Previously administered on 07-07-2022 Depoprovera 150 mg every 3 months for contraception  -Continue methimazole 10 mg daily for hypothyroidism  - Continue propranolol ER 60 mg for tachycardia, hypertension, and anxiety   - Will monitor heart rate and blood pressure -Continue hydrocortisone cream as needed to right outer ankle for itching and inflammation -Continue trazodone 50 mg nightly as needed for sleep -Continue hydroxyzine 25 mg 3 times daily as needed for anxiety -Continue Ativan 1 mg BID for GAD (home med)   PRNs - Tylenol 650 mg every 6 hours PRN for mild pain - Maalox 30 mg every 4 hrs PRN for indigestion - Milk of Magnesia as needed every 6 hrs for constipation -Trazodone 50 mg nightly as needed for sleep - Hydroxyzine 25 mg 3 times daily as needed for anxiety   Discharge Planning: Social work and case  management to assist with discharge planning and identification of hospital follow-up needs prior to discharge Estimated LOS: 3-4 more days Discharge Concerns: Need to establish a safety plan; Medication compliance and effectiveness Discharge Goals: Return home with outpatient referrals for mental health follow-up including medication management/psychotherapy     Cristy Hilts, MD 07/08/2022, 3:05 PM  Total Time Spent in Direct Patient Care:  I personally spent 35 minutes on the unit in direct patient care. The direct patient care time included face-to-face time with the patient, reviewing the patient's chart, communicating with other professionals, and coordinating care. Greater than 50% of this time was spent in counseling or coordinating care with the patient regarding goals of hospitalization, psycho-education, and discharge planning needs.   Phineas Inches, MD Psychiatrist

## 2022-07-08 NOTE — Progress Notes (Signed)
Adult Psychoeducational Group Note  Date:  07/08/2022 Time:  8:31 PM  Group Topic/Focus:  Wrap-Up Group:   The focus of this group is to help patients review their daily goal of treatment and discuss progress on daily workbooks.  Participation Level:  Active  Participation Quality:  Appropriate  Affect:  Appropriate  Cognitive:  Appropriate  Insight: Appropriate  Engagement in Group:  Engaged  Modes of Intervention:  Discussion and Support  Additional Comments:  Pt states goal today, was to discuss things with doctor. Pt states enjoying food today. Pt rates day an 8/10. Pt states feeling positive after talking to the doctor. Pt states looking forward to the food tomorrow.  Lyniah Fujita Katrinka Blazing 07/08/2022, 8:31 PM

## 2022-07-08 NOTE — Group Note (Signed)
Recreation Therapy Group Note   Group Topic:Health and Wellness  Group Date: 07/08/2022 Start Time: 1045 End Time: 1115 Facilitators: Caiya Bettes-McCall, LRT,CTRS Location: [address] Day[address]   Goal Area(s) Addresses:  Patient will identify benefits of exercise. Patient will identify a place in the community to go and exercise.  Patient will identify what they like to do for exercise.   Group Description: Group was started with sttretching and discussing exercise, the benefits of exercise and where to exercise. Next the patient led the group in the exercises of their choosing.  Patients were to complete at least 30 minutes of exercise.  Patients were to get water or take breaks as needed. Patient(s) and LRT debriefed on what feelings and emotions they were feeling at completion of the activity.    Affect/Mood: N/A   Participation Level: Did not attend    Clinical Observations/Individualized Feedback:     Plan: Continue to engage patient in RT group sessions 2-3x/week.   Veronica Perez, LRT,CTRS 07/08/2022 1:15 PM

## 2022-07-08 NOTE — Progress Notes (Signed)
   07/08/22 1100  Psych Admission Type (Psych Patients Only)  Admission Status Involuntary  Psychosocial Assessment  Patient Complaints None  Eye Contact Brief  Facial Expression Flat  Affect Appropriate to circumstance  Speech Logical/coherent  Interaction Assertive  Motor Activity Restless  Appearance/Hygiene Unremarkable  Behavior Characteristics Cooperative  Mood Anxious  Thought Process  Coherency Circumstantial  Content WDL  Delusions None reported or observed  Perception WDL  Hallucination None reported or observed  Judgment Poor  Confusion None  Danger to Self  Current suicidal ideation? Denies  Danger to Others  Danger to Others None reported or observed

## 2022-07-09 ENCOUNTER — Encounter (HOSPITAL_COMMUNITY): Payer: Self-pay

## 2022-07-09 MED ORDER — CHLORPROMAZINE HCL 10 MG PO TABS
10.0000 mg | ORAL_TABLET | Freq: Three times a day (TID) | ORAL | Status: DC
  Administered 2022-07-09 – 2022-07-10 (×3): 10 mg via ORAL
  Filled 2022-07-09 (×10): qty 1

## 2022-07-09 NOTE — Progress Notes (Signed)
   07/09/22 1130  Psych Admission Type (Psych Patients Only)  Admission Status Involuntary  Psychosocial Assessment  Patient Complaints Other (Comment) ("My medicines making me sleepy)  Eye Contact Fair  Facial Expression Flat  Affect Appropriate to circumstance  Speech Logical/coherent  Interaction Assertive  Motor Activity Fidgety;Restless  Appearance/Hygiene Unremarkable  Behavior Characteristics Cooperative  Mood Anxious;Preoccupied  Aggressive Behavior  Effect No apparent injury  Thought Process  Coherency Circumstantial  Content WDL  Delusions None reported or observed  Perception WDL  Hallucination None reported or observed  Judgment Poor  Confusion None  Danger to Self  Current suicidal ideation? Denies  Self-Injurious Behavior No self-injurious ideation or behavior indicators observed or expressed   Agreement Not to Harm Self Yes  Description of Agreement Verbal contract  Danger to Others  Danger to Others None reported or observed

## 2022-07-09 NOTE — Plan of Care (Signed)
°  Problem: Education: °Goal: Emotional status will improve °Outcome: Progressing °Goal: Mental status will improve °Outcome: Progressing °Goal: Verbalization of understanding the information provided will improve °Outcome: Progressing °  °

## 2022-07-09 NOTE — Progress Notes (Signed)
[facility] MD Progress Note  07/09/2022 3:37 PM BOZENA SILVERIA  MRN:  [id number]  Subjective:   Veronica Perez is a 47 year old Caucasian female with past psychiatric history of bipolar 1 disorder who presented to the [facility] on 06/23/22 with disorganized and pressured speech, disoriented unable to state how or reason for presenting to ED. Per collateral information obtained by the ED provider from pt's family, patient had not been adequately caring for herself, experiencing insomnia, disorientation, and repeated calls to law enforcement reporting she was in danger. Per EDP notes, patient's family reported not knowing how the patient got to the ER. She was IVC'd in ED for concern of her being a danger to self and others related to her altered mental status. She was assessed and recommended for inpatient hospitalization; transferred to [facility] on 05/21 for further stabilization and treatment.   On my assessment today, patient has more linear thinking, she is less tangential, no appreciable flight of ideas. She reports less daytime fatigue, less dry mouth since decreasing thorazine dosage. She does not spontaneously voice any delusions. Reports speaking with daughter who states she is ready to come home.  Does not complain of neighbor today.  She reports that sleep at night is good.  At this time she denies any AH or VH.  Denies any SI or HI.  Reports that mood is anxious.  Reports that appetite is okay.  Concentration is better.    We discussed getting a new valproic acid level tomorrow, and pt agrees for this writer to contact her daughter to discuss dc planning and if pt has returned to baseline.   Pt declined LAI again today.    Principal Problem: Severe manic bipolar 1 disorder with psychotic behavior (HCC) Diagnosis: Principal Problem:   Severe manic bipolar 1 disorder with psychotic behavior (HCC) Active Problems:   Hyperthyroidism   GAD (generalized anxiety disorder)   Insomnia  Total Time spent  with patient: 20 minutes  Past Psychiatric History: See H&P the  Past Medical History:  Past Medical History:  Diagnosis Date   Bipolar disorder (HCC)    Depression    Former smoker, stopped smoking many years ago    HSV (herpes simplex virus) infection    Hypothyroid    Low TSH level 05/30/2016   Panic attacks    Thyroid disease     Past Surgical History:  Procedure Laterality Date   ADENOIDECTOMY     OVARIAN CYST SURGERY     TONSILLECTOMY     Family History:  Family History  Problem Relation Age of Onset   Diabetes Father    Heart disease Father    Cancer Maternal Grandmother        breast cancer   Bipolar disorder Mother    Thyroid disease Neg Hx    Family Psychiatric  History: See H&P  Social History:  Social History   Substance and Sexual Activity  Alcohol Use Not Currently   Comment: denies     Social History   Substance and Sexual Activity  Drug Use No    Social History   Socioeconomic History   Marital status: Single    Spouse name: Not on file   Number of children: 1   Years of education: Not on file   Highest education level: High school graduate  Occupational History   Not on file  Tobacco Use   Smoking status: Every Day    Packs/day: .25    Types: Cigarettes   Smokeless tobacco:  Never  Vaping Use   Vaping Use: Never used  Substance and Sexual Activity   Alcohol use: Not Currently    Comment: denies   Drug use: No   Sexual activity: Yes    Birth control/protection: None  Other Topics Concern   Not on file  Social History Narrative   Not on file   Social Determinants of Health   Financial Resource Strain: Not on file  Food Insecurity: Patient Declined (06/24/2022)   Hunger Vital Sign    Worried About Running Out of Food in the Last Year: Patient declined    Ran Out of Food in the Last Year: Patient declined  Transportation Needs: Patient Declined (06/24/2022)   PRAPARE - Administrator, Civil Service (Medical):  Patient declined    Lack of Transportation (Non-Medical): Patient declined  Physical Activity: Not on file  Stress: Not on file  Social Connections: Not on file   Additional Social History:                           Current Medications: Current Facility-Administered Medications  Medication Dose Route Frequency Provider Last Rate Last Admin   acetaminophen (TYLENOL) tablet 650 mg  650 mg Oral Q6H PRN Eligha Bridegroom, NP   650 mg at 06/28/22 0617   alum & mag hydroxide-simeth (MAALOX/MYLANTA) 200-200-20 MG/5ML suspension 30 mL  30 mL Oral Q4H PRN Eligha Bridegroom, NP       benztropine (COGENTIN) tablet 1 mg  1 mg Oral BID PRN Starleen Blue, NP       Or   benztropine mesylate (COGENTIN) injection 1 mg  1 mg Intramuscular BID PRN Starleen Blue, NP       chlorproMAZINE (THORAZINE) tablet 50 mg  50 mg Oral TID PRN Zionah Criswell, Harrold Donath, MD       Or   chlorproMAZINE (THORAZINE) injection 50 mg  50 mg Intramuscular TID PRN Snyder Colavito, Harrold Donath, MD       chlorproMAZINE (THORAZINE) tablet 10 mg  10 mg Oral Q8H Gwenda Heiner, MD       diphenhydrAMINE (BENADRYL) capsule 50 mg  50 mg Oral TID PRN Eligha Bridegroom, NP       Or   diphenhydrAMINE (BENADRYL) injection 50 mg  50 mg Intramuscular TID PRN Eligha Bridegroom, NP       divalproex (DEPAKOTE ER) 24 hr tablet 1,500 mg  1,500 mg Oral QHS Sanaiyah Kirchhoff, MD   1,500 mg at 07/08/22 2029   fluticasone (FLONASE) 50 MCG/ACT nasal spray 1 spray  1 spray Each Nare Daily Carlyn Reichert, MD   1 spray at 07/07/22 0815   hydrocortisone cream 1 %   Topical TID Phineas Inches, MD   Given at 07/08/22 0830   hydrOXYzine (ATARAX) tablet 25 mg  25 mg Oral TID PRN Phineas Inches, MD       LORazepam (ATIVAN) tablet 1 mg  1 mg Oral TID PRN Eligha Bridegroom, NP       Or   LORazepam (ATIVAN) injection 1 mg  1 mg Intramuscular TID PRN Eligha Bridegroom, NP       LORazepam (ATIVAN) tablet 0.5 mg  0.5 mg Oral BID Paige Vanderwoude, MD   0.5 mg  at 07/09/22 0748   magnesium hydroxide (MILK OF MAGNESIA) suspension 30 mL  30 mL Oral Daily PRN Eligha Bridegroom, NP   30 mL at 07/05/22 1957   methimazole (TAPAZOLE) tablet 10 mg  10 mg Oral Daily Phineas Inches, MD  10 mg at 07/09/22 0750   polyethylene glycol (MIRALAX / GLYCOLAX) packet 17 g  17 g Oral Daily Shaletha Humble, Harrold Donath, MD   17 g at 07/08/22 0829   propranolol ER (INDERAL LA) 24 hr capsule 60 mg  60 mg Oral Daily Juquan Reznick, Harrold Donath, MD   60 mg at 07/09/22 0948   risperiDONE (RISPERDAL M-TABS) disintegrating tablet 3 mg  3 mg Oral Q12H Wilton Thrall, Harrold Donath, MD   3 mg at 07/09/22 0749   sodium chloride (OCEAN) 0.65 % nasal spray 1 spray  1 spray Each Nare PRN Carlyn Reichert, MD       traZODone (DESYREL) tablet 50 mg  50 mg Oral QHS PRN Phineas Inches, MD        Lab Results:  Results for orders placed or performed during the hospital encounter of 06/24/22 (from the past 48 hour(s))  CBC     Status: Abnormal   Collection Time: 07/08/22  6:43 AM  Result Value Ref Range   WBC 10.6 (H) 4.0 - 10.5 K/uL   RBC 4.28 3.87 - 5.11 MIL/uL   Hemoglobin 11.1 (L) 12.0 - 15.0 g/dL   HCT 62.1 (L) 30.8 - 65.7 %   MCV 80.4 80.0 - 100.0 fL   MCH 25.9 (L) 26.0 - 34.0 pg   MCHC 32.3 30.0 - 36.0 g/dL   RDW 84.6 96.2 - 95.2 %   Platelets 290 150 - 400 K/uL   nRBC 0.0 0.0 - 0.2 %    Comment: Performed at [facility], [address] [place], [place] [address]  Valproic acid level     Status: Abnormal   Collection Time: 07/08/22  6:43 AM  Result Value Ref Range   Valproic Acid Lvl 38 (L) 50.0 - 100.0 ug/mL    Comment: Performed at [facility], [address] [place], [place] [address]  Comprehensive metabolic panel     Status: Abnormal   Collection Time: 07/08/22  6:43 AM  Result Value Ref Range   Sodium 135 135 - 145 mmol/L   Potassium 3.7 3.5 - 5.1 mmol/L   Chloride 106 98 - 111 mmol/L   CO2 22 22 - 32 mmol/L   Glucose, Bld 108 (H) 70 - 99 mg/dL     Comment: Glucose reference range applies only to samples taken after fasting for at least 8 hours.   BUN 11 6 - 20 mg/dL   Creatinine, Ser 2.72 0.44 - 1.00 mg/dL   Calcium 8.2 (L) 8.9 - 10.3 mg/dL   Total Protein 6.0 (L) 6.5 - 8.1 g/dL   Albumin 2.9 (L) 3.5 - 5.0 g/dL   AST 12 (L) 15 - 41 U/L   ALT 16 0 - 44 U/L   Alkaline Phosphatase 50 38 - 126 U/L   Total Bilirubin 0.5 0.3 - 1.2 mg/dL   GFR, Estimated >53 >66 mL/min    Comment: (NOTE) Calculated using the CKD-EPI Creatinine Equation (2021)    Anion gap 7 5 - 15    Comment: Performed at [facility], [address], [place], [place] [address]    Blood Alcohol level:  Lab Results  Component Value Date   [facility] <10 06/23/2022   ETH <10 11/25/2020    Metabolic Disorder Labs: Lab Results  Component Value Date   HGBA1C 5.2 06/27/2022   MPG 103 06/27/2022   MPG 96.8 11/28/2020   Lab Results  Component Value Date   PROLACTIN 13.1 06/28/2019   PROLACTIN 69.3 (H) 09/17/2016  Lab Results  Component Value Date   CHOL 122 06/27/2022   TRIG 162 (H) 06/27/2022   HDL 31 (L) 06/27/2022   CHOLHDL 3.9 06/27/2022   VLDL 32 06/27/2022   LDLCALC 59 06/27/2022   LDLCALC 58 11/28/2020    Physical Findings: AIMS: Facial and Oral Movements Muscles of Facial Expression: None, normal Lips and Perioral Area: None, normal Jaw: None, normal Tongue: None, normal,Extremity Movements Upper (arms, wrists, hands, fingers): None, normal Lower (legs, knees, ankles, toes): Minimal (restless, marches in place at times), Trunk Movements Neck, shoulders, hips: None, normal, Overall Severity Severity of abnormal movements (highest score from questions above): Minimal Incapacitation due to abnormal movements: None, normal Patient's awareness of abnormal movements (rate only patient's report): No Awareness, Dental Status Current problems with teeth and/or dentures?: No Does patient usually wear dentures?: No  CIWA:    COWS:       Musculoskeletal: Strength & Muscle Tone: within normal limits Gait & Station: normal Patient leans: N/A  Psychiatric Specialty Exam:  Presentation  General Appearance:  Casual; Fairly Groomed  Eye Contact: Good  Speech: Normal Rate  Speech Volume: Normal  Handedness: Right   Mood and Affect  Mood: Anxious  Affect: Full Range   Thought Process  Thought Processes: Linear  Descriptions of Associations:Intact  Orientation:Full (Time, Place and Person)  Thought Content:Illogical  History of Schizophrenia/Schizoaffective disorder:Yes  Duration of Psychotic Symptoms:Less than six months  Hallucinations:No data recorded Denies AH, VH  Ideas of Reference:Delusions; Paranoia  Suicidal Thoughts:No data recorded Denies  Homicidal Thoughts:No data recorded Denies   Sensorium  Memory: Immediate Fair; Recent Fair; Remote Fair  Judgment: Impaired  Insight: Lacking   Executive Functions  Concentration: Fair  Attention Span: Fair  Recall: Poor  Fund of Knowledge: Poor  Language: Good   Psychomotor Activity  Psychomotor Activity: No data recorded nml  Assets  Assets: Resilience   Sleep  Sleep: No data recorded good   Physical Exam: Physical Exam Vitals reviewed.  Constitutional:      General: She is not in acute distress. Pulmonary:     Effort: Pulmonary effort is normal.  Neurological:     Mental Status: She is alert.     Motor: No weakness.     Gait: Gait normal.  Psychiatric:        Behavior: Behavior normal.        Judgment: Judgment normal.    Review of Systems  Constitutional:  Negative for chills and fever.  Cardiovascular:  Negative for chest pain and palpitations.  Neurological:  Negative for dizziness, tingling, tremors and headaches.  Psychiatric/Behavioral:  Negative for depression, hallucinations, memory loss, substance abuse and suicidal ideas. The patient is nervous/anxious. The patient does  not have insomnia.   All other systems reviewed and are negative.  Blood pressure (!) 118/58, pulse (!) 101, temperature 99 F (37.2 C), temperature source Oral, resp. rate 18, height 5\' 5"  (1.651 m), weight 88 kg, SpO2 98 %. Body mass index is 32.28 kg/m.   Treatment Plan Summary: Daily contact with patient to assess and evaluate symptoms and progress in treatment, Medication management, and Plan     Diagnoses Principal Problem:   Severe manic bipolar 1 disorder with psychotic behavior (HCC) Active Problems:   Hyperthyroidism   GAD (generalized anxiety disorder)   Insomnia   Plan:  Safety and Monitoring: Involuntary admission to inpatient psychiatric unit for safety, stabilization and treatment Daily contact with patient to assess and evaluate symptoms and progress in treatment Patient's  case to be discussed in multi-disciplinary team meeting Observation Level : q15 minute checks Vital signs: q12 hours Precautions: Safety       Medications -Continue Risperdal 3 mg twice daily for psychosis and mania. Pt declines LAI.  -Decrease Thorazine from 25 mg tid to 10 mg tid - for psychosis and mania -Continue Depakote from ER 1000 mg to ER 1500 mg nightly for mania - VA level 38  -Continue Ativan at decreased dose of 0.5 mg twice daily -Continue MiraLAX scheduled daily for constipation -Previously administered on 07-07-2022 Depoprovera 150 mg every 3 months for contraception  -Continue methimazole 10 mg daily for hypothyroidism  - Continue propranolol ER 60 mg for tachycardia, hypertension, and anxiety   - Will monitor heart rate and blood pressure -Continue hydrocortisone cream as needed to right outer ankle for itching and inflammation -Continue trazodone 50 mg nightly as needed for sleep -Continue hydroxyzine 25 mg 3 times daily as needed for anxiety -Continue Ativan 1 mg BID for GAD (home med)   PRNs - Tylenol 650 mg every 6 hours PRN for mild pain - Maalox 30 mg every 4  hrs PRN for indigestion - Milk of Magnesia as needed every 6 hrs for constipation -Trazodone 50 mg nightly as needed for sleep - Hydroxyzine 25 mg 3 times daily as needed for anxiety   Discharge Planning: Social work and case management to assist with discharge planning and identification of hospital follow-up needs prior to discharge Estimated LOS: 2-3 more days Discharge Concerns: Need to establish a safety plan; Medication compliance and effectiveness Discharge Goals: Return home with outpatient referrals for mental health follow-up including medication management/psychotherapy     Cristy Hilts, MD 07/09/2022, 3:37 PM  Total Time Spent in Direct Patient Care:  I personally spent 35 minutes on the unit in direct patient care. The direct patient care time included face-to-face time with the patient, reviewing the patient's chart, communicating with other professionals, and coordinating care. Greater than 50% of this time was spent in counseling or coordinating care with the patient regarding goals of hospitalization, psycho-education, and discharge planning needs.   Phineas Inches, MD Psychiatrist

## 2022-07-09 NOTE — Progress Notes (Signed)
The patient did not attend group.

## 2022-07-09 NOTE — Progress Notes (Signed)
Did not attend group 

## 2022-07-09 NOTE — [facility] Group Notes (Signed)
Adult Psychoeducational Group Note  Date:  07/09/2022 Time:  8:46 PM  Group Topic/Focus:  Wrap-Up Group:   The focus of this group is to help patients review their daily goal of treatment and discuss progress on daily workbooks.  Participation Level:  Active  Participation Quality:  Appropriate  Affect:  Appropriate  Cognitive:  Appropriate  Insight: Appropriate  Engagement in Group:  Engaged  Modes of Intervention:  Discussion and Support  Additional Comments:  Pt attended the evening group and responded to all discussion prompts from the Writer. Pt shared that today was a good day on the unit, the highlight of which was the food. "The beef tips were really, really good!" On the subject of staying well upon discharge, Pt spoke on the importance of self-care as it relates to her hygiene and appearance. Pt rated her day an 8 out of 10 and she appeared especially excited for her upcoming discharge.   Christ Kick 07/09/2022, 8:46 PM

## 2022-07-09 NOTE — BH IP Treatment Plan (Signed)
Interdisciplinary Treatment and Diagnostic Plan Update  07/09/2022 Time of Session: 9:35 AM ( UPDATE)  Veronica Perez MRN: [id number]  Principal Diagnosis: Severe manic bipolar 1 disorder with psychotic behavior (HCC)  Secondary Diagnoses: Principal Problem:   Severe manic bipolar 1 disorder with psychotic behavior (HCC) Active Problems:   Hyperthyroidism   GAD (generalized anxiety disorder)   Insomnia   Current Medications:  Current Facility-Administered Medications  Medication Dose Route Frequency Provider Last Rate Last Admin   acetaminophen (TYLENOL) tablet 650 mg  650 mg Oral Q6H PRN Eligha Bridegroom, NP   650 mg at 06/28/22 0617   alum & mag hydroxide-simeth (MAALOX/MYLANTA) 200-200-20 MG/5ML suspension 30 mL  30 mL Oral Q4H PRN Eligha Bridegroom, NP       benztropine (COGENTIN) tablet 1 mg  1 mg Oral BID PRN Starleen Blue, NP       Or   benztropine mesylate (COGENTIN) injection 1 mg  1 mg Intramuscular BID PRN Starleen Blue, NP       chlorproMAZINE (THORAZINE) tablet 50 mg  50 mg Oral TID PRN Phineas Inches, MD       Or   chlorproMAZINE (THORAZINE) injection 50 mg  50 mg Intramuscular TID PRN Massengill, Harrold Donath, MD       chlorproMAZINE (THORAZINE) tablet 10 mg  10 mg Oral Q8H Massengill, Nathan, MD       diphenhydrAMINE (BENADRYL) capsule 50 mg  50 mg Oral TID PRN Eligha Bridegroom, NP       Or   diphenhydrAMINE (BENADRYL) injection 50 mg  50 mg Intramuscular TID PRN Eligha Bridegroom, NP       divalproex (DEPAKOTE ER) 24 hr tablet 1,500 mg  1,500 mg Oral QHS Massengill, Nathan, MD   1,500 mg at 07/08/22 2029   fluticasone (FLONASE) 50 MCG/ACT nasal spray 1 spray  1 spray Each Nare Daily Carlyn Reichert, MD   1 spray at 07/07/22 0815   hydrocortisone cream 1 %   Topical TID Phineas Inches, MD   Given at 07/08/22 0830   hydrOXYzine (ATARAX) tablet 25 mg  25 mg Oral TID PRN Phineas Inches, MD       LORazepam (ATIVAN) tablet 1 mg  1 mg Oral TID PRN Eligha Bridegroom,  NP       Or   LORazepam (ATIVAN) injection 1 mg  1 mg Intramuscular TID PRN Eligha Bridegroom, NP       LORazepam (ATIVAN) tablet 0.5 mg  0.5 mg Oral BID Massengill, Nathan, MD   0.5 mg at 07/09/22 0748   magnesium hydroxide (MILK OF MAGNESIA) suspension 30 mL  30 mL Oral Daily PRN Eligha Bridegroom, NP   30 mL at 07/05/22 1957   methimazole (TAPAZOLE) tablet 10 mg  10 mg Oral Daily Massengill, Harrold Donath, MD   10 mg at 07/09/22 0750   polyethylene glycol (MIRALAX / GLYCOLAX) packet 17 g  17 g Oral Daily Massengill, Harrold Donath, MD   17 g at 07/08/22 0829   propranolol ER (INDERAL LA) 24 hr capsule 60 mg  60 mg Oral Daily Massengill, Harrold Donath, MD   60 mg at 07/09/22 0948   risperiDONE (RISPERDAL M-TABS) disintegrating tablet 3 mg  3 mg Oral Q12H Massengill, Nathan, MD   3 mg at 07/09/22 0749   sodium chloride (OCEAN) 0.65 % nasal spray 1 spray  1 spray Each Nare PRN Carlyn Reichert, MD       traZODone (DESYREL) tablet 50 mg  50 mg Oral QHS PRN Phineas Inches, MD  PTA Medications: Medications Prior to Admission  Medication Sig Dispense Refill Last Dose   Multiple Vitamins-Minerals (MULTIVITAMIN ADULTS) TABS Take 1 tablet by mouth every morning.   Past Week   VRAYLAR 4.5 MG CAPS Take 1 capsule by mouth at bedtime.   Past Week   LORazepam (ATIVAN) 2 MG tablet Take 2 mg by mouth 2 (two) times daily.       Patient Stressors: Financial difficulties   Health problems   Legal issue    Patient Strengths: Manufacturing systems engineer  Supportive family/friends   Treatment Modalities: Medication Management, Group therapy, Case management,  1 to 1 session with clinician, Psychoeducation, Recreational therapy.   Physician Treatment Plan for Primary Diagnosis: Severe manic bipolar 1 disorder with psychotic behavior (HCC) Long Term Goal(s): Improvement in symptoms so as ready for discharge   Short Term Goals: Ability to identify changes in lifestyle to reduce recurrence of condition will improve Ability to  verbalize feelings will improve Ability to disclose and discuss suicidal ideas Ability to demonstrate self-control will improve Ability to identify and develop effective coping behaviors will improve Ability to maintain clinical measurements within normal limits will improve Compliance with prescribed medications will improve  Medication Management: Evaluate patient's response, side effects, and tolerance of medication regimen.  Therapeutic Interventions: 1 to 1 sessions, Unit Group sessions and Medication administration.  Evaluation of Outcomes: Progressing  Physician Treatment Plan for Secondary Diagnosis: Principal Problem:   Severe manic bipolar 1 disorder with psychotic behavior (HCC) Active Problems:   Hyperthyroidism   GAD (generalized anxiety disorder)   Insomnia  Long Term Goal(s): Improvement in symptoms so as ready for discharge   Short Term Goals: Ability to identify changes in lifestyle to reduce recurrence of condition will improve Ability to verbalize feelings will improve Ability to disclose and discuss suicidal ideas Ability to demonstrate self-control will improve Ability to identify and develop effective coping behaviors will improve Ability to maintain clinical measurements within normal limits will improve Compliance with prescribed medications will improve     Medication Management: Evaluate patient's response, side effects, and tolerance of medication regimen.  Therapeutic Interventions: 1 to 1 sessions, Unit Group sessions and Medication administration.  Evaluation of Outcomes: Progressing   RN Treatment Plan for Primary Diagnosis: Severe manic bipolar 1 disorder with psychotic behavior (HCC) Long Term Goal(s): Knowledge of disease and therapeutic regimen to maintain health will improve  Short Term Goals: Ability to remain free from injury will improve, Ability to verbalize frustration and anger appropriately will improve, Ability to participate in  decision making will improve, Ability to verbalize feelings will improve, Ability to identify and develop effective coping behaviors will improve, and Compliance with prescribed medications will improve  Medication Management: RN will administer medications as ordered by provider, will assess and evaluate patient's response and provide education to patient for prescribed medication. RN will report any adverse and/or side effects to prescribing provider.  Therapeutic Interventions: 1 on 1 counseling sessions, Psychoeducation, Medication administration, Evaluate responses to treatment, Monitor vital signs and CBGs as ordered, Perform/monitor CIWA, COWS, AIMS and Fall Risk screenings as ordered, Perform wound care treatments as ordered.  Evaluation of Outcomes: Progressing   LCSW Treatment Plan for Primary Diagnosis: Severe manic bipolar 1 disorder with psychotic behavior (HCC) Long Term Goal(s): Safe transition to appropriate next level of care at discharge, Engage patient in therapeutic group addressing interpersonal concerns.  Short Term Goals: Engage patient in aftercare planning with referrals and resources, Increase social support, Increase emotional regulation, Facilitate acceptance  of mental health diagnosis and concerns, Identify triggers associated with mental health/substance abuse issues, and Increase skills for wellness and recovery  Therapeutic Interventions: Assess for all discharge needs, 1 to 1 time with Social worker, Explore available resources and support systems, Assess for adequacy in community support network, Educate family and significant other(s) on suicide prevention, Complete Psychosocial Assessment, Interpersonal group therapy.  Evaluation of Outcomes: Progressing  Progress in Treatment: Attending groups: No. Participating in groups: No. Taking medication as prescribed: Yes. Toleration medication: Yes. Family/Significant other contact made: declined consents  Patient  understands diagnosis: Yes. Discussing patient identified problems/goals with staff: Yes. Medical problems stabilized or resolved: Yes. Denies suicidal/homicidal ideation: Yes. Issues/concerns per patient self-inventory: No.    New problem(s) identified: No, Describe:  None reported   New Short Term/Long Term Goal(s): medication stabilization, elimination of SI thoughts, development of comprehensive mental wellness plan   Patient Goals:  Medication Stabilization   Discharge Plan or Barriers: Patient recently admitted. CSW will continue to follow and assess for appropriate referrals and possible discharge planning   Reason for Continuation of Hospitalization: Depression Medication stabilization Suicidal ideation Withdrawal symptoms   Estimated Length of Stay:2-3 Days  Last 3 [place] Suicide Severity Risk Score: Flowsheet Row [facility] (Current) [facility]23/2022 [facility] ED from [facility]/30/2021 in [facility] Department at [facility]  C-SSRS RISK CATEGORY No Risk No Risk No Risk       Last PHQ 2/9 Scores:    11/12/2021    1:28 PM 12/26/2020   10:38 AM 05/27/2016    2:22 PM  Depression screen PHQ 2/9  Decreased Interest 0 1 3  Down, Depressed, Hopeless 0 1 3  PHQ - 2 Score 0 2 6  Altered sleeping  2 1  Tired, decreased energy  2 3  Change in appetite  0 0  Feeling bad or failure about yourself   0 0  Trouble concentrating  1 0  Moving slowly or fidgety/restless  0 0  Suicidal thoughts   0  PHQ-9 Score  7 10  Difficult doing work/chores  Somewhat difficult     Scribe for Treatment Team: Beather Arbour 07/09/2022 3:29 PM

## 2022-07-09 NOTE — Group Note (Signed)
Recreation Therapy Group Note   Group Topic:Self-Esteem  Group Date: 07/09/2022 Start Time: 1020 End Time: 1050 Facilitators: Delwin Raczkowski-McCall, LRT,CTRS Location: 500 [address] Dayroom   Goal Area(s) Addresses:  Patient will successfully identify positive attributes about themselves.  Patient will identify healthy ways to increase self-esteem. Patient will acknowledge benefit(s) of improved self-esteem.   Group Description:  Totika.  LRT and patients discussed the importance of self-esteem and why it's important.  LRT and patients then played a few rounds of Totika.  Consuello Masse is played like [place] except the blocks are different colors and patients answered questions that corresponded with each color.  LRT asked patients questions dealing with self-esteem during activity.   Affect/Mood: N/A   Participation Level: Did not attend    Clinical Observations/Individualized Feedback:    Plan: Continue to engage patient in RT group sessions 2-3x/week.   Veronica Perez, LRT,CTRS 07/09/2022 1:36 PM

## 2022-07-09 NOTE — Progress Notes (Signed)
   07/09/22 1945  Psych Admission Type (Psych Patients Only)  Admission Status Involuntary  Psychosocial Assessment  Patient Complaints None  Eye Contact Fair  Facial Expression Animated  Affect Appropriate to circumstance  Speech Logical/coherent  Interaction Assertive  Motor Activity Fidgety  Appearance/Hygiene Unremarkable  Behavior Characteristics Cooperative;Appropriate to situation  Mood Pleasant  Thought Process  Coherency Circumstantial  Content WDL  Delusions None reported or observed  Perception WDL  Hallucination None reported or observed  Judgment Poor  Confusion None  Danger to Self  Current suicidal ideation? Denies  Self-Injurious Behavior No self-injurious ideation or behavior indicators observed or expressed   Agreement Not to Harm Self Yes  Description of Agreement Verbal  Danger to Others  Danger to Others None reported or observed

## 2022-07-09 NOTE — Progress Notes (Signed)
   07/09/22 0545  15 Minute Checks  Location Bedroom  Visual Appearance Calm  Behavior Sleeping  Sleep (Behavioral Health Patients Only)  Calculate sleep? (Click Yes once per 24 hr at 0600 safety check) Yes  Documented sleep last 24 hours 10

## 2022-07-09 NOTE — Progress Notes (Signed)
[facility] LCSW Progress Note   07/09/2022    9:36 AM  Veronica Perez   [id number]   Type of Contact and Topic:   Medicare Notice  Patient informed of right to appeal discharge, provided phone number to [facility]. Patient expressed no interest in appealing discharge at this time. CSW will continue to monitor situation.     Signed:  Anson [place] MSW, LCSW, LCAS 07/09/2022 9:36 AM

## 2022-07-09 NOTE — Progress Notes (Signed)
   07/09/22 0623  Vital Signs  Pulse Rate 95  Pulse Rate Source Monitor  BP (!) 95/53  BP Location Right Arm  BP Method Automatic  Patient Position (if appropriate) Standing  Oxygen Therapy  SpO2 98 %   Patient asymptomatic. Fluids offered and accepted. Patient remains safe at this time.

## 2022-07-09 NOTE — Progress Notes (Signed)
[facility] LCSW Progress Note   07/09/2022    2:04 PM  NEFERTARI PIKER   [id number]   Type of Contact and Topic:  ACTT services: [facility]  Jeannett Senior, Team Lead, agreed to assess patient for ACTT services tomorrow afternoon.      Signed:  Anson [place] MSW, LCSW, LCAS 07/09/2022 2:04 PM

## 2022-07-10 DIAGNOSIS — F3164 Bipolar disorder, current episode mixed, severe, with psychotic features: Secondary | ICD-10-CM

## 2022-07-10 LAB — VALPROIC ACID LEVEL: Valproic Acid Lvl: 77 ug/mL (ref 50.0–100.0)

## 2022-07-10 MED ORDER — SALINE SPRAY 0.65 % NA SOLN: 1.0000 | NASAL | 0 refills | Status: DC | PRN

## 2022-07-10 MED ORDER — DIVALPROEX SODIUM ER 500 MG PO TB24: 1500.0000 mg | ORAL_TABLET | Freq: Every day | ORAL | 0 refills | Status: DC

## 2022-07-10 MED ORDER — RISPERIDONE 3 MG PO TABS: 3.0000 mg | ORAL_TABLET | Freq: Two times a day (BID) | ORAL | 0 refills | Status: DC

## 2022-07-10 MED ORDER — LORAZEPAM 0.5 MG PO TABS: 0.5000 mg | ORAL_TABLET | Freq: Two times a day (BID) | ORAL | 0 refills | Status: AC

## 2022-07-10 MED ORDER — METHIMAZOLE 10 MG PO TABS: 10.0000 mg | ORAL_TABLET | Freq: Every day | ORAL | 0 refills | Status: DC

## 2022-07-10 MED ORDER — FLUTICASONE PROPIONATE 50 MCG/ACT NA SUSP: 1.0000 | Freq: Every day | NASAL | 2 refills | Status: DC

## 2022-07-10 MED ORDER — HYDROCORTISONE 1 % EX CREA: TOPICAL_CREAM | Freq: Three times a day (TID) | CUTANEOUS | 0 refills | Status: DC

## 2022-07-10 MED ORDER — PROPRANOLOL HCL ER 60 MG PO CP24: 60.0000 mg | ORAL_CAPSULE | Freq: Every day | ORAL | 0 refills | Status: DC

## 2022-07-10 MED ORDER — MEDROXYPROGESTERONE ACETATE 150 MG/ML IM SUSP: 150.0000 mg | INTRAMUSCULAR | 0 refills | Status: DC

## 2022-07-10 MED ORDER — RISPERIDONE 3 MG PO TABS
3.0000 mg | ORAL_TABLET | Freq: Two times a day (BID) | ORAL | Status: DC
  Filled 2022-07-10 (×3): qty 1

## 2022-07-10 MED ORDER — CHLORPROMAZINE HCL 10 MG PO TABS: 10.0000 mg | ORAL_TABLET | Freq: Three times a day (TID) | ORAL | 0 refills | Status: DC

## 2022-07-10 NOTE — Group Note (Signed)
Date:  07/10/2022 Time:  10:18 AM  Group Topic/Focus:  Goals Group:   The focus of this group is to help patients establish daily goals to achieve during treatment and discuss how the patient can incorporate goal setting into their daily lives to aide in recovery. Orientation:   The focus of this group is to educate the patient on the purpose and policies of crisis stabilization and provide a format to answer questions about their admission.  The group details unit policies and expectations of patients while admitted.    Participation Level:  Active  Participation Quality:  Appropriate  Affect:  Appropriate  Cognitive:  Appropriate  Insight: Appropriate  Engagement in Group:  Engaged  Modes of Intervention:  Discussion  Additional Comments:  Patient attended morning orientation/goal group and said that her goal for today is to stay awake.   Bailen Geffre W Dionne Rossa 07/10/2022, 10:18 AM

## 2022-07-10 NOTE — Group Note (Signed)
Recreation Therapy Group Note   Group Topic:Team Building  Group Date: 07/10/2022 Start Time: 1003 End Time: 1030 Facilitators: Kendell Gammon-McCall, LRT,CTRS Location: 500 [address] Dayroom   Goal Area(s) Addresses:  Patient will effectively work with peer towards shared goal.  Patient will identify skills used to make activity successful.  Patient will identify how skills used during activity can be applied to reach post d/c goals.   Group Description: [employer]. In teams of 5-6, patients were given 11 craft pipe cleaners. Using the materials provided, patients were instructed to compete again the opposing team(s) to build the tallest free-standing structure from floor level. The activity was timed; difficulty increased by Clinical research associate as Production designer, theatre/television/film continued.  Systematically resources were removed with additional directions for example, placing one arm behind their back, working in silence, and shape stipulations. LRT facilitated post-activity discussion reviewing team processes and necessary communication skills involved in completion. Patients were encouraged to reflect how the skills utilized, or not utilized, in this activity can be incorporated to positively impact support systems post discharge.   Affect/Mood: Appropriate   Participation Level: Engaged   Participation Quality: Independent   Behavior: Appropriate   Speech/Thought Process: Focused   Insight: Good   Judgement: Good   Modes of Intervention: STEM Activity   Patient Response to Interventions:  Engaged   Education Outcome:  Acknowledges education   Clinical Observations/Individualized Feedback: Pt was bright and engaged.  Pt worked well with peer in coming up with an idea then constructing the tower as they saw it.  Pt shared the hardest part of the activity was not being able to talk and putting one hand behind your back.    Plan: Continue to engage patient in RT group sessions  2-3x/week.   Arseniy Toomey-McCall, LRT,CTRS 07/10/2022 12:21 PM

## 2022-07-10 NOTE — Progress Notes (Signed)
  [facility] Adult Case Management Discharge Plan :  Will you be returning to the same living situation after discharge:  Yes,  home At discharge, do you have transportation home?: Yes,  father will pick patient up Do you have the ability to pay for your medications: Yes,  insurance   Release of information consent forms completed and in the chart;  Patient's signature needed at discharge.  Patient to Follow up at:  Follow-up Information     [facility]. Go to.   Specialty: Behavioral Health Why: You have a hospital follow up appt on June 19th at 11:50am.  Please bring meds and discharge paperwork to this appointment. Contact information: [address] [place] [address] [phone]         [facility] Follow up.   Why: Please follow up with this provider for ongoing support. They provide wrap around support with where you are at. Contact information: [address] [place] [address] [phone]                 Next level of care provider has access to [facility] Link:no  Safety Planning and Suicide Prevention discussed: Yes,  with patient, patient declined consents, physician notified     Has patient been referred to the Quitline?: Patient refused referral for treatment  Patient has been referred for addiction treatment: N/A  Keiran Sias E [place] Nies, LCSW 07/10/2022, 9:52 AM

## 2022-07-10 NOTE — Discharge Summary (Signed)
Physician Discharge Summary Note  Patient:  Veronica Perez is an 47 y.o., female MRN:  [id number] DOB:  1975-07-11 Patient phone:  [phone] (home)  Patient address:   [address] [place] [address],  Total Time spent with patient: 20 minutes  Date of Admission:  06/24/2022 Date of Discharge: 07-10-2022  Reason for Admission:   Veronica Perez Veronica Perez is a 47 year old Caucasian female with past psychiatric history of bipolar 1 disorder who presented to the [facility] on 06/23/22 with disorganized and pressured speech, disoriented unable to state how or reason for presenting to ED. Per collateral information obtained by the ED provider from pt's family, patient had not been adequately caring for herself, experiencing insomnia, disorientation, and repeated calls to law enforcement reporting she was in danger. Per EDP notes, patient's family reported not knowing how the patient got to the ER. She was IVC'd in ED for concern of her being a danger to self and others related to her altered mental status. She was assessed and recommended for inpatient hospitalization; transferred to [facility] on 05/21 for further stabilization and treatment.   Principal Problem: Severe manic bipolar 1 disorder with psychotic behavior [facility]) Discharge Diagnoses: Principal Problem:   Severe manic bipolar 1 disorder with psychotic behavior (HCC) Active Problems:   Hyperthyroidism   GAD (generalized anxiety disorder)   Insomnia   Past Psychiatric History:  Previous Psych Diagnoses: Bipolar 1 disorder Prior inpatient treatment:as per chart review she has a history of representation similar to her current one when she was hospitalized at this hospital from November 26, 2020 through December 15, 2020.  -[facility]-06/27/2019 through 07/04/2019 -[facility]-November 2014  Current/prior outpatient treatment: Dr. Betti Cruz with [facility]. Prior rehab hx: Denies Psychotherapy hx: Denies History of suicide attempts:  Denies History of homicide or aggression: Denies  Psychiatric medication history:Tegretol, Celexa, Flexeril, Latuda, trazodone, Risperdal, Risperdal Consta, melatonin, Inderal, Seroquel, Ativan, trazodone.   Psychiatric medication compliance history: Patient denies compliance to medications, she reports that she was started on Vraylar most recently, but has not been taking the medication because it was causing "issues with my heart."   Neuromodulation history: Denies Current Psychiatrist: Dr. Betti Cruz Current therapist: None  Past Medical History:  Past Medical History:  Diagnosis Date   Bipolar disorder (HCC)    Depression    Former smoker, stopped smoking many years ago    HSV (herpes simplex virus) infection    Hypothyroid    Low TSH level 05/30/2016   Panic attacks    Thyroid disease     Past Surgical History:  Procedure Laterality Date   ADENOIDECTOMY     OVARIAN CYST SURGERY     TONSILLECTOMY     Family History:  Family History  Problem Relation Age of Onset   Diabetes Father    Heart disease Father    Cancer Maternal Grandmother        breast cancer   Bipolar disorder Mother    Thyroid disease Neg Hx    Family Psychiatric  History:  Medical: Denies  Psych: Denies  Psych Rx: Denies  SA/HA: Denies  Substance use family hx: Denies   Social History:  Social History   Substance and Sexual Activity  Alcohol Use Not Currently   Comment: denies     Social History   Substance and Sexual Activity  Drug Use No    Social History   Socioeconomic History   Marital status: Single    Spouse name: Not on file   Number  of children: 1   Years of education: Not on file   Highest education level: High school graduate  Occupational History   Not on file  Tobacco Use   Smoking status: Every Day    Packs/day: .25    Types: Cigarettes   Smokeless tobacco: Never  Vaping Use   Vaping Use: Never used  Substance and Sexual Activity   Alcohol use: Not Currently     Comment: denies   Drug use: No   Sexual activity: Yes    Birth control/protection: None  Other Topics Concern   Not on file  Social History Narrative   Not on file   Social Determinants of Health   Financial Resource Strain: Not on file  Food Insecurity: Patient Declined (06/24/2022)   Hunger Vital Sign    Worried About Running Out of Food in the Last Year: Patient declined    Ran Out of Food in the Last Year: Patient declined  Transportation Needs: Patient Declined (06/24/2022)   PRAPARE - Administrator, Civil Service (Medical): Patient declined    Lack of Transportation (Non-Medical): Patient declined  Physical Activity: Not on file  Stress: Not on file  Social Connections: Not on file    Hospital Course:   During the patient's hospitalization, patient had extensive initial psychiatric evaluation, and follow-up psychiatric evaluations every day.  Psychiatric diagnoses provided upon initial assessment:  Severe manic bipolar 1 disorder with psychotic behavior (HCC)   Hyperthyroidism   GAD (generalized anxiety disorder)   Insomnia  Patient's psychiatric medications were adjusted on admission:  -Start risperidone 1 mg oral solution every 12 hours. -Start propranolol 10 mg every 12 hours for anxiety -Start trazodone 50 mg nightly as needed for sleep -Start hydroxyzine 25 mg 3 times daily as needed for anxiety -Start Ativan 1 mg BID for GAD (home med)  During the hospitalization, other adjustments were made to the patient's psychiatric medication regimen:  -risperdal was increased to 3 mg bid. Pt declined [employer]  -propranolol was increased to ER 60 mgonce daily -thorazine was started, increased to 50 mg tid, but ultimately decreased to 10 mg tid leading up to discharge -ativan was decr to 0.5 mg tid -methimazole was started and increased to 10 mg once daily  -depakote was started and increased to ER 1500 mg qhs  -depoprovera q3 months was given on  07-07-2022  Patient's care was discussed during the interdisciplinary team meeting every day during the hospitalization.  The patient reported some sedation, dry mouth, and hard stools (having daily BM), having side effects to prescribed psychiatric medication.  Gradually, patient started adjusting to milieu. The patient was evaluated each day by a clinical provider to ascertain response to treatment. Improvement was noted by the patient's report of decreasing symptoms, improved sleep and appetite, affect, medication tolerance, behavior, and participation in unit programming.  Patient was asked each day to complete a self inventory noting mood, mental status, pain, new symptoms, anxiety and concerns.    Symptoms were reported as significantly decreased or resolved completely by discharge.   On day of discharge, the patient reports that their mood is stable. The patient denied having suicidal thoughts for more than 48 hours prior to discharge.  Patient denies having homicidal thoughts.  Patient denies having auditory hallucinations.  Patient denies any visual hallucinations. She still has paranoid delusions but they are less bothersome, intrusive, and pt is able to care for self despite having these - per fiancee pt is at psychiatric baseline.  The patient was motivated to continue taking medication with a goal of continued improvement in mental health.   The patient reports their target psychiatric symptoms of mania and psychosis responded well to the psychiatric medications, and the patient reports overall benefit other psychiatric hospitalization. Per fiancee, pt is at psychiatric baseline at discharge. Supportive psychotherapy was provided to the patient. The patient also participated in regular group therapy while hospitalized. Coping skills, problem solving as well as relaxation therapies were also part of the unit programming.  Labs were reviewed with the patient, and abnormal results were discussed  with the patient.  The patient is able to verbalize their individual safety plan to this provider.  # It is recommended to the patient to continue psychiatric medications as prescribed, after discharge from the hospital.    # It is recommended to the patient to follow up with your outpatient psychiatric provider and PCP.  # It was discussed with the patient, the impact of alcohol, drugs, tobacco have been there overall psychiatric and medical wellbeing, and total abstinence from substance use was recommended the patient.ed.  # Prescriptions provided or sent directly to preferred pharmacy at discharge. Patient agreeable to plan. Given opportunity to ask questions. Appears to feel comfortable with discharge.    # In the event of worsening symptoms, the patient is instructed to call the crisis hotline, 911 and or go to the nearest ED for appropriate evaluation and treatment of symptoms. To follow-up with primary care provider for other medical issues, concerns and or health care needs  # Patient was discharged to home (father picking pt up and driving pt home), with a plan to follow up as noted below. ACT to meet patient for intake on day of discharge, before leaving the hospital.    Physical Findings: AIMS: Facial and Oral Movements Muscles of Facial Expression: None, normal Lips and Perioral Area: None, normal Jaw: None, normal Tongue: None, normal,Extremity Movements Upper (arms, wrists, hands, fingers): None, normal Lower (legs, knees, ankles, toes): Minimal (restless, marches in place at times), Trunk Movements Neck, shoulders, hips: None, normal, Overall Severity Severity of abnormal movements (highest score from questions above): Minimal Incapacitation due to abnormal movements: None, normal Patient's awareness of abnormal movements (rate only patient's report): No Awareness, Dental Status Current problems with teeth and/or dentures?: No Does patient usually wear dentures?: No   CIWA:    COWS:     Aims score zero on my exam. No eps on my exam.   Musculoskeletal: Strength & Muscle Tone: within normal limits Gait & Station: normal Patient leans: N/A   Psychiatric Specialty Exam:  Presentation  General Appearance:  Casual; Fairly Groomed  Eye Contact: Good  Speech: Normal Rate; Clear and Coherent  Speech Volume: Normal  Handedness: Right   Mood and Affect  Mood: Euthymic; Anxious  Affect: Appropriate; Congruent; Full Range   Thought Process  Thought Processes: Linear  Descriptions of Associations:Intact  Orientation:Full (Time, Place and Person)  Thought Content:Logical  History of Schizophrenia/Schizoaffective disorder:No  Duration of Psychotic Symptoms:Less than six months  Hallucinations:Hallucinations: None  Ideas of Reference:Delusions; Paranoia (less intense since admission, less bothersome to the patient)  Suicidal Thoughts:Suicidal Thoughts: No  Homicidal Thoughts:Homicidal Thoughts: No   Sensorium  Memory: Immediate Fair; Recent Fair; Remote Fair  Judgment: Fair  Insight: Fair   Executive Functions  Concentration: Poor  Attention Span: Poor  Recall: Good  Fund of Knowledge: Good  Language: Good   Psychomotor Activity  Psychomotor Activity: Psychomotor Activity: Normal  Assets  Assets: Resilience   Sleep  Sleep: Sleep: Fair    Physical Exam: Physical Exam Vitals reviewed.  Pulmonary:     Effort: Pulmonary effort is normal.  Neurological:     Mental Status: She is alert.     Motor: No weakness.     Gait: Gait normal.  Psychiatric:        Mood and Affect: Mood normal.        Behavior: Behavior normal.        Judgment: Judgment normal.    Review of Systems  Constitutional:  Negative for chills and fever.  Cardiovascular:  Negative for chest pain and palpitations.  Neurological:  Negative for dizziness, tingling, tremors and headaches.  Psychiatric/Behavioral:   Negative for depression, hallucinations, memory loss, substance abuse and suicidal ideas. The patient is nervous/anxious. The patient does not have insomnia.   All other systems reviewed and are negative.  Blood pressure 119/74, pulse 83, temperature 98.9 F (37.2 C), temperature source Oral, resp. rate 18, height 5\' 5"  (1.651 m), weight 88 kg, SpO2 99 %. Body mass index is 32.28 kg/m.   Social History   Tobacco Use  Smoking Status Every Day   Packs/day: .25   Types: Cigarettes  Smokeless Tobacco Never   Tobacco Cessation:  Prescription not provided because: pt declined NRT   Blood Alcohol level:  Lab Results  Component Value Date   ETH <10 06/23/2022   ETH <10 11/25/2020    Metabolic Disorder Labs:  Lab Results  Component Value Date   HGBA1C 5.2 06/27/2022   MPG 103 06/27/2022   MPG 96.8 11/28/2020   Lab Results  Component Value Date   PROLACTIN 13.1 06/28/2019   PROLACTIN 69.3 (H) 09/17/2016   Lab Results  Component Value Date   CHOL 122 06/27/2022   TRIG 162 (H) 06/27/2022   HDL 31 (L) 06/27/2022   CHOLHDL 3.9 06/27/2022   VLDL 32 06/27/2022   LDLCALC 59 06/27/2022   LDLCALC 58 11/28/2020    See Psychiatric Specialty Exam and Suicide Risk Assessment completed by Attending Physician prior to discharge.  Discharge destination:  Home  Is patient on multiple antipsychotic therapies at discharge:  No   Has Patient had three or more failed trials of antipsychotic monotherapy by history:  Yes,   Antipsychotic medications that previously failed include:   1.  vraylar., 2.  Lower doses of risperdal., and 3.  seroquel.  Recommended Plan for Multiple Antipsychotic Therapies: Taper to monotherapy as described:  if pt remains stable, taper off of thorazine and continue risperdal only  Discharge Instructions     Ambulatory referral to Endocrinology   Complete by: As directed    Hyperthyroidism. Not taking medications. restarted on propranolol and methimazole during  psychiatric admission.   Diet - low sodium heart healthy   Complete by: As directed    Increase activity slowly   Complete by: As directed       Allergies as of 07/10/2022       Reactions   Penicillins Hives, Itching, Nausea And Vomiting   Has patient had a PCN reaction causing immediate rash, facial/tongue/throat swelling, SOB or lightheadedness with hypotension: No Has patient had a PCN reaction causing severe rash involving mucus membranes or skin necrosis: No Has patient had a PCN reaction that required hospitalization: No Has patient had a PCN reaction occurring within the last 10 years: No If all of the above answers are "NO", then may proceed with Cephalosporin use.   Iodine  Lactose Intolerance (gi)    Penicillins Hives, Nausea And Vomiting        Medication List     STOP taking these medications    Vraylar 4.5 MG Caps Generic drug: Cariprazine HCl       TAKE these medications      Indication  chlorproMAZINE 10 MG tablet Commonly known as: THORAZINE Take 1 tablet (10 mg total) by mouth every 8 (eight) hours.  Indication: Manic-Depression   divalproex 500 MG 24 hr tablet Commonly known as: DEPAKOTE ER Take 3 tablets (1,500 mg total) by mouth at bedtime.  Indication: Manic Phase of Manic-Depression   fluticasone 50 MCG/ACT nasal spray Commonly known as: FLONASE Place 1 spray into both nostrils daily. Start taking on: July 11, 2022  Indication: Nonallergic Rhinitis   hydrocortisone cream 1 % Apply topically 3 (three) times daily.  Indication: Skin Inflammation   LORazepam 0.5 MG tablet Commonly known as: ATIVAN Take 1 tablet (0.5 mg total) by mouth 2 (two) times daily. What changed:  medication strength how much to take  Indication: Feeling Anxious   methimazole 10 MG tablet Commonly known as: TAPAZOLE Take 1 tablet (10 mg total) by mouth daily. Start taking on: July 11, 2022  Indication: Overactive Thyroid Gland   Multivitamin Adults  Tabs Take 1 tablet by mouth every morning.  Indication: vitamin   propranolol ER 60 MG 24 hr capsule Commonly known as: INDERAL LA Take 1 capsule (60 mg total) by mouth daily. Start taking on: July 11, 2022  Indication: High Blood Pressure Disorder, tachycardia, akethisia   risperiDONE 3 MG tablet Commonly known as: RISPERDAL Take 1 tablet (3 mg total) by mouth 2 (two) times daily. Start taking on: July 11, 2022  Indication: Manic Phase of Manic-Depression   sodium chloride 0.65 % Soln nasal spray Commonly known as: OCEAN Place 1 spray into both nostrils as needed for congestion.  Indication: allergies        Follow-up Information     [facility]. Go to.   Specialty: Behavioral Health Why: You have a hospital follow up appt on June 19th at 11:50am.  Please bring meds and discharge paperwork to this appointment. Contact information: [address] [place] [address] [phone]         [facility] Follow up.   Why: Please follow up with this provider for ongoing support. They provide wrap around support with where you are at. Contact information: [address] [place] [address] [phone]                 Follow-up recommendations:    Activity: as tolerated  Diet: heart healthy  Other: -Follow-up with your outpatient psychiatric provider -instructions on appointment date, time, and address (location) are provided to you in discharge paperwork.  -Take your psychiatric medications as prescribed at discharge - instructions are provided to you in the discharge paperwork  -Follow-up with outpatient primary care doctor and other specialists -for management of preventative medicine and chronic medical disease, including hyperthyroidism - we made a referral to an endocrinologist for you at discharge from this hospital.   -Testing: Follow-up with outpatient provider for lab results:   07-10-2022 Depakote level -  77 07-04-2022:  TSH <0.010; Free T4 1.10 (decreased from 2.71); Free T3 3.9 (decreased from 7.0)   -If you are prescribed an atypical antipsychotic medication, we recommend that your outpatient psychiatrist follow routine screening for side effects within 3 months  of discharge, including monitoring: AIMS scale, height, weight, blood pressure, fasting lipid panel, HbA1c, and fasting blood sugar.   -Recommend total abstinence from alcohol, tobacco, and other illicit drug use at discharge.   -If your psychiatric symptoms recur, worsen, or if you have side effects to your psychiatric medications, call your outpatient psychiatric provider, 911, [phone] or go to the nearest emergency department.  -If suicidal thoughts occur, immediately call your outpatient psychiatric provider, 911, [phone] or go to the nearest emergency department.    Signed: Cristy Hilts, MD 07/10/2022, 9:47 AM  Total Time Spent in Direct Patient Care:  I personally spent 35 minutes on the unit in direct patient care. The direct patient care time included face-to-face time with the patient, reviewing the patient's chart, communicating with other professionals, and coordinating care. Greater than 50% of this time was spent in counseling or coordinating care with the patient regarding goals of hospitalization, psycho-education, and discharge planning needs.   Phineas Inches, MD Psychiatrist

## 2022-07-10 NOTE — Progress Notes (Signed)
   07/10/22 0615  15 Minute Checks  Location Dayroom  Visual Appearance Calm  Behavior Composed  Sleep (Behavioral Health Patients Only)  Calculate sleep? (Click Yes once per 24 hr at 0600 safety check) Yes  Documented sleep last 24 hours 12

## 2022-07-10 NOTE — Progress Notes (Signed)
[facility] LCSW Progress Note   07/10/2022    9:54 AM  Veronica Perez   [id number]   Type of Contact and Topic:  ACTT visit  [facility] plans to come do an assessment with patient at 1:30pm.  Support will pick her up at 2:30pm.     Signed:  Anson [place] MSW, LCSW, LCAS 07/10/2022 9:54 AM

## 2022-07-10 NOTE — Progress Notes (Signed)
I went the unit on 6/5, around 4:45 in the afternoon, to ask the patient for her daughter's telephone number.  The patient was on the telephone with her fianc at the time, and I was able to speak with the patient's fianc on the unit telephone, with the patient present.  Veronica Perez states the patient is back to baseline and is ready for discharge.  Veronica Perez states that discharge on 6/7 is appropriate, and has no concerns for discharge.  I answered remaining questions regarding diagnosis, hospital course, treatment, and discharge planning.  At the end of the call, the patient's fianc had no further questions.

## 2022-07-10 NOTE — Progress Notes (Signed)
Pt discharged to lobby. Pt was stable and appreciative at that time. All papers and prescriptions were given and valuables returned. Verbal understanding expressed. Denies SI/HI and A/VH. Pt given opportunity to express concerns and ask questions.  

## 2022-07-10 NOTE — BHH Suicide Risk Assessment (Signed)
[facility] Discharge Suicide Risk Assessment   Principal Problem: Severe manic bipolar 1 disorder with psychotic behavior [facility]) Discharge Diagnoses: Principal Problem:   Severe manic bipolar 1 disorder with psychotic behavior (HCC) Active Problems:   Hyperthyroidism   GAD (generalized anxiety disorder)   Insomnia   Total Time spent with patient: 30 minutes  Veronica Perez is a 47 year old Caucasian female with past psychiatric history of bipolar 1 disorder who presented to the [facility] on 06/23/22 with disorganized and pressured speech, disoriented unable to state how or reason for presenting to ED. Per collateral information obtained by the ED provider from pt's family, patient had not been adequately caring for herself, experiencing insomnia, disorientation, and repeated calls to law enforcement reporting she was in danger. Per EDP notes, patient's family reported not knowing how the patient got to the ER. She was IVC'd in ED for concern of her being a danger to self and others related to her altered mental status. She was assessed and recommended for inpatient hospitalization; transferred to [facility] on 05/21 for further stabilization and treatment.   Psychiatric diagnoses provided upon initial assessment:  Severe manic bipolar 1 disorder with psychotic behavior (HCC)   Hyperthyroidism   GAD (generalized anxiety disorder)   Insomnia   Patient's psychiatric medications were adjusted on admission:  -Start risperidone 1 mg oral solution every 12 hours. -Start propranolol 10 mg every 12 hours for anxiety -Start trazodone 50 mg nightly as needed for sleep -Start hydroxyzine 25 mg 3 times daily as needed for anxiety -Start Ativan 1 mg BID for GAD (home med)   During the hospitalization, other adjustments were made to the patient's psychiatric medication regimen:  -risperdal was increased to 3 mg bid. Pt declined Fran Lowes  -propranolol was increased to ER 60 mgonce daily -thorazine was started, increased to  50 mg tid, but ultimately decreased to 10 mg tid leading up to discharge -ativan was decr to 0.5 mg tid -methimazole was started and increased to 10 mg once daily  -depakote was started and increased to ER 1500 mg qhs  -depoprovera q3 months was given on 07-07-2022  Psychiatric Specialty Exam  Presentation  General Appearance:  Casual; Fairly Groomed  Eye Contact: Good  Speech: Normal Rate; Clear and Coherent  Speech Volume: Normal  Handedness: Right   Mood and Affect  Mood: Euthymic; Anxious  Duration of Depression Symptoms: Greater than two weeks  Affect: Appropriate; Congruent; Full Range   Thought Process  Thought Processes: Linear  Descriptions of Associations:Intact  Orientation:Full (Time, Place and Person)  Thought Content:Logical  History of Schizophrenia/Schizoaffective disorder:No  Duration of Psychotic Symptoms:Less than six months  Hallucinations:Hallucinations: None  Ideas of Reference:Delusions; Paranoia (less intense since admission, less bothersome to the patient)  Suicidal Thoughts:Suicidal Thoughts: No  Homicidal Thoughts:Homicidal Thoughts: No   Sensorium  Memory: Immediate Fair; Recent Fair; Remote Fair  Judgment: Fair  Insight: Fair   Art therapist  Concentration: Poor  Attention Span: Poor  Recall: Good  Fund of Knowledge: Good  Language: Good   Psychomotor Activity  Psychomotor Activity: Psychomotor Activity: Normal   Assets  Assets: Resilience   Sleep  Sleep: Sleep: Fair   Physical Exam: Physical Exam See discharge summary  ROS See discharge summary  Blood pressure 119/74, pulse 83, temperature 98.9 F (37.2 C), temperature source Oral, resp. rate 18, height 5\' 5"  (1.651 m), weight 88 kg, SpO2 99 %. Body mass index is 32.28 kg/m.  Mental Status Per Nursing Assessment::   On Admission:  NA  Demographic factors:  Unemployed, Low socioeconomic status Current Mental Status:   NA Loss Factors:  Financial problems / change in socioeconomic status Historical Factors:  NA Risk Reduction Factors:  Living with another person, especially a relative  Continued Clinical Symptoms:  Mood is stable. Mania resolved. Still has paranoid delusions but are less bothersome, creating less anxiety, and per fiancee pt is at psychiatric baseline. Denying AH, VH. Denying SI, HI.   Cognitive Features That Contribute To Risk:  Closed-mindedness    Suicide Risk:  Mild:  There are no identifiable suicide plans, no associated intent, mild dysphoria and related symptoms, good self-control (both objective and subjective assessment), few other risk factors, and identifiable protective factors, including available and accessible social support.    Follow-up Information     Center, [facility]. Go to.   Specialty: Behavioral Health Why: You have a hospital follow up appt on June 19th at 11:50am.  Please bring meds and discharge paperwork to this appointment. Contact information: [address] [place] [address] [phone]         [facility] Follow up.   Why: Please follow up with this provider for ongoing support. They provide wrap around support with where you are at. Contact information: [address] [place] [address] [phone]                 Plan Of Care/Follow-up recommendations:   -Follow-up with your outpatient psychiatric provider -instructions on appointment date, time, and address (location) are provided to you in discharge paperwork.   -Take your psychiatric medications as prescribed at discharge - instructions are provided to you in the discharge paperwork   -Follow-up with outpatient primary care doctor and other specialists -for management of preventative medicine and chronic medical disease, including hyperthyroidism - we made a referral to an endocrinologist for you at discharge  from this hospital.    -Testing: Follow-up with outpatient provider for lab results:  07-10-2022 Depakote level -  77 07-04-2022:  TSH <0.010; Free T4 1.10 (decreased from 2.71); Free T3 3.9 (decreased from 7.0)    -If you are prescribed an atypical antipsychotic medication, we recommend that your outpatient psychiatrist follow routine screening for side effects within 3 months of discharge, including monitoring: AIMS scale, height, weight, blood pressure, fasting lipid panel, HbA1c, and fasting blood sugar.    -Recommend total abstinence from alcohol, tobacco, and other illicit drug use at discharge.    -If your psychiatric symptoms recur, worsen, or if you have side effects to your psychiatric medications, call your outpatient psychiatric provider, 911, [phone] or go to the nearest emergency department.   -If suicidal thoughts occur, immediately call your outpatient psychiatric provider, 911, [phone] or go to the nearest emergency department.   Cristy Hilts, MD 07/10/2022, 9:59 AM

## 2022-10-02 DIAGNOSIS — E559 Vitamin D deficiency, unspecified: Secondary | ICD-10-CM | POA: Diagnosis not present

## 2022-10-02 DIAGNOSIS — E7800 Pure hypercholesterolemia, unspecified: Secondary | ICD-10-CM | POA: Diagnosis not present

## 2022-10-02 DIAGNOSIS — R300 Dysuria: Secondary | ICD-10-CM | POA: Diagnosis not present

## 2022-10-02 DIAGNOSIS — F32A Depression, unspecified: Secondary | ICD-10-CM | POA: Diagnosis not present

## 2022-10-02 DIAGNOSIS — Z0000 Encounter for general adult medical examination without abnormal findings: Secondary | ICD-10-CM | POA: Diagnosis not present

## 2022-10-02 DIAGNOSIS — R5383 Other fatigue: Secondary | ICD-10-CM | POA: Diagnosis not present

## 2022-10-02 DIAGNOSIS — R030 Elevated blood-pressure reading, without diagnosis of hypertension: Secondary | ICD-10-CM | POA: Diagnosis not present

## 2022-10-02 DIAGNOSIS — Z79899 Other long term (current) drug therapy: Secondary | ICD-10-CM | POA: Diagnosis not present

## 2022-10-16 DIAGNOSIS — E0590 Thyrotoxicosis, unspecified without thyrotoxic crisis or storm: Secondary | ICD-10-CM | POA: Diagnosis not present

## 2022-10-16 DIAGNOSIS — E7800 Pure hypercholesterolemia, unspecified: Secondary | ICD-10-CM | POA: Diagnosis not present

## 2022-11-26 ENCOUNTER — Emergency Department (HOSPITAL_COMMUNITY)
Admission: AD | Admit: 2022-11-26 | Discharge: 2022-11-27 | Disposition: A | Discharge status: Home / Self Care | Payer: 59 | Attending: Obstetrics and Gynecology | Admitting: Obstetrics and Gynecology

## 2022-11-26 ENCOUNTER — Other Ambulatory Visit: Payer: Self-pay

## 2022-11-26 ENCOUNTER — Encounter (HOSPITAL_COMMUNITY): Payer: Self-pay | Admitting: Obstetrics and Gynecology

## 2022-11-26 DIAGNOSIS — R198 Other specified symptoms and signs involving the digestive system and abdomen: Secondary | ICD-10-CM

## 2022-11-26 DIAGNOSIS — R140 Abdominal distension (gaseous): Secondary | ICD-10-CM | POA: Insufficient documentation

## 2022-11-26 DIAGNOSIS — D3501 Benign neoplasm of right adrenal gland: Secondary | ICD-10-CM | POA: Diagnosis not present

## 2022-11-26 DIAGNOSIS — N644 Mastodynia: Secondary | ICD-10-CM | POA: Insufficient documentation

## 2022-11-26 DIAGNOSIS — Z789 Other specified health status: Secondary | ICD-10-CM

## 2022-11-26 DIAGNOSIS — K429 Umbilical hernia without obstruction or gangrene: Secondary | ICD-10-CM | POA: Diagnosis not present

## 2022-11-26 LAB — COMPREHENSIVE METABOLIC PANEL
ALT: 23 U/L (ref 0–44)
AST: 22 U/L (ref 15–41)
Albumin: 3.6 g/dL (ref 3.5–5.0)
Alkaline Phosphatase: 59 U/L (ref 38–126)
Anion gap: 11 (ref 5–15)
BUN: 7 mg/dL (ref 6–20)
CO2: 22 mmol/L (ref 22–32)
Calcium: 8.8 mg/dL — ABNORMAL LOW (ref 8.9–10.3)
Chloride: 106 mmol/L (ref 98–111)
Creatinine, Ser: 0.57 mg/dL (ref 0.44–1.00)
GFR, Estimated: >60 mL/min (ref >60)
Glucose, Bld: 89 mg/dL (ref 70–99)
Potassium: 3.6 mmol/L (ref 3.5–5.1)
Sodium: 139 mmol/L (ref 135–145)
Total Bilirubin: 0.4 mg/dL (ref 0.3–1.2)
Total Protein: 6.9 g/dL (ref 6.5–8.1)

## 2022-11-26 LAB — POCT PREGNANCY, URINE: Preg Test, Ur: NEGATIVE

## 2022-11-26 LAB — CBC
HCT: 39.7 % (ref 36.0–46.0)
Hemoglobin: 13.2 g/dL (ref 12.0–15.0)
MCH: 26.1 pg (ref 26.0–34.0)
MCHC: 33.2 g/dL (ref 30.0–36.0)
MCV: 78.6 fL — ABNORMAL LOW (ref 80.0–100.0)
Platelets: 322 10*3/uL (ref 150–400)
RBC: 5.05 MIL/uL (ref 3.87–5.11)
RDW: 13.4 % (ref 11.5–15.5)
WBC: 11.3 10*3/uL — ABNORMAL HIGH (ref 4.0–10.5)
nRBC: 0 % (ref 0.0–0.2)

## 2022-11-26 LAB — LIPASE, BLOOD: Lipase: 24 U/L (ref 11–51)

## 2022-11-26 NOTE — Discharge Instructions (Signed)
You are not pregnant.  Your workup today was reassuring.  Follow up with the OB/GYN listed below.

## 2022-11-26 NOTE — MAU Note (Addendum)
..  Veronica Perez is a 47 y.o. at Unknown here in MAU reporting: nipple changes, where her areola has gotten larger and she gets a sharp shooting pain.  Reports that her size changed from M/L to 2X, the weight is normally in her abdomen. Reports having to void more often.  Pain score: 0/10 Has had negative pregnancy tests at home. LMP: 08/18/2022 Onset of complaint:   Vitals:   11/26/22 1951  BP: (!) 123/59  Pulse: 88  Resp: 20  Temp: 98.3 F (36.8 C)  SpO2: 100%     FHT:n/a Lab orders placed from triage: urine pregnancy test

## 2022-11-26 NOTE — MAU Provider Note (Addendum)
Event Date/Time   First Provider Initiated Contact with Patient 11/26/22 2025      S Veronica Perez is a 47 y.o. G1P1001 patient who presents to MAU today with complaint of abdominal girth increasing several dress sizes since May with breast changes concerning for pregnancy.   O BP (!) 123/59 (BP Location: Right Arm)   Pulse 88   Temp 98.3 F (36.8 C) (Oral)   Resp 20   Ht 5\' 4"  (1.626 m)   Wt 94.3 kg   SpO2 100%   BMI 35.70 kg/m  Physical Exam Vitals and nursing note reviewed.  Constitutional:      General: She is not in acute distress.    Appearance: Normal appearance. She is not ill-appearing.  HENT:     Head: Normocephalic and atraumatic.     Nose: Nose normal.     Mouth/Throat:     Mouth: Mucous membranes are moist.     Pharynx: Oropharynx is clear.  Eyes:     Extraocular Movements: Extraocular movements intact.     Conjunctiva/sclera: Conjunctivae normal.  Cardiovascular:     Rate and Rhythm: Normal rate.  Pulmonary:     Effort: Pulmonary effort is normal. No respiratory distress.  Abdominal:     General: Abdomen is flat.     Palpations: Abdomen is soft.     Tenderness: There is no abdominal tenderness.  Musculoskeletal:        General: Swelling present. Normal range of motion.     Cervical back: Normal range of motion.  Skin:    General: Skin is warm and dry.  Neurological:     Mental Status: She is alert and oriented to person, place, and time. Mental status is at baseline.     Motor: No weakness.     Gait: Gait normal.  Psychiatric:        Mood and Affect: Mood normal.    Upreg negative  A&P: Discharge from MAU in stable condition Patient given the option of transfer to [facility] for further evaluation or seek care in outpatient facility of choice  Call to ER provider to give report List of options for follow-up given  Warning signs for worsening condition that would warrant emergency follow-up discussed Patient may return to MAU as needed for  pregnancy related problems/emergencies   Hessie Dibble, MD 11/26/2022 8:29 PM

## 2022-11-26 NOTE — ED Triage Notes (Signed)
Swelling all over her body no period since July 15th neg pregnancy tests for 3-4 months [facility]

## 2022-11-27 ENCOUNTER — Emergency Department (HOSPITAL_COMMUNITY): Payer: 59

## 2022-11-27 DIAGNOSIS — D3501 Benign neoplasm of right adrenal gland: Secondary | ICD-10-CM | POA: Diagnosis not present

## 2022-11-27 DIAGNOSIS — R140 Abdominal distension (gaseous): Secondary | ICD-10-CM | POA: Diagnosis not present

## 2022-11-27 DIAGNOSIS — K429 Umbilical hernia without obstruction or gangrene: Secondary | ICD-10-CM | POA: Diagnosis not present

## 2022-11-27 NOTE — ED Provider Notes (Signed)
[place] EMERGENCY DEPARTMENT AT [facility] Provider Note   CSN: [id number] Arrival date & time: 11/26/22  1907     History  Chief Complaint  Patient presents with   Possible Pregnancy   body swelling    Veronica Perez is a 47 y.o. female who presents with concern that she believes she is pregnant despite 4 months of negative pregnancy tests and para mental spousal status.  Patient is a 47 year old female has been seen multiple times for same complaint.  States that her abdomen has been distended and rigid and that her breasts are larger than normal particular stating that her areolas and nipples are painful and swollen. Per chart Review patient has multiple visits for mania, and states has a history of chronic psychosis believing she is pregnant sometimes with twins going back several years.  Refuses to see psychiatrist.  Seen at MAU today with negative pregnancy test and directed to the ED.  HPI     Home Medications Prior to Admission medications   Medication Sig Start Date End Date Taking? Authorizing Provider  chlorproMAZINE (THORAZINE) 10 MG tablet Take 1 tablet (10 mg total) by mouth every 8 (eight) hours. 07/10/22 08/09/22  Massengill, Harrold Donath, MD  divalproex (DEPAKOTE ER) 500 MG 24 hr tablet Take 3 tablets (1,500 mg total) by mouth at bedtime. 07/10/22 08/09/22  Massengill, Harrold Donath, MD  fluticasone (FLONASE) 50 MCG/ACT nasal spray Place 1 spray into both nostrils daily. 07/11/22   Massengill, Harrold Donath, MD  hydrocortisone cream 1 % Apply topically 3 (three) times daily. 07/10/22   Massengill, Harrold Donath, MD  medroxyPROGESTERone (DEPO-PROVERA) 150 MG/ML injection Inject 1 mL (150 mg total) into the muscle every 3 (three) months for 1 dose. Next dose is due on 10-05-22. 10/05/22 10/06/22  Massengill, Harrold Donath, MD  methimazole (TAPAZOLE) 10 MG tablet Take 1 tablet (10 mg total) by mouth daily. 07/11/22 08/10/22  Massengill, Harrold Donath, MD  Multiple Vitamins-Minerals (MULTIVITAMIN ADULTS) TABS Take 1  tablet by mouth every morning.    [provider]  propranolol ER (INDERAL LA) 60 MG 24 hr capsule Take 1 capsule (60 mg total) by mouth daily. 07/11/22 08/10/22  Massengill, Harrold Donath, MD  risperiDONE (RISPERDAL) 3 MG tablet Take 1 tablet (3 mg total) by mouth 2 (two) times daily. 07/11/22 08/10/22  Massengill, Harrold Donath, MD  sodium chloride (OCEAN) 0.65 % SOLN nasal spray Place 1 spray into both nostrils as needed for congestion. 07/10/22   Massengill, Harrold Donath, MD      Allergies    Penicillins, Iodine, Lactose intolerance (gi), and Penicillins    Review of Systems   Review of Systems  Gastrointestinal:  Positive for abdominal distention.    Physical Exam Updated Vital Signs BP (!) 148/77 (BP Location: Right Arm)   Pulse 75   Temp 98.4 F (36.9 C) (Oral)   Resp 18   Ht 5\' 4"  (1.626 m)   Wt 94.3 kg   LMP 08/13/2022   SpO2 100%   BMI 35.68 kg/m  Physical Exam Vitals and nursing note reviewed.  Constitutional:      Appearance: She is not ill-appearing or toxic-appearing.  HENT:     Head: Normocephalic and atraumatic.     Mouth/Throat:     Mouth: Mucous membranes are moist.     Pharynx: No oropharyngeal exudate or posterior oropharyngeal erythema.  Eyes:     General:        Right eye: No discharge.        Left eye: No discharge.  Extraocular Movements: Extraocular movements intact.     Conjunctiva/sclera: Conjunctivae normal.     Pupils: Pupils are equal, round, and reactive to light.  Cardiovascular:     Rate and Rhythm: Normal rate and regular rhythm.     Pulses: Normal pulses.     Heart sounds: Normal heart sounds. No murmur heard. Pulmonary:     Effort: Pulmonary effort is normal. No respiratory distress.     Breath sounds: Normal breath sounds. No wheezing or rales.  Abdominal:     General: Bowel sounds are normal. There is no distension.     Palpations: Abdomen is soft.     Tenderness: There is no abdominal tenderness. There is no right CVA tenderness, left CVA  tenderness, guarding or rebound.  Musculoskeletal:        General: No deformity.     Cervical back: Neck supple.     Right lower leg: No edema.     Left lower leg: No edema.  Skin:    General: Skin is warm and dry.  Neurological:     General: No focal deficit present.     Mental Status: She is alert and oriented to person, place, and time. Mental status is at baseline.  Psychiatric:        Mood and Affect: Mood normal.     ED Results / Procedures / Treatments   Labs (all labs ordered are listed, but only abnormal results are displayed) Labs Reviewed  COMPREHENSIVE METABOLIC PANEL - Abnormal; Notable for the following components:      Result Value   Calcium 8.8 (*)    All other components within normal limits  CBC - Abnormal; Notable for the following components:   WBC 11.3 (*)    MCV 78.6 (*)    All other components within normal limits  LIPASE, BLOOD  POCT PREGNANCY, URINE    EKG None  Radiology CT ABDOMEN PELVIS WO CONTRAST  Result Date: 11/27/2022 CLINICAL DATA:  Abdominal swelling, bowel obstruction suspected. EXAM: CT ABDOMEN AND PELVIS WITHOUT CONTRAST TECHNIQUE: Multidetector CT imaging of the abdomen and pelvis was performed following the standard protocol without IV contrast. RADIATION DOSE REDUCTION: This exam was performed according to the departmental dose-optimization program which includes automated exposure control, adjustment of the mA and/or kV according to patient size and/or use of iterative reconstruction technique. COMPARISON:  10/19/2020. FINDINGS: Lower chest: No acute abnormality. Hepatobiliary: No focal liver abnormality is seen. No gallstones, gallbladder wall thickening, or biliary dilatation. Pancreas: Unremarkable. No pancreatic ductal dilatation or surrounding inflammatory changes. Spleen: Normal in size without focal abnormality. Adrenals/Urinary Tract: There is a 1.7 cm nodule in the right adrenal gland with attenuation of 5 Hounsfield units. The  left adrenal gland is within normal limits. No renal calculus or hydronephrosis bilaterally. The bladder is unremarkable. Stomach/Bowel: Stomach is within normal limits. Appendix appears normal. No evidence of bowel wall thickening, distention, or inflammatory changes. No free air or pneumatosis. Vascular/Lymphatic: No significant vascular findings are present. No enlarged abdominal or pelvic lymph nodes. Reproductive: The uterus is within normal limits. There is a cyst in the left adnexa measuring 2.9 cm. No follow-up imaging is recommended. No adnexal mass on the right. Other: No abdominopelvic ascites. A fat containing umbilical hernia is noted. Musculoskeletal: Degenerative changes are present in the thoracolumbar spine. A stable compression deformity is noted in the superior endplate at L2. No acute osseous abnormality. IMPRESSION: 1. No acute intra-abdominal process or evidence of bowel obstruction. 2. Stable right adrenal adenoma.  Electronically Signed   By: Thornell Sartorius M.D.   On: 11/27/2022 02:35    Procedures Procedures    Medications Ordered in ED Medications - No data to display  ED Course/ Medical Decision Making/ A&P                                 Medical Decision Making 47 y/o female who presents with concern she is pregnant.  G1, P1  Pulmonary abdominal exams are benign.  Patient perseverating on her suspected pregnancy.  Amount and/or Complexity of Data Reviewed Labs:     Details: CBC with leukocytosis of 11,000, CMP unremarkable, lipase is normal.  Negative pregnancy test.  Radiology: ordered.    Details:  CT negative bedside ultrasound performed by this provider without evidence of fetus.   Patient is not pregnant.  Suspect lack of menstrual cycle secondary to perimenopausal status.  Recommend OB/GYN follow-up.  Clinical concern for emergent underlying etiology of his patient symptoms that would warrant further ED workup or inpatient management is exceedingly  low.  This chart was dictated using voice recognition software, Dragon. Despite the best efforts of this provider to proofread and correct errors, errors may still occur which can change documentation meaning.         Final Clinical Impression(s) / ED Diagnoses Final diagnoses:  Increased abdominal girth  Not currently pregnant    Rx / DC Orders ED Discharge Orders     None         Sherrilee Gilles 11/27/22 1610    Gilda Crease, MD 11/28/22 [phone]

## 2023-01-20 ENCOUNTER — Encounter: Payer: 59 | Admitting: Obstetrics and Gynecology

## 2023-01-27 ENCOUNTER — Emergency Department
Admission: EM | Admit: 2023-01-27 | Discharge: 2023-01-30 | Disposition: A | Discharge status: Other Acute Inpatient Hospital | Payer: 59 | Attending: Emergency Medicine | Admitting: Emergency Medicine

## 2023-01-27 ENCOUNTER — Emergency Department: Payer: 59

## 2023-01-27 ENCOUNTER — Other Ambulatory Visit: Payer: Self-pay

## 2023-01-27 DIAGNOSIS — M549 Dorsalgia, unspecified: Secondary | ICD-10-CM | POA: Diagnosis not present

## 2023-01-27 DIAGNOSIS — K769 Liver disease, unspecified: Secondary | ICD-10-CM | POA: Insufficient documentation

## 2023-01-27 DIAGNOSIS — K828 Other specified diseases of gallbladder: Secondary | ICD-10-CM | POA: Insufficient documentation

## 2023-01-27 DIAGNOSIS — D3501 Benign neoplasm of right adrenal gland: Secondary | ICD-10-CM | POA: Diagnosis not present

## 2023-01-27 DIAGNOSIS — R1030 Lower abdominal pain, unspecified: Secondary | ICD-10-CM | POA: Insufficient documentation

## 2023-01-27 DIAGNOSIS — K838 Other specified diseases of biliary tract: Secondary | ICD-10-CM | POA: Diagnosis not present

## 2023-01-27 DIAGNOSIS — R109 Unspecified abdominal pain: Secondary | ICD-10-CM | POA: Diagnosis not present

## 2023-01-27 DIAGNOSIS — R4182 Altered mental status, unspecified: Secondary | ICD-10-CM | POA: Insufficient documentation

## 2023-01-27 DIAGNOSIS — E039 Hypothyroidism, unspecified: Secondary | ICD-10-CM | POA: Insufficient documentation

## 2023-01-27 DIAGNOSIS — R000 Tachycardia, unspecified: Secondary | ICD-10-CM | POA: Diagnosis not present

## 2023-01-27 DIAGNOSIS — R1901 Right upper quadrant abdominal swelling, mass and lump: Secondary | ICD-10-CM | POA: Insufficient documentation

## 2023-01-27 DIAGNOSIS — F312 Bipolar disorder, current episode manic severe with psychotic features: Secondary | ICD-10-CM | POA: Diagnosis present

## 2023-01-27 DIAGNOSIS — R112 Nausea with vomiting, unspecified: Secondary | ICD-10-CM | POA: Diagnosis not present

## 2023-01-27 DIAGNOSIS — T40721A Poisoning by synthetic cannabinoids, accidental (unintentional), initial encounter: Secondary | ICD-10-CM | POA: Insufficient documentation

## 2023-01-27 DIAGNOSIS — R0902 Hypoxemia: Secondary | ICD-10-CM | POA: Diagnosis not present

## 2023-01-27 DIAGNOSIS — I10 Essential (primary) hypertension: Secondary | ICD-10-CM | POA: Diagnosis not present

## 2023-01-27 LAB — URINALYSIS, ROUTINE W REFLEX MICROSCOPIC
Bilirubin Urine: NEGATIVE
Glucose, UA: NEGATIVE mg/dL
Ketones, ur: 80 mg/dL — AB
Nitrite: NEGATIVE
Protein, ur: 30 mg/dL — AB
Specific Gravity, Urine: 1.019 (ref 1.005–1.030)
pH: 5 (ref 5.0–8.0)

## 2023-01-27 LAB — CBC
HCT: 35.4 % — ABNORMAL LOW (ref 36.0–46.0)
Hemoglobin: 11.9 g/dL — ABNORMAL LOW (ref 12.0–15.0)
MCH: 26.9 pg (ref 26.0–34.0)
MCHC: 33.6 g/dL (ref 30.0–36.0)
MCV: 79.9 fL — ABNORMAL LOW (ref 80.0–100.0)
Platelets: 270 10*3/uL (ref 150–400)
RBC: 4.43 MIL/uL (ref 3.87–5.11)
RDW: 13 % (ref 11.5–15.5)
WBC: 8.1 10*3/uL (ref 4.0–10.5)
nRBC: 0 % (ref 0.0–0.2)

## 2023-01-27 LAB — URINE DRUG SCREEN, QUALITATIVE (ARMC ONLY)
Amphetamines, Ur Screen: NOT DETECTED
Barbiturates, Ur Screen: NOT DETECTED
Benzodiazepine, Ur Scrn: NOT DETECTED
Cannabinoid 50 Ng, Ur ~~LOC~~: POSITIVE — AB
Cocaine Metabolite,Ur ~~LOC~~: NOT DETECTED
MDMA (Ecstasy)Ur Screen: NOT DETECTED
Methadone Scn, Ur: NOT DETECTED
Opiate, Ur Screen: NOT DETECTED
Phencyclidine (PCP) Ur S: NOT DETECTED
Tricyclic, Ur Screen: NOT DETECTED

## 2023-01-27 LAB — COMPREHENSIVE METABOLIC PANEL
ALT: 50 U/L — ABNORMAL HIGH (ref 0–44)
AST: 34 U/L (ref 15–41)
Albumin: 3.4 g/dL — ABNORMAL LOW (ref 3.5–5.0)
Alkaline Phosphatase: 57 U/L (ref 38–126)
Anion gap: 18 — ABNORMAL HIGH (ref 5–15)
BUN: 13 mg/dL (ref 6–20)
CO2: 17 mmol/L — ABNORMAL LOW (ref 22–32)
Calcium: 8.5 mg/dL — ABNORMAL LOW (ref 8.9–10.3)
Chloride: 100 mmol/L (ref 98–111)
Creatinine, Ser: 0.64 mg/dL (ref 0.44–1.00)
GFR, Estimated: >60 mL/min (ref >60)
Glucose, Bld: 73 mg/dL (ref 70–99)
Potassium: 3.1 mmol/L — ABNORMAL LOW (ref 3.5–5.1)
Sodium: 135 mmol/L (ref 135–145)
Total Bilirubin: 1.2 mg/dL — ABNORMAL HIGH (ref <1.2)
Total Protein: 6.6 g/dL (ref 6.5–8.1)

## 2023-01-27 LAB — HCG, QUANTITATIVE, PREGNANCY: hCG, Beta Chain, Quant, S: <1 m[IU]/mL (ref <5)

## 2023-01-27 LAB — POC URINE PREG, ED
Preg Test, Ur: NEGATIVE
Preg Test, Ur: NEGATIVE

## 2023-01-27 LAB — LIPASE, BLOOD: Lipase: 26 U/L (ref 11–51)

## 2023-01-27 LAB — ETHANOL: Alcohol, Ethyl (B): <10 mg/dL (ref <10)

## 2023-01-27 MED ORDER — HYDROXYZINE HCL 25 MG PO TABS
50.0000 mg | ORAL_TABLET | Freq: Three times a day (TID) | ORAL | Status: DC | PRN
  Filled 2023-01-27: qty 2

## 2023-01-27 MED ORDER — OLANZAPINE 10 MG PO TBDP
10.0000 mg | ORAL_TABLET | Freq: Three times a day (TID) | ORAL | Status: DC | PRN
  Administered 2023-01-28 – 2023-01-30 (×2): 10 mg via ORAL
  Filled 2023-01-27: qty 1
  Filled 2023-01-27: qty 2
  Filled 2023-01-27: qty 1

## 2023-01-27 MED ORDER — ONDANSETRON HCL 4 MG/2ML IJ SOLN
4.0000 mg | Freq: Once | INTRAMUSCULAR | Status: AC
Start: 2023-01-27 — End: 2023-01-27
  Administered 2023-01-27: 4 mg via INTRAVENOUS
  Filled 2023-01-27: qty 2

## 2023-01-27 MED ORDER — POTASSIUM CHLORIDE 20 MEQ PO PACK
40.0000 meq | PACK | Freq: Once | ORAL | Status: AC
  Administered 2023-01-27: 40 meq via ORAL
  Filled 2023-01-27: qty 2

## 2023-01-27 MED ORDER — RISPERIDONE 1 MG PO TABS: 1.0000 mg | ORAL_TABLET | Freq: Two times a day (BID) | ORAL | Status: DC

## 2023-01-27 MED ORDER — POTASSIUM CHLORIDE CRYS ER 20 MEQ PO TBCR: 40.0000 meq | EXTENDED_RELEASE_TABLET | Freq: Once | ORAL | Status: DC

## 2023-01-27 NOTE — ED Notes (Signed)
Pt rings also collected in a cup and put with pt belongings.

## 2023-01-27 NOTE — ED Triage Notes (Addendum)
Pt to ED via [facility] from [place]. EMS reports pt was found outside at truck stop laying on the ground. EMS unable to obtain history from pt. Pt responsive to voice but babbling. EMS reports receipt for delt 8 gummies at scene. Pt denies etoh or drug use on arrival. Pt rolling around in stretcher stating, "it hurts" but pt not telling this RN where. Pt then reports unlocking her house door and there was something in her food. Pt reports to EMS neck and back pain   Pt with hx bipolar and depression.

## 2023-01-27 NOTE — Consult Note (Signed)
Veronica Perez  Patient Name: Veronica Perez MRN: [id number] DOB: March 07, 1975 DATE OF Consult: 01/27/2023  PRIMARY PSYCHIATRIC DIAGNOSES  1.  Bipolar I disorder, current episode manic, severe, with psychotic features 2.  Rule out unspecified psychosis  3.  Rule out substance induced mood disorder/psychosis   RECOMMENDATIONS  Recommendations: Medication recommendations: Please reconcile and continue home medication. Until reconciliation occurs, start risperidone 1mg  po BID for psychosis/mood; Recommend hydroxyzine 50mg  po TID PRN anxiety; Recommend olanzapine 10mg  po TID PRN agitation Non-Medication/therapeutic recommendations: Psychiatric hospitalization  Is inpatient psychiatric hospitalization recommended for this patient? Yes (Explain why): Patient with history of bipolar disorder with psychotic features, noted to be rambling, disorganized, psychotic and unable to provide a viable plan for self care  Follow-Up Telepsychiatry C/L services: We will continue to follow this patient with you until stabilized or discharged.  If you have any questions or concerns, please call our TeleCare Coordination service at  [phone] and ask for myself or the provider on-call. Communication: Treatment team members (and family members if applicable) who were involved in treatment/care discussions and planning, and with whom we spoke or engaged with via secure text/chat, include the following: Dr. Dolores Frame    ASSESSMENT  Veronica Perez is a 47 year old female with a history of bipolar I disorder, severe, with psychotic features, PTSD, hypothyroidism who presents to the ED after reportedly being found outside a truck stop lying on the ground, unable to provide history, found with a receipt for delta 8 gummies. Chart reviewed, UDS positive for cannabis, ethanol negative psychiatry consulted for disposition recommendations.  On evaluation, patient cooperative, expansive, hyperverbal, rambling,  with flight of ideas, not appearing internally preoccupied, not responding to internal stimuli, alert and oriented x 4. Patient disorganized and speaking about people targeting her with carbon monoxide poisoning and belief that she is radioactive. Denies depressed mood, suicidal and homicidal ideation. Unable to obtain complete psychiatric review of systems due to patient condition. However, given patient's history and presentation, strongly suspect bipolar I disorder, current episode manic, severe, with psychotic features, possibly worsened by concomitant cannabis use.; rule out unspecified psychosis; rule out substance induced mood disorder/psychosis. Patient denies suicidal and homicidal ideation. However, patient at high risk for morbidity and mortality secondary to her decompensated state given psychosis, disorganization, impulsivity, poor insight and judgment. Therefore, inpatient psychiatric hospitalization is recommended.   Thank you for involving [place] in the care of this patient. If you have any additional questions or concerns, please call [phone] and ask for me or the provider on-call.  TELEPSYCHIATRY ATTESTATION & CONSENT  As the provider for this telehealth consult, I attest that I verified the patient's identity using two separate identifiers, introduced myself to the patient, provided my credentials, disclosed my location, and performed this encounter via a HIPAA-compliant, real-time, face-to-face, two-way, interactive audio and video platform and with the full consent and agreement of the patient (or guardian as applicable.)  Patient physical location: ED in [facility]  Telehealth provider physical location: home office in state of [place]   Video start time: 2248 EST Video end time: 2301 EST   IDENTIFYING DATA  Veronica Perez is a 47 y.o. year-old female for whom a psychiatric consultation has been ordered by the primary provider. The patient was identified using two  separate identifiers.  CHIEF COMPLAINT/REASON FOR CONSULT  Psychotic    HISTORY OF PRESENT ILLNESS (HPI)  Veronica Perez is a 47 year old female with a history of bipolar I disorder, severe, with  psychotic features, PTSD, hypothyroidism who presents to the ED after reportedly being found outside a truck stop lying on the ground, unable to provide history, found with a receipt for delta 8 gummies. Chart reviewed, UDS positive for cannabis, ethanol negative psychiatry consulted for disposition recommendations.   On evaluation, patient cooperative, expansive, hyperverbal, rambling, with flight of ideas, not appearing internally preoccupied, not responding to internal stimuli, alert and oriented x 4. Patient states, "Nausea and carbon monoxide poisoning, crazy water supply." Patient reports there is carbon monoxide poisoning in her neighborhood. Patient states, "You can smell it in the cold a lot better." Patient states, "I took care of my ignition." Patient reports she came to the ED in a "flashy flash ambulance."  Patient states there is no "no ventilation" where she lives. Patient reports she lives with her daughter. Patient states, "It went from boom to bomb, like sound wave machines, modified machines. It's worse than a boom car." Patient goes on to say, "Same old, same old different days." Patient reports belief that people are trying to harm her, unable to elaborate further, "I don't know who these people are. My fiance witnessed it." Patient states, "I have health issues that mimic mental health. I am radioactive. I feel things pinging me." She goes on to say, "I guess what they are spraying is causing the water to go radioactive." Patient reports she has not used cannabis or cannabis gummies recently. Patient endorses she rarely drinks alcohol. Denies use of meth, cocaine, heroin, fentanyl, hallucinogens. Patient states she takes medication for her mental health, does not know what she takes. States she  has a Therapist, sports. When inquire if she has been taking her medication she reports, "In my medicine bag." Denies depressed mood, suicidal and homicidal ideation. Unable to obtain complete psychiatric review of systems due to patient condition.   Per chart review, ED provider spoke to patient's daughter who reported patient has been having paranoid delusions, agitation and pressured speech.    PAST PSYCHIATRIC HISTORY  Current psych meds: Unable to assess due to patient condition  Prior psych meds: Unable to assess due to patient condition  Outpatient: Has a psychiatrist  Inpatient: Per chart review, yes Non-suicidal self injury: Denies  Suicide attempts: Unable to assess due to patient condition  Violence: Denies  Drugs/alcohol: Otherwise as per HPI above.  PAST MEDICAL HISTORY  Past Medical History:  Diagnosis Date   Bipolar disorder (HCC)    Depression    Former smoker, stopped smoking many years ago    HSV (herpes simplex virus) infection    Hypothyroid    Low TSH level 05/30/2016   Panic attacks    Thyroid disease      HOME MEDICATIONS  Facility Ordered Medications  Medication   [COMPLETED] ondansetron (ZOFRAN) injection 4 mg   PTA Medications  Medication Sig   Multiple Vitamins-Minerals (MULTIVITAMIN ADULTS) TABS Take 1 tablet by mouth every morning. (Patient not taking: Reported on 01/27/2023)   propranolol ER (INDERAL LA) 60 MG 24 hr capsule Take 1 capsule (60 mg total) by mouth daily.   chlorproMAZINE (THORAZINE) 10 MG tablet Take 1 tablet (10 mg total) by mouth every 8 (eight) hours.   risperiDONE (RISPERDAL) 3 MG tablet Take 1 tablet (3 mg total) by mouth 2 (two) times daily.   methimazole (TAPAZOLE) 10 MG tablet Take 1 tablet (10 mg total) by mouth daily.   divalproex (DEPAKOTE ER) 500 MG 24 hr tablet Take 3 tablets (1,500 mg total) by mouth at  bedtime.   fluticasone (FLONASE) 50 MCG/ACT nasal spray Place 1 spray into both nostrils daily. (Patient not taking:  Reported on 01/27/2023)   sodium chloride (OCEAN) 0.65 % SOLN nasal spray Place 1 spray into both nostrils as needed for congestion. (Patient not taking: Reported on 01/27/2023)   hydrocortisone cream 1 % Apply topically 3 (three) times daily. (Patient not taking: Reported on 01/27/2023)   medroxyPROGESTERone (DEPO-PROVERA) 150 MG/ML injection Inject 1 mL (150 mg total) into the muscle every 3 (three) months for 1 dose. Next dose is due on 10-05-22.     ALLERGIES  Allergies  Allergen Reactions   Penicillins Hives, Itching and Nausea And Vomiting    Has patient had a PCN reaction causing immediate rash, facial/tongue/throat swelling, SOB or lightheadedness with hypotension: No Has patient had a PCN reaction causing severe rash involving mucus membranes or skin necrosis: No Has patient had a PCN reaction that required hospitalization: No Has patient had a PCN reaction occurring within the last 10 years: No If all of the above answers are "NO", then may proceed with Cephalosporin use.   Iodine    Lactose Intolerance (Gi)    Penicillins Hives and Nausea And Vomiting    SOCIAL & SUBSTANCE USE HISTORY  Social History   Socioeconomic History   Marital status: Single    Spouse name: Not on file   Number of children: 1   Years of education: Not on file   Highest education level: High school graduate  Occupational History   Not on file  Tobacco Use   Smoking status: Every Day    Current packs/day: 0.25    Types: Cigarettes   Smokeless tobacco: Never  Vaping Use   Vaping status: Never Used  Substance and Sexual Activity   Alcohol use: Not Currently    Comment: denies   Drug use: No   Sexual activity: Yes    Birth control/protection: None  Other Topics Concern   Not on file  Social History Narrative   Not on file   Social Drivers of Health   Financial Resource Strain: Not on file  Food Insecurity: Patient Declined (06/24/2022)   Hunger Vital Sign    Worried About Running Out of  Food in the Last Year: Patient declined    Ran Out of Food in the Last Year: Patient declined  Transportation Needs: Patient Declined (06/24/2022)   PRAPARE - Administrator, Civil Service (Medical): Patient declined    Lack of Transportation (Non-Medical): Patient declined  Physical Activity: Not on file  Stress: Not on file  Social Connections: Unknown (10/29/2022)   Received from [employer]   Social Network    Social Network: Not on file   Social History   Tobacco Use  Smoking Status Every Day   Current packs/day: 0.25   Types: Cigarettes  Smokeless Tobacco Never   Social History   Substance and Sexual Activity  Alcohol Use Not Currently   Comment: denies   Social History   Substance and Sexual Activity  Drug Use No     FAMILY HISTORY  Family History  Problem Relation Age of Onset   Diabetes Father    Heart disease Father    Cancer Maternal Grandmother        breast cancer   Bipolar disorder Mother    Thyroid disease Neg Hx      MENTAL STATUS EXAM (MSE)  Mental Status Exam: General Appearance: Disheveled  Orientation:  Full (Time, Place, and Person)  Memory:  Recent;   Poor  Concentration:  Concentration: Poor  Recall:  Poor  Attention  Poor  Eye Contact:  Fair  Speech:  Pressured  Language:  Good  Volume:  Normal  Mood: "I feel good"  Affect:   Expansive   Thought Process:  Disorganized  Thought Content:  Delusions  Suicidal Thoughts:  No  Homicidal Thoughts:  No  Judgement:  Poor  Insight:  Lacking  Psychomotor Activity:  Normal  Akathisia:  Negative  Fund of Knowledge:   Unable to assess due to patient condition     Assets:  Communication Skills  Cognition:  WNL  ADL's:  Intact  AIMS (if indicated):       VITALS  Blood pressure 106/67, pulse (!) 115, temperature 98 F (36.7 C), temperature source Oral, resp. rate 18, SpO2 97%.  LABS  Admission on 01/27/2023  Component Date Value Ref Range Status   WBC 01/27/2023 8.1  4.0  - 10.5 K/uL Final   RBC 01/27/2023 4.43  3.87 - 5.11 MIL/uL Final   Hemoglobin 01/27/2023 11.9 (L)  12.0 - 15.0 g/dL Final   HCT 29/52/8413 35.4 (L)  36.0 - 46.0 % Final   MCV 01/27/2023 79.9 (L)  80.0 - 100.0 fL Final   MCH 01/27/2023 26.9  26.0 - 34.0 pg Final   MCHC 01/27/2023 33.6  30.0 - 36.0 g/dL Final   RDW 24/40/1027 13.0  11.5 - 15.5 % Final   Platelets 01/27/2023 270  150 - 400 K/uL Final   nRBC 01/27/2023 0.0  0.0 - 0.2 % Final   Performed at [facility], [address] [place], [place] [address]   Preg Test, Ur 01/27/2023 Negative  Negative Final   Alcohol, Ethyl (B) 01/27/2023 <10  <10 mg/dL Final   Comment: (Perez) Lowest detectable limit for serum alcohol is 10 mg/dL.  For medical purposes only. Performed at [facility], [address] [place], [place] [address]    Color, Urine 01/27/2023 YELLOW (A)  YELLOW Final   APPearance 01/27/2023 HAZY (A)  CLEAR Final   Specific Gravity, Urine 01/27/2023 1.019  1.005 - 1.030 Final   pH 01/27/2023 5.0  5.0 - 8.0 Final   Glucose, UA 01/27/2023 NEGATIVE  NEGATIVE mg/dL Final   Hgb urine dipstick 01/27/2023 MODERATE (A)  NEGATIVE Final   Bilirubin Urine 01/27/2023 NEGATIVE  NEGATIVE Final   Ketones, ur 01/27/2023 80 (A)  NEGATIVE mg/dL Final   Protein, ur 74/25/9563 30 (A)  NEGATIVE mg/dL Final   Nitrite 87/56/4332 NEGATIVE  NEGATIVE Final   Leukocytes,Ua 01/27/2023 SMALL (A)  NEGATIVE Final   RBC / HPF 01/27/2023 0-5  0 - 5 RBC/hpf Final   WBC, UA 01/27/2023 6-10  0 - 5 WBC/hpf Final   Bacteria, UA 01/27/2023 MANY (A)  NONE SEEN Final   Squamous Epithelial / HPF 01/27/2023 0-5  0 - 5 /HPF Final   Mucus 01/27/2023 PRESENT   Final   Hyaline Casts, UA 01/27/2023 PRESENT   Final   Performed at [facility], [address] [place], [place] [address]   Sodium 01/27/2023 135  135 - 145 mmol/L Final   Potassium 01/27/2023 3.1 (L)  3.5 - 5.1 mmol/L Final   Chloride 01/27/2023 100  98 - 111 mmol/L  Final   CO2 01/27/2023 17 (L)  22 - 32 mmol/L Final   Glucose, Bld 01/27/2023 73  70 - 99 mg/dL Final   Glucose reference range applies only to samples taken after fasting  for at least 8 hours.   BUN 01/27/2023 13  6 - 20 mg/dL Final   Creatinine, Ser 01/27/2023 0.64  0.44 - 1.00 mg/dL Final   Calcium 91/47/8295 8.5 (L)  8.9 - 10.3 mg/dL Final   Total Protein 62/13/0865 6.6  6.5 - 8.1 g/dL Final   Albumin 78/46/9629 3.4 (L)  3.5 - 5.0 g/dL Final   AST 52/84/1324 34  15 - 41 U/L Final   ALT 01/27/2023 50 (H)  0 - 44 U/L Final   Alkaline Phosphatase 01/27/2023 57  38 - 126 U/L Final   Total Bilirubin 01/27/2023 1.2 (H)  <1.2 mg/dL Final   GFR, Estimated 01/27/2023 >60  >60 mL/min Final   Comment: (Perez) Calculated using the CKD-EPI Creatinine Equation (2021)    Anion gap 01/27/2023 18 (H)  5 - 15 Final   Performed at [facility], [address] [place], [place] [address]   hCG, Conley Rolls, Quant, S 01/27/2023 <1  <5 mIU/mL Final   Comment:          GEST. AGE      CONC.  (mIU/mL)   <=1 WEEK        5 - 50     2 WEEKS       50 - 500     3 WEEKS       100 - 10,000     4 WEEKS     1,000 - 30,000     5 WEEKS     3,500 - 115,000   6-8 WEEKS     12,000 - 270,000    12 WEEKS     15,000 - 220,000        FEMALE AND NON-PREGNANT FEMALE:     LESS THAN 5 mIU/mL Performed at [facility], [address] [place], [place] [address]    Lipase 01/27/2023 26  11 - 51 U/L Final   Performed at [facility], [address] [place], [place] [address]   Tricyclic, Ur Screen 01/27/2023 NONE DETECTED  NONE DETECTED Final   Amphetamines, Ur Screen 01/27/2023 NONE DETECTED  NONE DETECTED Final   MDMA (Ecstasy)Ur Screen 01/27/2023 NONE DETECTED  NONE DETECTED Final   Cocaine Metabolite,Ur [place] 01/27/2023 NONE DETECTED  NONE DETECTED Final   Opiate, Ur Screen 01/27/2023 NONE DETECTED  NONE DETECTED Final   Phencyclidine (PCP) Ur S 01/27/2023 NONE DETECTED  NONE DETECTED Final    Cannabinoid 50 Ng, Ur [place] 01/27/2023 POSITIVE (A)  NONE DETECTED Final   Barbiturates, Ur Screen 01/27/2023 NONE DETECTED  NONE DETECTED Final   Benzodiazepine, Ur Scrn 01/27/2023 NONE DETECTED  NONE DETECTED Final   Methadone Scn, Ur 01/27/2023 NONE DETECTED  NONE DETECTED Final   Comment: (Perez) Tricyclics + metabolites, urine    Cutoff 1000 ng/mL Amphetamines + metabolites, urine  Cutoff 1000 ng/mL MDMA (Ecstasy), urine              Cutoff 500 ng/mL Cocaine Metabolite, urine          Cutoff 300 ng/mL Opiate + metabolites, urine        Cutoff 300 ng/mL Phencyclidine (PCP), urine         Cutoff 25 ng/mL Cannabinoid, urine                 Cutoff 50 ng/mL Barbiturates + metabolites, urine  Cutoff 200 ng/mL Benzodiazepine, urine              Cutoff 200 ng/mL Methadone, urine  Cutoff 300 ng/mL  The urine drug screen provides only a preliminary, unconfirmed analytical test result and should not be used for non-medical purposes. Clinical consideration and professional judgment should be applied to any positive drug screen result due to possible interfering substances. A more specific alternate chemical method must be used in order to obtain a confirmed analytical result. Gas chromatography / mass spectrometry (GC/MS) is the preferred confirm                          atory method. Performed at [facility], [address] [place], [place] [address]    Preg Test, Ur 01/27/2023 NEGATIVE  NEGATIVE Final   Comment:        THE SENSITIVITY OF THIS METHODOLOGY IS >24 mIU/mL     PSYCHIATRIC REVIEW OF SYSTEMS (ROS)  ROS: Notable for the following relevant positive findings: Review of Systems  Psychiatric/Behavioral: Negative.      Additional findings:      Musculoskeletal: No abnormal movements observed      Gait & Station: Laying/Sitting      Pain Screening: Denies      Nutrition & Dental Concerns: None   RISK FORMULATION/ASSESSMENT  Is the patient  experiencing any suicidal or homicidal ideations: No      Protective factors considered for safety management: Family, social support, future oriented   Risk factors/concerns considered for safety management:  Impulsivity Psychosis  Is there a safety management plan with the patient and treatment team to minimize risk factors and promote protective factors: Yes           Explain: Psychiatric hospitalization  Is crisis care placement or psychiatric hospitalization recommended: Yes     Based on my current evaluation and risk assessment, patient is determined at this time to be at:  High risk  *RISK ASSESSMENT Risk assessment is a dynamic process; it is possible that this patient's condition, and risk level, may change. This should be re-evaluated and managed over time as appropriate. Please re-consult psychiatric consult services if additional assistance is needed in terms of risk assessment and management. If your team decides to discharge this patient, please advise the patient how to best access emergency psychiatric services, or to call 911, if their condition worsens or they feel unsafe in any way.   Adria Dill, MD Telepsychiatry Consult Services

## 2023-01-27 NOTE — Consult Note (Signed)
Psych consult was entered @1118 .  Per record review, ED provider was unable to complete ROS d/t patient being intoxicated.   Reached out to Dr. Vicente Males to see if patient was medically cleared and able to participate in psych assessment.  He reports patient is improving but will require at least 1-2 more hours to metabolize cannabinoids before she can participate in a prolonged discussion.   Considering above, psychiatry will plan to evaluate patient when she can participate.

## 2023-01-27 NOTE — BH Assessment (Signed)
 Per [facility] Everardo Pacific), patient to be referred out of system.  Referral information for Psychiatric Hospitalization faxed to;   [facility] [phone]- [phone]) No available beds  [facility] [phone]- [phone]),   [facility] [phone]),  [address] [phone]),   [facility] [phone] -or- [phone]),   [facility] [phone])  [facility] [phone])

## 2023-01-27 NOTE — ED Notes (Signed)
Pt placed on bedpan for urine sample and POC

## 2023-01-27 NOTE — BH Assessment (Signed)
Psych Team unable to interview patient at this time. Patient will need to metabolize in order to complete adequate assessment.

## 2023-01-27 NOTE — ED Provider Notes (Signed)
[facility] Provider Note   Event Date/Time   First MD Initiated Contact with Patient 01/27/23 [phone]     (approximate) History  Altered Mental Status  HPI Veronica Perez is a 47 y.o. female with listed past medical history of thyroid disease, bipolar disorder, panic attacks, HSV, and morbid obesity who presents via EMS after she was found down at a truck stop.  Of the very limited history that I can obtain from the patient, she states she has been having vomiting and lower abdominal pain over the last 3 days.  Patient is also complaining that there are products in her house that are dirty that causing her to feel sick.  Patient cannot elaborate on what these substances may be.  EMS does report patient was found with receipt for delta 8 Gummies on scene.  Patient denies any alcohol or drug use. ROS: Unable to assess   Physical Exam  Triage Vital Signs: ED Triage Vitals [01/27/23 0751]  Encounter Vitals Group     BP 137/78     Systolic BP Percentile      Diastolic BP Percentile      Pulse Rate (!) 106     Resp 16     Temp 97.7 F (36.5 C)     Temp Source Axillary     SpO2 100 %     Weight      Height      Head Circumference      Peak Flow      Pain Score      Pain Loc      Pain Education      Exclude from Growth Chart    Most recent vital signs: Vitals:   01/27/23 0915 01/27/23 1030  BP:  120/62  Pulse: (!) 109 (!) 104  Resp: 19 20  Temp:    SpO2: 100% 100%   General: Awake, disoriented, cooperative CV:  Good peripheral perfusion.  Resp:  Normal effort.  Abd:  No distention.  Mild tenderness palpation of bilateral lower abdominal quadrants Other:  Middle-aged morbidly obese Caucasian female resting comfortably in no acute distress ED Results / Procedures / Treatments  Labs (all labs ordered are listed, but only abnormal results are displayed) Labs Reviewed  CBC - Abnormal; Notable for the following components:      Result Value    Hemoglobin 11.9 (*)    HCT 35.4 (*)    MCV 79.9 (*)    All other components within normal limits  URINALYSIS, ROUTINE W REFLEX MICROSCOPIC - Abnormal; Notable for the following components:   Color, Urine YELLOW (*)    APPearance HAZY (*)    Hgb urine dipstick MODERATE (*)    Ketones, ur 80 (*)    Protein, ur 30 (*)    Leukocytes,Ua SMALL (*)    Bacteria, UA MANY (*)    All other components within normal limits  COMPREHENSIVE METABOLIC PANEL - Abnormal; Notable for the following components:   Potassium 3.1 (*)    CO2 17 (*)    Calcium 8.5 (*)    Albumin 3.4 (*)    ALT 50 (*)    Total Bilirubin 1.2 (*)    Anion gap 18 (*)    All other components within normal limits  URINE DRUG SCREEN, QUALITATIVE ([facility] ONLY) - Abnormal; Notable for the following components:   Cannabinoid 50 Ng, Ur [place] POSITIVE (*)    All other components within normal limits  ETHANOL  HCG, QUANTITATIVE,  PREGNANCY  LIPASE, BLOOD  POC URINE PREG, ED  CBG MONITORING, ED   EKG ED ECG REPORT I, Merwyn Katos, the attending physician, personally viewed and interpreted this ECG. Date: 01/27/2023 EKG Time: 0750 Rate: 103 Rhythm: Tachycardic sinus rhythm QRS Axis: normal Intervals: normal ST/T Wave abnormalities: normal Narrative Interpretation: Tachycardic sinus rhythm.  No evidence of acute ischemia RADIOLOGY ED MD interpretation: CT of the head without contrast interpreted by me shows no evidence of acute abnormalities including no intracerebral hemorrhage, obvious masses, or significant edema  CT of the abdomen and pelvis without IV contrast shows a new hypodense lesion in the right lobe of the liver as well as gallbladder sludge, right adnexal adenoma, and a cystic mass in the right adnexa of which a neoplastic process cannot be excluded -Agree with radiology assessment Official radiology report(s): CT ABDOMEN PELVIS WO CONTRAST Result Date: 01/27/2023 CLINICAL DATA:  Abdominal pain. EXAM: CT ABDOMEN AND  PELVIS WITHOUT CONTRAST TECHNIQUE: Multidetector CT imaging of the abdomen and pelvis was performed following the standard protocol without IV contrast. RADIATION DOSE REDUCTION: This exam was performed according to the departmental dose-optimization program which includes automated exposure control, adjustment of the mA and/or kV according to patient size and/or use of iterative reconstruction technique. COMPARISON:  11/27/2022. FINDINGS: Lower chest: Lung bases clear.  No pericardial or pleural effusion. Hepatobiliary: New hypodense lesion in the medial right lobe of the liver measuring 2.9 cm. Further evaluation with liver protocol CT or MRI recommended for this new finding. Gallbladder density consistent with sludge. Pancreas: Unremarkable. No pancreatic ductal dilatation or surrounding inflammatory changes. Spleen: Normal in size without focal abnormality. Adrenals/Urinary Tract: Low-attenuation 1.8 cm lesion in the right adrenal gland consistent with an adenoma. No nephrolithiasis or hydronephrosis. Unremarkable urinary bladder. Stomach/Bowel: Stomach is within normal limits. Appendix appears normal. No evidence of bowel wall thickening, distention, or inflammatory changes. Vascular/Lymphatic: No significant vascular findings are present. No enlarged abdominal or pelvic lymph nodes. Reproductive: Cystic mass right adnexa measures 7.8 x 5.2 x 4.9 cm. This represents an interval change from the prior study. A neoplastic process can not be excluded. There is a bulky appearance of the uterus without focal lesions that can be appreciated on noncontrast CT. Other: No abdominal wall hernia or abnormality. No abdominopelvic ascites. Musculoskeletal: No acute or significant osseous findings. IMPRESSION: 1. New hypodense lesion in the right lobe of the liver. Further evaluation recommended. 2. Gallbladder sludge. 3. Right adrenal adenoma. 4. Cystic mass right adnexa. Neoplastic process can not be excluded. Pelvic  ultrasound recommended as first step in further evaluation of this finding. Electronically Signed   By: Layla Maw M.D.   On: 01/27/2023 10:05   CT Head Wo Contrast Result Date: 01/27/2023 CLINICAL DATA:  ams EXAM: CT HEAD WITHOUT CONTRAST TECHNIQUE: Contiguous axial images were obtained from the base of the skull through the vertex without intravenous contrast. RADIATION DOSE REDUCTION: This exam was performed according to the departmental dose-optimization program which includes automated exposure control, adjustment of the mA and/or kV according to patient size and/or use of iterative reconstruction technique. COMPARISON:  Brain MR 11/07/20, Head CT 11/06/20 FINDINGS: Brain: No evidence of acute infarction, hemorrhage, hydrocephalus, extra-axial collection or mass lesion/mass effect. Vascular: No hyperdense vessel or unexpected calcification. Skull: Normal. Negative for fracture or focal lesion. Sinuses/Orbits: No middle ear or mastoid effusion. Paranasal sinuses are clear. Orbits are unremarkable. Other: None. IMPRESSION: No acute intracranial abnormality. Electronically Signed   By: Elige Radon.D.  On: 01/27/2023 09:17   PROCEDURES: Critical Care performed: No Procedures MEDICATIONS ORDERED IN ED: Medications  ondansetron (ZOFRAN) injection 4 mg (has no administration in time range)   IMPRESSION / MDM / ASSESSMENT AND PLAN / ED COURSE  I reviewed the triage vital signs and the nursing notes.                             The patient is on the cardiac monitor to evaluate for evidence of arrhythmia and/or significant heart rate changes. Patient's presentation is most consistent with acute presentation with potential threat to life or bodily function. 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 Thoughts are disorganized. No history of prior suicide attempt, and no SI or HI at this  time. Clinically w/ no overt toxidrome, low suspicion for ingestion given hx and exam Thoughts unlikely 2/2 anemia, hypothyroidism, infection, or ICH. Patient's decision making capacity is compromised and they are unable to perform all ADL's (additionally they are without appropriate caretakers to assist through this deficit).   I spoke with patient's family at length including her mother and daughter who both state that patient has been "acting off" over the last 2 weeks after reportedly coloring her hair.  Patient's daughter states that she has been increasingly agitated, with rapid and incoherent speech as well as becoming increasingly agitated that her hair dye is causing her to become altered.  Patient's daughter also admits that patient occasionally uses THC when she is having a "psychotic episode" in an attempt to self medicate despite the patient knowing that it can make her more psychotic.   Consult: Psychiatry to evaluate patient for grave disability Disposition: Pending psychiatric evaluation  Care of this patient will be signed out the oncoming physician.  All pertinent patient formation is conveyed and all questions answered.  All further care and disposition decisions will be made by the oncoming physician.   FINAL CLINICAL IMPRESSION(S) / ED DIAGNOSES   Final diagnoses:  Altered mental status, unspecified altered mental status type  Unintentional poisoning by synthetic cannabinoid, initial encounter   Rx / DC Orders   ED Discharge Orders     None      Note:  This document was prepared using Dragon voice recognition software and may include unintentional dictation errors.   Merwyn Katos, MD 01/27/23 1425

## 2023-01-27 NOTE — ED Notes (Signed)
This tech and RN [employer] dressed pt out. Pt was not wearing pants at the time. White tshirt, red bra and grey socks were put in a pt belongings bag, and put with her laundry basket and other belongings.

## 2023-01-27 NOTE — ED Notes (Signed)
IVC PENDING  CONSULT ?

## 2023-01-27 NOTE — ED Provider Notes (Signed)
Procedures     ----------------------------------------- 4:58 PM on 01/27/2023 -----------------------------------------  Labs reassuring.  Medically stable to proceed with psychiatry disposition.   Sharman Cheek, MD 01/27/23 [phone]

## 2023-01-27 NOTE — BH Assessment (Addendum)
Comprehensive Clinical Assessment (CCA) Screening, Triage and Referral Note  01/27/2023 Veronica Perez [id number]  Chief Complaint: Patient is a 47 year old female presenting to [facility] ED under IVC. Per triage note Pt to ED via [facility] from truck stop. EMS reports pt was found outside at truck stop laying on the ground. EMS unable to obtain history from pt. Pt responsive to voice but babbling. EMS reports receipt for delt 8 gummies at scene. Pt denies etoh or drug use on arrival. Pt rolling around in stretcher stating, "it hurts" but pt not telling this RN where. Pt then reports unlocking her house door and there was something in her food. Pt reports to EMS neck and back pain. Pt with hx bipolar and depression. During assessment patient appears alert and oriented x3, calm and cooperative with disorganized thoughts, patient often rambles during the assessment and finds it difficult to stay on topic. When asked if patient understands why she is presenting to the ED she reports "Carbon Monoxide Poisoning and [employer], there's carbon monoxide in my home." When asked how long she feels like there is carbon monoxide in her home she reports "I have a filter but it's nasty, I don't have the strength to clean my house, those headphones are killin my plants." Patient is able to report that she lives with her 40 year old daughter and that she has a psychiatrist with "[facility]", she reports that she takes her medications daily. When asked about any substance use, patient denies. In regards to her sleep she reports "I don't sleep, I get bombed to death." Patient denies SI/HI/AH/VH.  Per Psyc MD Wandra Feinstein patient is recommended for Inpatient Chief Complaint  Patient presents with   Altered Mental Status   Visit Diagnosis: Bipolar 1 disorder with psychotic behavior per history  Patient Reported Information How did you hear about [place]? Legal System  What Is the Reason for Your  Visit/Call Today? Pt to ED via [facility] from truck stop. EMS reports pt was found outside at truck stop laying on the ground. EMS unable to obtain history from pt. Pt responsive to voice but babbling. EMS reports receipt for delt 8 gummies at scene. Pt denies etoh or drug use on arrival. Pt rolling around in stretcher stating, "it hurts" but pt not telling this RN where. Pt then reports unlocking her house door and there was something in her food. Pt reports to EMS neck and back pain      Pt with hx bipolar and depression.  How Long Has This Been Causing You Problems? > than 6 months  What Do You Feel Would Help You the Most Today? Treatment for Depression or other mood problem; Stress Management   Have You Recently Had Any Thoughts About Hurting Yourself? No  Are You Planning to Commit Suicide/Harm Yourself At This time? No   Have you Recently Had Thoughts About Hurting Someone Karolee Ohs? No  Are You Planning to Harm Someone at This Time? No  Explanation: Pt denies any SI or HI   Have You Used Any Alcohol or Drugs in the Past 24 Hours? No  How Long Ago Did You Use Drugs or Alcohol? No data recorded What Did You Use and How Much? Pt denies   Do You Currently Have a Therapist/Psychiatrist? Yes  Name of Therapist/Psychiatrist: Psychiatrist  Dr. Betti Cruz at [facility]   Have You Been Recently Discharged From Any Office Practice or Programs? No  Explanation of Discharge From Practice/Program: No  recent inpatient care.    CCA Screening Triage Referral Assessment Type of Contact: Face-to-Face  Telemedicine Service Delivery:   Is this Initial or Reassessment?   Date Telepsych consult ordered in [facility]:    Time Telepsych consult ordered in [facility]:    Location of Assessment: [facility] ED  Provider Location: [facility] ED    Collateral Involvement: N/A   Does Patient Have a Court Appointed Legal Guardian? No data recorded Name and Contact of Legal Guardian: No data recorded If Minor and Not  Living with Parent(s), Who has Custody? Pt is an adult  Is CPS involved or ever been involved? Never  Is APS involved or ever been involved? Never   Patient Determined To Be At Risk for Harm To Self or Others Based on Review of Patient Reported Information or Presenting Complaint? No  Method: No Plan  Availability of Means: No access or NA  Intent: Vague intent or NA  Notification Required: No need or identified person  Additional Information for Danger to Others Potential: Active psychosis  Additional Comments for Danger to Others Potential: No danger to others  Are There Guns or Other Weapons in Your Home? No  Types of Guns/Weapons: No guns or weapons  Are These Weapons Safely Secured?                            No  Who Could Verify You Are Able To Have These Secured: No weapons to secure  Do You Have any Outstanding Charges, Pending Court Dates, Parole/Probation? No charges  Contacted To Inform of Risk of Harm To Self or Others: Other: Comment (No SI or HI.)   Does Patient Present under Involuntary Commitment? Yes    [place] of Residence: [place]   Patient Currently Receiving the Following Services: Medication Management   Determination of Need: Emergent (2 hours)   Options For Referral: Inpatient Hospitalization   Disposition Recommendation per psychiatric provider: Inpatient Hospitalization  Zafirah Vanzee A Jerilee Space, LCAS-A

## 2023-01-27 NOTE — ED Notes (Signed)
Patient had accident on self- able to independently clean self up. Bath wipes and new scrubs provided.

## 2023-01-27 NOTE — ED Notes (Addendum)
Pt back from CT. Repeat bloodwork obtained. Lab called to put bloodwork in process

## 2023-01-28 DIAGNOSIS — T40721A Poisoning by synthetic cannabinoids, accidental (unintentional), initial encounter: Secondary | ICD-10-CM | POA: Diagnosis not present

## 2023-01-28 MED ORDER — DIVALPROEX SODIUM 500 MG PO DR TAB
500.0000 mg | DELAYED_RELEASE_TABLET | Freq: Two times a day (BID) | ORAL | Status: DC
  Filled 2023-01-28: qty 1
  Filled 2023-01-28: qty 2
  Filled 2023-01-28 (×2): qty 1

## 2023-01-28 MED ORDER — RISPERIDONE 1 MG PO TABS
2.0000 mg | ORAL_TABLET | Freq: Two times a day (BID) | ORAL | Status: DC
  Filled 2023-01-28 (×3): qty 2

## 2023-01-28 MED ORDER — METHIMAZOLE 10 MG PO TABS
10.0000 mg | ORAL_TABLET | Freq: Every day | ORAL | Status: DC
  Filled 2023-01-28 (×3): qty 1

## 2023-01-28 MED ORDER — PROPRANOLOL HCL ER 60 MG PO CP24
60.0000 mg | ORAL_CAPSULE | Freq: Every day | ORAL | Status: DC
  Filled 2023-01-28 (×3): qty 1

## 2023-01-28 NOTE — ED Notes (Incomplete)
Pt in her room yelling, went to pts room she ask for paper towel, provided and cleaned up all the cups from her room. PT states she is allergic to hand sanitizer. . Shut the pts doo

## 2023-01-28 NOTE — ED Notes (Signed)
IVC/ Moved to [facility]04

## 2023-01-28 NOTE — ED Notes (Signed)
PT is heard yelling from her room stating she is educated pt is now in the bathroom opening and shutting the door. Pt then yelled she needs her privacy and she can check herself out she is an adult.

## 2023-01-28 NOTE — ED Notes (Signed)
IVC patient accepted to [facility], no transport 12/25  to be transferred 12/26 if transport available

## 2023-01-28 NOTE — ED Notes (Signed)
PT IVC PENDING CONSULT  

## 2023-01-28 NOTE — Consult Note (Signed)
Attempted to assess the client who has a bed at [facility], awaiting transport, to verify her medications.  She stated, "I only see one doctor and he cleared me from everything!"  Medications restarted based on recent appointments, admissions, home medication verification, and notes.  Nanine Means PMHNP

## 2023-01-28 NOTE — ED Provider Notes (Signed)
Emergency Medicine Observation Re-evaluation Note  Veronica Perez is a 47 y.o. female, seen on rounds today.  Pt initially presented to the ED for complaints of Altered Mental Status Currently, the patient is resting, voices no medical complaints.  Physical Exam  BP 106/67   Pulse (!) 115   Temp 98 F (36.7 C) (Oral)   Resp 18   SpO2 97%  Physical Exam General: Resting in no acute distress Cardiac: No cyanosis Lungs: Equal rise and fall Psych: Not agitated  ED Course / MDM  EKG:EKG Interpretation Date/Time:  Tuesday January 27 2023 07:50:51 EST Ventricular Rate:  103 PR Interval:  170 QRS Duration:  93 QT Interval:  401 QTC Calculation: 525 R Axis:   77  Text Interpretation: Sinus tachycardia Posterior infarct, old Prolonged QT interval Confirmed by UNCONFIRMED, DOCTOR ([id number]), editor Lonell Face [phone]) on 01/27/2023 8:00:57 AM  I have reviewed the labs performed to date as well as medications administered while in observation.  Recent changes in the last 24 hours include no events overnight.  Plan  Current plan is for psychiatric disposition.    Irean Hong, MD 01/28/23 [phone]

## 2023-01-28 NOTE — ED Notes (Addendum)
Patient coming out of the room stating "oh God that purell, its too strong its making me so sick close the nozzle". Patient continuously talking about the hand sanitizer bottle that is not open nor can anyone else smell. Patient is very paranoid about the hand sanitizer bottle and it affecting her and everyone elses breathing. PO Zyprexa given at this time.

## 2023-01-28 NOTE — ED Notes (Signed)
Patient given dinner tray. Upon entering room patient has shirt looped through neck and pulled back through to make belly shirt. Patient asked to fix her shirt and she started throwing herself around the bed and covered up with blanket.

## 2023-01-28 NOTE — BH Assessment (Signed)
PATIENT BED AVAILABLE AFTER 9AM ON 01/28/23  Patient has been accepted to [facility].  Patient [facility] Accepting physician is Dr. Roselyn Reef.  Call report to [phone].  Representative was [employer].   ER Staff is aware of it:  [facility] ER Secretary  Dr. Dolores Frame, ER MD  Pattricia Boss Patient's Nurse     If there is no transportation available due to the holiday please contact the facility to let them know

## 2023-01-28 NOTE — ED Notes (Signed)
Patient observed loudly RTIS. Patient redirected. Scheduled medication offered and patient yelled " I know my rights." Declined all medication. Concerned with poisoning.

## 2023-01-28 NOTE — ED Notes (Signed)
Pt given water at this time 

## 2023-01-29 DIAGNOSIS — T40721A Poisoning by synthetic cannabinoids, accidental (unintentional), initial encounter: Secondary | ICD-10-CM | POA: Diagnosis not present

## 2023-01-29 LAB — CBG MONITORING, ED: Glucose-Capillary: 98 mg/dL (ref 70–99)

## 2023-01-29 NOTE — ED Notes (Signed)
Attempted to obtain vitals from patient, Veronica Perez continues to decline and begins yelling,  staff walked away allowing pt to self sooth

## 2023-01-29 NOTE — ED Notes (Addendum)
Attempted to call report to [facility] @ [phone], no answer, unable to leave message

## 2023-01-29 NOTE — ED Notes (Signed)
Pt provided with breakfast tray. Pt still asleep and doesn't want to get up.

## 2023-01-29 NOTE — ED Notes (Signed)
[facility]  DEPT  CALLED  FOR  TRANSPORT  TO  [place]

## 2023-01-29 NOTE — ED Notes (Signed)
IVC Pt going to [facility] in the am  01/30/23

## 2023-01-29 NOTE — BH Assessment (Signed)
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 Veronica Perez will hold the bed for tomorrow (01/30/2023).

## 2023-01-29 NOTE — ED Notes (Signed)
Pt continues to sleep, when woken for medication or meals Veronica Perez begins yelling for staff to "leave me alone"  Staff allowing pt to sleep and use self coping skills.  Pt ready for transport.

## 2023-01-29 NOTE — ED Notes (Signed)
Lunch tray provided. 

## 2023-01-29 NOTE — ED Notes (Signed)
PT IVC/ PT ACCEPTED TO [facility] SPOKE WITH REP.SHANDRA SHE WAS UNABLE TO TELL ME IF THE PT.'S BED WAS STILL BEING HELD.

## 2023-01-29 NOTE — ED Provider Notes (Signed)
Emergency Medicine Observation Re-evaluation Note  Veronica Perez is a 47 y.o. female, seen on rounds today.  Pt initially presented to the ED for complaints of Altered Mental Status    Physical Exam  BP (!) 140/84   Pulse (!) 113   Temp 98.2 F (36.8 C) (Oral)   Resp 18   SpO2 96%  Physical Exam General: nad  ED Course / MDM  EKG:EKG Interpretation Date/Time:  Tuesday January 27 2023 07:50:51 EST Ventricular Rate:  103 PR Interval:  170 QRS Duration:  93 QT Interval:  401 QTC Calculation: 525 R Axis:   77  Text Interpretation: Sinus tachycardia Posterior infarct, old Prolonged QT interval Confirmed by UNCONFIRMED, DOCTOR ([id number]), editor Lonell Face [phone]) on 01/27/2023 8:00:57 AM  I have reviewed the labs performed to date as well as medications administered while in observation.    Plan  Current plan is for psych.    Willy Eddy, MD 01/29/23 [phone]

## 2023-01-29 NOTE — ED Notes (Addendum)
Called [facility] admission line @ [phone] to reach someone to give report to, admissions transferred call to [facility], report given to Christie Beckers, RN

## 2023-01-29 NOTE — ED Notes (Signed)
This tech provided pt with dinner tray. Pt asleep at this time, this tech left tray on the bed.

## 2023-01-29 NOTE — ED Notes (Signed)
Pt refused vitals 

## 2023-01-29 NOTE — ED Notes (Signed)
Pt continues to refuse all medication(s), Inpatient facility was notified of this during report

## 2023-01-30 DIAGNOSIS — T40721A Poisoning by synthetic cannabinoids, accidental (unintentional), initial encounter: Secondary | ICD-10-CM | POA: Diagnosis not present

## 2023-01-30 NOTE — ED Notes (Signed)
Pt dc to [facility] with [facility], all belongings sent with transport

## 2023-01-30 NOTE — ED Notes (Signed)
Called for [facility]'s transport to [facility] will transport after other transport [phone]

## 2023-01-30 NOTE — ED Notes (Signed)
RN observed pt sitting in bed moving objects around on video camera.  RN entered room to see what patient was doing.  Pt had cookies, chips, and pretzels on bed, some of which were partially eaten.  RN asked pt to stand so we could clean her bed, pt placed food on pillow, stood up, and RN and Tech were able to clean and put linens on bed.    Pt stated she needed to use the restroom.  RN and tech assisted pt to restroom, RN waited outside, with door partially open.  Tech noted menorrhea, RN obtained hygiene pad, new pants shirt underwear and socks.  Tech was able to help pt change clothes and apply pad.    Pt was then assisted back to her bed, light was left on in pt room per pt request.

## 2023-01-30 NOTE — ED Provider Notes (Signed)
Emergency Medicine Observation Re-evaluation Note  Veronica Perez is a 47 y.o. female, seen on rounds today.  Pt initially presented to the ED for complaints of Altered Mental Status  Currently, the patient is calm, no acute complaints.  Physical Exam  Blood pressure (!) 164/84, pulse (!) 114, temperature 99.2 F (37.3 C), temperature source Oral, resp. rate 20, SpO2 96%. Physical Exam General: NAD Lungs: CTAB Psych: not agitated  ED Course / MDM  EKG:    I have reviewed the labs performed to date as well as medications administered while in observation.  Recent changes in the last 24 hours include no acute events overnight.    Plan  Current plan is for inpatient psych. Patient is under full IVC at this time.   Sharman Cheek, MD 01/30/23 [phone]

## 2023-01-30 NOTE — ED Notes (Signed)
Patient denies pain and is resting comfortably.  

## 2023-01-30 NOTE — ED Notes (Signed)
Pt refused all scheduled medication, was agreeable to take PRN medication for anxiety.  Pt clearly responding to auditory as well as visual hallucinations. Ms Weekes was wanting to communicate with staff by whispering into their ear and was motioning towards the corner of the room and stated "they are scaring me, don't let them hear. They don't want me to take any medications"  She continues to endorse extreme paranoia r/t environmental poisoning as well as feeling like she is being intentionally poisoned

## 2023-03-10 ENCOUNTER — Ambulatory Visit (HOSPITAL_COMMUNITY)
Admission: EM | Admit: 2023-03-10 | Discharge: 2023-03-13 | Disposition: A | Discharge status: Other Acute Inpatient Hospital | Payer: 59 | Attending: Psychiatry | Admitting: Psychiatry

## 2023-03-10 DIAGNOSIS — F22 Delusional disorders: Secondary | ICD-10-CM | POA: Diagnosis not present

## 2023-03-10 DIAGNOSIS — F319 Bipolar disorder, unspecified: Secondary | ICD-10-CM | POA: Diagnosis not present

## 2023-03-10 DIAGNOSIS — E0590 Thyrotoxicosis, unspecified without thyrotoxic crisis or storm: Secondary | ICD-10-CM | POA: Insufficient documentation

## 2023-03-10 DIAGNOSIS — G4700 Insomnia, unspecified: Secondary | ICD-10-CM | POA: Diagnosis not present

## 2023-03-10 DIAGNOSIS — F3010 Manic episode without psychotic symptoms, unspecified: Secondary | ICD-10-CM

## 2023-03-10 LAB — COMPREHENSIVE METABOLIC PANEL
ALT: 21 U/L (ref 0–44)
AST: 15 U/L (ref 15–41)
Albumin: 4 g/dL (ref 3.5–5.0)
Alkaline Phosphatase: 58 U/L (ref 38–126)
Anion gap: 7 (ref 5–15)
BUN: 5 mg/dL — ABNORMAL LOW (ref 6–20)
CO2: 28 mmol/L (ref 22–32)
Calcium: 9.3 mg/dL (ref 8.9–10.3)
Chloride: 105 mmol/L (ref 98–111)
Creatinine, Ser: 0.61 mg/dL (ref 0.44–1.00)
GFR, Estimated: >60 mL/min (ref >60)
Glucose, Bld: 86 mg/dL (ref 70–99)
Potassium: 4.5 mmol/L (ref 3.5–5.1)
Sodium: 140 mmol/L (ref 135–145)
Total Bilirubin: 0.6 mg/dL (ref 0.0–1.2)
Total Protein: 6.9 g/dL (ref 6.5–8.1)

## 2023-03-10 LAB — POCT URINE DRUG SCREEN - MANUAL ENTRY (I-SCREEN)
POC Amphetamine UR: NOT DETECTED
POC Buprenorphine (BUP): NOT DETECTED
POC Cocaine UR: NOT DETECTED
POC Marijuana UR: POSITIVE — AB
POC Methadone UR: NOT DETECTED
POC Methamphetamine UR: NOT DETECTED
POC Morphine: NOT DETECTED
POC Oxazepam (BZO): NOT DETECTED
POC Oxycodone UR: NOT DETECTED
POC Secobarbital (BAR): NOT DETECTED

## 2023-03-10 LAB — CBC WITH DIFFERENTIAL/PLATELET
Abs Immature Granulocytes: 0.02 10*3/uL (ref 0.00–0.07)
Basophils Absolute: 0 10*3/uL (ref 0.0–0.1)
Basophils Relative: 0 %
Eosinophils Absolute: 0.1 10*3/uL (ref 0.0–0.5)
Eosinophils Relative: 1 %
HCT: 39.4 % (ref 36.0–46.0)
Hemoglobin: 12.9 g/dL (ref 12.0–15.0)
Immature Granulocytes: 0 %
Lymphocytes Relative: 41 %
Lymphs Abs: 3.2 10*3/uL (ref 0.7–4.0)
MCH: 27.2 pg (ref 26.0–34.0)
MCHC: 32.7 g/dL (ref 30.0–36.0)
MCV: 83.1 fL (ref 80.0–100.0)
Monocytes Absolute: 0.5 10*3/uL (ref 0.1–1.0)
Monocytes Relative: 6 %
Neutro Abs: 4 10*3/uL (ref 1.7–7.7)
Neutrophils Relative %: 52 %
Platelets: 320 10*3/uL (ref 150–400)
RBC: 4.74 MIL/uL (ref 3.87–5.11)
RDW: 12.8 % (ref 11.5–15.5)
WBC: 7.8 10*3/uL (ref 4.0–10.5)
nRBC: 0 % (ref 0.0–0.2)

## 2023-03-10 LAB — POC URINE PREG, ED: Preg Test, Ur: NEGATIVE

## 2023-03-10 LAB — TSH: TSH: <0.01 u[IU]/mL — ABNORMAL LOW (ref 0.350–4.500)

## 2023-03-10 LAB — ETHANOL: Alcohol, Ethyl (B): <10 mg/dL (ref <10)

## 2023-03-10 MED ORDER — OLANZAPINE 10 MG IM SOLR
10.0000 mg | Freq: Three times a day (TID) | INTRAMUSCULAR | Status: DC | PRN
  Administered 2023-03-10 – 2023-03-12 (×3): 10 mg via INTRAMUSCULAR
  Filled 2023-03-10 (×3): qty 10

## 2023-03-10 MED ORDER — ACETAMINOPHEN 325 MG PO TABS
650.0000 mg | ORAL_TABLET | Freq: Four times a day (QID) | ORAL | Status: DC | PRN
  Filled 2023-03-10: qty 2

## 2023-03-10 MED ORDER — OLANZAPINE 5 MG PO TBDP
5.0000 mg | ORAL_TABLET | Freq: Three times a day (TID) | ORAL | Status: DC | PRN
  Administered 2023-03-11: 5 mg via ORAL
  Filled 2023-03-10 (×3): qty 1

## 2023-03-10 MED ORDER — ALUM & MAG HYDROXIDE-SIMETH 200-200-20 MG/5ML PO SUSP: 30.0000 mL | ORAL | Status: DC | PRN

## 2023-03-10 MED ORDER — MAGNESIUM HYDROXIDE 400 MG/5ML PO SUSP: 30.0000 mL | Freq: Every day | ORAL | Status: DC | PRN

## 2023-03-10 MED ORDER — OLANZAPINE 10 MG IM SOLR
5.0000 mg | Freq: Three times a day (TID) | INTRAMUSCULAR | Status: DC | PRN
  Administered 2023-03-13: 5 mg via INTRAMUSCULAR
  Filled 2023-03-10: qty 10
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
## 2023-03-10 NOTE — ED Provider Notes (Signed)
 [facility] Continuous Assessment Admission H&P  Date: 03/11/23 Patient Name: Veronica Perez MRN: [id number] Chief Complaint: under IVC  Diagnoses:  Final diagnoses:  Manic behavior (HCC)  Paranoid (HCC)  Bipolar I disorder (HCC)    HPI: Veronica Perez, 48 y/o female with a history of bipolar dx, MDD, psychosis presented to [facility] via [facility] under IVC.  According to the IVC respondents was previously committed in December 2024.  Respondents was an old [employer].  Respondents have been prescribed medications.  Respondents is refusing to take her medicine.  Respondents appear to be suffering from some form mental breakdown.  Respondents appeared to be hallucinating.  Respondent states someone is trying to murder her, respondents state people are out to get her respondents is exposing herself in public areas.  Respondents is threatening neighbors, respondents is a danger to self, respondents is a danger to others.    Per the patient my neighbors are trying to set me and my daughter about I do not have time to sleep.  Face to face evaluation of patient,  she is alert and oriented x 4,  speech is clear, maintain eye contact.  Patient overall is appearance is fairly groomed.  Patient does have rapid speech.  Pt denies SI, HI or avh.  Pt report she only drink alcohol on special occasion,  pt denies illicit drug use. Pt fixated on the idea that her neighbors are trying to hurt her and her daughter.  Patient keeps rambling that only her fianc is safe.  Patient also stated that people are trying to take her house from her.  Patient does appear to be paranoid.  Patient does appear to be in a manic state at this point.  However patient is cooperative overall.  Writer discussed with patient at this time she will be admitted for further evaluation.   Recommend inpatient observation.  Total Time spent with patient: 30 minutes  Musculoskeletal  Strength & Muscle Tone: within normal limits Gait &  Station: normal Patient leans: N/A  Psychiatric Specialty Exam  Presentation General Appearance:  Casual  Eye Contact: Good  Speech: Clear and Coherent  Speech Volume: Normal  Handedness: Right   Mood and Affect  Mood: Anxious; Euthymic  Affect: Congruent   Thought Process  Thought Processes: Linear  Descriptions of Associations:Intact  Orientation:Full (Time, Place and Person)  Thought Content:Scattered; Obsessions  Diagnosis of Schizophrenia or Schizoaffective disorder in past: No  Duration of Psychotic Symptoms: N/A  Hallucinations:Hallucinations: None  Ideas of Reference:Paranoia; Delusions  Suicidal Thoughts:Suicidal Thoughts: No  Homicidal Thoughts:Homicidal Thoughts: No   Sensorium  Memory: Immediate Fair  Judgment: Poor  Insight: Fair   Art Therapist  Concentration: Poor  Attention Span: Fair  Recall: Fair  Fund of Knowledge: Good  Language: Good   Psychomotor Activity  Psychomotor Activity: Psychomotor Activity: Normal   Assets  Assets: Resilience   Sleep  Sleep: Sleep: Fair Number of Hours of Sleep: 6   Nutritional Assessment (For OBS and FBC admissions only) Has the patient had a weight loss or gain of 10 pounds or more in the last 3 months?: No Has the patient had a decrease in food intake/or appetite?: No Does the patient have dental problems?: No Does the patient have eating habits or behaviors that may be indicators of an eating disorder including binging or inducing vomiting?: No Has the patient recently lost weight without trying?: 0 Has the patient been eating poorly because of a decreased appetite?: 0 Malnutrition Screening Tool Score:  0    Physical Exam HENT:     Head: Normocephalic.     Nose: Nose normal.  Eyes:     Pupils: Pupils are equal, round, and reactive to light.  Cardiovascular:     Rate and Rhythm: Normal rate.  Pulmonary:     Effort: Pulmonary effort is normal.   Musculoskeletal:        General: Normal range of motion.     Cervical back: Normal range of motion.  Neurological:     General: No focal deficit present.     Mental Status: She is alert.  Psychiatric:        Mood and Affect: Mood normal.        Behavior: Behavior normal.        Thought Content: Thought content normal.        Judgment: Judgment normal.    Review of Systems  Constitutional: Negative.   HENT: Negative.    Eyes: Negative.   Respiratory: Negative.    Cardiovascular: Negative.   Gastrointestinal: Negative.   Genitourinary: Negative.   Musculoskeletal: Negative.   Skin: Negative.   Neurological: Negative.   Psychiatric/Behavioral:  Positive for depression and hallucinations. The patient is nervous/anxious.     Blood pressure (!) 150/83, pulse 90, temperature 98.3 F (36.8 C), temperature source Oral, resp. rate 18, SpO2 99%. There is no height or weight on file to calculate BMI.  Past Psychiatric History: bipolar, MDD   Is the patient at risk to self? No  Has the patient been a risk to self in the past 6 months? Yes .    Has the patient been a risk to self within the distant past? Yes   Is the patient a risk to others? Yes   Has the patient been a risk to others in the past 6 months? Yes   Has the patient been a risk to others within the distant past? Yes   Past Medical History: see chart   Family History: unknown   Social History: alcohol   Last Labs:  Admission on 03/10/2023  Component Date Value Ref Range Status   WBC 03/10/2023 7.8  4.0 - 10.5 K/uL Final   RBC 03/10/2023 4.74  3.87 - 5.11 MIL/uL Final   Hemoglobin 03/10/2023 12.9  12.0 - 15.0 g/dL Final   HCT 97/95/7974 39.4  36.0 - 46.0 % Final   MCV 03/10/2023 83.1  80.0 - 100.0 fL Final   MCH 03/10/2023 27.2  26.0 - 34.0 pg Final   MCHC 03/10/2023 32.7  30.0 - 36.0 g/dL Final   RDW 97/95/7974 12.8  11.5 - 15.5 % Final   Platelets 03/10/2023 320  150 - 400 K/uL Final   nRBC 03/10/2023 0.0   0.0 - 0.2 % Final   Neutrophils Relative % 03/10/2023 52  % Final   Neutro Abs 03/10/2023 4.0  1.7 - 7.7 K/uL Final   Lymphocytes Relative 03/10/2023 41  % Final   Lymphs Abs 03/10/2023 3.2  0.7 - 4.0 K/uL Final   Monocytes Relative 03/10/2023 6  % Final   Monocytes Absolute 03/10/2023 0.5  0.1 - 1.0 K/uL Final   Eosinophils Relative 03/10/2023 1  % Final   Eosinophils Absolute 03/10/2023 0.1  0.0 - 0.5 K/uL Final   Basophils Relative 03/10/2023 0  % Final   Basophils Absolute 03/10/2023 0.0  0.0 - 0.1 K/uL Final   Immature Granulocytes 03/10/2023 0  % Final   Abs Immature Granulocytes 03/10/2023 0.02  0.00 - 0.07  K/uL Final   Performed at [facility], [address] [place], [place] [address]   Sodium 03/10/2023 140  135 - 145 mmol/L Final   Potassium 03/10/2023 4.5  3.5 - 5.1 mmol/L Final   Chloride 03/10/2023 105  98 - 111 mmol/L Final   CO2 03/10/2023 28  22 - 32 mmol/L Final   Glucose, Bld 03/10/2023 86  70 - 99 mg/dL Final   Glucose reference range applies only to samples taken after fasting for at least 8 hours.   BUN 03/10/2023 5 (L)  6 - 20 mg/dL Final   Creatinine, Ser 03/10/2023 0.61  0.44 - 1.00 mg/dL Final   Calcium 97/95/7974 9.3  8.9 - 10.3 mg/dL Final   Total Protein 97/95/7974 6.9  6.5 - 8.1 g/dL Final   Albumin 97/95/7974 4.0  3.5 - 5.0 g/dL Final   AST 97/95/7974 15  15 - 41 U/L Final   ALT 03/10/2023 21  0 - 44 U/L Final   Alkaline Phosphatase 03/10/2023 58  38 - 126 U/L Final   Total Bilirubin 03/10/2023 0.6  0.0 - 1.2 mg/dL Final   GFR, Estimated 03/10/2023 >60  >60 mL/min Final   Comment: (NOTE) Calculated using the CKD-EPI Creatinine Equation (2021)    Anion gap 03/10/2023 7  5 - 15 Final   Performed at [facility], [address], [place], [place] [address]   Alcohol, Ethyl (B) 03/10/2023 <10  <10 mg/dL Final   Comment: (NOTE) Lowest detectable limit for serum alcohol is 10 mg/dL.  For medical purposes only. Performed at [facility], [address] [place], [place] [address]    TSH 03/10/2023 <0.010 (L)  0.350 - 4.500 uIU/mL Final   Comment: Performed by a 3rd Generation assay with a functional sensitivity of <=0.01 uIU/mL. Performed at [facility], [address] [place], [place] [address]    Preg Test, Ur 03/10/2023 Negative  Negative Final   POC Amphetamine UR 03/10/2023 None Detected  NONE DETECTED (Cut Off Level 1000 ng/mL) Final   POC Secobarbital (BAR) 03/10/2023 None Detected  NONE DETECTED (Cut Off Level 300 ng/mL) Final   POC Buprenorphine (BUP) 03/10/2023 None Detected  NONE DETECTED (Cut Off Level 10 ng/mL) Final   POC Oxazepam (BZO) 03/10/2023 None Detected  NONE DETECTED (Cut Off Level 300 ng/mL) Final   POC Cocaine UR 03/10/2023 None Detected  NONE DETECTED (Cut Off Level 300 ng/mL) Final   POC Methamphetamine UR 03/10/2023 None Detected  NONE DETECTED (Cut Off Level 1000 ng/mL) Final   POC Morphine 03/10/2023 None Detected  NONE DETECTED (Cut Off Level 300 ng/mL) Final   POC Methadone UR 03/10/2023 None Detected  NONE DETECTED (Cut Off Level 300 ng/mL) Final   POC Oxycodone UR 03/10/2023 None Detected  NONE DETECTED (Cut Off Level 100 ng/mL) Final   POC Marijuana UR 03/10/2023 Positive (A)  NONE DETECTED (Cut Off Level 50 ng/mL) Final  Admission on 01/27/2023, Discharged on 01/30/2023  Component Date Value Ref Range Status   WBC 01/27/2023 8.1  4.0 - 10.5 K/uL Final   RBC 01/27/2023 4.43  3.87 - 5.11 MIL/uL Final   Hemoglobin 01/27/2023 11.9 (L)  12.0 - 15.0 g/dL Final   HCT 87/75/7975 35.4 (L)  36.0 - 46.0 % Final   MCV 01/27/2023 79.9 (L)  80.0 - 100.0 fL Final   MCH 01/27/2023 26.9  26.0 - 34.0 pg Final   MCHC 01/27/2023 33.6  30.0 - 36.0 g/dL  Final   RDW 01/27/2023 13.0  11.5 - 15.5 % Final   Platelets 01/27/2023 270  150 - 400 K/uL Final   nRBC 01/27/2023 0.0  0.0 - 0.2 % Final   Performed at [facility], [address] [place], [place] [address]    Glucose-Capillary 01/29/2023 98  70 - 99 mg/dL Final   Glucose reference range applies only to samples taken after fasting for at least 8 hours.   Preg Test, Ur 01/27/2023 Negative  Negative Final   Alcohol, Ethyl (B) 01/27/2023 <10  <10 mg/dL Final   Comment: (NOTE) Lowest detectable limit for serum alcohol is 10 mg/dL.  For medical purposes only. Performed at [facility], [address] [place], [place] [address]    Color, Urine 01/27/2023 YELLOW (A)  YELLOW Final   APPearance 01/27/2023 HAZY (A)  CLEAR Final   Specific Gravity, Urine 01/27/2023 1.019  1.005 - 1.030 Final   pH 01/27/2023 5.0  5.0 - 8.0 Final   Glucose, UA 01/27/2023 NEGATIVE  NEGATIVE mg/dL Final   Hgb urine dipstick 01/27/2023 MODERATE (A)  NEGATIVE Final   Bilirubin Urine 01/27/2023 NEGATIVE  NEGATIVE Final   Ketones, ur 01/27/2023 80 (A)  NEGATIVE mg/dL Final   Protein, ur 87/75/7975 30 (A)  NEGATIVE mg/dL Final   Nitrite 87/75/7975 NEGATIVE  NEGATIVE Final   Leukocytes,Ua 01/27/2023 SMALL (A)  NEGATIVE Final   RBC / HPF 01/27/2023 0-5  0 - 5 RBC/hpf Final   WBC, UA 01/27/2023 6-10  0 - 5 WBC/hpf Final   Bacteria, UA 01/27/2023 MANY (A)  NONE SEEN Final   Squamous Epithelial / HPF 01/27/2023 0-5  0 - 5 /HPF Final   Mucus 01/27/2023 PRESENT   Final   Hyaline Casts, UA 01/27/2023 PRESENT   Final   Performed at [facility], [address] [place], [place] [address]   Sodium 01/27/2023 135  135 - 145 mmol/L Final   Potassium 01/27/2023 3.1 (L)  3.5 - 5.1 mmol/L Final   Chloride 01/27/2023 100  98 - 111 mmol/L Final   CO2 01/27/2023 17 (L)  22 - 32 mmol/L Final   Glucose, Bld 01/27/2023 73  70 - 99 mg/dL Final   Glucose reference range applies only to samples taken after fasting for at least 8 hours.   BUN 01/27/2023 13  6 - 20 mg/dL Final   Creatinine, Ser 01/27/2023 0.64  0.44 - 1.00 mg/dL Final   Calcium 87/75/7975 8.5 (L)  8.9 - 10.3 mg/dL Final   Total Protein 87/75/7975 6.6  6.5 -  8.1 g/dL Final   Albumin 87/75/7975 3.4 (L)  3.5 - 5.0 g/dL Final   AST 87/75/7975 34  15 - 41 U/L Final   ALT 01/27/2023 50 (H)  0 - 44 U/L Final   Alkaline Phosphatase 01/27/2023 57  38 - 126 U/L Final   Total Bilirubin 01/27/2023 1.2 (H)  <1.2 mg/dL Final   GFR, Estimated 01/27/2023 >60  >60 mL/min Final   Comment: (NOTE) Calculated using the CKD-EPI Creatinine Equation (2021)    Anion gap 01/27/2023 18 (H)  5 - 15 Final   Performed at [facility], [address], [place], [place] [address]   hCG, Dorthea Cleverly, Quant, S 01/27/2023 <1  <5 mIU/mL Final   Comment:          GEST. AGE      CONC.  (mIU/mL)   <=1 WEEK        5 - 50  2 WEEKS       50 - 500     3 WEEKS       100 - 10,000     4 WEEKS     1,000 - 30,000     5 WEEKS     3,500 - 115,000   6-8 WEEKS     12,000 - 270,000    12 WEEKS     15,000 - 220,000        FEMALE AND NON-PREGNANT FEMALE:     LESS THAN 5 mIU/mL Performed at [facility], [address] [place], [place] [address]    Lipase 01/27/2023 26  11 - 51 U/L Final   Performed at [facility], [address] [place], [place] [address]   Tricyclic, Ur Screen 01/27/2023 NONE DETECTED  NONE DETECTED Final   Amphetamines, Ur Screen 01/27/2023 NONE DETECTED  NONE DETECTED Final   MDMA (Ecstasy)Ur Screen 01/27/2023 NONE DETECTED  NONE DETECTED Final   Cocaine Metabolite,Ur [place] 01/27/2023 NONE DETECTED  NONE DETECTED Final   Opiate, Ur Screen 01/27/2023 NONE DETECTED  NONE DETECTED Final   Phencyclidine (PCP) Ur S 01/27/2023 NONE DETECTED  NONE DETECTED Final   Cannabinoid 50 Ng, Ur [place] 01/27/2023 POSITIVE (A)  NONE DETECTED Final   Barbiturates, Ur Screen 01/27/2023 NONE DETECTED  NONE DETECTED Final   Benzodiazepine, Ur Scrn 01/27/2023 NONE DETECTED  NONE DETECTED Final   Methadone Scn, Ur 01/27/2023 NONE DETECTED  NONE DETECTED Final   Comment: (NOTE) Tricyclics + metabolites, urine    Cutoff 1000 ng/mL Amphetamines + metabolites, urine   Cutoff 1000 ng/mL MDMA (Ecstasy), urine              Cutoff 500 ng/mL Cocaine Metabolite, urine          Cutoff 300 ng/mL Opiate + metabolites, urine        Cutoff 300 ng/mL Phencyclidine (PCP), urine         Cutoff 25 ng/mL Cannabinoid, urine                 Cutoff 50 ng/mL Barbiturates + metabolites, urine  Cutoff 200 ng/mL Benzodiazepine, urine              Cutoff 200 ng/mL Methadone, urine                   Cutoff 300 ng/mL  The urine drug screen provides only a preliminary, unconfirmed analytical test result and should not be used for non-medical purposes. Clinical consideration and professional judgment should be applied to any positive drug screen result due to possible interfering substances. A more specific alternate chemical method must be used in order to obtain a confirmed analytical result. Gas chromatography / mass spectrometry (GC/MS) is the preferred confirm                          atory method. Performed at [facility], [address] [place], [place] [address]    Preg Test, Ur 01/27/2023 NEGATIVE  NEGATIVE Final   Comment:        THE SENSITIVITY OF THIS METHODOLOGY IS >24 mIU/mL   Admission on 11/26/2022, Discharged on 11/27/2022  Component Date Value Ref Range Status   Preg Test, Ur 11/26/2022 NEGATIVE  NEGATIVE Final   Comment:        THE SENSITIVITY OF THIS METHODOLOGY IS >24 mIU/mL    Lipase 11/26/2022 24  11 - 51 U/L Final  Performed at [facility], [address], [place], [place] [address]   Sodium 11/26/2022 139  135 - 145 mmol/L Final   Potassium 11/26/2022 3.6  3.5 - 5.1 mmol/L Final   Chloride 11/26/2022 106  98 - 111 mmol/L Final   CO2 11/26/2022 22  22 - 32 mmol/L Final   Glucose, Bld 11/26/2022 89  70 - 99 mg/dL Final   Glucose reference range applies only to samples taken after fasting for at least 8 hours.   BUN 11/26/2022 7  6 - 20 mg/dL Final   Creatinine, Ser 11/26/2022 0.57  0.44 - 1.00 mg/dL Final   Calcium  89/76/7975 8.8 (L)  8.9 - 10.3 mg/dL Final   Total Protein 89/76/7975 6.9  6.5 - 8.1 g/dL Final   Albumin 89/76/7975 3.6  3.5 - 5.0 g/dL Final   AST 89/76/7975 22  15 - 41 U/L Final   ALT 11/26/2022 23  0 - 44 U/L Final   Alkaline Phosphatase 11/26/2022 59  38 - 126 U/L Final   Total Bilirubin 11/26/2022 0.4  0.3 - 1.2 mg/dL Final   GFR, Estimated 11/26/2022 >60  >60 mL/min Final   Comment: (NOTE) Calculated using the CKD-EPI Creatinine Equation (2021)    Anion gap 11/26/2022 11  5 - 15 Final   Performed at [facility], [address] [place], [place] [address]   WBC 11/26/2022 11.3 (H)  4.0 - 10.5 K/uL Final   RBC 11/26/2022 5.05  3.87 - 5.11 MIL/uL Final   Hemoglobin 11/26/2022 13.2  12.0 - 15.0 g/dL Final   HCT 89/76/7975 39.7  36.0 - 46.0 % Final   MCV 11/26/2022 78.6 (L)  80.0 - 100.0 fL Final   MCH 11/26/2022 26.1  26.0 - 34.0 pg Final   MCHC 11/26/2022 33.2  30.0 - 36.0 g/dL Final   RDW 89/76/7975 13.4  11.5 - 15.5 % Final   Platelets 11/26/2022 322  150 - 400 K/uL Final   nRBC 11/26/2022 0.0  0.0 - 0.2 % Final   Performed at [facility], [address] [place], [place] [address]    Allergies: Penicillins, Iodine, Lactose intolerance (gi), and Penicillins  Medications:  Facility Ordered Medications  Medication   acetaminophen  (TYLENOL ) tablet 650 mg   alum & mag hydroxide-simeth (MAALOX/MYLANTA) 200-200-20 MG/5ML suspension 30 mL   magnesium  hydroxide (MILK OF MAGNESIA) suspension 30 mL   OLANZapine  zydis (ZYPREXA ) disintegrating tablet 5 mg   OLANZapine  (ZYPREXA ) injection 5 mg   OLANZapine  (ZYPREXA ) injection 10 mg   PTA Medications  Medication Sig   Multiple Vitamins-Minerals (MULTIVITAMIN ADULTS) TABS Take 1 tablet by mouth every morning. (Patient not taking: Reported on 01/27/2023)   fluticasone  (FLONASE ) 50 MCG/ACT nasal spray Place 1 spray into both nostrils daily. (Patient not taking: Reported on 01/27/2023)   sodium chloride  (OCEAN) 0.65 %  SOLN nasal spray Place 1 spray into both nostrils as needed for congestion. (Patient not taking: Reported on 01/27/2023)   hydrocortisone  cream 1 % Apply topically 3 (three) times daily. (Patient not taking: Reported on 01/27/2023)      Medical Decision Making  Inpatient observation,  will need to go inpatient admission when a bed become available     Recommendations  Based on my evaluation the patient does not appear to have an emergency medical condition.  Gaither Pouch, NP 03/11/23  5:32 AM

## 2023-03-10 NOTE — ED Notes (Signed)
 Patient admitted to OBS under IVC commitment. Patient is disorganized in speech. Patient denies SI/HI and AVH. However, patient appears to be responding to internal and external stimuli. Patient oriented to unit meals offered patient declined. Encouragement and support provided and safety maintain.

## 2023-03-10 NOTE — ED Notes (Signed)
 Patient has been talking incoherently on the unit apparently responding to internal stimuli. Speech is loud and she has been randomly shouting out on the unit disturbing and agitating other guests without any response to the effect of her behaviors. She has not responded to non-pharmacological means of de-escalation including distraction techniques including watching tv, deep breathing exercises, and supportive listening. Patient continued to get more agitated and loud on the unit so Zyprexa  10 mg IM administered PRN for agitation. Patient received without incident. Continuing to monitor/support.

## 2023-03-11 DIAGNOSIS — F22 Delusional disorders: Secondary | ICD-10-CM | POA: Diagnosis not present

## 2023-03-11 DIAGNOSIS — F319 Bipolar disorder, unspecified: Secondary | ICD-10-CM | POA: Diagnosis not present

## 2023-03-11 DIAGNOSIS — G4700 Insomnia, unspecified: Secondary | ICD-10-CM | POA: Diagnosis not present

## 2023-03-11 DIAGNOSIS — E0590 Thyrotoxicosis, unspecified without thyrotoxic crisis or storm: Secondary | ICD-10-CM | POA: Diagnosis not present

## 2023-03-11 NOTE — ED Notes (Signed)
 Patient resting quietly in bed with eyes closed. Respirations equal and unlabored, skin warm and dry, NAD. Routine safety checks conducted according to facility protocol. Will continue to monitor for safety.

## 2023-03-11 NOTE — ED Notes (Signed)
 Patient observed/assessed in bed/chair with patient sitting up. It is difficult to assess patient as responses are tangential, grandiose, and difficult to follow. Patient continually speaks to herself seemingly responding to self or internal stimuli. Patient denies S/I and H/I at this time. Continuing to monitor/support.

## 2023-03-11 NOTE — ED Notes (Signed)
 Patient observed/assessed in bed/chair resting quietly appearing in no distress and verbalizing no complaints at this time. Will continue to monitor.
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
## 2023-03-11 NOTE — Discharge Instructions (Addendum)
 Inpatient treatment at [facility].

## 2023-03-11 NOTE — Progress Notes (Signed)
 Pt has been accepted to [facility] TODAY 03/11/2023  Bed assignment: Main campus  Pt meets inpatient criteria per Majel Cowman NP  Attending Physician will be Millie Manners, MD  Report can be called to: [phone] (this is a pager, please leave call-back number when giving report)  Pt can arrive ASAP   Care Team Notified: Majel Cowman NP, Ardelle Blush RN  Tunisia Javonni Macke LCSW-A   03/11/2023 2:49 PM

## 2023-03-11 NOTE — Progress Notes (Signed)
 LCSW Progress Note  [id number]   Veronica Perez  03/11/2023  11:48 AM  Description:   Inpatient Psychiatric Referral  Patient was recommended inpatient per Majel Cowman NP. There are no available beds at [facility], per [facility] [name] RN. Patient was referred to the following out of network facilities:    Destination  Service Provider Address Phone Fax  [facility] [address], [place] [address] [phone]  [name] [address] [place] [address] [phone]  [facility] [address] [place] [address] [phone]  [facility] [address] [place], [place] [address] [phone]  [facility] [address] [place] [address] [phone]  [facility] [address], [place] [address] [phone]  [facility] [address] [place] [address] [phone]  [facility] [address] [place] [address] [phone]  [facility] [place] [phone]  [facility] EFAX [address] [place], [place] [phone]  [facility] [address] [place] [address] [phone]  [facility] [address] [place] [address] [phone]  [facility] [address], [place] [address] [phone]     Situation ongoing, CSW to continue following and update chart as more information becomes available.      Tunisia Hila Bolding, MSW, LCSW  03/11/2023 11:48 AM

## 2023-03-12 DIAGNOSIS — F319 Bipolar disorder, unspecified: Secondary | ICD-10-CM | POA: Diagnosis not present

## 2023-03-12 DIAGNOSIS — E0590 Thyrotoxicosis, unspecified without thyrotoxic crisis or storm: Secondary | ICD-10-CM | POA: Diagnosis not present

## 2023-03-12 DIAGNOSIS — G4700 Insomnia, unspecified: Secondary | ICD-10-CM | POA: Diagnosis not present

## 2023-03-12 LAB — T4, FREE: Free T4: 2.54 ng/dL — ABNORMAL HIGH (ref 0.61–1.12)

## 2023-03-12 NOTE — ED Notes (Signed)
 Patient sitting up talking to her neighbor. Respirations equal and unlabored, skin warm and dry, NAD. Routine safety checks conducted according to facility protocol. Will continue to monitor for safety.

## 2023-03-12 NOTE — ED Notes (Signed)
 Pt is currently sleeping, no distress noted, environmental check complete, will continue to monitor patient for safety.
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
## 2023-03-12 NOTE — ED Notes (Signed)
 Pt sitting in bed at the current talking to self. Resident denies HI, SI, & AVH at this time. Denies pain or any discomfort at this time. No s/s of acute distress observed. VSS. Safety maintained. Will continue to monitor and report any COC.

## 2023-03-12 NOTE — BH Assessment (Addendum)
 Disposition Note:   The patient was initially accepted at [facility], yesterday,  but the bed was canceled due to an elevated TSH level. Per Alan Mcardle, NP, 'The patient has a history of hyperthyroidism, and her TSH and T4 levels are consistent with her baseline. She has refused thyroid  medication from her PCP for years and is currently asymptomatic, with no history of Thyroid  Storm. After consulting with the attending doctor, we agreed that the current levels represent the patient's baseline and are not considered a medical emergency.'  The patient continues to meet the criteria for inpatient psychiatric treatment, as confirmed by Alan Mcardle, NP.  Disposition Counselor has referred the patient back to [facility] and to alternative facilities (listed below). Follow-up is ongoing.  Destination  Service Provider Request Status Services Address Phone Fax Patient Preferred  [facility] Pending - Request Sent -- [address], [place] [address] [phone] --  [facility] Pending - Request Sent -- [address], [place] [address] [phone] --  [facility] Pending - Request Sent -- [address] [place] [address] [phone] --  [facility] Pending - Request Sent -- [address] [place], [place] [address] [phone] --  [facility] Pending - Request Sent -- [address] [place] [address] [phone] --  [facility] Pending - Request Sent -- [address] [place] [address] [phone] --  [facility] Pending - Request Sent -- [address], [place] [address] [phone] --  [facility] Pending - Request Sent -- [address] [place] [address] [phone] --  [facility] Pending - Request Sent -- [place] [phone] --  [facility] Pending - Request Sent -- [address] [place] [phone] --  [facility] Pending - Request Sent -- [address] [place] [address] [phone] --  [facility] Pending - Request Sent -- [address] [place] [address] [phone] --  [facility] Pending - Request Sent -- [address] [place] [address] [phone]

## 2023-03-12 NOTE — ED Notes (Signed)
 Patient observed/assessed in bed/chair resting quietly appearing in no distress and verbalizing no complaints at this time. Will continue to monitor.
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
## 2023-03-12 NOTE — Progress Notes (Signed)
 LCSW Progress Note  [id number]   Veronica Perez  03/12/2023  8:55 PM    Inpatient Behavioral Health Placement  Pt meets inpatient criteria per Alan JAYSON Mcardle, NP . There are no available beds within [facility] system per [facility] Linsey Strader,RN. Referral was sent to the following facilities;   Destination  Service Provider Address Phone Fax  [facility] [address], [place] [address] [phone]  [facility] [address] [place] [address] [phone]  [facility] [address] [place] [address] [phone]  [facility] [address] [place], [place] [address] [phone]  [facility] [address] [place] [address] [phone]  [facility] [address] [place] [address] [phone]  [facility] [address] [place] [address] [phone]  [facility] [address], [place] [address] [phone]  [facility] [place] [phone]  [facility] EFAX [address] [place], [place] [phone]  [facility] [address] [place] [address] [phone]  [facility] [address] [place], [place] [address] [phone]  [facility] [address], [place] [address] [phone]    Situation ongoing,  CSW will follow up.    Mitzie GEANNIE Pinal, MSW, [facility] 03/12/2023 8:55 PM

## 2023-03-12 NOTE — ED Notes (Signed)
 Patient remains paranoid with persecutory ideas.  She has been talking to self and has been loud at times.  Patient TSH was low and her bed at [facility] was rescinded.  She was faxed out and no disposition yet.

## 2023-03-12 NOTE — Progress Notes (Signed)
 Pt was accepted to [facility] TOMORROW 03/13/2023 ; Bed Assignment [facility] Fax Number: [phone] (Adult)   Pt meets inpatient criteria per Alan JAYSON Mcardle, NP      Attending Physician will be Dr. Millie Manners     Report can be called to:[phone]-Pager number, please leave a returned phone number to receive a phone call back.    Pt can arrive after 8:00am    Care Team notified: Rosaline Haley, LPN   Mitzie GEANNIE Pinal, MSW, [facility] 03/12/2023 9:57 PM

## 2023-03-12 NOTE — ED Notes (Addendum)
 Pt worked up & going on rampage about being IVC'd and not being d/c to home. Pt having loud outburst/talking to self Pt getting other pts on unit worked. PRN medication to be administered to assist in redirection of pt. Will continue to monitor for safety and report any COC.

## 2023-03-13 ENCOUNTER — Inpatient Hospital Stay (HOSPITAL_COMMUNITY)
Admit: 2023-03-13 | Discharge: 2023-03-27 | DRG: 885 | Disposition: A | Discharge status: Home / Self Care | Payer: 59 | Source: Ambulatory Visit | Attending: Psychiatry | Admitting: Psychiatry

## 2023-03-13 DIAGNOSIS — E739 Lactose intolerance, unspecified: Secondary | ICD-10-CM | POA: Diagnosis present

## 2023-03-13 DIAGNOSIS — Z880 Allergy status to penicillin: Secondary | ICD-10-CM | POA: Diagnosis not present

## 2023-03-13 DIAGNOSIS — F22 Delusional disorders: Secondary | ICD-10-CM | POA: Diagnosis not present

## 2023-03-13 DIAGNOSIS — F312 Bipolar disorder, current episode manic severe with psychotic features: Secondary | ICD-10-CM | POA: Diagnosis present

## 2023-03-13 DIAGNOSIS — F3112 Bipolar disorder, current episode manic without psychotic features, moderate: Secondary | ICD-10-CM | POA: Diagnosis present

## 2023-03-13 DIAGNOSIS — E0590 Thyrotoxicosis, unspecified without thyrotoxic crisis or storm: Secondary | ICD-10-CM | POA: Diagnosis present

## 2023-03-13 DIAGNOSIS — Z888 Allergy status to other drugs, medicaments and biological substances status: Secondary | ICD-10-CM

## 2023-03-13 DIAGNOSIS — Z833 Family history of diabetes mellitus: Secondary | ICD-10-CM | POA: Diagnosis not present

## 2023-03-13 DIAGNOSIS — F319 Bipolar disorder, unspecified: Secondary | ICD-10-CM

## 2023-03-13 DIAGNOSIS — Z91048 Other nonmedicinal substance allergy status: Secondary | ICD-10-CM | POA: Diagnosis not present

## 2023-03-13 DIAGNOSIS — F17210 Nicotine dependence, cigarettes, uncomplicated: Secondary | ICD-10-CM | POA: Diagnosis present

## 2023-03-13 DIAGNOSIS — Z818 Family history of other mental and behavioral disorders: Secondary | ICD-10-CM | POA: Diagnosis not present

## 2023-03-13 DIAGNOSIS — G479 Sleep disorder, unspecified: Secondary | ICD-10-CM | POA: Diagnosis present

## 2023-03-13 DIAGNOSIS — E039 Hypothyroidism, unspecified: Secondary | ICD-10-CM | POA: Diagnosis present

## 2023-03-13 DIAGNOSIS — Z5900 Homelessness unspecified: Secondary | ICD-10-CM | POA: Diagnosis not present

## 2023-03-13 DIAGNOSIS — G4700 Insomnia, unspecified: Secondary | ICD-10-CM | POA: Diagnosis not present

## 2023-03-13 DIAGNOSIS — F250 Schizoaffective disorder, bipolar type: Secondary | ICD-10-CM | POA: Diagnosis present

## 2023-03-13 DIAGNOSIS — Z8249 Family history of ischemic heart disease and other diseases of the circulatory system: Secondary | ICD-10-CM | POA: Diagnosis not present

## 2023-03-13 LAB — T3, FREE: T3, Free: 8.1 pg/mL — ABNORMAL HIGH (ref 2.0–4.4)

## 2023-03-13 MED ORDER — HALOPERIDOL LACTATE 5 MG/ML IJ SOLN: 5.0000 mg | Freq: Three times a day (TID) | INTRAMUSCULAR | Status: DC | PRN

## 2023-03-13 MED ORDER — LORAZEPAM 2 MG/ML IJ SOLN: 2.0000 mg | Freq: Three times a day (TID) | INTRAMUSCULAR | Status: DC | PRN

## 2023-03-13 MED ORDER — DIPHENHYDRAMINE HCL 25 MG PO CAPS
50.0000 mg | ORAL_CAPSULE | Freq: Three times a day (TID) | ORAL | Status: DC | PRN
  Filled 2023-03-13: qty 2

## 2023-03-13 MED ORDER — ACETAMINOPHEN 325 MG PO TABS
650.0000 mg | ORAL_TABLET | Freq: Four times a day (QID) | ORAL | Status: DC | PRN
  Administered 2023-03-14 – 2023-03-20 (×3): 650 mg via ORAL
  Filled 2023-03-13 (×3): qty 2

## 2023-03-13 MED ORDER — DIPHENHYDRAMINE HCL 50 MG/ML IJ SOLN: 50.0000 mg | Freq: Three times a day (TID) | INTRAMUSCULAR | Status: DC | PRN

## 2023-03-13 MED ORDER — MAGNESIUM HYDROXIDE 400 MG/5ML PO SUSP: 30.0000 mL | Freq: Every day | ORAL | Status: DC | PRN

## 2023-03-13 MED ORDER — METHIMAZOLE 5 MG PO TABS
10.0000 mg | ORAL_TABLET | Freq: Two times a day (BID) | ORAL | Status: DC
  Administered 2023-03-13 (×2): 10 mg via ORAL
  Filled 2023-03-13 (×4): qty 1
  Filled 2023-03-13: qty 2

## 2023-03-13 MED ORDER — ALUM & MAG HYDROXIDE-SIMETH 200-200-20 MG/5ML PO SUSP: 30.0000 mL | ORAL | Status: DC | PRN

## 2023-03-13 MED ORDER — HALOPERIDOL 5 MG PO TABS
5.0000 mg | ORAL_TABLET | Freq: Three times a day (TID) | ORAL | Status: DC | PRN
  Filled 2023-03-13: qty 1

## 2023-03-13 MED ORDER — METHIMAZOLE 10 MG PO TABS
10.0000 mg | ORAL_TABLET | Freq: Two times a day (BID) | ORAL | Status: DC
  Administered 2023-03-14 (×2): 10 mg via ORAL
  Filled 2023-03-13 (×11): qty 1

## 2023-03-13 MED ORDER — HYDROXYZINE HCL 25 MG PO TABS
25.0000 mg | ORAL_TABLET | Freq: Three times a day (TID) | ORAL | Status: DC | PRN
  Administered 2023-03-14 – 2023-03-17 (×9): 25 mg via ORAL
  Filled 2023-03-13 (×9): qty 1

## 2023-03-13 MED ORDER — HALOPERIDOL LACTATE 5 MG/ML IJ SOLN: 10.0000 mg | Freq: Three times a day (TID) | INTRAMUSCULAR | Status: DC | PRN

## 2023-03-13 NOTE — ED Notes (Signed)
 Pt was provided dinner.

## 2023-03-13 NOTE — Progress Notes (Signed)
 LCSW Progress Note  [id number]   Veronica Perez  03/13/2023  10:19 AM  Description:   Inpatient Psychiatric Referral  Patient was recommended inpatient per Alan Mcardle NP). There are [facility], per [facility] RN). Patient was referred to the following out of network facilities:   Destination  Service Provider Address Phone Fax  [facility] [address] [place] [address] [phone]  [facility] [address] [place] [address] [phone]  [facility] [address] [place] [address] [phone]  [facility] [address] [place], [place] [address] [phone]  [facility] [address] [place] [address] [phone]  [facility] [address], [place] [address] [phone]  [facility] [address] [place] [address] [phone]  [facility] [address], [place] [address] [phone]  [facility] BED Management [facility]al [facility] [place] [phone]  [facility] EFAX [address] [place], [place] [phone]  [facility] [address], [place] [address] [phone]  [facility] [address] [place] [address] [phone]  [facility] [address] [place] [address] [phone]  [facility] [address] [place], [place] [address] [phone]  [facility] [address] [place] [address] [phone]  [facility]  Patient Placement [facility], [place] [phone]  [facility] [address] [place], [place] [address] [phone]  [facility] [address], [place] [address] [phone]  [facility] [address] [place] [address] [phone]  [facility] [address] [place] [address] [phone]  [facility] [address] [place] [address] [phone]  [facility] [address], [place] [address] [phone]  [facility] [address] [place] [address] [phone]  [facility] [address], [place] [address] [phone]  [facility] [address] [place], [place] [address] [phone]  [facility] Inpatient EFAX [place] [phone]  [facility] [address] [place] [address] [phone]  [facility] [address], [place] [address] [phone]  [facility] [address] [place] [address] [phone]  [facility] [address] [place] [address] [phone]      Situation ongoing, CSW to continue following and update chart as more information becomes available.      Tunisia Dylanie Quesenberry, MSW, LCSW  03/13/2023 10:19 AM

## 2023-03-13 NOTE — ED Notes (Signed)
 Received a call from Joe at [facility] to obtain information for admission consideration.

## 2023-03-13 NOTE — ED Notes (Signed)
 Patient is alert and oriented to situation, time, and self and sitting in the day area watching TV with any distress noted. She is calm and cooperative. She is wearing paper scrubs and currently denies SIHI, AVH, and depression. Patient reports having anxiety 5, due to her neighbor. She reports eating and sleeping better since being here and reports being homeless. Staff will continue to monitor safety and changes in her condition.

## 2023-03-13 NOTE — ED Notes (Signed)
 Patient calm, being socially interactive with peer in a normal tone of voice.

## 2023-03-13 NOTE — ED Notes (Signed)
 Patient resting quietly in bed with eyes closed. Respirations equal and unlabored, skin warm and dry, NAD. Routine safety checks conducted according to facility protocol. Will continue to monitor for safety.

## 2023-03-13 NOTE — ED Notes (Signed)
 At 1010 Patient observed loudly talking to self, swearing, and pacing the milieu. The patient was not able to be redirected verbally as she was tangential and having loose association with her self-directed conversation. Patient observed responding to unknown/seen forces. Patient stating  you are not going to hurt my daughter, this loud music and radio-waves, I worked for the [employer] and the [employer]... Patient made a phone call talking to someone about the same and about moving or being put out because of the loud music and radio-waves, them not caring what happens to her and her daughter... Zyprexa  10 mg reconstituted with 2.1 ml sterile water , 5 mg = 1 ml administered into RD without problems. Patient tolerated injection well. Patient currently taking a shower.

## 2023-03-13 NOTE — Progress Notes (Signed)
 Pt has been accepted to [facility] on 03/13/2023 Bed assignment: 506-1  Pt meets inpatient criteria per: Alan Mcardle NP  Attending Physician will be: Dr. Levada Pillar, MD   Report can be called to: Adult unit: [phone]  Pt can arrive after 8 PM TONIGHT   Care Team Notified: [facility] RN, Alan Mcardle NP, April Coleman RN, Rafe Gerold RN, Rashell Mclean RN    [name] LCSW-A   03/13/2023 2:48 PM

## 2023-03-13 NOTE — ED Notes (Signed)
 Sheriff called for morning transport to [facility]
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
## 2023-03-13 NOTE — ED Notes (Signed)
 Pt was provided lunch

## 2023-03-14 ENCOUNTER — Encounter (HOSPITAL_COMMUNITY): Payer: Self-pay | Admitting: Psychiatry

## 2023-03-14 ENCOUNTER — Other Ambulatory Visit: Payer: Self-pay

## 2023-03-14 DIAGNOSIS — F3112 Bipolar disorder, current episode manic without psychotic features, moderate: Secondary | ICD-10-CM | POA: Diagnosis not present

## 2023-03-14 DIAGNOSIS — F312 Bipolar disorder, current episode manic severe with psychotic features: Secondary | ICD-10-CM | POA: Diagnosis not present

## 2023-03-14 MED ORDER — WHITE PETROLATUM EX OINT
TOPICAL_OINTMENT | CUTANEOUS | Status: AC
  Filled 2023-03-14: qty 5

## 2023-03-14 MED ORDER — TRAZODONE HCL 50 MG PO TABS
50.0000 mg | ORAL_TABLET | Freq: Every day | ORAL | Status: DC
  Administered 2023-03-14 – 2023-03-26 (×13): 50 mg via ORAL
  Filled 2023-03-14 (×18): qty 1

## 2023-03-14 MED ORDER — TRAZODONE HCL 50 MG PO TABS
50.0000 mg | ORAL_TABLET | Freq: Once | ORAL | Status: DC
  Filled 2023-03-14 (×2): qty 1

## 2023-03-14 MED ORDER — OLANZAPINE 10 MG PO TABS
10.0000 mg | ORAL_TABLET | Freq: Every day | ORAL | Status: DC
  Administered 2023-03-14 – 2023-03-16 (×4): 10 mg via ORAL
  Filled 2023-03-14 (×7): qty 1

## 2023-03-14 NOTE — Plan of Care (Signed)
   Problem: Education: Goal: Emotional status will improve Outcome: Not Progressing Goal: Mental status will improve Outcome: Not Progressing

## 2023-03-14 NOTE — H&P (Signed)
 Psychiatric Admission Assessment Adult  Patient Identification: Veronica Perez MRN:  [id number] Date of Evaluation:  03/14/2023 Chief Complaint:  Bipolar 1 disorder, manic, moderate (HCC) [F31.12] Principal Diagnosis: Bipolar I disorder, current or most recent episode manic, with psychotic features (HCC) Diagnosis:  Principal Problem:   Bipolar I disorder, current or most recent episode manic, with psychotic features (HCC) Active Problems:   Hyperthyroidism  History of Present Illness:  Veronica Perez is a 48 yr old female who presented on 2/4 to [facility] by [facility] under IVC due to paranoia and threatening her neighbors, she was admitted to [facility] on 2/8.  PPHx is significant for Bipolar Disorder and ~4 Prior Psychiatric Hospitalizations (last- [facility] 01/2023), and no Suicide Attempts or Self Injurious Behavior.  She reports that she is being terrorized where she is living.  She reports that the elderly ladies who are her neighbors are always watching her.  She reports calling the police multiple times with evidence and that they never do anything.  She then reports that her father took her car keys and brought her here to get back at her.  She reports she does not need to be here just because she does not take her medicines.  She reports that she has filed multiple [employer] because her daughter is addicted Hydrologist and does nothing but played all day long.    She reports past psychiatric history significant for bipolar disorder.  She reports no history of Suicide Attempts.  She reports no history of Self Injurious Behavior.  She reports ~4 prior psychiatric hospitalizations- last [facility] 01/2023.  Se reports a past medical history significant for Hyperthyroidism.  She reports an allergy to Penicillin- itching and hives.  She reports currently living with her daughter.  She reports graduating high school.  She reports some college but not obtaining a degree.  She reports  she is not currently able to work due to her disease and needs to be on disability.  She reports rare alcohol use only on special occasions.  She reports smoking cigarettes daily.  She reports no illicit substance use. Per chart review- Reports that she had a seizure after being [facility] a [facility].  As per chart review patient was taken to the ER on 11/06/2020 after being found in the bathroom with incontinence and shaking with a seizure-like activity.  She was found at the time by her fianc.   She reports that she needs housing because she is being stalked at her current house.  She reports wanting [employer] housing as there is a habitat house near her.  She reports no other concerns at present.  Associated Signs/Symptoms: Depression Symptoms:  anxiety, disturbed sleep, (Hypo) Manic Symptoms:  Delusions, Distractibility, Flight of Ideas, Impulsivity, Irritable Mood, Labiality of Mood, Anxiety Symptoms:  Excessive Worry, Psychotic Symptoms:  Delusions, Paranoia, PTSD Symptoms: NA Total Time spent with patient: 1 hour  Past Psychiatric History: Bipolar Disorder and ~4 Prior Psychiatric Hospitalizations (last- [facility] 01/2023), and no Suicide Attempts or Self Injurious Behavior.  Is the patient at risk to self? Yes.    Has the patient been a risk to self in the past 6 months? Yes.    Has the patient been a risk to self within the distant past? Yes.    Is the patient a risk to others? Yes.    Has the patient been a risk to others in the past  6 months? Yes.    Has the patient been a risk to others within the distant past? Yes.     Columbia Scale:  Flowsheet Row [facility] ([facility]) [facility] from [facility]10/2023 in [facility] from 01/27/2023 in [facility]  C-SSRS RISK CATEGORY No Risk No Risk No Risk         Prior Inpatient Therapy: Yes.   If yes, describe [facility] 01/2023  Prior Outpatient Therapy: Yes.   If yes, describe Dr. Jess   Alcohol Screening: 1. How often do you have a drink containing alcohol?: Monthly or less 2. How many drinks containing alcohol do you have on a typical day when you are drinking?: 1 or 2 3. How often do you have six or more drinks on one occasion?: Never AUDIT-C Score: 1 4. How often during the last year have you found that you were not able to stop drinking once you had started?: Never 5. How often during the last year have you failed to do what was normally expected from you because of drinking?: Never 6. How often during the last year have you needed a first drink in the morning to get yourself going after a heavy drinking session?: Never 7. How often during the last year have you had a feeling of guilt of remorse after drinking?: Never 8. How often during the last year have you been unable to remember what happened the night before because you had been drinking?: Never 9. Have you or someone else been injured as a result of your drinking?: No 10. Has a relative or friend or a doctor or another health worker been concerned about your drinking or suggested you cut down?: No Alcohol Use Disorder Identification Test Final Score (AUDIT): 1 Alcohol Brief Interventions/Follow-up: Alcohol education/Brief advice Substance Abuse History in the last 12 months:  No. Consequences of Substance Abuse: NA Previous Psychotropic Medications: Yes  Vraylar , Tegretol , Celexa , Flexeril , Latuda , trazodone , Risperdal , Risperdal  Consta, melatonin, Inderal , Seroquel , Ativan , trazodone .  Psychological Evaluations: No  Past Medical History:  Past Medical History:  Diagnosis Date   Bipolar disorder (HCC)    Depression    Former smoker, stopped smoking many years ago    HSV (herpes simplex virus) infection    Hypothyroid    Low TSH level 05/30/2016   Panic attacks    Thyroid   disease     Past Surgical History:  Procedure Laterality Date   ADENOIDECTOMY     OVARIAN CYST SURGERY     TONSILLECTOMY     Family History:  Family History  Problem Relation Age of Onset   Diabetes Father    Heart disease Father    Cancer Maternal Grandmother        breast cancer   Bipolar disorder Mother    Thyroid  disease Neg Hx    Family Psychiatric  History:  Reports No Known Diagnosis', Suicides, or Substance Abuse  Tobacco Screening:  Social History   Tobacco Use  Smoking Status Every Day   Current packs/day: 0.25   Types: Cigarettes  Smokeless Tobacco Never    BH Tobacco Counseling     Are you interested in Tobacco Cessation Medications?  No, patient refused Counseled patient on smoking cessation:  Refused/Declined practical counseling Reason Tobacco Screening Not Completed: No value filed.       Social History:  Social History   Substance and Sexual Activity  Alcohol Use  Not Currently   Comment: denies     Social History   Substance and Sexual Activity  Drug Use No    Additional Social History: Marital status: Single Are you sexually active?: Yes What is your sexual orientation?: heterosexual Has your sexual activity been affected by drugs, alcohol, medication, or emotional stress?: No Does patient have children?: Yes How many children?: 1 How is patient's relationship with their children?: We are good but they are trying to kill us                          Allergies:   Allergies  Allergen Reactions   Penicillins Anaphylaxis, Hives, Itching, Nausea And Vomiting and Other (See Comments)    Has patient had a PCN reaction causing immediate rash, facial/tongue/throat swelling, SOB or lightheadedness with hypotension: No Has patient had a PCN reaction causing severe rash involving mucus membranes or skin necrosis: No Has patient had a PCN reaction that required hospitalization: No Has patient had a PCN reaction occurring within the  last 10 years: No If all of the above answers are NO, then may proceed with Cephalosporin use.   Iodine Other (See Comments)    Reaction unknown   Lactose Intolerance (Gi) Other (See Comments)    GI upset   Methimazole  Other (See Comments)    Reaction unknown   Molds & Smuts Other (See Comments)    Reaction unknown   Lab Results: No results found for this or any previous visit (from the past 48 hours).  Blood Alcohol level:  Lab Results  Component Value Date   ETH <10 03/10/2023   ETH <10 01/27/2023    Metabolic Disorder Labs:  Lab Results  Component Value Date   HGBA1C 5.2 06/27/2022   MPG 103 06/27/2022   MPG 96.8 11/28/2020   Lab Results  Component Value Date   PROLACTIN 13.1 06/28/2019   PROLACTIN 69.3 (H) 09/17/2016   Lab Results  Component Value Date   CHOL 122 06/27/2022   TRIG 162 (H) 06/27/2022   HDL 31 (L) 06/27/2022   CHOLHDL 3.9 06/27/2022   VLDL 32 06/27/2022   LDLCALC 59 06/27/2022   LDLCALC 58 11/28/2020    Current Medications: Current Facility-Administered Medications  Medication Dose Route Frequency Provider Last Rate Last Admin   acetaminophen  (TYLENOL ) tablet 650 mg  650 mg Oral Q6H PRN Brent, Amanda C, NP       alum & mag hydroxide-simeth (MAALOX/MYLANTA) 200-200-20 MG/5ML suspension 30 mL  30 mL Oral Q4H PRN Brent, Amanda C, NP       haloperidol  (HALDOL ) tablet 5 mg  5 mg Oral TID PRN Brent, Amanda C, NP       And   diphenhydrAMINE  (BENADRYL ) capsule 50 mg  50 mg Oral TID PRN Brent, Amanda C, NP       haloperidol  lactate (HALDOL ) injection 5 mg  5 mg Intramuscular TID PRN Thresa Alan BROCKS, NP       And   diphenhydrAMINE  (BENADRYL ) injection 50 mg  50 mg Intramuscular TID PRN Brent, Amanda C, NP       And   LORazepam  (ATIVAN ) injection 2 mg  2 mg Intramuscular TID PRN Brent, Amanda C, NP       haloperidol  lactate (HALDOL ) injection 10 mg  10 mg Intramuscular TID PRN Thresa Alan C, NP       And   diphenhydrAMINE  (BENADRYL ) injection 50  mg  50 mg Intramuscular TID PRN Thresa Alan  C, NP       And   LORazepam  (ATIVAN ) injection 2 mg  2 mg Intramuscular TID PRN Brent, Amanda C, NP       hydrOXYzine  (ATARAX ) tablet 25 mg  25 mg Oral TID PRN Brent, Amanda C, NP   25 mg at 03/14/23 1204   magnesium  hydroxide (MILK OF MAGNESIA) suspension 30 mL  30 mL Oral Daily PRN Brent, Amanda C, NP       methimazole  (TAPAZOLE ) tablet 10 mg  10 mg Oral BID Brent, Amanda C, NP   10 mg at 03/14/23 9142   OLANZapine  (ZYPREXA ) tablet 10 mg  10 mg Oral QHS Melvenia Madelene HERO, NP   10 mg at 03/14/23 0022   traZODone  (DESYREL ) tablet 50 mg  50 mg Oral QHS Melvenia Madelene HERO, NP       PTA Medications: Medications Prior to Admission  Medication Sig Dispense Refill Last Dose/Taking   lithium carbonate (LITHOBID) 300 MG ER tablet Take 300 mg by mouth 2 (two) times daily.       Musculoskeletal: Strength & Muscle Tone: within normal limits Gait & Station: normal Patient leans: N/A            Psychiatric Specialty Exam:  Presentation  General Appearance:  Disheveled  Eye Contact: Fair  Speech: Pressured  Speech Volume: Increased  Handedness: Right   Mood and Affect  Mood: Labile  Affect: Labile   Thought Process  Thought Processes: Disorganized  Duration of Psychotic Symptoms: unknown Past Diagnosis of Schizophrenia or Psychoactive disorder: No  Descriptions of Associations:Tangential  Orientation:Full (Time, Place and Person)  Thought Content:Tangential; Rumination  Hallucinations:Hallucinations: None  Ideas of Reference:Paranoia; Percusatory  Suicidal Thoughts:Suicidal Thoughts: No  Homicidal Thoughts:Homicidal Thoughts: No   Sensorium  Memory: Immediate Fair  Judgment: Impaired  Insight: Lacking   Executive Functions  Concentration: Poor  Attention Span: Poor  Recall: Fair  Fund of Knowledge: Fair  Language: Good   Psychomotor Activity  Psychomotor Activity: Psychomotor  Activity: Normal   Assets  Assets: Communication Skills; Resilience   Sleep  Sleep: Sleep: Fair    Physical Exam: Physical Exam Constitutional:      General: She is not in acute distress.    Appearance: Normal appearance. She is normal weight. She is not ill-appearing or toxic-appearing.  HENT:     Head: Normocephalic and atraumatic.  Pulmonary:     Effort: Pulmonary effort is normal.  Musculoskeletal:        General: Normal range of motion.  Neurological:     General: No focal deficit present.     Mental Status: She is alert.    Review of Systems  Respiratory:  Negative for cough and shortness of breath.   Cardiovascular:  Negative for chest pain.  Gastrointestinal:  Negative for abdominal pain, constipation, diarrhea, nausea and vomiting.  Neurological:  Negative for dizziness, weakness and headaches.  Psychiatric/Behavioral:  Positive for depression. Negative for hallucinations and suicidal ideas. The patient is nervous/anxious.    Blood pressure 136/63, pulse 84, temperature 98.8 F (37.1 C), temperature source Oral, resp. rate 18, height 5' 4 (1.626 m), weight 83.3 kg, SpO2 99%. Body mass index is 31.51 kg/m.  Treatment Plan Summary: Daily contact with patient to assess and evaluate symptoms and progress in treatment and Medication management  Veronica Perez is a 48 yr old female who presented on 2/4 to [facility] by [employer] under IVC due to paranoia and threatening her neighbors, she was admitted to [facility] on 2/8.  PPHx is significant for Bipolar Disorder and ~4 Prior Psychiatric Hospitalizations (last- [facility] 01/2023), and no Suicide Attempts or Self Injurious Behavior.   Veronica Perez continues to be paranoid and disorganized in her thinking.  She was started on Zyprexa  while at [facility] and has been taking It so we will continue with this for now but will need to consider if switching to a antipsychotic with an LAI given her history of noncompliance.  She is taking her  Methimazole  and we will continue to monitor her for any signs of Thyroid  storm which  she does not currently have any.  We will uphold the IVC.  As [employer] initiated the IVC and she has declined for any one to be contacted we cannot contact anyone for collateral at this time.  We will order a Lipid Panel and an A1c.  We will continue to monitor.    Bipolar Disorder, severe with Psychosis: -Continue Zyprexa  10 mg QHS for mood stability and psychosis -Continue Agitation Protocol: Haldol /Ativan /Benadryl    Hyperthyroidism: -Continue Methimazole  10 mg BID   -Continue PRN's: Tylenol , Maalox, Atarax , Milk of Magnesia, Trazodone    Observation Level/Precautions:  15 minute checks  Laboratory:  CMP: WNL except BUN: 5,  CBC: WNL,  TSH: <0.01,  T3 free: 8.1,  T4 free: 2.54,  Urine Preg: Neg,  UDS: THC pos,  EKG: NSR with Qtc: 450 Lipid Panel and A1c ordred  Psychotherapy:    Medications:  Zyprexa   Consultations:    Discharge Concerns:    Estimated LOS: 7-9 days  Other:     Physician Treatment Plan for Primary Diagnosis: Bipolar I disorder, current or most recent episode manic, with psychotic features (HCC) Long Term Goal(s): Improvement in symptoms so as ready for discharge  Short Term Goals: Ability to identify and develop effective coping behaviors will improve, Ability to maintain clinical measurements within normal limits will improve, Compliance with prescribed medications will improve, and Ability to identify triggers associated with substance abuse/mental health issues will improve  Physician Treatment Plan for Secondary Diagnosis: Principal Problem:   Bipolar I disorder, current or most recent episode manic, with psychotic features (HCC) Active Problems:   Hyperthyroidism  Long Term Goal(s): Improvement in symptoms so as ready for discharge  Short Term Goals: Ability to identify and develop effective coping behaviors will improve, Ability to maintain clinical measurements within normal  limits will improve, Compliance with prescribed medications will improve, and Ability to identify triggers associated with substance abuse/mental health issues will improve  I certify that inpatient services furnished can reasonably be expected to improve the patient's condition.    Veronica GORMAN Rosser, MD 2/8/20253:16 PM

## 2023-03-14 NOTE — Progress Notes (Signed)
   03/14/23 0528  15 Minute Checks  Location Bedroom  Visual Appearance Calm  Behavior Sleeping  Sleep (Behavioral Health Patients Only)  Calculate sleep? (Click Yes once per 24 hr at 0600 safety check) Yes  Documented sleep last 24 hours 5

## 2023-03-14 NOTE — Progress Notes (Signed)
   03/14/23 2040  Psych Admission Type (Psych Patients Only)  Admission Status Involuntary  Psychosocial Assessment  Patient Complaints Confusion;Hyperactivity  Eye Contact Fair  Facial Expression Anxious;Animated  Affect Apprehensive;Preoccupied  Speech Rapid  Interaction Assertive  Motor Activity Fidgety  Appearance/Hygiene Disheveled  Behavior Characteristics Appropriate to situation  Mood Preoccupied  Thought Process  Coherency Disorganized  Content Preoccupation;Paranoia  Delusions Persecutory;Paranoid  Perception Derealization  Hallucination None reported or observed  Judgment Impaired  Confusion Mild  Danger to Self  Current suicidal ideation? Denies  Description of Suicide Plan denies  Self-Injurious Behavior No self-injurious ideation or behavior indicators observed or expressed   Agreement Not to Harm Self Yes  Description of Agreement verbal  Danger to Others  Danger to Others None reported or observed

## 2023-03-14 NOTE — Progress Notes (Signed)
   03/14/23 1200  Psych Admission Type (Psych Patients Only)  Admission Status Involuntary  Psychosocial Assessment  Patient Complaints Suspiciousness;Confusion  Eye Contact Fair  Facial Expression Worried  Affect Anxious;Preoccupied  Speech Rapid  Interaction Assertive  Motor Activity Fidgety  Appearance/Hygiene Disheveled  Behavior Characteristics Fidgety  Mood Preoccupied  Thought Process  Coherency Disorganized  Content Preoccupation;Paranoia  Delusions Paranoid;Persecutory  Perception Derealization  Hallucination None reported or observed  Judgment Impaired  Confusion Mild  Danger to Self  Current suicidal ideation? Denies  Danger to Others  Danger to Others None reported or observed   Dar Note: Patient presents with anxious affect and irritable mood.  Appears disorganized, paranoid and rambling about her neighbor trying to kill her.  Medication given as prescribed.  Routine safety checks maintained.  Vistaril  25 mg given for complain of anxiety with good effect.  Support and encouragement offered as needed.  Patient is safe on and off the unit.
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
## 2023-03-14 NOTE — Progress Notes (Signed)
   03/14/23 2122  Vital Signs  Temp 97.7 F (36.5 C)  Temp Source Oral  Pulse Rate 91  Pulse Rate Source Monitor  BP (!) 131/55  BP Location Left Arm  BP Method Automatic  Patient Position (if appropriate) Sitting  Oxygen Therapy  SpO2 100 %

## 2023-03-14 NOTE — Progress Notes (Signed)
 Pt requests daily weights as well as protein shakes because she said you have a thyroid  disease that causes her to lose 8 lbs/day.
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
## 2023-03-14 NOTE — Tx Team (Signed)
 Initial Treatment Plan 03/14/2023 12:49 AM Veronica Perez FMW:[id number]    PATIENT STRESSORS: Legal issue   Marital or family conflict   Medication change or noncompliance   Traumatic event     PATIENT STRENGTHS: Average or above average intelligence  Communication skills  Physical Health    PATIENT IDENTIFIED PROBLEMS: Psychosis  anxiety          Better math skills         DISCHARGE CRITERIA:  Improved stabilization in mood, thinking, and/or behavior Motivation to continue treatment in a less acute level of care Need for constant or close observation no longer present Verbal commitment to aftercare and medication compliance  PRELIMINARY DISCHARGE PLAN: Attend aftercare/continuing care group Outpatient therapy Placement in alternative living arrangements  PATIENT/FAMILY INVOLVEMENT: This treatment plan has been presented to and reviewed with the patient, Veronica Perez.  The patient and family have been given the opportunity to ask questions and make suggestions.  Effie Naomie Norris, RN 03/14/2023, 12:49 AM

## 2023-03-14 NOTE — Progress Notes (Signed)
 Pt with complaint of tachycardia , tingling in fingers and toes.  Pt feels that the Tapazole  is doing this and wants doctor to address this.  Pt VS taken, see Vital sign flowsheet.  BP slightly elevated probably due to Pt preoccupation with somatic complaints, all other VSS.  Pt appears anxious but no acute distress noted.  Will continue to monitor.

## 2023-03-14 NOTE — Plan of Care (Signed)
   Problem: Education: Goal: Verbalization of understanding the information provided will improve Outcome: Progressing

## 2023-03-15 DIAGNOSIS — F312 Bipolar disorder, current episode manic severe with psychotic features: Secondary | ICD-10-CM | POA: Diagnosis not present

## 2023-03-15 LAB — LIPID PANEL
Cholesterol: 129 mg/dL (ref 0–200)
HDL: 41 mg/dL (ref >40)
LDL Cholesterol: 68 mg/dL (ref 0–99)
Total CHOL/HDL Ratio: 3.1 {ratio}
Triglycerides: 100 mg/dL (ref <150)
VLDL: 20 mg/dL (ref 0–40)

## 2023-03-15 NOTE — Group Note (Signed)
 Date:  03/15/2023 Time:  1:58 AM  Group Topic/Focus:  Wrap-Up Group:   The focus of this group is to help patients review their daily goal of treatment and discuss progress on daily workbooks.    Participation Level:  Did Not Attend  Participation Quality:  Did Not Attend  Affect:   Did Not Attend  Cognitive:   Did Not Attend  Insight: None  Engagement in Group:   Did Not Attend  Modes of Intervention:   Did Not Attend  Additional Comments:  Pt was encouraged to attend wrap up group but did not attend  Lonni Na 03/15/2023, 1:58 AM

## 2023-03-15 NOTE — Progress Notes (Signed)
   03/15/23 0800  Psych Admission Type (Psych Patients Only)  Admission Status Involuntary  Psychosocial Assessment  Patient Complaints Confusion  Eye Contact Fair;Intense  Facial Expression Anxious;Animated  Affect Apprehensive;Preoccupied  Speech Rapid  Interaction Assertive  Motor Activity Fidgety  Appearance/Hygiene Disheveled  Behavior Characteristics Appropriate to situation  Mood Preoccupied  Thought Process  Coherency Disorganized  Content Preoccupation  Delusions Persecutory;Somatic  Perception Derealization  Hallucination None reported or observed  Judgment Impaired  Confusion Mild  Danger to Self  Current suicidal ideation? Denies  Self-Injurious Behavior No self-injurious ideation or behavior indicators observed or expressed   Agreement Not to Harm Self Yes  Description of Agreement Verbal  Danger to Others  Danger to Others None reported or observed

## 2023-03-15 NOTE — Progress Notes (Signed)
   03/15/23 2107  Psych Admission Type (Psych Patients Only)  Admission Status Involuntary  Psychosocial Assessment  Patient Complaints Confusion  Eye Contact Fair;Intense  Facial Expression Animated;Anxious  Affect Preoccupied  Speech Rapid;Pressured  Interaction Assertive  Motor Activity Fidgety;Restless  Appearance/Hygiene Disheveled  Behavior Characteristics Appropriate to situation  Mood Preoccupied  Thought Process  Coherency Disorganized  Content Preoccupation  Delusions Paranoid;Persecutory;Somatic  Perception Derealization  Hallucination None reported or observed  Judgment Impaired  Confusion Mild  Danger to Self  Current suicidal ideation? Denies  Description of Suicide Plan denies  Self-Injurious Behavior No self-injurious ideation or behavior indicators observed or expressed   Agreement Not to Harm Self Yes  Description of Agreement verbal  Danger to Others  Danger to Others None reported or observed

## 2023-03-15 NOTE — Progress Notes (Signed)
   03/15/23 0530  15 Minute Checks  Location Bedroom  Visual Appearance Calm  Behavior Sleeping  Sleep (Behavioral Health Patients Only)  Calculate sleep? (Click Yes once per 24 hr at 0600 safety check) Yes  Documented sleep last 24 hours 8.25
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
## 2023-03-16 ENCOUNTER — Encounter (HOSPITAL_COMMUNITY): Payer: Self-pay

## 2023-03-16 DIAGNOSIS — F3112 Bipolar disorder, current episode manic without psychotic features, moderate: Secondary | ICD-10-CM | POA: Diagnosis not present

## 2023-03-16 LAB — HEMOGLOBIN A1C
Hgb A1c MFr Bld: 4.7 % — ABNORMAL LOW (ref 4.8–5.6)
Mean Plasma Glucose: 88.19 mg/dL

## 2023-03-16 NOTE — Progress Notes (Signed)
 D: Pt denies SI/HI/AVH. Pt is pleasant and cooperative. Pt very talkative, pt focused on filters in the building. Pt visible on the unit.   A: Pt was offered support and encouragement. Pt was given scheduled medications. Pt was encourage to attend groups. Q 15 minute checks were done for safety.   R:Pt attends groups and interacts well with peers and staff. Pt is taking medication. Pt has no complaints.Pt receptive to treatment and safety maintained on unit.     03/16/23 2100  Psych Admission Type (Psych Patients Only)  Admission Status Involuntary  Psychosocial Assessment  Patient Complaints Confusion;Worrying;Restlessness  Eye Contact Fair  Facial Expression Anxious;Animated  Affect Preoccupied  Speech Pressured;Rapid  Interaction Assertive  Motor Activity Restless;Fidgety  Appearance/Hygiene Unremarkable  Behavior Characteristics Appropriate to situation;Cooperative;Restless  Mood Preoccupied;Anxious;Suspicious  Aggressive Behavior  Effect No apparent injury  Thought Process  Coherency Disorganized;Circumstantial  Content Preoccupation  Delusions Paranoid;Somatic  Perception Derealization  Hallucination None reported or observed  Judgment Impaired  Confusion Mild  Danger to Self  Current suicidal ideation? Denies  Danger to Others  Danger to Others None reported or observed

## 2023-03-16 NOTE — Group Note (Signed)
 LCSW Group Therapy Note   Group Date: 03/16/2023 Start Time: 1300 End Time: 1400   Participation:  patient was present and polite.   Although she participated in the conversation, her input was not on topic.  Title:  Healing Hearts: A Safe Space for Grief  Objective: The objective of this class is to create a compassionate environment where participants can process their grief, explore different stages of grief, and discover ways to honor their loved ones through personal rituals. 3 Goals: Provide a safe and supportive space where participants feel comfortable sharing their feelings and experiences of grief without judgment. Educate participants about the stages of grief and emphasize that there is no "right" way to grieve or a fixed timeline for healing. Introduce the concept of rituals as a means to process grief, allowing individuals to honor their loved ones in a personal and meaningful way.  Summary: In Healing Hearts: A Safe Space for Grief, we explored the unique and personal journey of grief, emphasizing that everyone experiences it differently.  We discussed the five stages of grief (denial, anger, bargaining, depression, and acceptance), with the understanding that grief is not linear.  Rituals were introduced as a way to help cope with loss, offering comfort and connection through meaningful actions such as lighting candles or taking memory walks.  Participants were encouraged to express their emotions, focus on self-care, and reflect on moments of gratitude for their loved ones, recognizing that healing is a process and there is no timeline for grief.  Therapeutic Modalities:  Elements of CBT (cognitive restructuring)  Elements of DBT (mindfulness, distress tolerance, radical acceptance)    Veronica Perez O Hollan Philipp, LCSWA 03/16/2023  2:03 PM

## 2023-03-16 NOTE — Plan of Care (Signed)
  Problem: Education: Goal: Emotional status will improve Outcome: Progressing Goal: Mental status will improve Outcome: Progressing Goal: Verbalization of understanding the information provided will improve Outcome: Progressing   Problem: Activity: Goal: Interest or engagement in activities will improve Outcome: Progressing   Problem: Coping: Goal: Ability to verbalize frustrations and anger appropriately will improve Outcome: Progressing   Problem: Safety: Goal: Periods of time without injury will increase Outcome: Progressing   Problem: Education: Goal: Will be free of psychotic symptoms Outcome: Progressing   Problem: Coping: Goal: Will verbalize feelings Outcome: Progressing
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
## 2023-03-16 NOTE — Group Note (Signed)
 Recreation Therapy Group Note   Group Topic:Anger Management  Group Date: 03/16/2023 Start Time: 1025 End Time: 1045 Facilitators: Aline Wesche-McCall, LRT,CTRS Location: [address] Dayroom   Group Topic: Anger Management  Goal Area(s) Addresses:  Patient will define anger.  Patient will identify triggers to anger.  Patient will identify coping skills to deal with anger.   Intervention: Worksheets  Activity: Patient discussed anger and what makes them angry. Patients were given a worksheet with a thermometer. Patients were to rank the things that get them angry from 1 to 10. Patients then had to identify coping skills that would help them deal with their anger. Patients would then share their experiences with the group.  Education: Anger Management, Discharge Planning   Education Outcome: Acknowledges education/In group clarification offered/Needs additional education.   Affect/Mood: N/A   Participation Level: Did not attend    Clinical Observations/Individualized Feedback:      Plan: Continue to engage patient in RT group sessions 2-3x/week.   Christee Mervine-McCall, LRT,CTRS 03/16/2023 1:26 PM

## 2023-03-16 NOTE — Progress Notes (Signed)
   03/16/23 0549  15 Minute Checks  Location Bedroom  Visual Appearance Calm  Behavior Sleeping  Sleep (Behavioral Health Patients Only)  Calculate sleep? (Click Yes once per 24 hr at 0600 safety check) Yes  Documented sleep last 24 hours 5.25

## 2023-03-16 NOTE — Plan of Care (Signed)
  Problem: Activity: Goal: Interest or engagement in activities will improve Outcome: Progressing   Problem: Coping: Goal: Ability to verbalize frustrations and anger appropriately will improve Outcome: Progressing   Problem: Education: Goal: Will be free of psychotic symptoms Outcome: Progressing

## 2023-03-16 NOTE — Plan of Care (Signed)
   Problem: Education: Goal: Knowledge of [facility] Education information/materials will improve Outcome: Progressing   Problem: Education: Goal: Verbalization of understanding the information provided will improve Outcome: Progressing

## 2023-03-16 NOTE — Group Note (Signed)
 Date:  03/16/2023 Time:  9:40 PM  Group Topic/Focus:  Wrap-Up Group:   The focus of this group is to help patients review their daily goal of treatment and discuss progress on daily workbooks.    Participation Level:  Active  Participation Quality:  Appropriate  Affect:  Appropriate  Cognitive:  Appropriate  Insight: Appropriate  Engagement in Group:  Engaged  Modes of Intervention:  Education and Exploration  Additional Comments:  Patient attended and participated in group tonight Her goal was to see her daughter and to speak with her mother. She did meet her goal today  Veronica Perez 03/16/2023, 9:40 PM

## 2023-03-16 NOTE — Progress Notes (Signed)
   03/16/23 0800  Psych Admission Type (Psych Patients Only)  Admission Status Involuntary  Psychosocial Assessment  Patient Complaints Confusion;Worrying  Biomedical scientist;Intense  Facial Expression Animated;Anxious  Affect Preoccupied  Speech Rapid;Pressured  Interaction Assertive  Motor Activity Fidgety;Restless  Appearance/Hygiene Disheveled  Behavior Characteristics Appropriate to situation  Mood Preoccupied  Thought Process  Coherency Disorganized  Content Preoccupation  Delusions Paranoid;Persecutory;Somatic  Perception Derealization  Hallucination None reported or observed  Judgment Impaired  Confusion Mild  Danger to Self  Current suicidal ideation? Denies  Self-Injurious Behavior No self-injurious ideation or behavior indicators observed or expressed   Agreement Not to Harm Self Yes  Description of Agreement verbal  Danger to Others  Danger to Others None reported or observed

## 2023-03-16 NOTE — Progress Notes (Signed)
 Conversation with patient:  CSW attempted to discuss collateral contacts with patient, but her speech was disorganized and she talked excessively.  She appeared preoccupied with concerns about safety in her apartment and shared incoherent and chaotic stories.    Patient asked for help with finding an apartment.  CSW explained that it can be difficult to find housing and provided patient with a phone number for the housing authority in [place] (explaining that they didn't accept anyone on the waiting list during the summer but it was worth to call again) and [place] (explaining that they were accepting applications in the summer).  Patient said that her mother is elderly and lives in [place], and she doesn't want to go to [employer].  Patient was unable to provide any collateral contacts.   Timara Loma, LCSWA 03/16/23

## 2023-03-16 NOTE — BH IP Treatment Plan (Signed)
 Interdisciplinary Treatment and Diagnostic Plan Update  03/16/2023 Time of Session: 11:33AM Veronica Perez MRN: [id number]  Principal Diagnosis: Bipolar I disorder, current or most recent episode manic, with psychotic features (HCC)  Secondary Diagnoses: Principal Problem:   Bipolar I disorder, current or most recent episode manic, with psychotic features (HCC) Active Problems:   Hyperthyroidism   Current Medications:  Current Facility-Administered Medications  Medication Dose Route Frequency Provider Last Rate Last Admin   acetaminophen  (TYLENOL ) tablet 650 mg  650 mg Oral Q6H PRN Brent, Amanda C, NP   650 mg at 03/15/23 0931   alum & mag hydroxide-simeth (MAALOX/MYLANTA) 200-200-20 MG/5ML suspension 30 mL  30 mL Oral Q4H PRN Brent, Amanda C, NP       haloperidol  (HALDOL ) tablet 5 mg  5 mg Oral TID PRN Brent, Amanda C, NP       And   diphenhydrAMINE  (BENADRYL ) capsule 50 mg  50 mg Oral TID PRN Brent, Amanda C, NP       haloperidol  lactate (HALDOL ) injection 5 mg  5 mg Intramuscular TID PRN Brent, Amanda C, NP       And   diphenhydrAMINE  (BENADRYL ) injection 50 mg  50 mg Intramuscular TID PRN Davia Erps, NP       And   LORazepam  (ATIVAN ) injection 2 mg  2 mg Intramuscular TID PRN Brent, Amanda C, NP       haloperidol  lactate (HALDOL ) injection 10 mg  10 mg Intramuscular TID PRN Davia Erps, NP       And   diphenhydrAMINE  (BENADRYL ) injection 50 mg  50 mg Intramuscular TID PRN Brent, Amanda C, NP       And   LORazepam  (ATIVAN ) injection 2 mg  2 mg Intramuscular TID PRN Brent, Amanda C, NP       hydrOXYzine  (ATARAX ) tablet 25 mg  25 mg Oral TID PRN Brent, Amanda C, NP   25 mg at 03/15/23 2012   magnesium  hydroxide (MILK OF MAGNESIA) suspension 30 mL  30 mL Oral Daily PRN Brent, Amanda C, NP       methimazole  (TAPAZOLE ) tablet 10 mg  10 mg Oral BID Brent, Amanda C, NP   10 mg at 03/14/23 1716   OLANZapine  (ZYPREXA ) tablet 10 mg  10 mg Oral QHS Al Alias, NP   10 mg at  03/15/23 2012   traZODone  (DESYREL ) tablet 50 mg  50 mg Oral QHS Al Alias, NP   50 mg at 03/15/23 2012   traZODone  (DESYREL ) tablet 50 mg  50 mg Oral Once Bobbitt, Shalon E, NP       PTA Medications: Medications Prior to Admission  Medication Sig Dispense Refill Last Dose/Taking   lithium carbonate (LITHOBID) 300 MG ER tablet Take 300 mg by mouth 2 (two) times daily.       Patient Stressors: Legal issue   Marital or family conflict   Medication change or noncompliance   Traumatic event    Patient Strengths: Average or above average intelligence  Communication skills  Physical Health   Treatment Modalities: Medication Management, Group therapy, Case management,  1 to 1 session with clinician, Psychoeducation, Recreational therapy.   Physician Treatment Plan for Primary Diagnosis: Bipolar I disorder, current or most recent episode manic, with psychotic features (HCC) Long Term Goal(s): Improvement in symptoms so as ready for discharge   Short Term Goals: Ability to identify and develop effective coping behaviors will improve Ability to maintain clinical measurements within normal limits will  improve Compliance with prescribed medications will improve Ability to identify triggers associated with substance abuse/mental health issues will improve  Medication Management: Evaluate patient's response, side effects, and tolerance of medication regimen.  Therapeutic Interventions: 1 to 1 sessions, Unit Group sessions and Medication administration.  Evaluation of Outcomes: Not Progressing  Physician Treatment Plan for Secondary Diagnosis: Principal Problem:   Bipolar I disorder, current or most recent episode manic, with psychotic features (HCC) Active Problems:   Hyperthyroidism  Long Term Goal(s): Improvement in symptoms so as ready for discharge   Short Term Goals: Ability to identify and develop effective coping behaviors will improve Ability to maintain clinical  measurements within normal limits will improve Compliance with prescribed medications will improve Ability to identify triggers associated with substance abuse/mental health issues will improve     Medication Management: Evaluate patient's response, side effects, and tolerance of medication regimen.  Therapeutic Interventions: 1 to 1 sessions, Unit Group sessions and Medication administration.  Evaluation of Outcomes: Not Progressing   RN Treatment Plan for Primary Diagnosis: Bipolar I disorder, current or most recent episode manic, with psychotic features (HCC) Long Term Goal(s): Knowledge of disease and therapeutic regimen to maintain health will improve  Short Term Goals: Ability to verbalize frustration and anger appropriately will improve, Ability to participate in decision making will improve, Ability to verbalize feelings will improve, Ability to disclose and discuss suicidal ideas, Ability to identify and develop effective coping behaviors will improve, and Compliance with prescribed medications will improve  Medication Management: RN will administer medications as ordered by provider, will assess and evaluate patient's response and provide education to patient for prescribed medication. RN will report any adverse and/or side effects to prescribing provider.  Therapeutic Interventions: 1 on 1 counseling sessions, Psychoeducation, Medication administration, Evaluate responses to treatment, Monitor vital signs and CBGs as ordered, Perform/monitor CIWA, COWS, AIMS and Fall Risk screenings as ordered, Perform wound care treatments as ordered.  Evaluation of Outcomes: Not Progressing   LCSW Treatment Plan for Primary Diagnosis: Bipolar I disorder, current or most recent episode manic, with psychotic features (HCC) Long Term Goal(s): Safe transition to appropriate next level of care at discharge, Engage patient in therapeutic group addressing interpersonal concerns.  Short Term Goals:  Engage patient in aftercare planning with referrals and resources, Increase ability to appropriately verbalize feelings, Increase emotional regulation, Facilitate acceptance of mental health diagnosis and concerns, Facilitate patient progression through stages of change regarding substance use diagnoses and concerns, and Increase skills for wellness and recovery  Therapeutic Interventions: Assess for all discharge needs, 1 to 1 time with Social worker, Explore available resources and support systems, Assess for adequacy in community support network, Educate family and significant other(s) on suicide prevention, Complete Psychosocial Assessment, Interpersonal group therapy.  Evaluation of Outcomes: Not Progressing   Progress in Treatment: Attending groups: No. Participating in groups: No. Taking medication as prescribed: Yes. Toleration medication: Yes. Family/Significant other contact made: No, will contact:  consents pending Patient understands diagnosis: No. Discussing patient identified problems/goals with staff: Yes. Medical problems stabilized or resolved: Yes. Denies suicidal/homicidal ideation: Yes. Issues/concerns per patient self-inventory: No. Other: None  New problem(s) identified: No, Describe:  None  New Short Term/Long Term Goal(s): medication stabilization, elimination of SI thoughts, development of comprehensive mental wellness plan.   Patient Goals:  "I don't know. My daughter and I need as habitat for humanity house"  Discharge Plan or Barriers: Patient recently admitted. CSW will continue to follow and assess for appropriate referrals and possible  discharge planning.    Reason for Continuation of Hospitalization: Anxiety Delusions  Mania Medication stabilization  Estimated Length of Stay: 3-5 Days  Last 3 [place] Suicide Severity Risk Score: Flowsheet Row [facility] (Current) [facility] from 03/10/2023 in  [facility] ED from [facility]/27/2023 in [facility] Department at [facility]  C-SSRS RISK CATEGORY No Risk No Risk No Risk       Last PHQ 2/9 Scores:    11/12/2021    1:28 PM 12/26/2020   10:38 AM 05/27/2016    2:22 PM  Depression screen PHQ 2/9  Decreased Interest 0 1 3  Down, Depressed, Hopeless 0 1 3  PHQ - 2 Score 0 2 6  Altered sleeping  2 1  Tired, decreased energy  2 3  Change in appetite  0 0  Feeling bad or failure about yourself   0 0  Trouble concentrating  1 0  Moving slowly or fidgety/restless  0 0  Suicidal thoughts   0  PHQ-9 Score  7 10  Difficult doing work/chores  Somewhat difficult     Scribe for Treatment Team: Vernestine Gondola, LCSW 03/16/2023 2:27 PM

## 2023-03-17 ENCOUNTER — Other Ambulatory Visit (HOSPITAL_COMMUNITY): Payer: Self-pay

## 2023-03-17 ENCOUNTER — Telehealth (HOSPITAL_COMMUNITY): Payer: Self-pay | Admitting: Pharmacy Technician

## 2023-03-17 DIAGNOSIS — F312 Bipolar disorder, current episode manic severe with psychotic features: Secondary | ICD-10-CM | POA: Diagnosis not present

## 2023-03-17 MED ORDER — METHIMAZOLE 5 MG PO TABS
5.0000 mg | ORAL_TABLET | Freq: Two times a day (BID) | ORAL | Status: DC
  Administered 2023-03-17 – 2023-03-19 (×4): 5 mg via ORAL
  Filled 2023-03-17 (×8): qty 1

## 2023-03-17 MED ORDER — LORAZEPAM 0.5 MG PO TABS
0.5000 mg | ORAL_TABLET | Freq: Two times a day (BID) | ORAL | Status: DC | PRN
  Administered 2023-03-17 – 2023-03-24 (×10): 0.5 mg via ORAL
  Filled 2023-03-17 (×10): qty 1

## 2023-03-17 MED ORDER — OLANZAPINE 10 MG PO TABS
20.0000 mg | ORAL_TABLET | Freq: Every day | ORAL | Status: DC
  Administered 2023-03-17 – 2023-03-26 (×10): 20 mg via ORAL
  Filled 2023-03-17 (×13): qty 2

## 2023-03-17 NOTE — Progress Notes (Signed)
[facility] MD Progress Note  03/17/2023 1:08 PM Veronica Perez  MRN:  [id number] Subjective:   48 year old Caucasian female, lives with her 9 year old daughter. History of thyrotoxicosis and schizoaffective disorder bipolar type. Involuntarily committed on account of worsening psychosis. Preoccupied with belief that staff members at her apartment with her trying to kill her and her daughter. She has had these delusions for a long time. She was contemplating contacting [employer]. She has not been adherent with her thyroid medication and her mood stabilizers in the community. Routine labs significant for hyperactive thyroid gland.  UDS positive for THC.  Chart reviewed today.  Patient discussed at multidisciplinary team meeting.  Nursing staff reports that she slept for 9.6 hours.  She has been refusing her antithyroid medication.  She has been taking her psychotropic medications.  She remains odd and bizarre.  She remains invested in her persecutory themes.  At interview today, patient remains preoccupied with persecutory and paranoid delusions.  She talked at length about how her neighbors were trying to monitor her.  She jumps from one thing to another.  Tells me that her glasses are meant for sleep only as it shields her from computer rays and melanin deposition.  I focused on medication management with.  She finally agreed to take a lower dose of methimazole though she continues to believe that it causes tachycardia.  I explored a beta-blocker with her which she is refusing at this time.  I have encouraged her to take the lower dose and to stay on her psychotropic medications.   Principal Problem: Bipolar I disorder, current or most recent episode manic, with psychotic features (HCC) Diagnosis: Principal Problem:   Bipolar I disorder, current or most recent episode manic, with psychotic features (HCC) Active Problems:   Hyperthyroidism  Total Time spent with patient: 30 minutes  Past Psychiatric  History:  See H&P  Past Medical History:  Past Medical History:  Diagnosis Date   Bipolar disorder (HCC)    Depression    Former smoker, stopped smoking many years ago    HSV (herpes simplex virus) infection    Hypothyroid    Low TSH level 05/30/2016   Panic attacks    Thyroid disease     Past Surgical History:  Procedure Laterality Date   ADENOIDECTOMY     OVARIAN CYST SURGERY     TONSILLECTOMY     Family History:  Family History  Problem Relation Age of Onset   Diabetes Father    Heart disease Father    Cancer Maternal Grandmother        breast cancer   Bipolar disorder Mother    Thyroid disease Neg Hx    Family Psychiatric  History:  See H&P  Social History:  Social History   Substance and Sexual Activity  Alcohol Use Not Currently   Comment: denies     Social History   Substance and Sexual Activity  Drug Use No    Social History   Socioeconomic History   Marital status: Single    Spouse name: Not on file   Number of children: 1   Years of education: Not on file   Highest education level: High school graduate  Occupational History   Not on file  Tobacco Use   Smoking status: Every Day    Current packs/day: 0.25    Types: Cigarettes   Smokeless tobacco: Never  Vaping Use   Vaping status: Never Used  Substance and Sexual Activity   Alcohol use: Not  Currently    Comment: denies   Drug use: No   Sexual activity: Yes    Birth control/protection: None  Other Topics Concern   Not on file  Social History Narrative   Not on file   Social Drivers of Health   Financial Resource Strain: Not on file  Food Insecurity: Patient Unable To Answer (03/10/2023)   Hunger Vital Sign    Worried About Running Out of Food in the Last Year: Patient unable to answer    Ran Out of Food in the Last Year: Patient unable to answer  Transportation Needs: Patient Unable To Answer (03/10/2023)   PRAPARE - Transportation    Lack of Transportation (Medical): Patient  unable to answer    Lack of Transportation (Non-Medical): Patient unable to answer  Physical Activity: Not on file  Stress: Not on file  Social Connections: Unknown (10/29/2022)   Received from [facility]   Social Network    Social Network: Not on file   Additional Social History:    Current Medications: Current Facility-Administered Medications  Medication Dose Route Frequency Provider Last Rate Last Admin   acetaminophen (TYLENOL) tablet 650 mg  650 mg Oral Q6H PRN Howie Ill, NP   650 mg at 03/15/23 0931   alum & mag hydroxide-simeth (MAALOX/MYLANTA) 200-200-20 MG/5ML suspension 30 mL  30 mL Oral Q4H PRN Howie Ill, NP       haloperidol (HALDOL) tablet 5 mg  5 mg Oral TID PRN Howie Ill, NP       And   diphenhydrAMINE (BENADRYL) capsule 50 mg  50 mg Oral TID PRN Howie Ill, NP       haloperidol lactate (HALDOL) injection 5 mg  5 mg Intramuscular TID PRN Howie Ill, NP       And   diphenhydrAMINE (BENADRYL) injection 50 mg  50 mg Intramuscular TID PRN Howie Ill, NP       And   LORazepam (ATIVAN) injection 2 mg  2 mg Intramuscular TID PRN Howie Ill, NP       haloperidol lactate (HALDOL) injection 10 mg  10 mg Intramuscular TID PRN Howie Ill, NP       And   diphenhydrAMINE (BENADRYL) injection 50 mg  50 mg Intramuscular TID PRN Howie Ill, NP       And   LORazepam (ATIVAN) injection 2 mg  2 mg Intramuscular TID PRN Howie Ill, NP       hydrOXYzine (ATARAX) tablet 25 mg  25 mg Oral TID PRN Howie Ill, NP   25 mg at 03/17/23 1256   magnesium hydroxide (MILK OF MAGNESIA) suspension 30 mL  30 mL Oral Daily PRN Howie Ill, NP       methimazole (TAPAZOLE) tablet 5 mg  5 mg Oral BID Elvis Laufer, Delight Ovens, MD       OLANZapine (ZYPREXA) tablet 20 mg  20 mg Oral QHS Rifka Ramey, Delight Ovens, MD       traZODone (DESYREL) tablet 50 mg  50 mg Oral QHS Dixon, Rashaun M, NP   50 mg at 03/16/23 2038   traZODone (DESYREL) tablet 50 mg  50 mg  Oral Once Bobbitt, Shalon E, NP        Lab Results:  Results for orders placed or performed during the hospital encounter of 03/13/23 (from the past 48 hours)  Lipid panel     Status: None   Collection Time: 03/15/23  6:23 PM  Result Value Ref Range   Cholesterol 129 0 - 200 mg/dL   Triglycerides 034 <742 mg/dL   HDL 41 >59 mg/dL   Total CHOL/HDL Ratio 3.1 RATIO   VLDL 20 0 - 40 mg/dL   LDL Cholesterol 68 0 - 99 mg/dL    Comment:        Total Cholesterol/HDL:CHD Risk Coronary Heart Disease Risk Table                     Men   Women  1/2 Average Risk   3.4   3.3  Average Risk       5.0   4.4  2 X Average Risk   9.6   7.1  3 X Average Risk  23.4   11.0        Use the calculated Patient Ratio above and the CHD Risk Table to determine the patient's CHD Risk.        ATP III CLASSIFICATION (LDL):  <100     mg/dL   Optimal  563-875  mg/dL   Near or Above                    Optimal  130-159  mg/dL   Borderline  643-329  mg/dL   High  >518     mg/dL   Very High Performed at [facility], [address], [place], [place] [address]   Hemoglobin A1c     Status: Abnormal   Collection Time: 03/15/23  6:23 PM  Result Value Ref Range   Hgb A1c MFr Bld 4.7 (L) 4.8 - 5.6 %    Comment: (NOTE) Pre diabetes:          5.7%-6.4%  Diabetes:              >6.4%  Glycemic control for   <7.0% adults with diabetes    Mean Plasma Glucose 88.19 mg/dL    Comment: Performed at [facility], [address], [place], [place] [address]    Blood Alcohol level:  Lab Results  Component Value Date   [facility] <10 03/10/2023   ETH <10 01/27/2023    Metabolic Disorder Labs: Lab Results  Component Value Date   HGBA1C 4.7 (L) 03/15/2023   MPG 88.19 03/15/2023   MPG 103 06/27/2022   Lab Results  Component Value Date   PROLACTIN 13.1 06/28/2019   PROLACTIN 69.3 (H) 09/17/2016   Lab Results  Component Value Date   CHOL 129 03/15/2023   TRIG 100 03/15/2023   HDL 41  03/15/2023   CHOLHDL 3.1 03/15/2023   VLDL 20 03/15/2023   LDLCALC 68 03/15/2023   LDLCALC 59 06/27/2022    Physical Findings: AIMS:  , ,  ,  ,    CIWA:    COWS:     Musculoskeletal: Strength & Muscle Tone: within normal limits Gait & Station: normal Patient leans: N/A  Psychiatric Specialty Exam:  Presentation  General Appearance:  Casually dressed, limited grooming, not in acute distress.  Eye Contact: Moderate  Speech: Pressured but not loud.  Mood and Affect  Mood: Irritable and overwhelmed by her perceived persecution.  Affect: Blunted and mood congruent.  Thought Process  Thought Processes: Increased speed of thought.  Descriptions of Associations: Flight of ideas  Orientation:Full (Time, Place and Person)  Thought Content: Persecutory and paranoid delusion.  No homicidal thoughts.  No thoughts of violence.  No suicidal thoughts.  Hallucinations: No hallucination in any  modality.  Sensorium  Memory: Did not assess as patient is psychotic.  Judgment: Impaired  Insight: Limited as she does not believe that she has a mental illness.  She however is willing to take psychotropic medication.  Executive Functions  Concentration: Poor  Attention Span: Distractible  Recall: Did not assess at this time as patient is psychotic.  Fund of Knowledge: Unable to fully explore as she is psychotic.  Language: Good   Psychomotor Activity  Slightly increased psychomotor activity.  Physical Exam: Physical Exam ROS Blood pressure 101/61, pulse 68, temperature (!) 97.5 F (36.4 C), temperature source Oral, resp. rate 18, height 5\' 4"  (1.626 m), weight 83.3 kg, SpO2 99%. Body mass index is 31.51 kg/m.   Treatment Plan Summary: Patient is still floridly manic and psychotic.  She is having invested in her delusions of being persecuted.  She has not been taking her antithyroid medication.  Psychoeducation on importance of her thyroid medication was  provided today.  She has agreed to take half the dose.  We will make adjustments as below and evaluate her further.  1.  Increase olanzapine to 20 mg at bedtime. 2.  Decrease methimazole to 5 mg twice daily. 3.  As needed lorazepam 0.5 mg twice daily. 4.  Continue to monitor mood behavior and interaction with others. 5.  Continue to encourage unit groups and therapeutic activities. 6.  Social worker will coordinate discharge and aftercare planning.   Georgiann Cocker, MD 03/17/2023, 1:08 PM

## 2023-03-17 NOTE — Plan of Care (Signed)
  Problem: Coping: Goal: Ability to verbalize frustrations and anger appropriately will improve Outcome: Progressing   Problem: Activity: Goal: Interest or engagement in activities will improve Outcome: Progressing   Problem: Safety: Goal: Periods of time without injury will increase Outcome: Progressing

## 2023-03-17 NOTE — Progress Notes (Signed)
   03/17/23 0800  Psych Admission Type (Psych Patients Only)  Admission Status Involuntary  Psychosocial Assessment  Patient Complaints Confusion;Worrying;Restlessness  Eye Contact Fair  Facial Expression Anxious;Animated  Affect Preoccupied  Speech Pressured;Rapid  Interaction Assertive  Motor Activity Restless;Fidgety  Appearance/Hygiene Unremarkable  Behavior Characteristics Appropriate to situation;Cooperative  Mood Preoccupied;Anxious;Suspicious  Aggressive Behavior  Effect No apparent injury  Thought Process  Coherency Disorganized;Circumstantial  Content Preoccupation  Delusions Paranoid;Somatic  Perception Derealization  Hallucination None reported or observed  Judgment Impaired  Confusion Mild  Danger to Self  Current suicidal ideation? Denies  Danger to Others  Danger to Others None reported or observed

## 2023-03-17 NOTE — Telephone Encounter (Signed)
Patient Product/process development scientist completed.    The patient is insured through [facility]. Patient has Medicare and is not eligible for a copay card, but may be able to apply for patient assistance or Medicare RX Payment Plan (Patient Must reach out to their plan, if eligible for payment plan), if available.    Ran test claim for olanzapine (Zyprexa) 20 mg and the current 30 day co-pay is $0.00.   This test claim was processed through [facility]- copay amounts may vary at other pharmacies due to pharmacy/plan contracts, or as the patient moves through the different stages of their insurance plan.     Roland Earl, CPHT Pharmacy Technician III Certified Patient Advocate [facility] Patient Advocate Team Direct Number: [phone]  Fax: [phone]

## 2023-03-17 NOTE — Progress Notes (Signed)
   03/17/23 2000  Psych Admission Type (Psych Patients Only)  Admission Status Involuntary  Psychosocial Assessment  Patient Complaints Anxiety;Worrying;Suspiciousness  Eye Contact Fair  Facial Expression Anxious;Animated  Affect Preoccupied  Speech Pressured;Rapid  Interaction Assertive  Motor Activity Restless;Fidgety  Appearance/Hygiene Unremarkable  Behavior Characteristics Restless;Hyperactive  Mood Suspicious;Preoccupied;Pleasant  Aggressive Behavior  Effect No apparent injury  Thought Process  Coherency Disorganized;Circumstantial  Content Preoccupation  Delusions Paranoid;Somatic  Perception Derealization  Hallucination None reported or observed  Judgment Impaired  Confusion Mild  Danger to Self  Current suicidal ideation? Denies  Danger to Others  Danger to Others None reported or observed

## 2023-03-17 NOTE — Progress Notes (Signed)
Collateral contact Clayborn Heron (daughter) [phone]  Patient allowed CSW to speak with her dad, mom and daughter (who all live together).  CSW called and patient's father, Dorinda Hill, answered the phone.   He asked CSW to speak with patient's daughter.    Conversation with daughter:  When asked why patient came to the hospital, daughter said she wasn't sure if her mom had been IVC'd (Involuntary Committed) or convicted on charges of misdemeanor larceny.  The police wanted to speak with mom, but she didn't open the door for them.  When asked if she spoke with her mom on the phone when she was in the hospital, she said she spoke with her today and mom is not doing well.  When asked where her mom would go after discharge, she said that her mom doesn't have a home.  She was evicted from her apartment and has upcoming court dates related to the eviction and stealing cameras from the apartment property.  When asked if her mom could come to her grandparents' home, daughter said there is no room there.  Suddenly, patient's brother, Jodi Mourning, got on the phone.  CSW informed him that she would need to ask patient for permission to speak with him.  He said that he lives with the grandparents too, there is no room in his home, and there is also a restraining order forbidding her from coming there.  He said that patient is not doing well when she is not taking her medications and is causing trouble wherever she goes.  CSW didn't respond to this information, but said she would ask patient for permission to speak with him.  Daughter said that was all patient's brother wanted to say and he didn't need to talk about anything else, so permission wasn't necessary.    Neeko Pharo, LCSWA 03/17/23

## 2023-03-17 NOTE — Plan of Care (Signed)
  Problem: Education: Goal: Emotional status will improve Outcome: Progressing Goal: Mental status will improve Outcome: Progressing   Problem: Activity: Goal: Interest or engagement in activities will improve Outcome: Progressing Goal: Sleeping patterns will improve Outcome: Progressing   Problem: Coping: Goal: Ability to demonstrate self-control will improve Outcome: Progressing   Problem: Safety: Goal: Periods of time without injury will increase Outcome: Progressing   Problem: Coping: Goal: Coping ability will improve Outcome: Progressing Goal: Will verbalize feelings Outcome: Progressing

## 2023-03-17 NOTE — [facility] Group Notes (Signed)
Adult Psychoeducational Group Note  Date:  03/17/2023 Time:  9:30 PM  Group Topic/Focus:  Wrap-Up Group:   The focus of this group is to help patients review their daily goal of treatment and discuss progress on daily workbooks.  Participation Level:  Did Not Attend  Participation Quality:   n/a  Affect:   n/a  Cognitive:   n/a  Insight: None  Engagement in Group:   n/a  Modes of Intervention:   n/a  Additional Comments:  Carlisia did not attend wrap up group   Charna Busman Long 03/17/2023, 9:30 PM

## 2023-03-18 DIAGNOSIS — F312 Bipolar disorder, current episode manic severe with psychotic features: Secondary | ICD-10-CM | POA: Diagnosis not present

## 2023-03-18 NOTE — Plan of Care (Signed)
Problem: Education: Goal: Emotional status will improve Outcome: Progressing Goal: Mental status will improve Outcome: Progressing   Problem: Activity: Goal: Sleeping patterns will improve Outcome: Progressing   Problem: Safety: Goal: Periods of time without injury will increase Outcome: Progressing

## 2023-03-18 NOTE — Group Note (Signed)
Recreation Therapy Group Note   Group Topic:Coping Skills  Group Date: 03/18/2023 Start Time: 1036 End Time: 1110 Facilitators: Ebunoluwa Gernert-McCall, LRT,CTRS Location: [address]   Group Topic: Coping Skills   Goal Area(s) Addresses: Patient will define what a coping skill is. Patient will work with peer to create a list of healthy coping skills beginning with each letter of the alphabet. Patient will successfully identify positive coping skills they can use post d/c.  Patient will acknowledge benefit(s) of using learned coping skills post d/c.   Intervention: Group work   Activity: Coping A to Z. Patient asked to identify what a coping skill is and when they use them. Patients with Clinical research associate discussed healthy versus unhealthy coping skills. Next patients were given a blank worksheet titled "Coping Skills A-Z" and asked to pair up with a peer. Partners were instructed to come up with at least one positive coping skill per letter of the alphabet, addressing a specific challenge (ex: stress, anger, anxiety, depression, grief, doubt, isolation, self-harm/suicidal thoughts, substance use). Patients were given 15 minutes to brainstorm with their peer, before ideas were presented to the large group. Patients and LRT debriefed on the importance of coping skill selection based on situation and back-up plans when a skill tried is not effective. At the end of group, patients were given an handout of alphabetized strategies to keep for future reference.   Education: Pharmacologist, Scientist, physiological, Discharge Planning.    Education Outcome: Acknowledges education/Verbalizes understanding/In group clarification offered/Additional education needed   Affect/Mood: Appropriate   Participation Level: Engaged   Participation Quality: Independent   Behavior: Appropriate   Speech/Thought Process: Focused   Insight: Good   Judgement: Good   Modes of Intervention: Worksheet   Patient  Response to Interventions:  Engaged   Education Outcome:  In group clarification offered    Clinical Observations/Individualized Feedback: Pt was bright and engaged during group session. Pt explained coping skills can be used to keep you calm. Pt also gave examples of coping skills such as aerobics, music, reading, etc. Pt had to identify coping skills for isolation. Pt came up with some coping skills such as thrifting, working out, tennis, camping, swimming, [employer], star gazing, natural science center, learning a second language, dancing and board games.     Plan: Continue to engage patient in RT group sessions 2-3x/week.   Mqrjette Ido Wollman-McCall, LRT,CTRS 03/18/2023 12:38 PM

## 2023-03-18 NOTE — Progress Notes (Signed)
Patient ID: Veronica Perez, female   DOB: 1975/07/23, 48 y.o.   MRN: [id number]   Pt reports anxiety and requested medication. Pt is observed anxious, fidgety, and tangential. Ativan 0.5 mg PRN given.

## 2023-03-18 NOTE — Progress Notes (Signed)
   03/18/23 2000  Psych Admission Type (Psych Patients Only)  Admission Status Involuntary  Psychosocial Assessment  Patient Complaints Anxiety  Eye Contact Fair  Facial Expression Anxious;Animated  Affect Preoccupied  Speech Pressured;Rapid  Interaction Assertive  Motor Activity Restless;Fidgety  Appearance/Hygiene Unremarkable  Behavior Characteristics Fidgety  Mood Suspicious;Preoccupied;Pleasant  Aggressive Behavior  Effect No apparent injury  Thought Process  Coherency Disorganized;Circumstantial  Content Preoccupation  Delusions Paranoid;Somatic  Perception Derealization  Hallucination None reported or observed  Judgment Impaired  Confusion Mild  Danger to Self  Current suicidal ideation? Denies  Danger to Others  Danger to Others None reported or observed

## 2023-03-18 NOTE — [facility] Group Notes (Signed)
Adult Psychoeducational Group Note  Date:  03/18/2023 Time:  9:43 PM  Group Topic/Focus:  Wrap-Up Group:   The focus of this group is to help patients review their daily goal of treatment and discuss progress on daily workbooks.  Participation Level:    Participation Quality:    Affect:    Cognitive:      Insight: Appropriate  Engagement in Group:  Engaged  Modes of Intervention:  Discussion  Additional Comments:  Juel said her da 10. She trying to stop being nervious and anxious she homeless dont know where she will live.  Charna Busman Long 03/18/2023, 9:43 PM

## 2023-03-18 NOTE — Plan of Care (Signed)
Problem: Activity: Goal: Interest or engagement in activities will improve Outcome: Progressing   Problem: Health Behavior/Discharge Planning: Goal: Compliance with treatment plan for underlying cause of condition will improve Outcome: Progressing   Problem: Safety: Goal: Periods of time without injury will increase Outcome: Progressing

## 2023-03-18 NOTE — Progress Notes (Signed)
   03/18/23 1125  Psych Admission Type (Psych Patients Only)  Admission Status Involuntary  Psychosocial Assessment  Patient Complaints Anxiety  Eye Contact Fair  Facial Expression Anxious  Affect Preoccupied  Speech Pressured;Rapid  Interaction Assertive  Motor Activity Fidgety  Appearance/Hygiene Unremarkable  Behavior Characteristics Fidgety  Mood Preoccupied;Pleasant  Thought Process  Coherency Circumstantial;Tangential  Content Preoccupation  Delusions Paranoid  Perception Derealization  Hallucination None reported or observed  Judgment Impaired  Confusion None  Danger to Self  Current suicidal ideation? Denies  Self-Injurious Behavior No self-injurious ideation or behavior indicators observed or expressed   Agreement Not to Harm Self Yes  Description of Agreement verbally contracts for safety  Danger to Others  Danger to Others None reported or observed
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
## 2023-03-19 DIAGNOSIS — F312 Bipolar disorder, current episode manic severe with psychotic features: Secondary | ICD-10-CM | POA: Diagnosis not present

## 2023-03-19 MED ORDER — METHIMAZOLE 10 MG PO TABS
10.0000 mg | ORAL_TABLET | Freq: Two times a day (BID) | ORAL | Status: DC
Start: 2023-03-19 — End: 2023-03-27
  Administered 2023-03-19 – 2023-03-27 (×16): 10 mg via ORAL
  Filled 2023-03-19 (×20): qty 1

## 2023-03-19 NOTE — Plan of Care (Signed)
Problem: Education: Goal: Emotional status will improve Outcome: Progressing Goal: Mental status will improve Outcome: Progressing

## 2023-03-19 NOTE — Progress Notes (Signed)
Patient is making gradual progress.  She is still preoccupied with persecutory and paranoid delusions.  Psychomotor activity is relatively better today.  I explored optimization of methimazole to 10 mg twice a day with her.  She is agreeable with adjustment.  We will keep her antipsychotic the same and evaluate her further.  Not stable for a lower level of care.

## 2023-03-19 NOTE — [facility] Group Notes (Signed)
The focus of this group is to help patients establish daily goals to achieve during treatment and discuss how the patient can incorporate goal setting into their daily lives to aide in recovery.    Scale 1-10  5(back pain)    Goal:Talk to case worker

## 2023-03-19 NOTE — Group Note (Signed)
Occupational Therapy Group Note  Group Topic: Sleep Hygiene  Group Date: 03/19/2023 Start Time: 1430 End Time: 1450 Facilitators: Ted Mcalpine, OT   Group Description: Group encouraged increased participation and engagement through topic focused on sleep hygiene. Patients reflected on the quality of sleep they typically receive and identified areas that need improvement. Group was given background information on sleep and sleep hygiene, including common sleep disorders. Group members also received information on how to improve one's sleep and introduced a sleep diary as a tool that can be utilized to track sleep quality over a length of time. Group session ended with patients identifying one or more strategies they could utilize or implement into their sleep routine in order to improve overall sleep quality.        Therapeutic Goal(s):  Identify one or more strategies to improve overall sleep hygiene  Identify one or more areas of sleep that are negatively impacted (sleep too much, too little, etc)     Participation Level: Engaged   Participation Quality: Minimal Cues   Behavior: Hyperverbal   Speech/Thought Process: Ideas of reference  and Loose association    Affect/Mood: Appropriate   Insight: Limited   Judgement: Limited      Modes of Intervention: Education  Patient Response to Interventions:  Attentive   Plan: Continue to engage patient in OT groups 2 - 3x/week.  03/19/2023  Ted Mcalpine, OT   Kerrin Champagne, OT
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
## 2023-03-19 NOTE — Progress Notes (Signed)
   03/19/23 2100  Psych Admission Type (Psych Patients Only)  Admission Status Involuntary  Psychosocial Assessment  Patient Complaints Anxiety  Eye Contact Fair  Facial Expression Anxious;Animated  Affect Preoccupied  Speech Pressured;Rapid  Interaction Assertive  Motor Activity Restless;Fidgety  Appearance/Hygiene Unremarkable  Behavior Characteristics Anxious  Mood Preoccupied  Aggressive Behavior  Effect No apparent injury  Thought Process  Coherency Disorganized;Circumstantial  Content Preoccupation  Delusions Paranoid;Somatic  Perception Derealization  Hallucination None reported or observed  Judgment Impaired  Confusion Mild  Danger to Self  Current suicidal ideation? Denies  Danger to Others  Danger to Others None reported or observed

## 2023-03-19 NOTE — Plan of Care (Signed)
Problem: Education: Goal: Emotional status will improve Outcome: Progressing Goal: Mental status will improve Outcome: Progressing   Problem: Activity: Goal: Interest or engagement in activities will improve Outcome: Progressing Goal: Sleeping patterns will improve Outcome: Progressing

## 2023-03-20 ENCOUNTER — Encounter (HOSPITAL_COMMUNITY): Payer: Self-pay

## 2023-03-20 DIAGNOSIS — F312 Bipolar disorder, current episode manic severe with psychotic features: Secondary | ICD-10-CM | POA: Diagnosis not present

## 2023-03-20 NOTE — Progress Notes (Signed)
   03/20/23 2115  Psych Admission Type (Psych Patients Only)  Admission Status Involuntary  Psychosocial Assessment  Patient Complaints None  Eye Contact Fair  Facial Expression Animated  Affect Preoccupied  Speech Soft  Interaction Assertive  Motor Activity Other (Comment) (wnl)  Appearance/Hygiene Improved  Behavior Characteristics Cooperative;Appropriate to situation;Calm  Mood Preoccupied  Thought Process  Coherency Circumstantial  Content Preoccupation  Delusions Paranoid  Perception Depersonalization  Hallucination None reported or observed  Judgment Poor  Confusion Mild  Danger to Self  Current suicidal ideation? Denies

## 2023-03-20 NOTE — BH IP Treatment Plan (Signed)
Interdisciplinary Treatment and Diagnostic Plan Update  03/20/2023 Time of Session: 1240PM - UPDATE TAYTEN HEBER MRN: [id number]  Principal Diagnosis: Bipolar I disorder, current or most recent episode manic, with psychotic features (HCC)  Secondary Diagnoses: Principal Problem:   Bipolar I disorder, current or most recent episode manic, with psychotic features (HCC) Active Problems:   Hyperthyroidism   Current Medications:  Current Facility-Administered Medications  Medication Dose Route Frequency Provider Last Rate Last Admin   acetaminophen (TYLENOL) tablet 650 mg  650 mg Oral Q6H PRN Howie Ill, NP   650 mg at 03/15/23 0931   alum & mag hydroxide-simeth (MAALOX/MYLANTA) 200-200-20 MG/5ML suspension 30 mL  30 mL Oral Q4H PRN Howie Ill, NP       haloperidol (HALDOL) tablet 5 mg  5 mg Oral TID PRN Howie Ill, NP       And   diphenhydrAMINE (BENADRYL) capsule 50 mg  50 mg Oral TID PRN Howie Ill, NP       haloperidol lactate (HALDOL) injection 5 mg  5 mg Intramuscular TID PRN Howie Ill, NP       And   diphenhydrAMINE (BENADRYL) injection 50 mg  50 mg Intramuscular TID PRN Howie Ill, NP       And   LORazepam (ATIVAN) injection 2 mg  2 mg Intramuscular TID PRN Howie Ill, NP       haloperidol lactate (HALDOL) injection 10 mg  10 mg Intramuscular TID PRN Howie Ill, NP       And   diphenhydrAMINE (BENADRYL) injection 50 mg  50 mg Intramuscular TID PRN Howie Ill, NP       And   LORazepam (ATIVAN) injection 2 mg  2 mg Intramuscular TID PRN Howie Ill, NP       LORazepam (ATIVAN) tablet 0.5 mg  0.5 mg Oral BID PRN Georgiann Cocker, MD   0.5 mg at 03/20/23 1355   magnesium hydroxide (MILK OF MAGNESIA) suspension 30 mL  30 mL Oral Daily PRN Howie Ill, NP       methimazole (TAPAZOLE) tablet 10 mg  10 mg Oral BID [name], Delight Ovens, MD   10 mg at 03/20/23 0848   OLANZapine (ZYPREXA) tablet 20 mg  20 mg Oral QHS [name],  Delight Ovens, MD   20 mg at 03/19/23 2038   traZODone (DESYREL) tablet 50 mg  50 mg Oral QHS [name], NP   50 mg at 03/19/23 2038   traZODone (DESYREL) tablet 50 mg  50 mg Oral Once Bobbitt, Shalon E, NP       PTA Medications: Medications Prior to Admission  Medication Sig Dispense Refill Last Dose/Taking   lithium carbonate (LITHOBID) 300 MG ER tablet Take 300 mg by mouth 2 (two) times daily.       Patient Stressors: Legal issue   Marital or family conflict   Medication change or noncompliance   Traumatic event    Patient Strengths: Average or above average intelligence  Communication skills  Physical Health   Treatment Modalities: Medication Management, Group therapy, Case management,  1 to 1 session with clinician, Psychoeducation, Recreational therapy.   Physician Treatment Plan for Primary Diagnosis: Bipolar I disorder, current or most recent episode manic, with psychotic features (HCC) Long Term Goal(s): Improvement in symptoms so as ready for discharge   Short Term Goals: Ability to identify and develop effective coping behaviors will improve Ability to maintain clinical measurements within normal  limits will improve Compliance with prescribed medications will improve Ability to identify triggers associated with substance abuse/mental health issues will improve  Medication Management: Evaluate patient's response, side effects, and tolerance of medication regimen.  Therapeutic Interventions: 1 to 1 sessions, Unit Group sessions and Medication administration.  Evaluation of Outcomes: Progressing  Physician Treatment Plan for Secondary Diagnosis: Principal Problem:   Bipolar I disorder, current or most recent episode manic, with psychotic features (HCC) Active Problems:   Hyperthyroidism  Long Term Goal(s): Improvement in symptoms so as ready for discharge   Short Term Goals: Ability to identify and develop effective coping behaviors will improve Ability to  maintain clinical measurements within normal limits will improve Compliance with prescribed medications will improve Ability to identify triggers associated with substance abuse/mental health issues will improve     Medication Management: Evaluate patient's response, side effects, and tolerance of medication regimen.  Therapeutic Interventions: 1 to 1 sessions, Unit Group sessions and Medication administration.  Evaluation of Outcomes: Progressing   RN Treatment Plan for Primary Diagnosis: Bipolar I disorder, current or most recent episode manic, with psychotic features (HCC) Long Term Goal(s): Knowledge of disease and therapeutic regimen to maintain health will improve  Short Term Goals: Ability to remain free from injury will improve, Ability to verbalize frustration and anger appropriately will improve, Ability to participate in decision making will improve, Ability to disclose and discuss suicidal ideas, and Ability to identify and develop effective coping behaviors will improve  Medication Management: RN will administer medications as ordered by provider, will assess and evaluate patient's response and provide education to patient for prescribed medication. RN will report any adverse and/or side effects to prescribing provider.  Therapeutic Interventions: 1 on 1 counseling sessions, Psychoeducation, Medication administration, Evaluate responses to treatment, Monitor vital signs and CBGs as ordered, Perform/monitor CIWA, COWS, AIMS and Fall Risk screenings as ordered, Perform wound care treatments as ordered.  Evaluation of Outcomes: Progressing   LCSW Treatment Plan for Primary Diagnosis: Bipolar I disorder, current or most recent episode manic, with psychotic features (HCC) Long Term Goal(s): Safe transition to appropriate next level of care at discharge, Engage patient in therapeutic group addressing interpersonal concerns.  Short Term Goals: Engage patient in aftercare planning with  referrals and resources, Increase ability to appropriately verbalize feelings, Facilitate acceptance of mental health diagnosis and concerns, and Identify triggers associated with mental health/substance abuse issues  Therapeutic Interventions: Assess for all discharge needs, 1 to 1 time with Social worker, Explore available resources and support systems, Assess for adequacy in community support network, Educate family and significant other(s) on suicide prevention, Complete Psychosocial Assessment, Interpersonal group therapy.  Evaluation of Outcomes: Progressing   Progress in Treatment: Attending groups: Yes. Participating in groups: Yes. Taking medication as prescribed: Yes. Toleration medication: Yes. Family/Significant other contact made: Yes, contacted: Clayborn Heron - daughter [phone] Patient understands diagnosis: No. Discussing patient identified problems/goals with staff: Yes. Medical problems stabilized or resolved: Yes. Denies suicidal/homicidal ideation: Yes. Issues/concerns per patient self-inventory: No. Other: None   New problem(s) identified: No, Describe:  None   New Short Term/Long Term Goal(s): medication stabilization, elimination of SI thoughts, development of comprehensive mental wellness plan.    Patient Goals:  "I don't know. My daughter and I need as [facility] house"   Discharge Plan or Barriers: Patient recently admitted. CSW will continue to follow and assess for appropriate referrals and possible discharge planning.      Reason for Continuation of Hospitalization: Anxiety Delusions  Mania Medication stabilization   Estimated Length of Stay: 3-5 Days  Last 3 [place] Suicide Severity Risk Score: Flowsheet Row Ad[facility] (Current) [facility] from 03/10/2023 in [facility] ED from 01/27/2023 in [facility] Department at [facility]  C-SSRS RISK  CATEGORY No Risk No Risk No Risk       Last PHQ 2/9 Scores:    11/12/2021    1:28 PM 12/26/2020   10:38 AM 05/27/2016    2:22 PM  Depression screen PHQ 2/9  Decreased Interest 0 1 3  Down, Depressed, Hopeless 0 1 3  PHQ - 2 Score 0 2 6  Altered sleeping  2 1  Tired, decreased energy  2 3  Change in appetite  0 0  Feeling bad or failure about yourself   0 0  Trouble concentrating  1 0  Moving slowly or fidgety/restless  0 0  Suicidal thoughts   0  PHQ-9 Score  7 10  Difficult doing work/chores  Somewhat difficult     Scribe for Treatment Team: Esmeralda Arthur 03/20/2023 2:35 PM

## 2023-03-20 NOTE — [facility] Group Notes (Signed)
Spirituality group facilitated by Kathleen Argue, BCC.  Group Description: Group focused on topic of hope. Patients participated in facilitated discussion around topic, connecting with one another around experiences and definitions for hope. Group members engaged with visual explorer photos, reflecting on what hope looks like for them today. Group engaged in discussion around how their definitions of hope are present today in hospital.  Modalities: Psycho-social ed, Adlerian, Narrative, MI  Patient Progress: Veronica Perez attended group and actively engaged and participated in group conversation and activities. She became agitated and her voice became louder as she spoke about her daughter.  She left group and rejoined and was able to participate again.

## 2023-03-20 NOTE — Plan of Care (Signed)
  Problem: Education: Goal: Emotional status will improve Outcome: Progressing Goal: Verbalization of understanding the information provided will improve Outcome: Progressing   Problem: Activity: Goal: Sleeping patterns will improve Outcome: Progressing   Problem: Coping: Goal: Ability to demonstrate self-control will improve Outcome: Progressing   Problem: Health Behavior/Discharge Planning: Goal: Compliance with treatment plan for underlying cause of condition will improve Outcome: Progressing

## 2023-03-20 NOTE — Progress Notes (Signed)
   03/20/23 1007  Psych Admission Type (Psych Patients Only)  Admission Status Involuntary  Psychosocial Assessment  Patient Complaints Irritability;Suspiciousness  Eye Contact Fair  Facial Expression Anxious;Animated  Affect Preoccupied  Speech Rapid  Interaction Assertive  Motor Activity Restless;Fidgety  Appearance/Hygiene Improved  Behavior Characteristics Cooperative;Anxious  Mood Preoccupied  Thought Process  Coherency Circumstantial  Content Preoccupation  Delusions Paranoid;Somatic  Perception Derealization  Hallucination None reported or observed  Judgment Poor  Confusion Mild  Danger to Self  Current suicidal ideation? Denies  Danger to Others  Danger to Others None reported or observed

## 2023-03-20 NOTE — Group Note (Signed)
Date:  03/20/2023 Time:  8:38 PM  Group Topic/Focus:  Wrap-Up Group:   The focus of this group is to help patients review their daily goal of treatment and discuss progress on daily workbooks.    Participation Level:  Active  Participation Quality:  Appropriate  Affect:  Appropriate  Cognitive:  Appropriate  Insight: Appropriate  Engagement in Group:  Developing/Improving  Modes of Intervention:  Discussion  Additional Comments:  Pt stated her goal for today was to focus on her treatment plan and discuss a medication issue with her treatment team. Pt stated she accomplished her goals today. Pt stated she talked with her doctor and with her social worker about her care today. Pt rated her overall day a 10. Pt stated she was able to contact her father, and her daughter today which improved her overall day. Pt stated she felt better about herself tonight. Pt stated she was able to attend all meals today. Pt stated she took all medications provided today. Pt stated her appetite was pretty good today. Pt rated her sleep last night was pretty good. Pt stated the goal tonight was to get some rest. Pt stated she had some physical pain tonight. Pt stated she had some mild lower back pain. Pt rated the mild lower back pain a 4 on the pain level scale. Pt nurse was updated on the situation. Pt deny visual hallucinations and auditory issues tonight. Pt denies thoughts of harming herself or others. Pt stated she would alert staff if anything changed.  Felipa Furnace 03/20/2023, 8:38 PM
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
## 2023-03-20 NOTE — Progress Notes (Signed)
Conversation with patient:  Patient said that she doesn't know where she could go at discharge and doesn't have any family members to stay with.  CSW provided patient with a list of homeless shelters.  Read Drivers, LCSWA 03/20/2023
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
## 2023-03-21 NOTE — Progress Notes (Signed)
   03/21/23 2050  Psych Admission Type (Psych Patients Only)  Admission Status Involuntary  Psychosocial Assessment  Patient Complaints Anxiety  Eye Contact Fair  Facial Expression Animated  Affect Preoccupied  Speech Soft  Interaction Assertive  Motor Activity Other (Comment) (wnl)  Appearance/Hygiene Improved  Behavior Characteristics Cooperative;Appropriate to situation  Mood Pleasant  Thought Process  Coherency Circumstantial  Content Preoccupation  Delusions Paranoid  Perception Depersonalization  Hallucination None reported or observed  Judgment Poor  Confusion Mild  Danger to Self  Current suicidal ideation? Denies
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
## 2023-03-21 NOTE — Plan of Care (Signed)
   Problem: Education: Goal: Emotional status will improve Outcome: Progressing

## 2023-03-21 NOTE — Progress Notes (Signed)
UPDATE:  CSW engaged with Bobbye Morton, MD regarding PT current presentation and IVC status. Pt IVC expires today, however MD reported that pt is still actively psychotic, requiring agitation PRN meds and beating on the walls. Doctor is putting in an order to extend pt IVC.   Steffanie Dunn LCSWA 03/21/2023 @0922 

## 2023-03-21 NOTE — Group Note (Signed)
Date:  03/21/2023 Time:  8:23 PM  Group Topic/Focus:  Wrap-Up Group:   The focus of this group is to help patients review their daily goal of treatment and discuss progress on daily workbooks.    Participation Level:  Active  Participation Quality:  Appropriate  Affect:  Appropriate  Cognitive:  Appropriate  Insight: Appropriate  Engagement in Group:  Engaged  Modes of Intervention:  Education and Exploration  Additional Comments:  Patient attended and participated in group tonight.. She reports that today she went to her meals today and hang ot in the day room watching television.  Lita Mains [facility] 03/21/2023, 8:23 PM

## 2023-03-22 NOTE — Plan of Care (Signed)
Pt presents with animated expression and preoccupied affect. Circumstantial with mild confusion and paranoid delusions. Denies SI, HI, and AVH. Reports lower back pain 10/10, refused PRN pain medication. Emotional support was provided and pt went back to room to relax. Assertive, cooperative, and pleasant in interactions with staff. Observed in the milieu and dayroom throughout the day. Medication compliant with no adverse reactions. Safety checks maintained at q 15 minutes. Support, encouragement, and reassurance offered to the pt.   Problem: Education: Goal: Emotional status will improve Outcome: Progressing Goal: Mental status will improve Outcome: Progressing Goal: Verbalization of understanding the information provided will improve Outcome: Progressing   Problem: Safety: Goal: Periods of time without injury will increase Outcome: Progressing
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
## 2023-03-22 NOTE — Plan of Care (Signed)
  Problem: Education: Goal: Emotional status will improve Outcome: Progressing   Problem: Activity: Goal: Interest or engagement in activities will improve Outcome: Progressing   Problem: Coping: Goal: Ability to demonstrate self-control will improve Outcome: Progressing

## 2023-03-23 NOTE — Progress Notes (Signed)
D: Patient is alert, oriented, paranoid, delusional, and cooperative. Denies SI, HI, AVH, and verbally contracts for safety.    A: Scheduled medications administered per MD order. Support provided. Patient educated on safety on the unit and medications. Routine safety checks every 15 minutes. Patient stated understanding to tell nurse about any new physical symptoms. Patient understands to tell staff of any needs.     R: No adverse drug reactions noted. Patient verbally contracts for safety. Patient remains safe at this time and will continue to monitor.    03/23/23 1100  Psych Admission Type (Psych Patients Only)  Admission Status Involuntary  Psychosocial Assessment  Patient Complaints Anxiety;Suspiciousness  Eye Contact Fair  Facial Expression Animated;Anxious  Affect Preoccupied  Speech Logical/coherent  Interaction Assertive  Motor Activity Fidgety  Appearance/Hygiene Unremarkable  Behavior Characteristics Cooperative  Mood Preoccupied;Pleasant  Thought Process  Coherency Circumstantial  Content Preoccupation  Delusions Paranoid  Perception Depersonalization  Hallucination None reported or observed  Judgment Poor  Confusion Mild  Danger to Self  Current suicidal ideation? Denies  Self-Injurious Behavior No self-injurious ideation or behavior indicators observed or expressed   Agreement Not to Harm Self Yes  Description of Agreement verbal  Danger to Others  Danger to Others None reported or observed

## 2023-03-23 NOTE — Group Note (Signed)
LCSW Group Therapy Note     Group Date: 03/23/2023 Start Time: 1300 End Time: 1400     Topic:  Anxiety (Cycle of Avoidance)   Due to the acuity and complex discharge plans, group was not held. Patient was provided therapeutic worksheets and asked to meet with CSW as needed.   Alfredia Ferguson Jhamir Pickup, LCSWA 03/23/2023  1:13 PM
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
## 2023-03-23 NOTE — Plan of Care (Signed)

## 2023-03-23 NOTE — Progress Notes (Signed)
   03/23/23 2130  Psych Admission Type (Psych Patients Only)  Admission Status Involuntary  Psychosocial Assessment  Patient Complaints Anxiety;Suspiciousness  Eye Contact Fair  Facial Expression Anxious;Animated  Affect Preoccupied  Speech Logical/coherent  Interaction Assertive  Motor Activity Fidgety;Pacing  Appearance/Hygiene Unremarkable  Behavior Characteristics Cooperative  Mood Pleasant;Preoccupied  Aggressive Behavior  Effect No apparent injury  Thought Process  Coherency Circumstantial  Content Preoccupation  Delusions Paranoid  Perception Depersonalization  Hallucination None reported or observed  Judgment Poor  Confusion WDL  Danger to Self  Current suicidal ideation? Denies  Danger to Others  Danger to Others None reported or observed

## 2023-03-23 NOTE — Plan of Care (Signed)
  Problem: Education: Goal: Emotional status will improve Outcome: Progressing Goal: Mental status will improve Outcome: Progressing   Problem: Activity: Goal: Interest or engagement in activities will improve Outcome: Progressing Goal: Sleeping patterns will improve Outcome: Progressing   Problem: Role Relationship: Goal: Ability to interact with others will improve Outcome: Progressing   Problem: Self-Concept: Goal: Level of anxiety will decrease Outcome: Progressing

## 2023-03-23 NOTE — Group Note (Signed)
Date:  03/23/2023 Time:  9:48 AM  Group Topic/Focus:  Goals Group:   The focus of this group is to help patients establish daily goals to achieve during treatment and discuss how the patient can incorporate goal setting into their daily lives to aide in recovery. Orientation:   The focus of this group is to educate the patient on the purpose and policies of crisis stabilization and provide a format to answer questions about their admission.  The group details unit policies and expectations of patients while admitted.    Participation Level:  Did Not Attend  Additional Comments:  Pt chose not to attend group.   Donell Beers 03/23/2023, 9:48 AM

## 2023-03-23 NOTE — Plan of Care (Signed)
   Problem: Education: Goal: Mental status will improve Outcome: Progressing   Problem: Activity: Goal: Interest or engagement in activities will improve Outcome: Progressing

## 2023-03-23 NOTE — Group Note (Signed)
Date:  03/23/2023 Time:  8:48 PM  Group Topic/Focus:  Wrap-Up Group:   The focus of this group is to help patients review their daily goal of treatment and discuss progress on daily workbooks.    Participation Level:  Active  Participation Quality:  Appropriate  Affect:  Appropriate  Cognitive:  Appropriate  Insight: Appropriate  Engagement in Group:  Engaged  Modes of Intervention:  Education and Exploration  Additional Comments:  Patient attended and participated in group tonight.  She reports that today her goal was to take a bath. She went to the gym and had da good time. She went for all her meals and watch movies.  Lita Mains [facility] 03/23/2023, 8:48 PM

## 2023-03-23 NOTE — Progress Notes (Signed)
   03/22/23 2020  Psych Admission Type (Psych Patients Only)  Admission Status Involuntary  Psychosocial Assessment  Patient Complaints None  Eye Contact Fair  Facial Expression Animated  Affect Preoccupied  Speech Soft  Interaction Assertive  Motor Activity Other (Comment) (wnl)  Appearance/Hygiene Improved  Behavior Characteristics Cooperative  Mood Preoccupied;Pleasant  Thought Process  Coherency Circumstantial  Content Preoccupation  Delusions Paranoid  Perception Depersonalization  Hallucination None reported or observed  Judgment Poor  Confusion Mild  Danger to Self  Current suicidal ideation? Denies

## 2023-03-24 LAB — VITAMIN B12: Vitamin B-12: 257 pg/mL (ref 180–914)

## 2023-03-24 LAB — VITAMIN D 25 HYDROXY (VIT D DEFICIENCY, FRACTURES): Vit D, 25-Hydroxy: 31.9 ng/mL (ref 30–100)

## 2023-03-24 LAB — T4, FREE: Free T4: 1.02 ng/dL (ref 0.61–1.12)

## 2023-03-24 LAB — FOLATE: Folate: 13.8 ng/mL (ref >5.9)

## 2023-03-24 LAB — TSH: TSH: <0.01 u[IU]/mL — ABNORMAL LOW (ref 0.350–4.500)

## 2023-03-24 NOTE — Progress Notes (Signed)
   03/24/23 1100  Psych Admission Type (Psych Patients Only)  Admission Status Involuntary  Psychosocial Assessment  Patient Complaints Anxiety  Eye Contact Fair  Facial Expression Anxious  Affect Preoccupied  Speech Logical/coherent  Interaction Assertive  Motor Activity Pacing  Appearance/Hygiene Unremarkable  Behavior Characteristics Cooperative  Mood Preoccupied  Thought Process  Coherency Circumstantial  Content Preoccupation  Delusions Paranoid  Perception Depersonalization  Hallucination None reported or observed  Judgment Poor  Confusion None  Danger to Self  Current suicidal ideation? Denies  Self-Injurious Behavior No self-injurious ideation or behavior indicators observed or expressed   Agreement Not to Harm Self Yes  Description of Agreement verbal  Danger to Others  Danger to Others None reported or observed

## 2023-03-24 NOTE — Plan of Care (Signed)
   Problem: Education: Goal: Emotional status will improve Outcome: Progressing Goal: Mental status will improve Outcome: Progressing   Problem: Activity: Goal: Interest or engagement in activities will improve Outcome: Progressing Goal: Sleeping patterns will improve Outcome: Progressing   Problem: Safety: Goal: Periods of time without injury will increase Outcome: Progressing   Problem: Coping: Goal: Coping ability will improve Outcome: Progressing
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
## 2023-03-24 NOTE — Group Note (Signed)
Date:  03/24/2023 Time:  10:45 PM  Group Topic/Focus:  Overcoming Stress:   The focus of this group is to define stress and help patients assess their triggers. Wrap-Up Group:   The focus of this group is to help patients review their daily goal of treatment and discuss progress on daily workbooks.    Participation Level:  Did Not Attend  Participation Quality:  Appropriate  Affect:  Appropriate  Cognitive:  Appropriate  Insight: Appropriate  Engagement in Group:  Engaged  Modes of Intervention:  Education and Exploration  Additional Comments:  Patient attended and participated in group tonight. She reports that she did self care today  Lita Mains [facility] 03/24/2023, 10:45 PM

## 2023-03-25 ENCOUNTER — Encounter (HOSPITAL_COMMUNITY): Payer: Self-pay

## 2023-03-25 LAB — RPR: RPR Ser Ql: NONREACTIVE

## 2023-03-25 MED ORDER — HYDROXYZINE HCL 25 MG PO TABS: 25.0000 mg | ORAL_TABLET | Freq: Three times a day (TID) | ORAL | Status: DC | PRN

## 2023-03-25 MED ORDER — HYDROXYZINE HCL 25 MG PO TABS
25.0000 mg | ORAL_TABLET | Freq: Every day | ORAL | Status: DC
  Administered 2023-03-25 – 2023-03-26 (×2): 25 mg via ORAL
  Filled 2023-03-25 (×4): qty 1

## 2023-03-25 NOTE — Plan of Care (Signed)
  Problem: Education: Goal: Emotional status will improve Outcome: Progressing Goal: Mental status will improve Outcome: Progressing   Problem: Activity: Goal: Interest or engagement in activities will improve Outcome: Progressing Goal: Sleeping patterns will improve Outcome: Progressing   Problem: Coping: Goal: Ability to verbalize frustrations and anger appropriately will improve Outcome: Progressing Goal: Ability to demonstrate self-control will improve Outcome: Progressing   Problem: Safety: Goal: Periods of time without injury will increase Outcome: Progressing   Problem: Education: Goal: Knowledge of the prescribed therapeutic regimen will improve Outcome: Progressing

## 2023-03-25 NOTE — Progress Notes (Signed)
   03/25/23 2115  Psych Admission Type (Psych Patients Only)  Admission Status Involuntary  Psychosocial Assessment  Patient Complaints Anxiety  Eye Contact Fair  Facial Expression Anxious;Animated  Affect Anxious  Speech Pressured  Interaction Assertive  Motor Activity Restless  Appearance/Hygiene Unremarkable  Behavior Characteristics Cooperative;Anxious  Mood Anxious;Preoccupied  Aggressive Behavior  Effect No apparent injury  Thought Process  Coherency Circumstantial  Content Preoccupation  Delusions Paranoid  Perception Depersonalization  Hallucination None reported or observed  Judgment Poor  Confusion Mild  Danger to Self  Current suicidal ideation? Denies
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
## 2023-03-25 NOTE — BH IP Treatment Plan (Signed)
Interdisciplinary Treatment and Diagnostic Plan Update  03/25/2023 Time of Session: 1200PM - UPDATE Veronica Perez MRN: [id number]  Principal Diagnosis: Bipolar I disorder, current or most recent episode manic, with psychotic features (HCC)  Secondary Diagnoses: Principal Problem:   Bipolar I disorder, current or most recent episode manic, with psychotic features (HCC) Active Problems:   Hyperthyroidism   Current Medications:  Current Facility-Administered Medications  Medication Dose Route Frequency Provider Last Rate Last Admin   acetaminophen (TYLENOL) tablet 650 mg  650 mg Oral Q6H PRN Howie Ill, NP   650 mg at 03/20/23 1930   alum & mag hydroxide-simeth (MAALOX/MYLANTA) 200-200-20 MG/5ML suspension 30 mL  30 mL Oral Q4H PRN Howie Ill, NP       haloperidol (HALDOL) tablet 5 mg  5 mg Oral TID PRN Howie Ill, NP       And   diphenhydrAMINE (BENADRYL) capsule 50 mg  50 mg Oral TID PRN Howie Ill, NP       haloperidol lactate (HALDOL) injection 5 mg  5 mg Intramuscular TID PRN Howie Ill, NP       And   diphenhydrAMINE (BENADRYL) injection 50 mg  50 mg Intramuscular TID PRN Howie Ill, NP       And   LORazepam (ATIVAN) injection 2 mg  2 mg Intramuscular TID PRN Howie Ill, NP       haloperidol lactate (HALDOL) injection 10 mg  10 mg Intramuscular TID PRN Howie Ill, NP       And   diphenhydrAMINE (BENADRYL) injection 50 mg  50 mg Intramuscular TID PRN Howie Ill, NP       And   LORazepam (ATIVAN) injection 2 mg  2 mg Intramuscular TID PRN Howie Ill, NP       hydrOXYzine (ATARAX) tablet 25 mg  25 mg Oral QHS [name], MD       magnesium hydroxide (MILK OF MAGNESIA) suspension 30 mL  30 mL Oral Daily PRN Howie Ill, NP       methimazole (TAPAZOLE) tablet 10 mg  10 mg Oral BID [name], [name], MD   10 mg at 03/25/23 1106   OLANZapine (ZYPREXA) tablet 20 mg  20 mg Oral QHS [name], [name], MD   20 mg at 03/24/23  2033   traZODone (DESYREL) tablet 50 mg  50 mg Oral QHS [name], NP   50 mg at 03/24/23 2033   PTA Medications: Medications Prior to Admission  Medication Sig Dispense Refill Last Dose/Taking   lithium carbonate (LITHOBID) 300 MG ER tablet Take 300 mg by mouth 2 (two) times daily.       Patient Stressors: Legal issue   Marital or family conflict   Medication change or noncompliance   Traumatic event    Patient Strengths: Average or above average intelligence  Communication skills  Physical Health   Treatment Modalities: Medication Management, Group therapy, Case management,  1 to 1 session with clinician, Psychoeducation, Recreational therapy.   Physician Treatment Plan for Primary Diagnosis: Bipolar I disorder, current or most recent episode manic, with psychotic features (HCC) Long Term Goal(s): Improvement in symptoms so as ready for discharge   Short Term Goals: Ability to identify and develop effective coping behaviors will improve Ability to maintain clinical measurements within normal limits will improve Compliance with prescribed medications will improve Ability to identify triggers associated with substance abuse/mental health issues will improve  Medication Management:  Evaluate patient's response, side effects, and tolerance of medication regimen.  Therapeutic Interventions: 1 to 1 sessions, Unit Group sessions and Medication administration.  Evaluation of Outcomes: Progressing  Physician Treatment Plan for Secondary Diagnosis: Principal Problem:   Bipolar I disorder, current or most recent episode manic, with psychotic features (HCC) Active Problems:   Hyperthyroidism  Long Term Goal(s): Improvement in symptoms so as ready for discharge   Short Term Goals: Ability to identify and develop effective coping behaviors will improve Ability to maintain clinical measurements within normal limits will improve Compliance with prescribed medications will  improve Ability to identify triggers associated with substance abuse/mental health issues will improve     Medication Management: Evaluate patient's response, side effects, and tolerance of medication regimen.  Therapeutic Interventions: 1 to 1 sessions, Unit Group sessions and Medication administration.  Evaluation of Outcomes: Progressing   RN Treatment Plan for Primary Diagnosis: Bipolar I disorder, current or most recent episode manic, with psychotic features (HCC) Long Term Goal(s): Knowledge of disease and therapeutic regimen to maintain health will improve  Short Term Goals: Ability to remain free from injury will improve, Ability to demonstrate self-control, Ability to verbalize feelings will improve, Ability to disclose and discuss suicidal ideas, and Compliance with prescribed medications will improve  Medication Management: RN will administer medications as ordered by provider, will assess and evaluate patient's response and provide education to patient for prescribed medication. RN will report any adverse and/or side effects to prescribing provider.  Therapeutic Interventions: 1 on 1 counseling sessions, Psychoeducation, Medication administration, Evaluate responses to treatment, Monitor vital signs and CBGs as ordered, Perform/monitor CIWA, COWS, AIMS and Fall Risk screenings as ordered, Perform wound care treatments as ordered.  Evaluation of Outcomes: Progressing   LCSW Treatment Plan for Primary Diagnosis: Bipolar I disorder, current or most recent episode manic, with psychotic features (HCC) Long Term Goal(s): Safe transition to appropriate next level of care at discharge, Engage patient in therapeutic group addressing interpersonal concerns.  Short Term Goals: Engage patient in aftercare planning with referrals and resources, Increase social support, Increase emotional regulation, Facilitate acceptance of mental health diagnosis and concerns, Identify triggers associated  with mental health/substance abuse issues, and Increase skills for wellness and recovery  Therapeutic Interventions: Assess for all discharge needs, 1 to 1 time with Social worker, Explore available resources and support systems, Assess for adequacy in community support network, Educate family and significant other(s) on suicide prevention, Complete Psychosocial Assessment, Interpersonal group therapy.  Evaluation of Outcomes: Progressing   Progress in Treatment: Attending groups: Yes. Participating in groups: Yes. Taking medication as prescribed: Yes. Toleration medication: Yes. Family/Significant other contact made: Yes, contacted: Clayborn Heron - daughter [phone] Patient understands diagnosis: No. Discussing patient identified problems/goals with staff: Yes. Medical problems stabilized or resolved: Yes. Denies suicidal/homicidal ideation: Yes. Issues/concerns per patient self-inventory: No. Other: None   New problem(s) identified: No, Describe:  None   New Short Term/Long Term Goal(s): medication stabilization, elimination of SI thoughts, development of comprehensive mental wellness plan.    Patient Goals:  "I don't know. My daughter and I need as [facility] house"   Discharge Plan or Barriers: Patient recently admitted. CSW will continue to follow and assess for appropriate referrals and possible discharge planning.      Reason for Continuation of Hospitalization: Anxiety Delusions  Mania Medication stabilization   Estimated Length of Stay: 5 Days  Last 3 [place] Suicide Severity Risk Score: Flowsheet Row [facility]10/2023 in [facility] ED from [facility]/27/2023 in [facility] Emergency Department at [facility]  C-SSRS RISK CATEGORY No Risk No Risk No Risk       Last PHQ 2/9 Scores:    11/12/2021    1:28 PM 12/26/2020   10:38 AM 05/27/2016     2:22 PM  Depression screen PHQ 2/9  Decreased Interest 0 1 3  Down, Depressed, Hopeless 0 1 3  PHQ - 2 Score 0 2 6  Altered sleeping  2 1  Tired, decreased energy  2 3  Change in appetite  0 0  Feeling bad or failure about yourself   0 0  Trouble concentrating  1 0  Moving slowly or fidgety/restless  0 0  Suicidal thoughts   0  PHQ-9 Score  7 10  Difficult doing work/chores  Somewhat difficult     Scribe for Treatment Team: Esmeralda Arthur 03/25/2023 2:48 PM
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
## 2023-03-25 NOTE — Plan of Care (Signed)
Pt presents with animated expression and anxious affect. Reports good sleep and appetite, as well as anxiety regarding discharge plans. Speech is pressured and circumstantial. Pt continues to have paranoid delusions. Denies SI, HI, AVH, and pain. Pt is cooperative and assertive in interactions with staff. Observed in the dayroom and milieu throughout the day. Medication compliant with no adverse reactions. Safety checks maintained at q15 minutes. Support, encouragement, and reassurance offered to the pt.   Problem: Education: Goal: Emotional status will improve 03/25/2023 1735 by D'Addio, Kelli Egolf C, RN Outcome: Progressing Goal: Mental status will improve 03/25/2023 1735 by D'Addio, Derrius Furtick C, RN Outcome: Progressing  Problem: Coping Goal: Ability to verbalize frustrations and anger appropriately will improve 03/25/2023 1735 by D'Addio, Marlen Mollica C, RN Outcome: Progressing  Problem: Safety: Goal: Periods of time without injury will increase 03/25/2023 1735 by D'Addio, Margorie John, RN Outcome: Progressing

## 2023-03-26 DIAGNOSIS — F312 Bipolar disorder, current episode manic severe with psychotic features: Secondary | ICD-10-CM | POA: Diagnosis not present

## 2023-03-26 LAB — T3, FREE: T3, Free: 4.6 pg/mL — ABNORMAL HIGH (ref 2.0–4.4)

## 2023-03-26 NOTE — Group Note (Signed)
Date:  03/26/2023 Time:  2:28 PM  Group Topic/Focus:  Goals Group:   The focus of this group is to help patients establish daily goals to achieve during treatment and discuss how the patient can incorporate goal setting into their daily lives to aide in recovery.    Participation Level:  Did Not Attend  Participation Quality:    Affect:    Cognitive:    Insight:   Engagement in Group:    Modes of Intervention:    Additional Comments:    Audelia Hives 03/26/2023, 2:28 PM

## 2023-03-26 NOTE — Progress Notes (Signed)
Collateral contact - Rhunette Croft (mom) [phone]  Mom said that patient has been evicted from her apartment because she had a nervous breakdown.  Mom said that she doesn't know where patient will go at discharge.  Patient will not be able to come to mom's home.  Mom said that she speaks with patient on the phone, and "she sounds better."  Mom confirmed that patient doesn't use any substances.  Lera Gaines, LCSWA 03/25/2023

## 2023-03-26 NOTE — [facility] Group Notes (Signed)
Adult Psychoeducational Group Note  Date:  03/26/2023 Time:  8:56 PM  Group Topic/Focus:  Wrap-Up Group:   The focus of this group is to help patients review their daily goal of treatment and discuss progress on daily workbooks.  Participation Level:  Active  Participation Quality:  Appropriate  Affect:  Anxious  Cognitive:  Appropriate  Insight: Good  Engagement in Group:  Engaged  Modes of Intervention:  Discussion  Additional Comments:   Pt states she's had a better day than yesterday and states that she is ready to be d/c tomorrow. Pt states her anxiety is less than normal and her depression is at a 3 today  Vevelyn Pat 03/26/2023, 8:56 PM

## 2023-03-26 NOTE — Progress Notes (Signed)
Collateral contact - Psychotherapeutic Services ([facility]) - ACTT  CSW emailed a referral to start the services, P&H, medication list, face sheet to ACTT via secure email to [email or web address] and [email or web address]     Read Drivers, LCSWA 03/26/2023

## 2023-03-26 NOTE — Plan of Care (Signed)
  Problem: Education: Goal: Emotional status will improve Outcome: Progressing Goal: Mental status will improve Outcome: Progressing   Problem: Activity: Goal: Interest or engagement in activities will improve Outcome: Progressing Goal: Sleeping patterns will improve Outcome: Progressing   Problem: Coping: Goal: Ability to demonstrate self-control will improve Outcome: Progressing   Problem: Safety: Goal: Periods of time without injury will increase Outcome: Progressing   Problem: Self-Concept: Goal: Will verbalize positive feelings about self Outcome: Progressing

## 2023-03-26 NOTE — Plan of Care (Signed)
   Problem: Education: Goal: Emotional status will improve Outcome: Progressing Goal: Mental status will improve Outcome: Progressing

## 2023-03-26 NOTE — [facility] Suicide Risk Assessment (Signed)
[facility] INPATIENT:  Family/Significant Other Suicide Prevention Education  Suicide Prevention Education:  Education Completed; Linea Calles (daughter) [phone], (name of family member/significant other) has been identified by the patient as the family member/significant other with whom the patient will be residing, and identified as the person(s) who will aid the patient in the event of a mental health crisis (suicidal ideations/suicide attempt).  With written consent from the patient, the family member/significant other has been provided the following suicide prevention education, prior to the and/or following the discharge of the patient.  Daughter stated that they don't have any guns, but patient had attempted to use a baseball bat before, believing it was for self-defense; "she used it in the wrong way" due to mental health symptoms.  Daughter said that she hid the bat. She agreed to secure medications and sharp objects (such as knives or scissors) but explained that her mom doesn't like metal, so she will only use knives for cutting vegetables. She expressed that she doesn't worry about her mom misusing medications but agreed to secure them. When asked if she had any safety concerns with patient returning home, daughter said that patient stops taking her medications, and doesn't take proper care of herself. For example, patient hurt her knee, and the wound wasn't healing properly, but she didn't seek medical attention.  Daughter said she would pick patient up tomorrow, Friday, 2/21, sometime after 3 PM but couldn't specify an exact time.  They agreed to talk about it tomorrow, Friday, 2/21. She will take patient to their apartment located at [address], [place].  The suicide prevention education provided includes the following: Suicide risk factors Suicide prevention and interventions National Suicide Hotline telephone number [facility] assessment telephone  number [facility] Emergency Assistance 911 [facility] and/or Residential Mobile Crisis Unit telephone number  Request made of family/significant other to: Remove weapons (e.g., guns, rifles, knives), all items previously/currently identified as safety concern.   Remove drugs/medications (over-the-counter, prescriptions, illicit drugs), all items previously/currently identified as a safety concern.  The family member/significant other verbalizes understanding of the suicide prevention education information provided.  The family member/significant other agrees to remove the items of safety concern listed above.  Alfredia Ferguson Bethzy Hauck, LCSWA 03/26/2023, 8:57 PM
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
## 2023-03-26 NOTE — Progress Notes (Signed)
D: Pt denies SI/HI/AHVH / Pain. Pt is pleasant and cooperative.Pt stated " she wanted to go to parents house before going to her own home".   A: Pt was offered support and encouragement. Pt was given scheduled medications. Pt was encourage to attend groups. Q 15 minute checks were done for safety.   R:Pt attends groups and interacts well with peers and staff. Pt is taking medication. Pt has no complaints.Pt receptive to treatment and safety maintained on unit.

## 2023-03-26 NOTE — Progress Notes (Signed)
   03/26/23 1100  Psych Admission Type (Psych Patients Only)  Admission Status Involuntary  Psychosocial Assessment  Patient Complaints Anxiety  Eye Contact Fair  Facial Expression Anxious  Affect Anxious  Speech Pressured  Interaction Assertive  Motor Activity Restless  Appearance/Hygiene Unremarkable  Behavior Characteristics Cooperative  Mood Preoccupied  Thought Process  Coherency Circumstantial  Content Preoccupation  Delusions Paranoid  Perception Depersonalization  Hallucination None reported or observed  Judgment Poor  Confusion None  Danger to Self  Current suicidal ideation? Denies  Danger to Others  Danger to Others None reported or observed
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
## 2023-03-26 NOTE — Group Note (Signed)
Recreation Therapy Group Note   Group Topic:Stress Management  Group Date: 03/26/2023 Start Time: 1001 End Time: 1034 Facilitators: Zen Felling-McCall, LRT,CTRS Location: [address] Dayroom   Group Topic: Stress Management  Goal Area(s) Addresses:  Patient will identify positive stress management techniques. Patient will identify how music can be used a stress reliever. Patient will identify benefits of using stress management post d/c.  Behavioral Response:   Intervention: Camera operator, Music App  Activity: LRT and patients the importance of music and how it can be used to create calm and peaceful mood and environment. Patients were then given the opportunity to pick the songs of their choosing that they felt helped them to relax or puts them in more positive moods. Patients were allowed to sing along or move around to the rhythm of the music as it played.    Education:  Stress Management, Discharge Planning.   Education Outcome: Acknowledges Education   Affect/Mood: N/A   Participation Level: Did not attend    Clinical Observations/Individualized Feedback:      Plan: Continue to engage patient in RT group sessions 2-3x/week.   Veronica Perez, LRT,CTRS 03/26/2023 11:45 AM

## 2023-03-26 NOTE — Group Note (Signed)
Occupational Therapy Group Note  Group Topic:Coping Skills  Group Date: 03/26/2023 Start Time: 1422 End Time: 1456 Facilitators: Ted Mcalpine, OT   Group Description: Group encouraged increased engagement and participation through discussion and activity focused on "Coping Ahead." Patients were split up into teams and selected a card from a stack of positive coping strategies. Patients were instructed to act out/charade the coping skill for other peers to guess and receive points for their team. Discussion followed with a focus on identifying additional positive coping strategies and patients shared how they were going to cope ahead over the weekend while continuing hospitalization stay.  Therapeutic Goal(s): Identify positive vs negative coping strategies. Identify coping skills to be used during hospitalization vs coping skills outside of hospital/at home Increase participation in therapeutic group environment and promote engagement in treatment   Participation Level: Engaged   Participation Quality: Independent   Behavior: Appropriate   Speech/Thought Process: Loose association    Affect/Mood: Flat   Insight: Fair   Judgement: Fair      Modes of Intervention: Education  Patient Response to Interventions:  Attentive   Plan: Continue to engage patient in OT groups 2 - 3x/week.  03/26/2023  Ted Mcalpine, OT Kerrin Champagne, OT

## 2023-03-27 MED ORDER — HYDROXYZINE HCL 25 MG PO TABS: 25.0000 mg | ORAL_TABLET | Freq: Every day | ORAL | 0 refills | Status: AC

## 2023-03-27 MED ORDER — METHIMAZOLE 10 MG PO TABS: 10.0000 mg | ORAL_TABLET | Freq: Two times a day (BID) | ORAL | 0 refills | Status: AC

## 2023-03-27 MED ORDER — OLANZAPINE 20 MG PO TABS: 20.0000 mg | ORAL_TABLET | Freq: Every day | ORAL | 0 refills | Status: AC

## 2023-03-27 NOTE — Progress Notes (Signed)
Veronica Perez  D/C'd Home per MD order.  Discussed with the patient and all questions fully answered.   An After Visit Summary was printed and given to the patient. Patient received prescription.  D/c education completed with patient/family including follow up instructions, medication list, d/c activities limitations if indicated, with other d/c instructions as indicated by MD - patient able to verbalize understanding, all questions fully answered.   Patient instructed to return to ED, call [phone], or call MD for any changes in condition.   Patient escorted to the main entrance, and D/C home via private auto.  Veronica Perez 03/27/2023 5:01 PM

## 2023-03-27 NOTE — Plan of Care (Signed)
  Problem: Education: Goal: Knowledge of [facility] Education information/materials will improve Outcome: Completed/Met Goal: Emotional status will improve Outcome: Completed/Met Goal: Mental status will improve Outcome: Completed/Met Goal: Verbalization of understanding the information provided will improve Outcome: Completed/Met   Problem: Activity: Goal: Interest or engagement in activities will improve Outcome: Completed/Met Goal: Sleeping patterns will improve Outcome: Completed/Met   Problem: Coping: Goal: Ability to verbalize frustrations and anger appropriately will improve Outcome: Completed/Met Goal: Ability to demonstrate self-control will improve Outcome: Completed/Met   Problem: Health Behavior/Discharge Planning: Goal: Identification of resources available to assist in meeting health care needs will improve Outcome: Completed/Met Goal: Compliance with treatment plan for underlying cause of condition will improve Outcome: Completed/Met   Problem: Physical Regulation: Goal: Ability to maintain clinical measurements within normal limits will improve Outcome: Completed/Met   Problem: Safety: Goal: Periods of time without injury will increase Outcome: Completed/Met   Problem: Activity: Goal: Will verbalize the importance of balancing activity with adequate rest periods Outcome: Completed/Met   Problem: Education: Goal: Will be free of psychotic symptoms Outcome: Completed/Met Goal: Knowledge of the prescribed therapeutic regimen will improve Outcome: Completed/Met   Problem: Coping: Goal: Coping ability will improve Outcome: Completed/Met Goal: Will verbalize feelings Outcome: Completed/Met   Problem: Health Behavior/Discharge Planning: Goal: Compliance with prescribed medication regimen will improve Outcome: Completed/Met   Problem: Nutritional: Goal: Ability to achieve adequate nutritional intake will improve Outcome: Completed/Met    Problem: Role Relationship: Goal: Ability to communicate needs accurately will improve Outcome: Completed/Met Goal: Ability to interact with others will improve Outcome: Completed/Met   Problem: Safety: Goal: Ability to redirect hostility and anger into socially appropriate behaviors will improve Outcome: Completed/Met Goal: Ability to remain free from injury will improve Outcome: Completed/Met   Problem: Self-Care: Goal: Ability to participate in self-care as condition permits will improve Outcome: Completed/Met   Problem: Self-Concept: Goal: Will verbalize positive feelings about self Outcome: Completed/Met   Problem: Activity: Goal: Will identify at least one activity in which they can participate Outcome: Completed/Met   Problem: Coping: Goal: Ability to identify and develop effective coping behavior will improve Outcome: Completed/Met Goal: Ability to interact with others will improve Outcome: Completed/Met Goal: Demonstration of participation in decision-making regarding own care will improve Outcome: Completed/Met Goal: Ability to use eye contact when communicating with others will improve Outcome: Completed/Met   Problem: Health Behavior/Discharge Planning: Goal: Identification of resources available to assist in meeting health care needs will improve Outcome: Completed/Met   Problem: Self-Concept: Goal: Will verbalize positive feelings about self Outcome: Completed/Met   Problem: Education: Goal: Ability to state activities that reduce stress will improve Outcome: Completed/Met   Problem: Coping: Goal: Ability to identify and develop effective coping behavior will improve Outcome: Completed/Met   Problem: Self-Concept: Goal: Ability to identify factors that promote anxiety will improve Outcome: Completed/Met Goal: Level of anxiety will decrease Outcome: Completed/Met Goal: Ability to modify response to factors that promote anxiety will  improve Outcome: Completed/Met

## 2023-03-27 NOTE — BHH Group Notes (Signed)
Adult Psychoeducational Group Note  Date:  03/27/2023 Time:  1:02 PM  Group Topic/Focus:  Goals Group:   The focus of this group is to help patients establish daily goals to achieve during treatment and discuss how the patient can incorporate goal setting into their daily lives to aide in recovery. Orientation:   The focus of this group is to educate the patient on the purpose and policies of crisis stabilization and provide a format to answer questions about their admission.  The group details unit policies and expectations of patients while admitted.  Participation Level:  Did Not Attend  Participation Quality:    Affect:    Cognitive:    Insight:   Engagement in Group:    Modes of Intervention:    Additional Comments:    Sheran Lawless 03/27/2023, 1:02 PM

## 2023-03-27 NOTE — Progress Notes (Signed)
  [facility] Adult Case Management Discharge Plan :  Will you be returning to the same living situation after discharge:  Yes,  patient will return to her apartment located at [address], [place] At discharge, do you have transportation home?: Yes,  Yamilette Garretson (daughter) [phone] will pick her up at 3 PM   Do you have the ability to pay for your medications: Yes,  patient has insurance  Release of information consent forms completed and in the chart;  Patient's signature needed at discharge.  Patient to Follow up at:  Follow-up Information     [facility]. Go on 04/09/2023.   Specialty: Behavioral Health Why: You have an appointment on 04/09/23 at 1:30 pm for medication management services, in person. You also have an appointment on 05/26/23 at 10:00 am for therapy services, in person (always). Contact information: [address] [place] [address] [phone]         Psychotherapeutic Services Assertive Community Treatment Team [facility]) Follow up.   Why: A referral was sent on 03/26/2023.  Please contact the provider on 03/30/2023 at 9 AM for an update on the status of the referral. Contact information: [facility] Building [address] [place], [place] [address] Phone:  [phone]                Next level of care provider has access to [facility] Link:no  Safety Planning and Suicide Prevention discussed: Yes,  Jontavia Leatherbury (daughter) [phone] (patient lives with her daughter)  Has patient been referred to the Quitline?: Patient does not use tobacco/nicotine products  Patient has been referred for addiction treatment: No known substance use disorder.  Alannie Amodio O Kinzlie Harney, LCSWA 03/27/2023, 2:22 PM

## 2023-03-27 NOTE — BHH Suicide Risk Assessment (Signed)
Suicide Risk Assessment  Discharge Assessment    [facility] Discharge Suicide Risk Assessment   Principal Problem: Bipolar I disorder, current or most recent episode manic, with psychotic features [facility]) Discharge Diagnoses: Principal Problem:   Bipolar I disorder, current or most recent episode manic, with psychotic features (HCC) Active Problems:   Hyperthyroidism   Total Time spent with patient: 31 minutes  48 year old Caucasian female, lives with her 42 year old daughter. History of thyrotoxicosis and schizoaffective disorder bipolar type. Involuntarily committed on account of worsening psychosis. Preoccupied with belief that staff members at her apartment with her trying to kill her and her daughter. She has had these delusions for a long time.   Hospital Course  During the patient's hospitalization, patient had extensive initial psychiatric evaluation, and follow-up psychiatric evaluations every day.   Psychiatric diagnoses provided upon initial assessment: Principal Problem:   Bipolar I disorder, current or most recent episode manic, with psychotic features (HCC) Active Problems:   Hyperthyroidism    The following medications were managed:   Upon admission, the following medications were changed / started / discontinued: Bipolar Disorder, severe with Psychosis: -Continue Zyprexa 10 mg QHS for mood stability and psychosis -Continue Agitation Protocol: Haldol/Ativan/Benadryl     Hyperthyroidism: -Continue Methimazole 10 mg BID   During the patient's stay, the following medications were changed / started / discontinued, with final adjustments by discharge: -- Zyprexa increased to 20 mg at bedtime -- D/c As needed lorazepam 0.5 mg twice daily - Atarax 25 at bedtime was started   During the hospitalization, patient had the following lab / imaging / testing abnormalities which require further evaluation / management / treatment: high T3   Patient's care was discussed during the  interdisciplinary team meeting every day during the hospitalization.   The patient denies any side effects to prescribed psychiatric medication.   Gradually, patient started adjusting to milieu. The patient was evaluated each day by a clinical provider to ascertain response to treatment. Improvement was noted by the patient's report of decreasing symptoms, improved sleep and appetite, affect, medication tolerance, behavior, and participation in unit programming.  Patient was asked each day to complete a self inventory noting mood, mental status, pain, new symptoms, anxiety and concerns.     Symptoms were reported as significantly decreased or resolved completely by discharge.    On day of discharge, the patient reports that their mood is stable. The patient denied having suicidal thoughts for more than 48 hours prior to discharge.  Patient denies having homicidal thoughts.  Patient denies having auditory hallucinations.  Patient denies any visual hallucinations or other symptoms of psychosis. The patient was motivated to continue taking medication with a goal of continued improvement in mental health.    The patient reports their target psychiatric symptoms of delusions responded well to the psychiatric medications, and the patient reports overall benefit other psychiatric hospitalization. Supportive psychotherapy was provided to the patient. The patient also participated in regular group therapy while hospitalized. Coping skills, problem solving as well as relaxation therapies were also part of the unit programming.   Labs were reviewed with the patient, and abnormal results were discussed with the patient.   The patient is able to verbalize their individual safety plan to this provider.   Behavioral Events: none   Restraints: none   # It is recommended to the patient to continue psychiatric medications as prescribed, after discharge from the hospital.     # It is recommended to the patient to  follow up  with your outpatient psychiatric provider and PCP.   # It was discussed with the patient, the impact of alcohol, drugs, tobacco have been there overall psychiatric and medical wellbeing, and total abstinence from substance use was recommended to the patient.   # Prescriptions provided or sent directly to preferred pharmacy at discharge. Patient agreeable to plan. Given opportunity to ask questions. Appears to feel comfortable with discharge.    # In the event of worsening symptoms, the patient is instructed to call the crisis hotline, 911 and or go to the nearest ED for appropriate evaluation and treatment of symptoms. To follow-up with primary care provider for other medical issues, concerns and or health care needs   # Patient was discharged home with a plan to follow up as noted below.   Physical Findings: AIMS:  , ,  ,  ,    CIWA:    COWS:      Mental Status Exam: General Appearance: appears at stated age, casually dressed and groomed    Behavior: tired, cooperative   Psychomotor Activity: no psychomotor agitation or retardation noted    Eye Contact: fair  Speech: normal amount, tone, volume and fluency      Mood: euthymic  Affect: congruent, pleasant and interactive    Thought Process: linear, goal directed, no circumstantial or tangential thought process noted, no racing thoughts or flight of ideas  Descriptions of Associations: intact  Thought Content: no bizarre content, logical and future-oriented  Hallucinations: denies AH, VH , does not appear responding to stimuli  Delusions: no paranoia, delusions of control, grandeur, ideas of reference, thought broadcasting, and magical thinking  Suicidal Thoughts: denies SI, intention, plan  Homicidal Thoughts: denies HI, intention, plan    Alertness/Orientation: alert and fully oriented    Insight: limited Judgment: fair, improved    Memory: intact    Executive Functions  Concentration: intact  Attention Span:  fair  Recall: intact  Fund of Knowledge: fair      Physical Exam  General: Pleasant, well-appearing. No acute distress. Pulmonary: Normal effort. No wheezing or rales. Skin: No obvious rash or lesions. Neuro: A&Ox3.No focal deficit.     Review of Systems  No reported symptoms Blood pressure 108/62, pulse 73, temperature 98.6 F (37 C), temperature source Oral, resp. rate 18, height 5\' 4"  (1.626 m), weight 83.3 kg, SpO2 100%. Body mass index is 31.51 kg/m.  Mental Status Per Nursing Assessment::   On Admission:  NA  Demographic Factors:  Caucasian Loss Factors: Financial problems/change in socioeconomic status Historical Factors: Impulsivity Risk Reduction Factors:   Sense of responsibility to family and Living with another person, especially a relative   Continued Clinical Symptoms:  Bipolar Disorder:   Depressive phase  Cognitive Features That Contribute To Risk:  Closed-mindedness    Suicide Risk:  Mild: There are no identifiable suicide plans, no associated intent, mild dysphoria and related symptoms, good self-control (both objective and subjective assessment), few other risk factors, and identifiable protective factors, including available and accessible social support.   Follow-up Information     [facility]. Go on 04/09/2023.   Specialty: Behavioral Health Why: You have an appointment on 04/09/23 at 1:30 pm for medication management services, in person. You also have an appointment on 05/26/23 at 10:00 am for therapy services, in person (always). Contact information: [address] [place] [address] [phone]         Psychotherapeutic Services Assertive Community Treatment Team [facility]) Follow up.  Why: A referral was sent on 03/26/2023.  Please contact the provider on 03/30/2023 at 9 AM for an update on the status of the referral. Contact information: [facility] Building [address] [place], [place]  [address] Phone:  [phone]                Plan Of Care/Follow-up recommendations:  Activity: as tolerated   Diet: heart healthy   # It is recommended to the patient to continue psychiatric medications as prescribed, after discharge from the hospital.     # It is recommended to the patient to follow up with your outpatient psychiatric provider -instructions on appointment date, time, and address (location) are provided to you in discharge paperwork   # Follow-up with outpatient primary care doctor and other specialists -for management of chronic medical disease, including: hyperthyroidism   # Testing: Follow-up with outpatient provider for abnormal lab results: high T3   # It was discussed with the patient, the impact of alcohol, drugs, tobacco have been there overall psychiatric and medical wellbeing, and total abstinence from substance use was recommended to the patient.   # Prescriptions provided or sent directly to preferred pharmacy at discharge. Patient agreeable to plan. Given opportunity to ask questions. Appears to feel comfortable with discharge.    # In the event of worsening symptoms, the patient is instructed to call the crisis hotline, 911, and or go to the nearest ED for appropriate evaluation and treatment of symptoms. To follow-up with primary care provider for other medical issues, concerns and or health care needs   Patient agrees with D/C instructions and plan.    I discussed my assessment, planned testing and intervention for the patient with Dr. Jimmey Ralph who agrees with my formulated course of action.   Lance Muss, MD 03/27/2023, 9:26 AM

## 2023-03-27 NOTE — Progress Notes (Signed)
Recreation Therapy Notes  INPATIENT RECREATION TR PLAN  Patient Details Name: Veronica Perez MRN: [id number] DOB: 08-16-1975 Today's Date: 03/27/2023  Rec Therapy Plan Is patient appropriate for Therapeutic Recreation?: Yes Treatment times per week: about 3 days Estimated Length of Stay: 5-7 days TR Treatment/Interventions: Group participation (Comment)  Discharge Criteria Pt will be discharged from therapy if:: Discharged Treatment plan/goals/alternatives discussed and agreed upon by:: Patient/family  Discharge Summary Short term goals set: See patient care plan Short term goals met: Other (Comment) (Progressing) Progress toward goals comments: Groups attended Which groups?: Coping skills Reason goals not met: Patient didn't attend enough groups to show adequate progression. Therapeutic equipment acquired: N/A Reason patient discharged from therapy: Discharge from hospital Pt/family agrees with progress & goals achieved: Yes Date patient discharged from therapy: 03/27/23   Norris Bodley-McCall, LRT,CTRS Mccoy Testa A Babbie Dondlinger-McCall 03/27/2023, 12:58 PM

## 2023-03-27 NOTE — Plan of Care (Signed)
Patient attended one recreation therapy group session in which pt was able to focus on group task/topic with some prompts. Patient needed to attend more groups to show adequate progression in completing goal.    Veronica Perez, LRT,CTRS
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
## 2023-03-27 NOTE — BHH Group Notes (Signed)
Spiritual care group facilitated by Chaplain Dyanne Carrel, [facility]  Group focused on topic of strength. Group members reflected on what thoughts and feelings emerge when they hear this topic. They then engaged in facilitated dialog around how strength is present in their lives. This dialog focused on representing what strength had been to them in their lives (images and patterns given) and what they saw as helpful in their life now (what they needed / wanted).  Activity drew on narrative framework.  Patient Progress: Veronica Perez attended group and actively engaged and participated in group conversation and activities.  Comments were on topic and appropriate for group context.

## 2023-03-27 NOTE — Group Note (Signed)
Recreation Therapy Group Note   Group Topic:Team Building  Group Date: 03/27/2023 Start Time: 1005 End Time: 1023 Facilitators: Karuna Balducci-McCall, LRT,CTRS Location: [address] Dayroom   Group Topic: Communication, Team Building, Problem Solving  Goal Area(s) Addresses:  Patient will effectively work with peer towards shared goal.  Patient will identify skills used to make activity successful.  Patient will identify how skills used during activity can be used to reach post d/c goals.   Intervention: STEM Activity  Activity: Stage manager. In teams of 3-5, patients were given 12 plastic drinking straws and an equal length of masking tape. Using the materials provided, patients were asked to build a landing pad to catch a golf ball dropped from approximately 5 feet in the air. All materials were required to be used by the team in their design. LRT facilitated post-activity discussion.  Education: Pharmacist, community, Scientist, physiological, Discharge Planning   Education Outcome: Acknowledges education/In group clarification offered/Needs additional education.    Affect/Mood: N/A   Participation Level: Did not attend    Clinical Observations/Individualized Feedback:      Plan: Continue to engage patient in RT group sessions 2-3x/week.   Vernecia Umble-McCall, LRT,CTRS 03/27/2023 12:43 PM

## 2023-04-08 ENCOUNTER — Encounter (HOSPITAL_COMMUNITY): Payer: Self-pay

## 2023-04-08 ENCOUNTER — Ambulatory Visit (HOSPITAL_COMMUNITY)
Admission: EM | Admit: 2023-04-08 | Discharge: 2023-04-08 | Disposition: A | Discharge status: Home / Self Care | Attending: Physician Assistant | Admitting: Physician Assistant

## 2023-04-08 DIAGNOSIS — T2037XA Burn of third degree of neck, initial encounter: Secondary | ICD-10-CM | POA: Diagnosis not present

## 2023-04-08 MED ORDER — MUPIROCIN 2 % EX OINT: 1.0000 | TOPICAL_OINTMENT | Freq: Two times a day (BID) | CUTANEOUS | 0 refills | Status: DC

## 2023-04-08 NOTE — ED Notes (Signed)
 Called x 1 to be roomed. No answer.

## 2023-04-08 NOTE — Discharge Instructions (Signed)
 Keep this area clean with soap and water.  Apply Bactroban ointment twice daily.  If there are any signs of infection including redness, pain, drainage, fever please return for reevaluation.  Follow-up with your primary care next week to ensure that this is healing appropriately.
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
## 2023-04-20 ENCOUNTER — Encounter (HOSPITAL_COMMUNITY): Payer: Self-pay | Admitting: Emergency Medicine

## 2023-04-20 ENCOUNTER — Ambulatory Visit (HOSPITAL_COMMUNITY): Admission: EM | Admit: 2023-04-20 | Discharge: 2023-04-20 | Disposition: A | Discharge status: Home / Self Care

## 2023-04-20 ENCOUNTER — Other Ambulatory Visit: Payer: Self-pay

## 2023-04-20 DIAGNOSIS — T2037XD Burn of third degree of neck, subsequent encounter: Secondary | ICD-10-CM | POA: Diagnosis not present

## 2023-04-20 MED ORDER — MUPIROCIN 2 % EX OINT
1.0000 | TOPICAL_OINTMENT | Freq: Two times a day (BID) | CUTANEOUS | 0 refills | Status: AC
Start: 2023-04-20 — End: ?
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
## 2023-04-20 NOTE — Discharge Instructions (Signed)
 Continue cleaning the area twice daily with soap and water.  Apply a thin layer of the mupirocin ointment twice daily.  When you are at home, leave the area open to air.  Seek care if symptoms do not improve with treatment.

## 2023-04-21 ENCOUNTER — Telehealth (HOSPITAL_COMMUNITY): Payer: Self-pay | Admitting: *Deleted

## 2023-04-21 NOTE — Telephone Encounter (Signed)
 Pt called stating that she can not get cream sent in. I called pharmacy they state that they have advised pt multiple times she can't fill this rx until 04/27/23 with her insurance but she can cash pay it per pharmacy it is about $20. Pt states she will just wait until 04/27/23 and get the cream them.
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
